# Patient Record
Sex: Female | Born: 1940 | Hispanic: No | Smoking: Never smoker
Health system: Southern US, Community
[De-identification: ages and names within clinical notes are randomized; demographics above are authoritative.]

## PROBLEM LIST (undated history)

## (undated) DIAGNOSIS — K5730 Diverticulosis of large intestine without perforation or abscess without bleeding: Secondary | ICD-10-CM

## (undated) DIAGNOSIS — E039 Hypothyroidism, unspecified: Secondary | ICD-10-CM

## (undated) DIAGNOSIS — F419 Anxiety disorder, unspecified: Secondary | ICD-10-CM

## (undated) DIAGNOSIS — J441 Chronic obstructive pulmonary disease with (acute) exacerbation: Secondary | ICD-10-CM

## (undated) DIAGNOSIS — M1990 Unspecified osteoarthritis, unspecified site: Secondary | ICD-10-CM

## (undated) DIAGNOSIS — I5189 Other ill-defined heart diseases: Secondary | ICD-10-CM

## (undated) DIAGNOSIS — I728 Aneurysm of other specified arteries: Secondary | ICD-10-CM

## (undated) DIAGNOSIS — R0989 Other specified symptoms and signs involving the circulatory and respiratory systems: Secondary | ICD-10-CM

## (undated) DIAGNOSIS — E119 Type 2 diabetes mellitus without complications: Secondary | ICD-10-CM

## (undated) DIAGNOSIS — E871 Hypo-osmolality and hyponatremia: Secondary | ICD-10-CM

## (undated) DIAGNOSIS — I69391 Dysphagia following cerebral infarction: Secondary | ICD-10-CM

## (undated) DIAGNOSIS — J349 Unspecified disorder of nose and nasal sinuses: Secondary | ICD-10-CM

## (undated) DIAGNOSIS — R1312 Dysphagia, oropharyngeal phase: Secondary | ICD-10-CM

## (undated) DIAGNOSIS — E079 Disorder of thyroid, unspecified: Secondary | ICD-10-CM

## (undated) DIAGNOSIS — I639 Cerebral infarction, unspecified: Secondary | ICD-10-CM

## (undated) DIAGNOSIS — I701 Atherosclerosis of renal artery: Secondary | ICD-10-CM

## (undated) DIAGNOSIS — I10 Essential (primary) hypertension: Secondary | ICD-10-CM

## (undated) DIAGNOSIS — E876 Hypokalemia: Secondary | ICD-10-CM

## (undated) DIAGNOSIS — G43909 Migraine, unspecified, not intractable, without status migrainosus: Secondary | ICD-10-CM

## (undated) DIAGNOSIS — E785 Hyperlipidemia, unspecified: Secondary | ICD-10-CM

## (undated) HISTORY — DX: Atherosclerosis of renal artery: I70.1

## (undated) HISTORY — DX: Anxiety disorder, unspecified: F41.9

## (undated) HISTORY — DX: Unspecified osteoarthritis, unspecified site: M19.90

## (undated) HISTORY — DX: Aneurysm of other specified arteries: I72.8

## (undated) HISTORY — DX: Cerebral infarction, unspecified: I63.9

## (undated) HISTORY — DX: Migraine, unspecified, not intractable, without status migrainosus: G43.909

## (undated) HISTORY — PX: CHOLECYSTECTOMY: SHX55

## (undated) HISTORY — DX: Other specified symptoms and signs involving the circulatory and respiratory systems: R09.89

## (undated) HISTORY — DX: Hypokalemia: E87.6

## (undated) HISTORY — DX: Hypothyroidism, unspecified: E03.9

## (undated) HISTORY — DX: Type 2 diabetes mellitus without complications: E11.9

## (undated) HISTORY — DX: Hypo-osmolality and hyponatremia: E87.1

## (undated) HISTORY — DX: Chronic obstructive pulmonary disease with (acute) exacerbation: J44.1

## (undated) HISTORY — PX: TONSILLECTOMY AND ADENOIDECTOMY: SHX28

## (undated) HISTORY — PX: COLONOSCOPY: SHX174

## (undated) HISTORY — DX: Dysphagia, oropharyngeal phase: R13.12

## (undated) HISTORY — DX: Essential (primary) hypertension: I10

## (undated) HISTORY — DX: Unspecified disorder of nose and nasal sinuses: J34.9

## (undated) HISTORY — DX: Dysphagia following cerebral infarction: I69.391

## (undated) HISTORY — DX: Diverticulosis of large intestine without perforation or abscess without bleeding: K57.30

## (undated) HISTORY — DX: Disorder of thyroid, unspecified: E07.9

## (undated) HISTORY — DX: Hyperlipidemia, unspecified: E78.5

## (undated) HISTORY — DX: Other ill-defined heart diseases: I51.89

## (undated) HISTORY — PX: CATARACT EXTRACTION: SUR2

## (undated) HISTORY — PX: EYE SURGERY: SHX253

## (undated) HISTORY — PX: BREAST SURGERY: SHX581

## (undated) HISTORY — PX: WISDOM TOOTH EXTRACTION: SHX21

---

## 2005-02-15 HISTORY — PX: BREAST REDUCTION SURGERY: SHX8

## 2013-01-25 ENCOUNTER — Ambulatory Visit: Payer: Self-pay

## 2013-03-01 ENCOUNTER — Ambulatory Visit (INDEPENDENT_AMBULATORY_CARE_PROVIDER_SITE_OTHER): Payer: Medicare Other

## 2013-03-01 VITALS — BP 161/79 | HR 65 | Resp 16 | Ht 62.0 in | Wt 138.0 lb

## 2013-03-01 DIAGNOSIS — E1149 Type 2 diabetes mellitus with other diabetic neurological complication: Secondary | ICD-10-CM

## 2013-03-01 DIAGNOSIS — E1142 Type 2 diabetes mellitus with diabetic polyneuropathy: Secondary | ICD-10-CM

## 2013-03-01 DIAGNOSIS — E1140 Type 2 diabetes mellitus with diabetic neuropathy, unspecified: Secondary | ICD-10-CM

## 2013-03-01 DIAGNOSIS — L608 Other nail disorders: Secondary | ICD-10-CM

## 2013-03-01 NOTE — Progress Notes (Signed)
   Subjective:    Patient ID: Brandy Flowers, female    DOB: 04/06/1940, 73 y.o.   MRN: [id number] Pt present for debridement of B/L 1-5 toenails. HPI    Review of Systems no other new problems. Brandy Flowers difficulties patient is having some difficulty with her blood pressure did changes in medications to     Objective:   Physical Exam Neurovascular status is intact and stable as follows pedal pulses DP +2/4 bilateral PT thready or absent bilateral absent hair growth and the skin texture and turgor noted neurologically epicritic and proprioceptive sensations are intact although diminished on Semmes Weinstein testing to the plantar forefoot and distal digits. There is normal plantar response DTRs not listed. Nails criptotic incurvated and somewhat friable there is no secondary infection no open wounds or ulcerations are noted digits are rectus no significant bunion deformity noted no open wounds or ulcers       Assessment & Plan:  Assessment diabetes diet-controlled with mild neuropathy. Multiple dystrophic criptotic nails debridement presence of diabetes and complications return for future palliative care is needed maintain accommodative shoes as instructed next  Alvan Dameichard Vail Vuncannon DPM

## 2013-03-01 NOTE — Patient Instructions (Signed)
Diabetes and Foot Care Diabetes may cause you to have problems because of poor blood supply (circulation) to your feet and legs. This may cause the skin on your feet to become thinner, break easier, and heal more slowly. Your skin may become dry, and the skin may peel and crack. You may also have nerve damage in your legs and feet causing decreased feeling in them. You may not notice minor injuries to your feet that could lead to infections or more serious problems. Taking care of your feet is one of the most important things you can do for yourself.  HOME CARE INSTRUCTIONS  Wear shoes at all times, even in the house. Do not go barefoot. Bare feet are easily injured.  Check your feet daily for blisters, cuts, and redness. If you cannot see the bottom of your feet, use a mirror or ask someone for help.  Wash your feet with warm water (do not use hot water) and mild soap. Then pat your feet and the areas between your toes until they are completely dry. Do not soak your feet as this can dry your skin.  Apply a moisturizing lotion or petroleum jelly (that does not contain alcohol and is unscented) to the skin on your feet and to dry, brittle toenails. Do not apply lotion between your toes.  Trim your toenails straight across. Do not dig under them or around the cuticle. File the edges of your nails with an emery board or nail file.  Do not cut corns or calluses or try to remove them with medicine.  Wear clean socks or stockings every day. Make sure they are not too tight. Do not wear knee-high stockings since they may decrease blood flow to your legs.  Wear shoes that fit properly and have enough cushioning. To break in new shoes, wear them for just a few hours a day. This prevents you from injuring your feet. Always look in your shoes before you put them on to be sure there are no objects inside.  Do not cross your legs. This may decrease the blood flow to your feet.  If you find a minor scrape,  cut, or break in the skin on your feet, keep it and the skin around it clean and dry. These areas may be cleansed with mild soap and water. Do not cleanse the area with peroxide, alcohol, or iodine.  When you remove an adhesive bandage, be sure not to damage the skin around it.  If you have a wound, look at it several times a day to make sure it is healing.  Do not use heating pads or hot water bottles. They may burn your skin. If you have lost feeling in your feet or legs, you may not know it is happening until it is too late.  Make sure your health care provider performs a complete foot exam at least annually or more often if you have foot problems. Report any cuts, sores, or bruises to your health care provider immediately. SEEK MEDICAL CARE IF:   You have an injury that is not healing.  You have cuts or breaks in the skin.  You have an ingrown nail.  You notice redness on your legs or feet.  You feel burning or tingling in your legs or feet.  You have pain or cramps in your legs and feet.  Your legs or feet are numb.  Your feet always feel cold. SEEK IMMEDIATE MEDICAL CARE IF:   There is increasing redness,   swelling, or pain in or around a wound.  There is a red line that goes up your leg.  Pus is coming from a wound.  You develop a fever or as directed by your health care provider.  You notice a bad smell coming from an ulcer or wound. Document Released: 01/30/2000 Document Revised: 10/04/2012 Document Reviewed: 07/11/2012 Exit[employer] Patient Information 2014 [employer].  

## 2013-05-31 ENCOUNTER — Ambulatory Visit (INDEPENDENT_AMBULATORY_CARE_PROVIDER_SITE_OTHER): Payer: Medicare Other

## 2013-05-31 VITALS — BP 149/71 | HR 60 | Resp 18

## 2013-05-31 DIAGNOSIS — L608 Other nail disorders: Secondary | ICD-10-CM

## 2013-05-31 DIAGNOSIS — E1140 Type 2 diabetes mellitus with diabetic neuropathy, unspecified: Secondary | ICD-10-CM

## 2013-05-31 DIAGNOSIS — E1149 Type 2 diabetes mellitus with other diabetic neurological complication: Secondary | ICD-10-CM

## 2013-05-31 NOTE — Patient Instructions (Signed)
Diabetes and Foot Care Diabetes may cause you to have problems because of poor blood supply (circulation) to your feet and legs. This may cause the skin on your feet to become thinner, break easier, and heal more slowly. Your skin may become dry, and the skin may peel and crack. You may also have nerve damage in your legs and feet causing decreased feeling in them. You may not notice minor injuries to your feet that could lead to infections or more serious problems. Taking care of your feet is one of the most important things you can do for yourself.  HOME CARE INSTRUCTIONS  Wear shoes at all times, even in the house. Do not go barefoot. Bare feet are easily injured.  Check your feet daily for blisters, cuts, and redness. If you cannot see the bottom of your feet, use a mirror or ask someone for help.  Wash your feet with warm water (do not use hot water) and mild soap. Then pat your feet and the areas between your toes until they are completely dry. Do not soak your feet as this can dry your skin.  Apply a moisturizing lotion or petroleum jelly (that does not contain alcohol and is unscented) to the skin on your feet and to dry, brittle toenails. Do not apply lotion between your toes.  Trim your toenails straight across. Do not dig under them or around the cuticle. File the edges of your nails with an emery board or nail file.  Do not cut corns or calluses or try to remove them with medicine.  Wear clean socks or stockings every day. Make sure they are not too tight. Do not wear knee-high stockings since they may decrease blood flow to your legs.  Wear shoes that fit properly and have enough cushioning. To break in new shoes, wear them for just a few hours a day. This prevents you from injuring your feet. Always look in your shoes before you put them on to be sure there are no objects inside.  Do not cross your legs. This may decrease the blood flow to your feet.  If you find a minor scrape,  cut, or break in the skin on your feet, keep it and the skin around it clean and dry. These areas may be cleansed with mild soap and water. Do not cleanse the area with peroxide, alcohol, or iodine.  When you remove an adhesive bandage, be sure not to damage the skin around it.  If you have a wound, look at it several times a day to make sure it is healing.  Do not use heating pads or hot water bottles. They may burn your skin. If you have lost feeling in your feet or legs, you may not know it is happening until it is too late.  Make sure your health care provider performs a complete foot exam at least annually or more often if you have foot problems. Report any cuts, sores, or bruises to your health care provider immediately. SEEK MEDICAL CARE IF:   You have an injury that is not healing.  You have cuts or breaks in the skin.  You have an ingrown nail.  You notice redness on your legs or feet.  You feel burning or tingling in your legs or feet.  You have pain or cramps in your legs and feet.  Your legs or feet are numb.  Your feet always feel cold. SEEK IMMEDIATE MEDICAL CARE IF:   There is increasing redness,   swelling, or pain in or around a wound.  There is a red line that goes up your leg.  Pus is coming from a wound.  You develop a fever or as directed by your health care provider.  You notice a bad smell coming from an ulcer or wound. Document Released: 01/30/2000 Document Revised: 10/04/2012 Document Reviewed: 07/11/2012 Exit[employer] Patient Information 2014 [employer].  

## 2013-05-31 NOTE — Progress Notes (Signed)
   Subjective:    Patient ID: Brandy Flowers, female    DOB: 10/14/1940, 73 y.o.   MRN: [id number]  HPI I am here to get my toenails trimmed up and to have my feet checked and I did have gall bladder surgery last wednesday    Review of Systems no systemic changes or findings noted    Objective:   Physical Exam Function objective findings as follows vascular status reveals pedal pulses DP +2 PT plus one over 4 bilateral refill time 3 seconds epicritic and proprioceptive sensations diminished on Semmes Weinstein testing. Nails criptotic incurvated and brittle tender no open wounds ulcerations noted no secondary infection is noted rectus foot type mild digital contractures noted       Assessment & Plan:  Assessment diabetes with history peripheral neuropathy multiple dystrophic nails debrided x10 the presence of diabetes and complications Neosporin and Band-Aid applied to the first right following debridement treated with lumicain Neosporin maintain accommodative shoes reappointed 3 months for palliative care  Alvan Dameichard Abella Shugart DPM

## 2013-08-30 ENCOUNTER — Ambulatory Visit: Payer: Medicare Other

## 2013-09-20 ENCOUNTER — Ambulatory Visit (INDEPENDENT_AMBULATORY_CARE_PROVIDER_SITE_OTHER): Payer: Medicare Other

## 2013-09-20 VITALS — BP 121/58 | HR 77 | Resp 18

## 2013-09-20 DIAGNOSIS — L608 Other nail disorders: Secondary | ICD-10-CM

## 2013-09-20 DIAGNOSIS — E1140 Type 2 diabetes mellitus with diabetic neuropathy, unspecified: Secondary | ICD-10-CM

## 2013-09-20 DIAGNOSIS — I10 Essential (primary) hypertension: Secondary | ICD-10-CM | POA: Insufficient documentation

## 2013-09-20 DIAGNOSIS — E039 Hypothyroidism, unspecified: Secondary | ICD-10-CM

## 2013-09-20 DIAGNOSIS — E1149 Type 2 diabetes mellitus with other diabetic neurological complication: Secondary | ICD-10-CM

## 2013-09-20 HISTORY — DX: Type 2 diabetes mellitus with diabetic neuropathy, unspecified: E11.40

## 2013-09-20 HISTORY — DX: Hypothyroidism, unspecified: E03.9

## 2013-09-20 NOTE — Progress Notes (Signed)
   Subjective:    Patient ID: Brandy GarnetBarbara Flowers, female    DOB: 10/06/1940, 73 y.o.   MRN: [id number]  HPI I AM HERE TO GET MY TOENAILS TRIMMED UP    Review of Systems new findings or systemic changes noted     Objective:   Physical Exam Lower extremity objective findings as follows vascular status is intact pedal pulses palpable DP +2/4 bilateral PT one over 4 bilateral Refill time 3 seconds. Epicritic sensations diminished on Semmes Weinstein testing to forefoot digits and arch. Nails criptotic incurvated and friable tender no open wounds ulcerations no secondary infections mild digital contractures noted bilateral.       Assessment & Plan:  Assessment diabetes with mild peripheral neuropathy nails thick criptotic incurvated friable with dystrophy. Nails debridement presence of diabetes and cocking factors return for future palliative care and as-needed basis suggest 3 month followup  Alvan Dameichard Wiley Magan DPM

## 2013-09-20 NOTE — Patient Instructions (Signed)
Diabetes and Foot Care Diabetes may cause you to have problems because of poor blood supply (circulation) to your feet and legs. This may cause the skin on your feet to become thinner, break easier, and heal more slowly. Your skin may become dry, and the skin may peel and crack. You may also have nerve damage in your legs and feet causing decreased feeling in them. You may not notice minor injuries to your feet that could lead to infections or more serious problems. Taking care of your feet is one of the most important things you can do for yourself.  HOME CARE INSTRUCTIONS  Wear shoes at all times, even in the house. Do not go barefoot. Bare feet are easily injured.  Check your feet daily for blisters, cuts, and redness. If you cannot see the bottom of your feet, use a mirror or ask someone for help.  Wash your feet with warm water (do not use hot water) and mild soap. Then pat your feet and the areas between your toes until they are completely dry. Do not soak your feet as this can dry your skin.  Apply a moisturizing lotion or petroleum jelly (that does not contain alcohol and is unscented) to the skin on your feet and to dry, brittle toenails. Do not apply lotion between your toes.  Trim your toenails straight across. Do not dig under them or around the cuticle. File the edges of your nails with an emery board or nail file.  Do not cut corns or calluses or try to remove them with medicine.  Wear clean socks or stockings every day. Make sure they are not too tight. Do not wear knee-high stockings since they may decrease blood flow to your legs.  Wear shoes that fit properly and have enough cushioning. To break in new shoes, wear them for just a few hours a day. This prevents you from injuring your feet. Always look in your shoes before you put them on to be sure there are no objects inside.  Do not cross your legs. This may decrease the blood flow to your feet.  If you find a minor scrape,  cut, or break in the skin on your feet, keep it and the skin around it clean and dry. These areas may be cleansed with mild soap and water. Do not cleanse the area with peroxide, alcohol, or iodine.  When you remove an adhesive bandage, be sure not to damage the skin around it.  If you have a wound, look at it several times a day to make sure it is healing.  Do not use heating pads or hot water bottles. They may burn your skin. If you have lost feeling in your feet or legs, you may not know it is happening until it is too late.  Make sure your health care provider performs a complete foot exam at least annually or more often if you have foot problems. Report any cuts, sores, or bruises to your health care provider immediately. SEEK MEDICAL CARE IF:   You have an injury that is not healing.  You have cuts or breaks in the skin.  You have an ingrown nail.  You notice redness on your legs or feet.  You feel burning or tingling in your legs or feet.  You have pain or cramps in your legs and feet.  Your legs or feet are numb.  Your feet always feel cold. SEEK IMMEDIATE MEDICAL CARE IF:   There is increasing redness,   swelling, or pain in or around a wound.  There is a red line that goes up your leg.  Pus is coming from a wound.  You develop a fever or as directed by your health care provider.  You notice a bad smell coming from an ulcer or wound. Document Released: 01/30/2000 Document Revised: 10/04/2012 Document Reviewed: 07/11/2012 Exit[employer] Patient Information 2015 [employer]. This information is not intended to replace advice given to you by your health care provider. Make sure you discuss any questions you have with your health care provider.  

## 2013-12-21 ENCOUNTER — Ambulatory Visit (INDEPENDENT_AMBULATORY_CARE_PROVIDER_SITE_OTHER): Payer: Medicare Other

## 2013-12-21 VITALS — BP 142/86 | HR 78 | Resp 12

## 2013-12-21 DIAGNOSIS — L603 Nail dystrophy: Secondary | ICD-10-CM

## 2013-12-21 DIAGNOSIS — E1140 Type 2 diabetes mellitus with diabetic neuropathy, unspecified: Secondary | ICD-10-CM

## 2013-12-21 NOTE — Patient Instructions (Signed)
Diabetes and Foot Care Diabetes may cause you to have problems because of poor blood supply (circulation) to your feet and legs. This may cause the skin on your feet to become thinner, break easier, and heal more slowly. Your skin may become dry, and the skin may peel and crack. You may also have nerve damage in your legs and feet causing decreased feeling in them. You may not notice minor injuries to your feet that could lead to infections or more serious problems. Taking care of your feet is one of the most important things you can do for yourself.  HOME CARE INSTRUCTIONS  Wear shoes at all times, even in the house. Do not go barefoot. Bare feet are easily injured.  Check your feet daily for blisters, cuts, and redness. If you cannot see the bottom of your feet, use a mirror or ask someone for help.  Wash your feet with warm water (do not use hot water) and mild soap. Then pat your feet and the areas between your toes until they are completely dry. Do not soak your feet as this can dry your skin.  Apply a moisturizing lotion or petroleum jelly (that does not contain alcohol and is unscented) to the skin on your feet and to dry, brittle toenails. Do not apply lotion between your toes.  Trim your toenails straight across. Do not dig under them or around the cuticle. File the edges of your nails with an emery board or nail file.  Do not cut corns or calluses or try to remove them with medicine.  Wear clean socks or stockings every day. Make sure they are not too tight. Do not wear knee-high stockings since they may decrease blood flow to your legs.  Wear shoes that fit properly and have enough cushioning. To break in new shoes, wear them for just a few hours a day. This prevents you from injuring your feet. Always look in your shoes before you put them on to be sure there are no objects inside.  Do not cross your legs. This may decrease the blood flow to your feet.  If you find a minor scrape,  cut, or break in the skin on your feet, keep it and the skin around it clean and dry. These areas may be cleansed with mild soap and water. Do not cleanse the area with peroxide, alcohol, or iodine.  When you remove an adhesive bandage, be sure not to damage the skin around it.  If you have a wound, look at it several times a day to make sure it is healing.  Do not use heating pads or hot water bottles. They may burn your skin. If you have lost feeling in your feet or legs, you may not know it is happening until it is too late.  Make sure your health care provider performs a complete foot exam at least annually or more often if you have foot problems. Report any cuts, sores, or bruises to your health care provider immediately. SEEK MEDICAL CARE IF:   You have an injury that is not healing.  You have cuts or breaks in the skin.  You have an ingrown nail.  You notice redness on your legs or feet.  You feel burning or tingling in your legs or feet.  You have pain or cramps in your legs and feet.  Your legs or feet are numb.  Your feet always feel cold. SEEK IMMEDIATE MEDICAL CARE IF:   There is increasing redness,   swelling, or pain in or around a wound.  There is a red line that goes up your leg.  Pus is coming from a wound.  You develop a fever or as directed by your health care provider.  You notice a bad smell coming from an ulcer or wound. Document Released: 01/30/2000 Document Revised: 10/04/2012 Document Reviewed: 07/11/2012 Exit[employer] Patient Information 2015 [employer]. This information is not intended to replace advice given to you by your health care provider. Make sure you discuss any questions you have with your health care provider.  

## 2013-12-21 NOTE — Progress Notes (Signed)
   Subjective:    Patient ID: Brandy GarnetBarbara Flowers, female    DOB: 01/05/1941, 73 y.o.   MRN: [id number]  HPI  Just trim my toenails.  Review of Systemsno new findings or systemic changes noted     Objective:   Physical Exam Lower extremity objective findings unchanged vascular status is intact pedal pulses DP +2 PT 1 over 4 bilateral capillary refill timed 3-4 seconds. Decreased sensation to the forefoot digits and arch nails criptotic incurvated some friability although greatly improved no open wounds or ulcers no secondary infections digital contractures are noted otherwise unremarkable biomechanical changes       Assessment & Plan:  Assessment diabetes with history peripheral neuropathy thick criptotic nails debrided 1 through 5 bilateral return for future palliative care every 3 months as recommended next  Alvan Dameichard Adden Strout DPM

## 2014-03-22 ENCOUNTER — Ambulatory Visit (INDEPENDENT_AMBULATORY_CARE_PROVIDER_SITE_OTHER): Payer: Medicare Other

## 2014-03-22 VITALS — BP 157/75 | HR 67 | Resp 18

## 2014-03-22 DIAGNOSIS — E1140 Type 2 diabetes mellitus with diabetic neuropathy, unspecified: Secondary | ICD-10-CM

## 2014-03-22 DIAGNOSIS — L603 Nail dystrophy: Secondary | ICD-10-CM

## 2014-03-22 NOTE — Progress Notes (Signed)
   Subjective:    Patient ID: Brandy Flowers, female    DOB: 07/26/1940, 74 y.o.   MRN: [id number]  HPI I NEED MY TOENAILS TRIMMED UP    Review of Systems no new findings or systemic changes     Objective:   Physical Exam Neurovascular status is intact and unchanged DP +2 PT plus one over 4 bilateral Refill timed 3-4 seconds all digits her A1c slightly 1 up from 5.9 6.1 however otherwise diabetes well-managed well-controlled no other complications noted decreased sensation noted nails thick criptotic incurvated no secondary infections no open wounds or ulcers are noted mild digital contractures 2 through 5 slight pinch callus the hallux lateral although no severe keratoses noted       Assessment & Plan:  Assessment diabetes with history peripheral neuropathy thick criptotic dystrophic nails debrided 1 through 5 bilateral the presence of diabetes and complications neuropathy will continue monitor on a regular basis recommended palliative care every 3 months next  Alvan Dameichard Orah Sonnen DPM

## 2014-03-22 NOTE — Patient Instructions (Signed)
Diabetes and Foot Care Diabetes may cause you to have problems because of poor blood supply (circulation) to your feet and legs. This may cause the skin on your feet to become thinner, break easier, and heal more slowly. Your skin may become dry, and the skin may peel and crack. You may also have nerve damage in your legs and feet causing decreased feeling in them. You may not notice minor injuries to your feet that could lead to infections or more serious problems. Taking care of your feet is one of the most important things you can do for yourself.  HOME CARE INSTRUCTIONS  Wear shoes at all times, even in the house. Do not go barefoot. Bare feet are easily injured.  Check your feet daily for blisters, cuts, and redness. If you cannot see the bottom of your feet, use a mirror or ask someone for help.  Wash your feet with warm water (do not use hot water) and mild soap. Then pat your feet and the areas between your toes until they are completely dry. Do not soak your feet as this can dry your skin.  Apply a moisturizing lotion or petroleum jelly (that does not contain alcohol and is unscented) to the skin on your feet and to dry, brittle toenails. Do not apply lotion between your toes.  Trim your toenails straight across. Do not dig under them or around the cuticle. File the edges of your nails with an emery board or nail file.  Do not cut corns or calluses or try to remove them with medicine.  Wear clean socks or stockings every day. Make sure they are not too tight. Do not wear knee-high stockings since they may decrease blood flow to your legs.  Wear shoes that fit properly and have enough cushioning. To break in new shoes, wear them for just a few hours a day. This prevents you from injuring your feet. Always look in your shoes before you put them on to be sure there are no objects inside.  Do not cross your legs. This may decrease the blood flow to your feet.  If you find a minor scrape,  cut, or break in the skin on your feet, keep it and the skin around it clean and dry. These areas may be cleansed with mild soap and water. Do not cleanse the area with peroxide, alcohol, or iodine.  When you remove an adhesive bandage, be sure not to damage the skin around it.  If you have a wound, look at it several times a day to make sure it is healing.  Do not use heating pads or hot water bottles. They may burn your skin. If you have lost feeling in your feet or legs, you may not know it is happening until it is too late.  Make sure your health care provider performs a complete foot exam at least annually or more often if you have foot problems. Report any cuts, sores, or bruises to your health care provider immediately. SEEK MEDICAL CARE IF:   You have an injury that is not healing.  You have cuts or breaks in the skin.  You have an ingrown nail.  You notice redness on your legs or feet.  You feel burning or tingling in your legs or feet.  You have pain or cramps in your legs and feet.  Your legs or feet are numb.  Your feet always feel cold. SEEK IMMEDIATE MEDICAL CARE IF:   There is increasing redness,   swelling, or pain in or around a wound.  There is a red line that goes up your leg.  Pus is coming from a wound.  You develop a fever or as directed by your health care provider.  You notice a bad smell coming from an ulcer or wound. Document Released: 01/30/2000 Document Revised: 10/04/2012 Document Reviewed: 07/11/2012 Exit[employer] Patient Information 2015 [employer]. This information is not intended to replace advice given to you by your health care provider. Make sure you discuss any questions you have with your health care provider.  

## 2014-06-21 ENCOUNTER — Ambulatory Visit (INDEPENDENT_AMBULATORY_CARE_PROVIDER_SITE_OTHER): Payer: Medicare Other | Admitting: Podiatrist

## 2014-06-21 ENCOUNTER — Encounter: Payer: Self-pay | Admitting: Podiatrist

## 2014-06-21 ENCOUNTER — Ambulatory Visit: Payer: Medicare Other | Admitting: Podiatrist

## 2014-06-21 VITALS — BP 149/83 | HR 64 | Resp 18

## 2014-06-21 DIAGNOSIS — M79676 Pain in unspecified toe(s): Secondary | ICD-10-CM | POA: Diagnosis not present

## 2014-06-21 DIAGNOSIS — E1140 Type 2 diabetes mellitus with diabetic neuropathy, unspecified: Secondary | ICD-10-CM

## 2014-06-21 DIAGNOSIS — B351 Tinea unguium: Secondary | ICD-10-CM | POA: Diagnosis not present

## 2014-06-21 DIAGNOSIS — L603 Nail dystrophy: Secondary | ICD-10-CM

## 2014-06-21 NOTE — Progress Notes (Signed)
HPI: Patient presents today for follow up of diabetic foot and nail care. Past medical history, meds, and allergies reviewed. Patient states blood sugar is under good  control with diet.   Objective:   Objective:  Patients chart is reviewed.  Vascular status reveals pedal pulses noted at  2 out of 4 dp and pt bilateral .  Neurological sensation is slightly decreased at the distal digits to [facility] monofilament bilateral at 5/5 sites bilateral.  Dermatological exam reveals  absence of pre ulcerative/ hyperkeratotic lesions.   Toenails are elongated, incurvated, discolored, dystrophic with ingrown deformity present.    Assessment: Diabetes , symptomatic mycotic nails  Plan: Discussed treatment options and alternatives. Debrided nails without complication. She uses a goats milk sugar scrub on her feet that keep them soft and moisturized-- [id number].  We discussed the scrub at today's visit.

## 2014-09-30 ENCOUNTER — Ambulatory Visit: Payer: Medicare Other | Admitting: Podiatry

## 2014-10-14 ENCOUNTER — Encounter: Payer: Self-pay | Admitting: Podiatry

## 2014-10-14 ENCOUNTER — Ambulatory Visit (INDEPENDENT_AMBULATORY_CARE_PROVIDER_SITE_OTHER): Payer: Medicare Other | Admitting: Podiatry

## 2014-10-14 DIAGNOSIS — M79676 Pain in unspecified toe(s): Secondary | ICD-10-CM | POA: Diagnosis not present

## 2014-10-14 DIAGNOSIS — B351 Tinea unguium: Secondary | ICD-10-CM | POA: Diagnosis not present

## 2014-10-14 DIAGNOSIS — E1140 Type 2 diabetes mellitus with diabetic neuropathy, unspecified: Secondary | ICD-10-CM | POA: Diagnosis not present

## 2014-10-14 DIAGNOSIS — L603 Nail dystrophy: Secondary | ICD-10-CM | POA: Diagnosis not present

## 2014-10-14 NOTE — Progress Notes (Signed)
Patient ID: Brandy Flowers, female   DOB: Mar 15, 1940, 74 y.o.   MRN: [id number] Complaint:  Visit Type: Patient returns to my office for continued preventative foot care services. Complaint: Patient states" my nails have grown long and thick and become painful to walk and wear shoes" Patient has been diagnosed with DM with no foot complications. The patient presents for preventative foot care services. No changes to ROS  Podiatric Exam: Vascular: dorsalis pedis and posterior tibial pulses are palpable bilateral. Capillary return is immediate. Temperature gradient is WNL. Skin turgor WNL  Sensorium: Normal Semmes Weinstein monofilament test. Normal tactile sensation bilaterally. Nail Exam: Pt has thick disfigured discolored nails with subungual debris noted bilateral entire nail hallux through fifth toenails Ulcer Exam: There is no evidence of ulcer or pre-ulcerative changes or infection. Orthopedic Exam: Muscle tone and strength are WNL. No limitations in general ROM. No crepitus or effusions noted. Foot type and digits show no abnormalities. Bony prominences are unremarkable. Skin: No Porokeratosis. No infection or ulcers  Diagnosis:  Onychomycosis, , Pain in right toe, pain in left toes  Treatment & Plan Procedures and Treatment: Consent by patient was obtained for treatment procedures. The patient understood the discussion of treatment and procedures well. All questions were answered thoroughly reviewed. Debridement of mycotic and hypertrophic toenails, 1 through 5 bilateral and clearing of subungual debris. No ulceration, no infection noted.  Return Visit-Office Procedure: Patient instructed to return to the office for a follow up visit 3 months for continued evaluation and treatment.

## 2014-12-03 ENCOUNTER — Encounter: Payer: Self-pay | Admitting: Vascular Surgery

## 2014-12-05 ENCOUNTER — Encounter: Payer: Self-pay | Admitting: Vascular Surgery

## 2014-12-05 ENCOUNTER — Ambulatory Visit (INDEPENDENT_AMBULATORY_CARE_PROVIDER_SITE_OTHER): Payer: Medicare Other | Admitting: Vascular Surgery

## 2014-12-05 VITALS — BP 140/49 | HR 60 | Temp 97.9°F | Resp 16 | Ht 61.0 in | Wt 138.0 lb

## 2014-12-05 DIAGNOSIS — I728 Aneurysm of other specified arteries: Secondary | ICD-10-CM

## 2014-12-05 DIAGNOSIS — I701 Atherosclerosis of renal artery: Secondary | ICD-10-CM

## 2014-12-05 HISTORY — DX: Atherosclerosis of renal artery: I70.1

## 2014-12-05 NOTE — Progress Notes (Signed)
[facility] HISTORY AND PHYSICAL   History of Present Illness:  Patient is a 74 y.o. year old female who presents for evaluation of renal artery stenosis. The patient has a long history of hypertension. She states that her hypertension is poorly controlled when she becomes anxious. She states that routinely she will have blood pressures of greater than 200 systolic. She states this is usually improves after about an hour after taking Ativan. She is on Tenormin 100 mg once daily. She has never more than one blood pressure medication. She has no history of renal dysfunction. 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 She denies any abdominal pain..  Other medical problems include anxiety, diabetes, arthritis all of which are currently controlled.  Past Medical History  Diagnosis Date  . Sinus problem   . Arthritis   . Thyroid disease   . Diabetes mellitus without complication (HCC)   . Hypertension   . Anxiety     Past Surgical History  Procedure Laterality Date  . Eye surgery    . Breast surgery      Social History Social History  Substance Use Topics  . Smoking status: Never Smoker   . Smokeless tobacco: Never Used  . Alcohol Use: No    Family History Family History  Problem Relation Age of Onset  . Stroke Mother   . Diabetes Mother   . Heart disease Mother     Before age 66  . Hypertension Mother   . Hyperlipidemia Mother   . Heart disease Father   . Heart attack Father   . Heart disease Brother     After age 81- CABG  . Hypertension Brother     Allergies  Allergies  Allergen Reactions  . Ace Inhibitors Nausea And Vomiting  . Norvasc [Amlodipine] Nausea And Vomiting  . Blue Dyes (Parenteral)   . Sulfa Antibiotics      Current Outpatient Prescriptions  Medication Sig Dispense Refill  . acetaminophen-codeine (TYLENOL #3) 300-30 MG per tablet Take by mouth as needed for  moderate pain.     Marland Kitchen atenolol (TENORMIN) 100 MG tablet Take 100 mg by mouth daily.    Marland Kitchen levothyroxine (SYNTHROID, LEVOTHROID) 75 MCG tablet Take 100 mcg by mouth daily before breakfast.     . lidocaine (LIDODERM) 5 %     . LORazepam (ATIVAN) 1 MG tablet Take 1 mg by mouth 3 (three) times daily as needed.  5  . meloxicam (MOBIC) 15 MG tablet Take 15 mg by mouth daily.    Marland Kitchen aMILoride (MIDAMOR) 5 MG tablet   3  . amoxicillin (AMOXIL) 500 MG capsule     . amoxicillin-clavulanate (AUGMENTIN) 875-125 MG per tablet     . atenolol (TENORMIN) 50 MG tablet Take 50 mg by mouth 2 (two) times daily.  4  . azithromycin (ZITHROMAX) 250 MG tablet     . escitalopram (LEXAPRO) 5 MG tablet     . ketoconazole (NIZORAL) 2 % cream     . LORazepam (ATIVAN) 0.5 MG tablet Take 0.5 mg by mouth every 8 (eight) hours.    Marland Kitchen oxyCODONE (OXY IR/ROXICODONE) 5 MG immediate release tablet     . predniSONE (STERAPRED UNI-PAK) 5 MG TABS tablet     . spironolactone (ALDACTONE) 25 MG tablet     . tiZANidine (ZANAFLEX) 2 MG tablet     . triamcinolone cream (KENALOG) 0.1 %  No current facility-administered medications for this visit.    ROS:   General:  No weight loss, Fever, chills  HEENT: No recent headaches, no nasal bleeding, no visual changes, no sore throat  Neurologic: No dizziness, blackouts, seizures. No recent symptoms of stroke or mini- stroke. No recent episodes of slurred speech, or temporary blindness.  Cardiac: No recent episodes of chest pain/pressure, no shortness of breath at rest.  No shortness of breath with exertion.  Denies history of atrial fibrillation or irregular heartbeat  Vascular: No history of rest pain in feet.  No history of claudication.  No history of non-healing ulcer, No history of DVT   Pulmonary: No home oxygen, no productive cough, no hemoptysis,  No asthma or wheezing  Musculoskeletal:  [x ] Arthritis, [ ]  Low back pain,  [ ]  Joint pain  Hematologic:No history of  hypercoagulable state.  No history of easy bleeding.  No history of anemia  Gastrointestinal: No hematochezia or melena,  No gastroesophageal reflux, no trouble swallowing  Urinary: [ ]  chronic Kidney disease, [ ]  on HD - [ ]  MWF or [ ]  TTHS, [ ]  Burning with urination, [ ]  Frequent urination, [ ]  Difficulty urinating;   Skin: No rashes  Psychological: No history of anxiety,  No history of depression   Physical Examination  Filed Vitals:   12/05/14 1014 12/05/14 1026  BP: 148/53 140/49  Pulse: 64 60  Temp: 97.9 F (36.6 C)   TempSrc: Oral   Resp: 16   Height: 5\' 1"  (1.549 m)   Weight: 138 lb (62.596 kg)   SpO2: 99%     Body mass index is 26.09 kg/(m^2).  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 She currently does not have indication for intervention on this. The stenosis overall is fairly mild. She currently is only on one blood pressure medication and her spikes in blood pressure seemed to be related more to anxiety than essential hypertension. I have told her to check her blood pressure only once daily to avoid anxiety associated with checking a 7 or 8 times a day like she is currently doing. She will keep a log of this. I reassured her that a 7 mm splenic artery aneurysm at her age probably will not ever need to be addressed but we could follow this long-term. Indication to consider repair of this would be 2 cm.   PLAN:  Follow-up 1  year with repeat CT scan of the abdomen and pelvis at that point. She'll follow-up sooner she has declined in her renal function or requires additional antihypertensive medications up to the number of 3   Fabienne Brunsharles Nury Nebergall, MD [facility] [place] Office: [phone] Pager: [phone]

## 2014-12-05 NOTE — Progress Notes (Signed)
Filed Vitals:   12/05/14 1014 12/05/14 1026  BP: 148/53 140/49  Pulse: 64 60  Temp: 97.9 F (36.6 C)   TempSrc: Oral   Resp: 16   Height: 5\' 1"  (1.549 m)   Weight: 138 lb (62.596 kg)   SpO2: 99%

## 2014-12-09 ENCOUNTER — Encounter: Payer: Self-pay | Admitting: Internal Medicine

## 2014-12-10 NOTE — Addendum Note (Signed)
Addended by: Adria DillELDRIDGE-LEWIS, Ricardo Schubach L on: 12/10/2014 05:15 PM   Modules accepted: Orders

## 2015-01-13 ENCOUNTER — Ambulatory Visit (INDEPENDENT_AMBULATORY_CARE_PROVIDER_SITE_OTHER): Payer: Medicare Other | Admitting: Podiatry

## 2015-01-13 ENCOUNTER — Encounter: Payer: Self-pay | Admitting: Podiatry

## 2015-01-13 DIAGNOSIS — E1140 Type 2 diabetes mellitus with diabetic neuropathy, unspecified: Secondary | ICD-10-CM

## 2015-01-13 DIAGNOSIS — B351 Tinea unguium: Secondary | ICD-10-CM | POA: Diagnosis not present

## 2015-01-13 DIAGNOSIS — M79676 Pain in unspecified toe(s): Secondary | ICD-10-CM

## 2015-01-13 NOTE — Progress Notes (Signed)
Patient ID: Brandy GarnetBarbara Flowers, female   DOB: 06/21/1940, 74 y.o.   MRN: [id number] Complaint:  Visit Type: Patient returns to my office for continued preventative foot care services. Complaint: Patient states" my nails have grown long and thick and become painful to walk and wear shoes" Patient has been diagnosed with DM with no foot complications. The patient presents for preventative foot care services. No changes to ROS  Podiatric Exam: Vascular: dorsalis pedis and posterior tibial pulses are palpable bilateral. Capillary return is immediate. Temperature gradient is WNL. Skin turgor WNL  Sensorium: Normal Semmes Weinstein monofilament test. Normal tactile sensation bilaterally. Nail Exam: Pt has thick disfigured discolored nails with subungual debris noted bilateral entire nail hallux through fifth toenails Ulcer Exam: There is no evidence of ulcer or pre-ulcerative changes or infection. Orthopedic Exam: Muscle tone and strength are WNL. No limitations in general ROM. No crepitus or effusions noted. Foot type and digits show no abnormalities. Bony prominences are unremarkable. Skin: No Porokeratosis. No infection or ulcers  Diagnosis:  Onychomycosis, , Pain in right toe, pain in left toes  Treatment & Plan Procedures and Treatment: Consent by patient was obtained for treatment procedures. The patient understood the discussion of treatment and procedures well. All questions were answered thoroughly reviewed. Debridement of mycotic and hypertrophic toenails, 1 through 5 bilateral and clearing of subungual debris. No ulceration, no infection noted.  Return Visit-Office Procedure: Patient instructed to return to the office for a follow up visit 3 months for continued evaluation and treatment.  Brandy GuntherGregory Sabastien Flowers DPM

## 2015-04-07 ENCOUNTER — Ambulatory Visit (INDEPENDENT_AMBULATORY_CARE_PROVIDER_SITE_OTHER): Payer: Medicare Other | Admitting: Podiatry

## 2015-04-07 ENCOUNTER — Encounter: Payer: Self-pay | Admitting: Podiatry

## 2015-04-07 DIAGNOSIS — E1140 Type 2 diabetes mellitus with diabetic neuropathy, unspecified: Secondary | ICD-10-CM

## 2015-04-07 DIAGNOSIS — M79676 Pain in unspecified toe(s): Secondary | ICD-10-CM | POA: Diagnosis not present

## 2015-04-07 DIAGNOSIS — B351 Tinea unguium: Secondary | ICD-10-CM

## 2015-04-07 NOTE — Progress Notes (Signed)
Patient ID: Brandy Flowers, female   DOB: 01/26/1941, 74 y.o.   MRN: [id number] Complaint:  Visit Type: Patient returns to my office for continued preventative foot care services. Complaint: Patient states" my nails have grown long and thick and become painful to walk and wear shoes" Patient has been diagnosed with DM with no foot complications. The patient presents for preventative foot care services. No changes to ROS  Podiatric Exam: Vascular: dorsalis pedis and posterior tibial pulses are palpable bilateral. Capillary return is immediate. Temperature gradient is WNL. Skin turgor WNL  Sensorium: Normal Semmes Weinstein monofilament test. Normal tactile sensation bilaterally. Nail Exam: Pt has thick disfigured discolored nails with subungual debris noted bilateral entire nail hallux through fifth toenails Ulcer Exam: There is no evidence of ulcer or pre-ulcerative changes or infection. Orthopedic Exam: Muscle tone and strength are WNL. No limitations in general ROM. No crepitus or effusions noted. Foot type and digits show no abnormalities. Bony prominences are unremarkable. Skin: No Porokeratosis. No infection or ulcers  Diagnosis:  Onychomycosis, , Pain in right toe, pain in left toes  Treatment & Plan Procedures and Treatment: Consent by patient was obtained for treatment procedures. The patient understood the discussion of treatment and procedures well. All questions were answered thoroughly reviewed. Debridement of mycotic and hypertrophic toenails, 1 through 5 bilateral and clearing of subungual debris. No ulceration, no infection noted.  Return Visit-Office Procedure: Patient instructed to return to the office for a follow up visit 3 months for continued evaluation and treatment.  Syre Knerr DPM 

## 2015-04-21 ENCOUNTER — Ambulatory Visit: Payer: Medicare Other | Admitting: Podiatry

## 2015-04-28 ENCOUNTER — Ambulatory Visit: Payer: Medicare Other | Admitting: Podiatry

## 2015-06-09 ENCOUNTER — Ambulatory Visit: Payer: Medicare Other | Admitting: Podiatry

## 2015-06-23 ENCOUNTER — Encounter: Payer: Self-pay | Admitting: Podiatry

## 2015-06-23 ENCOUNTER — Ambulatory Visit (INDEPENDENT_AMBULATORY_CARE_PROVIDER_SITE_OTHER): Payer: Medicare Other | Admitting: Podiatry

## 2015-06-23 DIAGNOSIS — B351 Tinea unguium: Secondary | ICD-10-CM

## 2015-06-23 DIAGNOSIS — E1140 Type 2 diabetes mellitus with diabetic neuropathy, unspecified: Secondary | ICD-10-CM | POA: Diagnosis not present

## 2015-06-23 DIAGNOSIS — M79676 Pain in unspecified toe(s): Secondary | ICD-10-CM | POA: Diagnosis not present

## 2015-06-23 NOTE — Progress Notes (Signed)
Patient ID: Brandy Flowers, female   DOB: 05/29/1940, 74 y.o.   MRN: [id number] Complaint:  Visit Type: Patient returns to my office for continued preventative foot care services. Complaint: Patient states" my nails have grown long and thick and become painful to walk and wear shoes" Patient has been diagnosed with DM with no foot complications. The patient presents for preventative foot care services. No changes to ROS  Podiatric Exam: Vascular: dorsalis pedis and posterior tibial pulses are palpable bilateral. Capillary return is immediate. Temperature gradient is WNL. Skin turgor WNL  Sensorium: Normal Semmes Weinstein monofilament test. Normal tactile sensation bilaterally. Nail Exam: Pt has thick disfigured discolored nails with subungual debris noted bilateral entire nail hallux through fifth toenails Ulcer Exam: There is no evidence of ulcer or pre-ulcerative changes or infection. Orthopedic Exam: Muscle tone and strength are WNL. No limitations in general ROM. No crepitus or effusions noted. Foot type and digits show no abnormalities. Bony prominences are unremarkable. Skin: No Porokeratosis. No infection or ulcers  Diagnosis:  Onychomycosis, , Pain in right toe, pain in left toes  Treatment & Plan Procedures and Treatment: Consent by patient was obtained for treatment procedures. The patient understood the discussion of treatment and procedures well. All questions were answered thoroughly reviewed. Debridement of mycotic and hypertrophic toenails, 1 through 5 bilateral and clearing of subungual debris. No ulceration, no infection noted.  Return Visit-Office Procedure: Patient instructed to return to the office for a follow up visit 3 months for continued evaluation and treatment.  Jazleen Robeck DPM 

## 2015-09-22 ENCOUNTER — Encounter: Payer: Self-pay | Admitting: Podiatry

## 2015-09-22 ENCOUNTER — Ambulatory Visit (INDEPENDENT_AMBULATORY_CARE_PROVIDER_SITE_OTHER): Payer: Medicare Other | Admitting: Podiatry

## 2015-09-22 DIAGNOSIS — M79676 Pain in unspecified toe(s): Secondary | ICD-10-CM | POA: Diagnosis not present

## 2015-09-22 DIAGNOSIS — E1140 Type 2 diabetes mellitus with diabetic neuropathy, unspecified: Secondary | ICD-10-CM | POA: Diagnosis not present

## 2015-09-22 DIAGNOSIS — B351 Tinea unguium: Secondary | ICD-10-CM | POA: Diagnosis not present

## 2015-09-22 NOTE — Progress Notes (Signed)
Patient ID: Brandy GarnetBarbara Flowers, female   DOB: 04/16/1940, 75 y.o.   MRN: [id number] Complaint:  Visit Type: Patient returns to my office for continued preventative foot care services. Complaint: Patient states" my nails have grown long and thick and become painful to walk and wear shoes" Patient has been diagnosed with DM with no foot complications. The patient presents for preventative foot care services. No changes to ROS  Podiatric Exam: Vascular: dorsalis pedis and posterior tibial pulses are palpable bilateral. Capillary return is immediate. Temperature gradient is WNL. Hair on digits Sensorium: Normal Semmes Weinstein monofilament test. Normal tactile sensation bilaterally. Nail Exam: Pt has thick disfigured discolored nails with subungual debris noted bilateral entire nail hallux through fifth toenails Ulcer Exam: There is no evidence of ulcer or pre-ulcerative changes or infection. Orthopedic Exam: Muscle tone and strength are WNL. No limitations in general ROM. No crepitus or effusions noted. Foot type and digits show no abnormalities.  HAV  B/L Skin: No Porokeratosis. No infection or ulcers  Diagnosis:  Onychomycosis, , Pain in right toe, pain in left toes  Treatment & Plan Procedures and Treatment: Consent by patient was obtained for treatment procedures. The patient understood the discussion of treatment and procedures well. All questions were answered thoroughly reviewed. Debridement of mycotic and hypertrophic toenails, 1 through 5 bilateral and clearing of subungual debris. No ulceration, no infection noted.  Return Visit-Office Procedure: Patient instructed to return to the office for a follow up visit 3 months for continued evaluation and treatment.  Helane GuntherGregory Shavell Nored DPM

## 2015-09-29 ENCOUNTER — Ambulatory Visit: Payer: Medicare Other | Admitting: Podiatry

## 2015-10-31 ENCOUNTER — Encounter: Payer: Self-pay | Admitting: Vascular Surgery

## 2015-11-06 ENCOUNTER — Ambulatory Visit (INDEPENDENT_AMBULATORY_CARE_PROVIDER_SITE_OTHER): Payer: Medicare Other | Admitting: Vascular Surgery

## 2015-11-06 ENCOUNTER — Encounter: Payer: Self-pay | Admitting: Vascular Surgery

## 2015-11-06 VITALS — BP 180/88 | HR 79 | Temp 97.8°F | Resp 16 | Ht 62.0 in | Wt 141.0 lb

## 2015-11-06 DIAGNOSIS — I701 Atherosclerosis of renal artery: Secondary | ICD-10-CM

## 2015-11-06 NOTE — Addendum Note (Signed)
Addended by: Yolonda KidaEVANS, Ellianna Ruest N on: 11/06/2015 03:42 PM   Modules accepted: Orders

## 2015-11-06 NOTE — Progress Notes (Signed)
Vitals:   11/06/15 1301  BP: (!) 178/86  Pulse: 82  Resp: 16  Temp: 97.8 F (36.6 C)  SpO2: 96%  Weight: 141 lb (64 kg)  Height: 5\' 2"  (1.575 m)

## 2015-11-06 NOTE — Progress Notes (Signed)
[facility] [place] HISTORY AND PHYSICAL    History of Present Illness:  Patient is a 75 y.o. year old female who presents for evaluation of renal artery stenosis. He was seen for the same problem one year ago. The patient has a long history of hypertension. She states that her hypertension is poorly controlled when she becomes anxious. She states that routinely she will have blood pressures of greater than 200 systolic. She states this is usually improves after about an hour after taking Ativan. She is on Tenormin 100 mg once daily. She has never more than one blood pressure medication. She has no history of renal dysfunction. 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 [facility] on 10/10/2015. This was essentially unchanged from one year ago. She denies any abdominal pain..  Other medical problems include anxiety, diabetes, arthritis all of which are currently controlled.       Past Medical History  Diagnosis Date  . Sinus problem    . Arthritis    . Thyroid disease    . Diabetes mellitus without complication (HCC)    . Hypertension    . Anxiety             Past Surgical History  Procedure Laterality Date  . Eye surgery      . Breast surgery          Social History     Social History  Substance Use Topics  . Smoking status: Never Smoker   . Smokeless tobacco: Never Used  . Alcohol Use: No      Family History       Family History  Problem Relation Age of Onset  . Stroke Mother    . Diabetes Mother    . Heart disease Mother        Before age 74  . Hypertension Mother    . Hyperlipidemia Mother    . Heart disease Father    . Heart attack Father    . Heart disease Brother        After age 660- CABG  . Hypertension Brother        Allergies       Allergies  Allergen Reactions  . Ace Inhibitors Nausea And Vomiting  . Norvasc [Amlodipine] Nausea  And Vomiting  . Blue Dyes (Parenteral)    . Sulfa Antibiotics      Current Outpatient Prescriptions on File Prior to Visit  Medication Sig Dispense Refill  . acetaminophen-codeine (TYLENOL #3) 300-30 MG per tablet Take by mouth as needed for moderate pain.     Marland Kitchen. atenolol (TENORMIN) 100 MG tablet Take 100 mg by mouth daily.    Marland Kitchen. levothyroxine (SYNTHROID, LEVOTHROID) 75 MCG tablet Take 100 mcg by mouth daily before breakfast.     . lidocaine (LIDODERM) 5 %     . LORazepam (ATIVAN) 1 MG tablet Take 1 mg by mouth 3 (three) times daily as needed.  5  . meloxicam (MOBIC) 15 MG tablet Take 15 mg by mouth daily.    Marland Kitchen. aMILoride (MIDAMOR) 5 MG tablet   3  . amoxicillin (AMOXIL) 500 MG capsule     . amoxicillin-clavulanate (AUGMENTIN) 875-125 MG per tablet     . azithromycin (ZITHROMAX) 250 MG tablet     . escitalopram (LEXAPRO) 5 MG tablet     . ketoconazole (NIZORAL) 2 % cream     .  oxyCODONE (OXY IR/ROXICODONE) 5 MG immediate release tablet     . predniSONE (STERAPRED UNI-PAK) 5 MG TABS tablet     . spironolactone (ALDACTONE) 25 MG tablet     . tiZANidine (ZANAFLEX) 2 MG tablet     . triamcinolone cream (KENALOG) 0.1 %      No current facility-administered medications on file prior to visit.      ROS:     Musculoskeletal:  [x ] Arthritis, [ ]  Low back pain,  [ ]  Joint pain    Urinary: [ ]  chronic Kidney disease, [ ]  on HD - [ ]  MWF or [ ]  TTHS, [ ]  Burning with urination, [ ]  Frequent urination, [ ]  Difficulty urinating;    Psychological: + history of anxiety,  No history of depression     Physical Examination   Vitals:   11/06/15 1301 11/06/15 1305  BP: (!) 178/86 (!) 180/88  Pulse: 82 79  Resp: 16   Temp: 97.8 F (36.6 C)   SpO2: 96%   Weight: 141 lb (64 kg)   Height: 5\' 2"  (1.575 m)      General:  Alert and oriented, no acute distress HEENT: Normal Neck: No bruit or JVD Pulmonary: Clear to auscultation bilaterally Cardiac: Regular Rate and Rhythm without  murmur Abdomen: Soft, non-tender, non-distended, no mass, no bruit Skin: No rash Extremity Pulses:  2+ radial, brachial, femoral, dorsalis pedis, posterior tibial pulses bilaterally Musculoskeletal: No deformity or edema      Neurologic: Upper and lower extremity motor 5/5 and symmetric   DATA:  CT scan reviewed 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 I do not really see any stenosis in the right renal artery.     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 She currently does not have indication for intervention on this. The stenosis overall is fairly mild. She currently is only on one blood pressure medication and her spikes in blood pressure seemed to be related more to anxiety than essential hypertension. She will keep a log of her blood pressure. She states that currently her blood pressure usually runs in the 140s. She was in the 170s today in our office.. I reassured her that a 7 mm splenic artery aneurysm at her age probably will not ever need to be addressed but we could follow this long-term. Indication to consider repair of this would be 2 cm.     PLAN:  Follow-up 1 year with renal duplex ultrasound in our office. She'll follow-up sooner she has declined in her renal function or requires additional antihypertensive medications up to the number of 3    Fabienne Bruns, MD [facility] [place] Office: [phone] Pager: [phone]

## 2015-11-11 ENCOUNTER — Encounter: Payer: Self-pay | Admitting: Internal Medicine

## 2015-12-01 ENCOUNTER — Encounter: Payer: Self-pay | Admitting: Podiatry

## 2015-12-01 ENCOUNTER — Ambulatory Visit (INDEPENDENT_AMBULATORY_CARE_PROVIDER_SITE_OTHER): Payer: Medicare Other | Admitting: Podiatry

## 2015-12-01 VITALS — Ht 62.0 in | Wt 141.0 lb

## 2015-12-01 DIAGNOSIS — B351 Tinea unguium: Secondary | ICD-10-CM | POA: Diagnosis not present

## 2015-12-01 DIAGNOSIS — E1140 Type 2 diabetes mellitus with diabetic neuropathy, unspecified: Secondary | ICD-10-CM

## 2015-12-01 DIAGNOSIS — M79676 Pain in unspecified toe(s): Secondary | ICD-10-CM

## 2015-12-01 NOTE — Progress Notes (Signed)
Patient ID: Brandy GarnetBarbara Flowers, female   DOB: 03/05/1940, 75 y.o.   MRN: [id number] Complaint:  Visit Type: Patient returns to my office for continued preventative foot care services. Complaint: Patient states" my nails have grown long and thick and become painful to walk and wear shoes" Patient has been diagnosed with DM with no foot complications. The patient presents for preventative foot care services. No changes to ROS  Podiatric Exam: Vascular: dorsalis pedis and posterior tibial pulses are palpable bilateral. Capillary return is immediate. Temperature gradient is WNL. Hair on digits Sensorium: Normal Semmes Weinstein monofilament test. Normal tactile sensation bilaterally. Nail Exam: Pt has thick disfigured discolored nails with subungual debris noted bilateral entire nail hallux through fifth toenails Ulcer Exam: There is no evidence of ulcer or pre-ulcerative changes or infection. Orthopedic Exam: Muscle tone and strength are WNL. No limitations in general ROM. No crepitus or effusions noted. Foot type and digits show no abnormalities.  HAV  B/L Skin: No Porokeratosis. No infection or ulcers  Diagnosis:  Onychomycosis, , Pain in right toe, pain in left toes  Treatment & Plan Procedures and Treatment: Consent by patient was obtained for treatment procedures. The patient understood the discussion of treatment and procedures well. All questions were answered thoroughly reviewed. Debridement of mycotic and hypertrophic toenails, 1 through 5 bilateral and clearing of subungual debris. No ulceration, no infection noted.  Return Visit-Office Procedure: Patient instructed to return to the office for a follow up visit 3 months for continued evaluation and treatment.  Helane GuntherGregory Ceylon Arenson DPM

## 2015-12-11 ENCOUNTER — Ambulatory Visit: Payer: Medicare Other | Admitting: Vascular Surgery

## 2016-02-02 ENCOUNTER — Ambulatory Visit: Payer: Medicare Other | Admitting: Podiatry

## 2016-03-01 ENCOUNTER — Encounter: Payer: Self-pay | Admitting: Podiatry

## 2016-03-01 ENCOUNTER — Ambulatory Visit (INDEPENDENT_AMBULATORY_CARE_PROVIDER_SITE_OTHER): Payer: Medicare Other | Admitting: Podiatry

## 2016-03-01 VITALS — Ht 62.0 in | Wt 142.0 lb

## 2016-03-01 DIAGNOSIS — M79676 Pain in unspecified toe(s): Secondary | ICD-10-CM

## 2016-03-01 DIAGNOSIS — B351 Tinea unguium: Secondary | ICD-10-CM

## 2016-03-01 DIAGNOSIS — E1140 Type 2 diabetes mellitus with diabetic neuropathy, unspecified: Secondary | ICD-10-CM

## 2016-03-01 NOTE — Progress Notes (Signed)
Patient ID: Eugene GarnetBarbara Bernard, female   DOB: 11/08/1940, 76 y.o.   MRN: [id number] Complaint:  Visit Type: Patient returns to my office for continued preventative foot care services. Complaint: Patient states" my nails have grown long and thick and become painful to walk and wear shoes" Patient has been diagnosed with DM with no foot complications. The patient presents for preventative foot care services. No changes to ROS  Podiatric Exam: Vascular: dorsalis pedis and posterior tibial pulses are palpable bilateral. Capillary return is immediate. Temperature gradient is WNL. Hair on digits Sensorium: Normal Semmes Weinstein monofilament test. Normal tactile sensation bilaterally. Nail Exam: Pt has thick disfigured discolored nails with subungual debris noted bilateral entire nail hallux through fifth toenails Ulcer Exam: There is no evidence of ulcer or pre-ulcerative changes or infection. Orthopedic Exam: Muscle tone and strength are WNL. No limitations in general ROM. No crepitus or effusions noted. Foot type and digits show no abnormalities.  HAV  B/L Skin: No Porokeratosis. No infection or ulcers  Diagnosis:  Onychomycosis, , Pain in right toe, pain in left toes  Treatment & Plan Procedures and Treatment: Consent by patient was obtained for treatment procedures. The patient understood the discussion of treatment and procedures well. All questions were answered thoroughly reviewed. Debridement of mycotic and hypertrophic toenails, 1 through 5 bilateral and clearing of subungual debris. No ulceration, no infection noted.  Return Visit-Office Procedure: Patient instructed to return to the office for a follow up visit 3 months for continued evaluation and treatment.  Helane GuntherGregory Hayward Rylander DPM

## 2016-05-24 ENCOUNTER — Ambulatory Visit: Payer: Medicare Other | Admitting: Podiatry

## 2016-06-04 ENCOUNTER — Ambulatory Visit (INDEPENDENT_AMBULATORY_CARE_PROVIDER_SITE_OTHER): Payer: Medicare Other | Admitting: Sports Medicine

## 2016-06-04 ENCOUNTER — Encounter: Payer: Self-pay | Admitting: Sports Medicine

## 2016-06-04 DIAGNOSIS — M79676 Pain in unspecified toe(s): Secondary | ICD-10-CM | POA: Diagnosis not present

## 2016-06-04 DIAGNOSIS — E1140 Type 2 diabetes mellitus with diabetic neuropathy, unspecified: Secondary | ICD-10-CM

## 2016-06-04 DIAGNOSIS — B351 Tinea unguium: Secondary | ICD-10-CM

## 2016-06-04 NOTE — Progress Notes (Signed)
Subjective: Brandy Flowers is a 76 y.o. female patient with history of diabetes, not on any medications, who presents to office today complaining of long, painful nails  while ambulating in shoes; unable to trim. Patient states that the glucose reading this morning was not recorded; last A1c 6.9. Patient denies any new changes in medication or new problems. Patient denies any new cramping, numbness, burning or tingling in the legs.  Patient Active Problem List   Diagnosis Date Noted  . 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 Left renal artery stenosis (HCC) 12/05/2014  . 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 Hypertension 09/20/2013  . Hypothyroid 09/20/2013   Current Outpatient Prescriptions on File Prior to Visit  Medication Sig Dispense Refill  . acetaminophen-codeine (TYLENOL #3) 300-30 MG per tablet Take by mouth as needed for moderate pain.     Marland Kitchen aMILoride (MIDAMOR) 5 MG tablet   3  . amoxicillin (AMOXIL) 500 MG capsule     . amoxicillin-clavulanate (AUGMENTIN) 875-125 MG per tablet     . atenolol (TENORMIN) 100 MG tablet Take 100 mg by mouth daily.    Marland Kitchen azithromycin (ZITHROMAX) 250 MG tablet     . escitalopram (LEXAPRO) 5 MG tablet     . ketoconazole (NIZORAL) 2 % cream     . levothyroxine (SYNTHROID, LEVOTHROID) 75 MCG tablet Take 100 mcg by mouth daily before breakfast.     . lidocaine (LIDODERM) 5 %     . LORazepam (ATIVAN) 1 MG tablet Take 1 mg by mouth 3 (three) times daily as needed.  5  . meloxicam (MOBIC) 15 MG tablet Take 15 mg by mouth daily.    Marland Kitchen oxyCODONE (OXY IR/ROXICODONE) 5 MG immediate release tablet     . predniSONE (STERAPRED UNI-PAK) 5 MG TABS tablet     . spironolactone (ALDACTONE) 25 MG tablet     . tiZANidine (ZANAFLEX) 2 MG tablet     . triamcinolone cream (KENALOG) 0.1 %      No current facility-administered medications on file prior to visit.    Allergies  Allergen Reactions  . Ace Inhibitors Nausea And Vomiting  .  Norvasc [Amlodipine] Nausea And Vomiting  . Blue Dyes (Parenteral)   . Sulfa Antibiotics     No results found for this or any previous visit (from the past 2160 hour(s)).  Objective: General: Patient is awake, alert, and oriented x 3 and in no acute distress.  Integument: Skin is warm, dry and supple bilateral. Nails are tender, long, thickened and dystrophic with subungual debris, consistent with onychomycosis, 1-5 bilateral. No signs of infection. No open lesions or preulcerative lesions present bilateral. Remaining integument unremarkable.  Vasculature:  Dorsalis Pedis pulse 2/4 bilateral. Posterior Tibial pulse  1/4 bilateral. Capillary fill time <3 sec 1-5 bilateral. Positive hair growth to the level of the digits.Temperature gradient within normal limits. No varicosities present bilateral. No edema present bilateral.   Neurology: The patient has intact sensation measured with a 5.07/10g Semmes Weinstein Monofilament at all pedal sites bilateral . Vibratory sensation diminished bilateral with tuning fork. No Babinski sign present bilateral.   Musculoskeletal:  Asymptomatic HAV pedal deformities noted bilateral. Muscular strength 5/5 in all lower extremity muscular groups bilateral without pain on range of motion . No tenderness with calf compression bilateral.  Assessment and Plan: Problem List Items Addressed This Visit      Endocrine   Type 2 diabetes, controlled, with neuropathy (HCC)    Other Visit Diagnoses  Pain due to onychomycosis of toenail    -  Primary     -Examined patient. -Discussed and educated patient on diabetic foot care, especially with  regards to the vascular, neurological and musculoskeletal systems.  -Stressed the importance of good glycemic control and the detriment of not  controlling glucose levels in relation to the foot. -Mechanically debrided all nails 1-5 bilateral using sterile nail nipper and filed with dremel without incident  -Answered all  patient questions -Patient to return  in 3 months for at risk foot care -Patient advised to call the office if any problems or questions arise in the meantime.  Asencion Islam, DPM

## 2016-09-03 ENCOUNTER — Ambulatory Visit (INDEPENDENT_AMBULATORY_CARE_PROVIDER_SITE_OTHER): Payer: Medicare Other | Admitting: Sports Medicine

## 2016-09-03 DIAGNOSIS — B351 Tinea unguium: Secondary | ICD-10-CM

## 2016-09-03 DIAGNOSIS — M79676 Pain in unspecified toe(s): Secondary | ICD-10-CM

## 2016-09-03 DIAGNOSIS — E1140 Type 2 diabetes mellitus with diabetic neuropathy, unspecified: Secondary | ICD-10-CM

## 2016-09-03 NOTE — Progress Notes (Signed)
Subjective: Brandy Flowers is a 76 y.o. female patient with history of diabetes, not on any medications, who presents to office today complaining of long, painful nails  while ambulating in shoes; unable to trim. Patient states that the glucose reading this morning was not recorded; last A1c 6.1. Patient denies any new changes in medication or new problems.   Patient Active Problem List   Diagnosis Date Noted  . 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 Left renal artery stenosis (HCC) 12/05/2014  . 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 Hypertension 09/20/2013  . Hypothyroid 09/20/2013   Current Outpatient Prescriptions on File Prior to Visit  Medication Sig Dispense Refill  . acetaminophen-codeine (TYLENOL #3) 300-30 MG per tablet Take by mouth as needed for moderate pain.     Marland Kitchen aMILoride (MIDAMOR) 5 MG tablet   3  . amoxicillin (AMOXIL) 500 MG capsule     . amoxicillin-clavulanate (AUGMENTIN) 875-125 MG per tablet     . atenolol (TENORMIN) 100 MG tablet Take 100 mg by mouth daily.    Marland Kitchen azithromycin (ZITHROMAX) 250 MG tablet     . escitalopram (LEXAPRO) 5 MG tablet     . ketoconazole (NIZORAL) 2 % cream     . levothyroxine (SYNTHROID, LEVOTHROID) 75 MCG tablet Take 100 mcg by mouth daily before breakfast.     . lidocaine (LIDODERM) 5 %     . LORazepam (ATIVAN) 1 MG tablet Take 1 mg by mouth 3 (three) times daily as needed.  5  . meloxicam (MOBIC) 15 MG tablet Take 15 mg by mouth daily.    Marland Kitchen oxyCODONE (OXY IR/ROXICODONE) 5 MG immediate release tablet     . predniSONE (STERAPRED UNI-PAK) 5 MG TABS tablet     . spironolactone (ALDACTONE) 25 MG tablet     . tiZANidine (ZANAFLEX) 2 MG tablet     . triamcinolone cream (KENALOG) 0.1 %      No current facility-administered medications on file prior to visit.    Allergies  Allergen Reactions  . Ace Inhibitors Nausea And Vomiting  . Norvasc [Amlodipine] Nausea And Vomiting  . Blue Dyes (Parenteral)   . Sulfa  Antibiotics     No results found for this or any previous visit (from the past 2160 hour(s)).  Objective: General: Patient is awake, alert, and oriented x 3 and in no acute distress.  Integument: Skin is warm, dry and supple bilateral. Nails are tender, long, thickened and dystrophic with subungual debris, consistent with onychomycosis, 1-5 bilateral. No signs of infection. No open lesions or preulcerative lesions present bilateral. Remaining integument unremarkable.  Vasculature:  Dorsalis Pedis pulse 2/4 bilateral. Posterior Tibial pulse  1/4 bilateral. Capillary fill time <3 sec 1-5 bilateral. Positive hair growth to the level of the digits.Temperature gradient within normal limits. No varicosities present bilateral. No edema present bilateral.   Neurology: The patient has intact sensation measured with a 5.07/10g Semmes Weinstein Monofilament at all pedal sites bilateral . Vibratory sensation diminished bilateral with tuning fork. No Babinski sign present bilateral.   Musculoskeletal:  Asymptomatic HAV pedal deformities noted bilateral. Muscular strength 5/5 in all lower extremity muscular groups bilateral without pain on range of motion . No tenderness with calf compression bilateral.  Assessment and Plan: Problem List Items Addressed This Visit      Endocrine   Type 2 diabetes, controlled, with neuropathy (HCC)    Other Visit Diagnoses    Pain due to onychomycosis of toenail    -  Primary     -Examined patient. -Discussed and educated patient on diabetic foot care, especially with  regards to the vascular, neurological and musculoskeletal systems.  -Stressed the importance of good glycemic control and the detriment of not  controlling glucose levels in relation to the foot. -Mechanically debrided all nails 1-5 bilateral using sterile nail nipper and filed with dremel without incident  -Answered all patient questions -Patient to return  in 3 months for at risk foot  care -Patient advised to call the office if any problems or questions arise in the meantime.  Asencion Islamitorya Isaia Hassell, DPM

## 2016-11-10 ENCOUNTER — Encounter (INDEPENDENT_AMBULATORY_CARE_PROVIDER_SITE_OTHER): Payer: Self-pay

## 2016-11-10 ENCOUNTER — Ambulatory Visit (INDEPENDENT_AMBULATORY_CARE_PROVIDER_SITE_OTHER): Payer: Medicare Other | Admitting: Sports Medicine

## 2016-11-10 DIAGNOSIS — E1140 Type 2 diabetes mellitus with diabetic neuropathy, unspecified: Secondary | ICD-10-CM

## 2016-11-10 DIAGNOSIS — B351 Tinea unguium: Secondary | ICD-10-CM | POA: Diagnosis not present

## 2016-11-10 DIAGNOSIS — M79676 Pain in unspecified toe(s): Secondary | ICD-10-CM | POA: Diagnosis not present

## 2016-11-10 NOTE — Progress Notes (Signed)
Subjective: Brandy Flowers is a 76 y.o. female patient with history of diabetes, not on any medications, who presents to office today complaining of long, painful nails while ambulating in shoes; unable to trim. Patient states that the glucose reading this morning was not recorded; last A1c 6.1, as previously noted. Patient denies any new changes in medication or new problems.   Patient Active Problem List   Diagnosis Date Noted  . 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 Left renal artery stenosis (HCC) 12/05/2014  . 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 Hypertension 09/20/2013  . Hypothyroid 09/20/2013   Current Outpatient Prescriptions on File Prior to Visit  Medication Sig Dispense Refill  . acetaminophen-codeine (TYLENOL #3) 300-30 MG per tablet Take by mouth as needed for moderate pain.     Marland Kitchen aMILoride (MIDAMOR) 5 MG tablet   3  . amoxicillin (AMOXIL) 500 MG capsule     . amoxicillin-clavulanate (AUGMENTIN) 875-125 MG per tablet     . atenolol (TENORMIN) 100 MG tablet Take 100 mg by mouth daily.    Marland Kitchen azithromycin (ZITHROMAX) 250 MG tablet     . escitalopram (LEXAPRO) 5 MG tablet     . ketoconazole (NIZORAL) 2 % cream     . levothyroxine (SYNTHROID, LEVOTHROID) 75 MCG tablet Take 100 mcg by mouth daily before breakfast.     . lidocaine (LIDODERM) 5 %     . LORazepam (ATIVAN) 1 MG tablet Take 1 mg by mouth 3 (three) times daily as needed.  5  . meloxicam (MOBIC) 15 MG tablet Take 15 mg by mouth daily.    Marland Kitchen oxyCODONE (OXY IR/ROXICODONE) 5 MG immediate release tablet     . predniSONE (STERAPRED UNI-PAK) 5 MG TABS tablet     . spironolactone (ALDACTONE) 25 MG tablet     . tiZANidine (ZANAFLEX) 2 MG tablet     . triamcinolone cream (KENALOG) 0.1 %      No current facility-administered medications on file prior to visit.    Allergies  Allergen Reactions  . Ace Inhibitors Nausea And Vomiting  . Norvasc [Amlodipine] Nausea And Vomiting  . Blue Dyes  (Parenteral)   . Sulfa Antibiotics     No results found for this or any previous visit (from the past 2160 hour(s)).  Objective: General: Patient is awake, alert, and oriented x 3 and in no acute distress.  Integument: Skin is warm, dry and supple bilateral. Nails are tender, long, thickened and dystrophic with subungual debris, consistent with onychomycosis, 1-5 bilateral. No signs of infection. No open lesions or preulcerative lesions present bilateral. Small pinch callus at distal tuft of left 4th toe with no signs of infection. Remaining integument unremarkable.  Vasculature:  Dorsalis Pedis pulse 2/4 bilateral. Posterior Tibial pulse  1/4 bilateral. Capillary fill time <3 sec 1-5 bilateral. Positive hair growth to the level of the digits.Temperature gradient within normal limits. No varicosities present bilateral. No edema present bilateral.   Neurology: The patient has intact sensation measured with a 5.07/10g Semmes Weinstein Monofilament at all pedal sites bilateral . Vibratory sensation diminished bilateral with tuning fork. No Babinski sign present bilateral.   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 Muscular strength 5/5 in all lower extremity muscular groups bilateral without pain on range of motion . No tenderness with calf compression bilateral.  Assessment and Plan: Problem List Items Addressed This Visit      Endocrine   Type 2 diabetes, controlled, with neuropathy (HCC)  Other Visit Diagnoses    Pain due to onychomycosis of toenail    -  Primary     -Examined patient. -Discussed and educated patient on diabetic foot care, especially with  regards to the vascular, neurological and musculoskeletal systems.  -Stressed the importance of good glycemic control and the detriment of not  controlling glucose levels in relation to the foot. -Mechanically debrided all nails 1-5 bilateral using sterile nail nipper and filed with  dremel without incident  -Smoothed callus using bur at tip of left 4th toe without incident  -Answered all patient questions -Patient to return  in 62 days for at risk foot care -Patient advised to call the office if any problems or questions arise in the meantime.  Asencion Islam, DPM

## 2016-11-11 ENCOUNTER — Encounter: Payer: Self-pay | Admitting: Vascular Surgery

## 2016-11-11 ENCOUNTER — Ambulatory Visit: Payer: Medicare Other | Admitting: Sports Medicine

## 2016-11-11 ENCOUNTER — Ambulatory Visit (INDEPENDENT_AMBULATORY_CARE_PROVIDER_SITE_OTHER): Payer: Medicare Other | Admitting: Vascular Surgery

## 2016-11-11 ENCOUNTER — Ambulatory Visit (HOSPITAL_COMMUNITY)
Admission: RE | Admit: 2016-11-11 | Discharge: 2016-11-11 | Disposition: A | Discharge status: Home / Self Care | Payer: Medicare Other | Source: Ambulatory Visit | Attending: Vascular Surgery | Admitting: Vascular Surgery

## 2016-11-11 VITALS — BP 160/86 | HR 64 | Temp 97.7°F | Resp 18 | Ht 62.0 in | Wt 140.1 lb

## 2016-11-11 DIAGNOSIS — I728 Aneurysm of other specified arteries: Secondary | ICD-10-CM | POA: Insufficient documentation

## 2016-11-11 DIAGNOSIS — I701 Atherosclerosis of renal artery: Secondary | ICD-10-CM | POA: Insufficient documentation

## 2016-11-11 NOTE — Progress Notes (Signed)
Referring Physician: Dr [name]  Patient name: Brandy Flowers MRN: [id number] DOB: 11-10-1940 Sex: female   HPI: Brandy Flowers is a 76 y.o. female  who returns for follow-up today regarding a left renal artery stenosis with poorly controlled hypertension and a splenic artery aneurysm.  She had a renal artery duplex in 2015 which showed slightly increased velocity at the proximal left renal artery but not hemodynamically significant. On a CT scan in 2016 she was also have a 50% left renal artery stenosis and a small splenic artery aneurysm. Splenic artery aneurysm was 7 mm in diameter in 2016 and unchanged in 2017 by CT scan. She currently is on atenolol, Aldactone, and amiloride for blood pressure control. She denies any change in renal function. She states her blood pressure will be in the 120s to 140s some days but in the 190s other days. She states that the best blood pressure control she gets is with Ativan. She does see a psychologist for anxiety issues. Other medical problems include diabetes and arthritis both of which have been stable.  Past Medical History:  Diagnosis Date  . Anxiety   . Arthritis   . Diabetes mellitus without complication (HCC)   . Hypertension   . Sinus problem   . Thyroid disease    Past Surgical History:  Procedure Laterality Date  . BREAST SURGERY    . EYE SURGERY      Family History  Problem Relation Age of Onset  . Stroke Mother   . Diabetes Mother   . Heart disease Mother        Before age 40  . Hypertension Mother   . Hyperlipidemia Mother   . Heart disease Father   . Heart attack Father   . Heart disease Brother        After age 64- CABG  . Hypertension Brother     SOCIAL HISTORY: Social History   Social History  . Marital status: Widowed    Spouse name: N/A  . Number of children: N/A  . Years of education: N/A   Occupational History  . Not on file.   Social History Main Topics  . Smoking status: Never Smoker  . Smokeless  tobacco: Never Used  . Alcohol use No  . Drug use: No  . Sexual activity: Not on file   Other Topics Concern  . Not on file   Social History Narrative  . No narrative on file    Allergies  Allergen Reactions  . Ace Inhibitors Nausea And Vomiting  . Norvasc [Amlodipine] Nausea And Vomiting  . Blue Dyes (Parenteral)   . Sulfa Antibiotics     Current Outpatient Prescriptions  Medication Sig Dispense Refill  . acetaminophen-codeine (TYLENOL #3) 300-30 MG per tablet Take by mouth as needed for moderate pain.     Marland Kitchen atenolol (TENORMIN) 100 MG tablet Take 100 mg by mouth daily.    Marland Kitchen ketoconazole (NIZORAL) 2 % cream     . lidocaine (LIDODERM) 5 %     . LORazepam (ATIVAN) 1 MG tablet Take 1 mg by mouth 3 (three) times daily as needed.  5  . meloxicam (MOBIC) 15 MG tablet Take 15 mg by mouth daily.    Marland Kitchen aMILoride (MIDAMOR) 5 MG tablet   3  . amoxicillin (AMOXIL) 500 MG capsule     . amoxicillin-clavulanate (AUGMENTIN) 875-125 MG per tablet     . azithromycin (ZITHROMAX) 250 MG tablet     . escitalopram (LEXAPRO) 5 MG  tablet     . levothyroxine (SYNTHROID, LEVOTHROID) 75 MCG tablet Take 100 mcg by mouth daily before breakfast.     . oxyCODONE (OXY IR/ROXICODONE) 5 MG immediate release tablet     . predniSONE (STERAPRED UNI-PAK) 5 MG TABS tablet     . spironolactone (ALDACTONE) 25 MG tablet     . tiZANidine (ZANAFLEX) 2 MG tablet     . triamcinolone cream (KENALOG) 0.1 %      No current facility-administered medications for this visit.     ROS:   General:  No weight loss, Fever, chills  Psychological: + history of anxiety,  No history of depression   Physical Examination  Vitals:   11/11/16 0855 11/11/16 0856  BP: (!) 196/91 (!) 160/86  Pulse: 64   Resp: 18   Temp: 97.7 F (36.5 C)   TempSrc: Oral   SpO2: 96%   Weight: 140 lb 1.6 oz (63.5 kg)   Height:  (1.575 m)     Body mass index is 25.62 kg/m.  General:  Alert and oriented, no acute distress HEENT:  Normal Neck: No bruit or JVD Pulmonary: Clear to auscultation bilaterally Cardiac: Regular Rate and Rhythm without murmur Abdomen: Soft, non-tender, non-distended, no mass, no bruit Skin: No rash Extremity Pulses:  2+ radial, brachial, femoral pulses bilaterally Musculoskeletal: No deformity or edema  Neurologic: Upper and lower extremity motor 5/5 and symmetric  DATA:  patient had a renal artery duplex scan in 11/11/2016. This showed no right renal artery stenosis. Left renal artery again had mild stenosis but velocities were only 230 cm/s at the renal artery origin.  ASSESSMENT:  Overall she has now had multiple scans of her left renal artery dating back to 2015 which has not really shown any flow limiting stenosis of the left renal artery with scans ranging anywhere from less than 50% to maybe as high as 60%.   No decline in renal function. Splenic artery aneurysm was anastomosed evaluated on a CT scan 2017 and was only 7 mm in diameter. She is currently 36 years in age and it has been stable for several years of do not necessarily believe this needs any further evaluation.    PLAN:  The patient will return for follow-up in one year with a repeat renal artery duplex scan. If the stenosis progresses to more than 70 or 80% or she has decline in her renal function we would consider an arteriogram to further evaluate this. Otherwise continue medical management.   Fabienne Bruns, MD [facility] Office: [phone] Pager: [phone]

## 2016-11-17 NOTE — Addendum Note (Signed)
Addended by: Burton Apley A on: 11/17/2016 10:30 AM   Modules accepted: Orders

## 2017-01-14 ENCOUNTER — Encounter: Payer: Self-pay | Admitting: Sports Medicine

## 2017-01-14 ENCOUNTER — Ambulatory Visit (INDEPENDENT_AMBULATORY_CARE_PROVIDER_SITE_OTHER): Payer: Medicare Other | Admitting: Sports Medicine

## 2017-01-14 DIAGNOSIS — B351 Tinea unguium: Secondary | ICD-10-CM | POA: Diagnosis not present

## 2017-01-14 DIAGNOSIS — E1140 Type 2 diabetes mellitus with diabetic neuropathy, unspecified: Secondary | ICD-10-CM | POA: Diagnosis not present

## 2017-01-14 DIAGNOSIS — M79676 Pain in unspecified toe(s): Secondary | ICD-10-CM | POA: Diagnosis not present

## 2017-01-14 NOTE — Progress Notes (Signed)
Subjective: Brandy Flowers is a 76 y.o. female patient with history of diabetes, not on any medications, who presents to office today complaining of long, painful nails while ambulating in shoes; unable to trim. Patient states that the glucose reading this morning was 120, saw PCP 2-3 weeks ago. Patient denies any new changes in medication or new problems.   Patient Active Problem List   Diagnosis Date Noted  . 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 Left renal artery stenosis (HCC) 12/05/2014  . 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 Hypertension 09/20/2013  . Hypothyroid 09/20/2013   Current Outpatient Medications on File Prior to Visit  Medication Sig Dispense Refill  . acetaminophen-codeine (TYLENOL #3) 300-30 MG per tablet Take by mouth as needed for moderate pain.     Marland Kitchen aMILoride (MIDAMOR) 5 MG tablet   3  . amoxicillin (AMOXIL) 500 MG capsule     . amoxicillin-clavulanate (AUGMENTIN) 875-125 MG per tablet     . atenolol (TENORMIN) 100 MG tablet Take 100 mg by mouth daily.    Marland Kitchen azithromycin (ZITHROMAX) 250 MG tablet     . escitalopram (LEXAPRO) 5 MG tablet     . ketoconazole (NIZORAL) 2 % cream     . levothyroxine (SYNTHROID, LEVOTHROID) 75 MCG tablet Take 100 mcg by mouth daily before breakfast.     . lidocaine (LIDODERM) 5 %     . LORazepam (ATIVAN) 1 MG tablet Take 1 mg by mouth 3 (three) times daily as needed.  5  . meloxicam (MOBIC) 15 MG tablet Take 15 mg by mouth daily.    Marland Kitchen oxyCODONE (OXY IR/ROXICODONE) 5 MG immediate release tablet     . predniSONE (STERAPRED UNI-PAK) 5 MG TABS tablet     . spironolactone (ALDACTONE) 25 MG tablet     . tiZANidine (ZANAFLEX) 2 MG tablet     . triamcinolone cream (KENALOG) 0.1 %      No current facility-administered medications on file prior to visit.    Allergies  Allergen Reactions  . Ace Inhibitors Nausea And Vomiting  . Norvasc [Amlodipine] Nausea And Vomiting  . Blue Dyes (Parenteral)   . Sulfa  Antibiotics     No results found for this or any previous visit (from the past 2160 hour(s)).  Objective: General: Patient is awake, alert, and oriented x 3 and in no acute distress.  Integument: Skin is warm, dry and supple bilateral. Nails are tender, long, thickened and dystrophic with subungual debris, consistent with onychomycosis, 1-5 bilateral. No signs of infection. No open lesions or preulcerative lesions present bilateral. Small pinch callus at distal tuft of left 4th toe with no signs of infection. Remaining integument unremarkable.  Vasculature:  Dorsalis Pedis pulse 2/4 bilateral. Posterior Tibial pulse  1/4 bilateral. Capillary fill time <3 sec 1-5 bilateral. Positive hair growth to the level of the digits.Temperature gradient within normal limits. No varicosities present bilateral. No edema present bilateral.   Neurology: The patient has intact sensation measured with a 5.07/10g Semmes Weinstein Monofilament at all pedal sites bilateral . Vibratory sensation diminished bilateral with tuning fork. No Babinski sign present bilateral.   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 Muscular strength 5/5 in all lower extremity muscular groups bilateral without pain on range of motion . No tenderness with calf compression bilateral.  Assessment and Plan: Problem List Items Addressed This Visit      Endocrine   Type 2 diabetes, controlled, with neuropathy (HCC)  Other Visit Diagnoses    Pain due to onychomycosis of toenail    -  Primary     -Examined patient. -Discussed and educated patient on diabetic foot care, especially with  regards to the vascular, neurological and musculoskeletal systems.  -Stressed the importance of good glycemic control and the detriment of not  controlling glucose levels in relation to the foot. -Mechanically debrided all nails 1-5 bilateral using sterile nail nipper and filed with dremel without incident   -Smoothed callus using bur at tip of left 4th toe without incident  -Answered all patient questions -Patient to return  in 62 days for at risk foot care -Patient advised to call the office if any problems or questions arise in the meantime.  Asencion Islamitorya Kierstynn Babich, DPM

## 2017-02-15 HISTORY — PX: TOOTH EXTRACTION: SUR596

## 2017-03-25 ENCOUNTER — Encounter: Payer: Self-pay | Admitting: Sports Medicine

## 2017-03-25 ENCOUNTER — Ambulatory Visit (INDEPENDENT_AMBULATORY_CARE_PROVIDER_SITE_OTHER): Payer: Medicare Other | Admitting: Sports Medicine

## 2017-03-25 DIAGNOSIS — E1140 Type 2 diabetes mellitus with diabetic neuropathy, unspecified: Secondary | ICD-10-CM

## 2017-03-25 DIAGNOSIS — M79676 Pain in unspecified toe(s): Secondary | ICD-10-CM | POA: Diagnosis not present

## 2017-03-25 DIAGNOSIS — B351 Tinea unguium: Secondary | ICD-10-CM | POA: Diagnosis not present

## 2017-03-25 NOTE — Progress Notes (Signed)
Subjective: Brandy GarnetBarbara Erber is a 77 y.o. female patient with history of diabetes, not on any medications, who presents to office today complaining of long, painful nails while ambulating in shoes; unable to trim. Patient states that the glucose reading this morning was not recorded saw PCP 4 weeks ago. Patient denies any new changes in medication or new problems.   Patient Active Problem List   Diagnosis Date Noted  . 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 Left renal artery stenosis (HCC) 12/05/2014  . 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 Hypertension 09/20/2013  . Hypothyroid 09/20/2013   Current Outpatient Medications on File Prior to Visit  Medication Sig Dispense Refill  . acetaminophen-codeine (TYLENOL #3) 300-30 MG per tablet Take by mouth as needed for moderate pain.     Marland Kitchen. atenolol (TENORMIN) 100 MG tablet Take 100 mg by mouth daily.    Marland Kitchen. ketoconazole (NIZORAL) 2 % cream     . lidocaine (LIDODERM) 5 %     . LORazepam (ATIVAN) 1 MG tablet Take 1 mg by mouth 3 (three) times daily as needed.  5  . SYNTHROID 50 MCG tablet     . aMILoride (MIDAMOR) 5 MG tablet   3  . amoxicillin (AMOXIL) 500 MG capsule     . amoxicillin-clavulanate (AUGMENTIN) 875-125 MG per tablet     . azithromycin (ZITHROMAX) 250 MG tablet     . escitalopram (LEXAPRO) 5 MG tablet     . levothyroxine (SYNTHROID, LEVOTHROID) 75 MCG tablet Take 100 mcg by mouth daily before breakfast.     . meloxicam (MOBIC) 15 MG tablet Take 15 mg by mouth daily.    Marland Kitchen. oxyCODONE (OXY IR/ROXICODONE) 5 MG immediate release tablet     . predniSONE (STERAPRED UNI-PAK) 5 MG TABS tablet     . spironolactone (ALDACTONE) 25 MG tablet     . tiZANidine (ZANAFLEX) 2 MG tablet     . triamcinolone cream (KENALOG) 0.1 %      No current facility-administered medications on file prior to visit.    Allergies  Allergen Reactions  . Ace Inhibitors Nausea And Vomiting  . Norvasc [Amlodipine] Nausea And Vomiting  .  Blue Dyes (Parenteral)   . Sulfa Antibiotics     No results found for this or any previous visit (from the past 2160 hour(s)).  Objective: General: Patient is awake, alert, and oriented x 3 and in no acute distress.  Integument: Skin is warm, dry and supple bilateral. Nails are tender, long, thickened and dystrophic with subungual debris, consistent with onychomycosis, 1-5 bilateral. No signs of infection. No open lesions or preulcerative lesions present bilateral. Small pinch callus at distal tuft of left 4th toe with no signs of infection. Remaining integument unremarkable.  Vasculature:  Dorsalis Pedis pulse 2/4 bilateral. Posterior Tibial pulse  1/4 bilateral. Capillary fill time <3 sec 1-5 bilateral. Positive hair growth to the level of the digits.Temperature gradient within normal limits. No varicosities present bilateral. No edema present bilateral.   Neurology: The patient has intact sensation measured with a 5.07/10g Semmes Weinstein Monofilament at all pedal sites bilateral . Vibratory sensation diminished bilateral with tuning fork. No Babinski sign present bilateral.   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 Muscular strength 5/5 in all lower extremity muscular groups bilateral without pain on range of motion . No tenderness with calf compression bilateral.  Assessment and Plan: Problem List Items Addressed This Visit      Endocrine  Type 2 diabetes, controlled, with neuropathy (HCC)    Other Visit Diagnoses    Pain due to onychomycosis of toenail    -  Primary     -Examined patient. -Discussed and educated patient on diabetic foot care, especially with  regards to the vascular, neurological and musculoskeletal systems.  -Stressed the importance of good glycemic control and the detriment of not  controlling glucose levels in relation to the foot. -Mechanically debrided all nails 1-5 bilateral using sterile nail nipper and filed  with dremel without incident  -Smoothed callus using bur at tip of left 4th toe without incident  -Answered all patient questions -Patient to return  in 62 days for at risk foot care -Patient advised to call the office if any problems or questions arise in the meantime.  Asencion Islam, DPM

## 2017-05-27 ENCOUNTER — Ambulatory Visit (INDEPENDENT_AMBULATORY_CARE_PROVIDER_SITE_OTHER): Payer: Medicare Other | Admitting: Sports Medicine

## 2017-05-27 ENCOUNTER — Encounter: Payer: Self-pay | Admitting: Sports Medicine

## 2017-05-27 DIAGNOSIS — E1140 Type 2 diabetes mellitus with diabetic neuropathy, unspecified: Secondary | ICD-10-CM

## 2017-05-27 DIAGNOSIS — M79676 Pain in unspecified toe(s): Secondary | ICD-10-CM

## 2017-05-27 DIAGNOSIS — B351 Tinea unguium: Secondary | ICD-10-CM | POA: Diagnosis not present

## 2017-05-27 NOTE — Progress Notes (Signed)
Subjective: Brandy Flowers is a 77 y.o. female patient with history of diabetes, not on any medications, who presents to office today complaining of long, painful nails while ambulating in shoes; unable to trim. Patient states that the glucose reading this morning was 128 and saw PCP 12 weeks ago. Patient denies any new changes in medication or new problems.   Patient Active Problem List   Diagnosis Date Noted  . 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 Left renal artery stenosis (HCC) 12/05/2014  . 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 Hypertension 09/20/2013  . Hypothyroid 09/20/2013   Current Outpatient Medications on File Prior to Visit  Medication Sig Dispense Refill  . acetaminophen-codeine (TYLENOL #3) 300-30 MG per tablet Take by mouth as needed for moderate pain.     Marland Kitchen aMILoride (MIDAMOR) 5 MG tablet   3  . amoxicillin (AMOXIL) 500 MG capsule     . amoxicillin-clavulanate (AUGMENTIN) 875-125 MG per tablet     . atenolol (TENORMIN) 100 MG tablet Take 100 mg by mouth daily.    Marland Kitchen azithromycin (ZITHROMAX) 250 MG tablet     . escitalopram (LEXAPRO) 5 MG tablet     . ketoconazole (NIZORAL) 2 % cream     . levothyroxine (SYNTHROID, LEVOTHROID) 75 MCG tablet Take 100 mcg by mouth daily before breakfast.     . lidocaine (LIDODERM) 5 %     . LORazepam (ATIVAN) 1 MG tablet Take 1 mg by mouth 3 (three) times daily as needed.  5  . meloxicam (MOBIC) 15 MG tablet Take 15 mg by mouth daily.    Marland Kitchen oxyCODONE (OXY IR/ROXICODONE) 5 MG immediate release tablet     . predniSONE (STERAPRED UNI-PAK) 5 MG TABS tablet     . spironolactone (ALDACTONE) 25 MG tablet     . SYNTHROID 50 MCG tablet     . tiZANidine (ZANAFLEX) 2 MG tablet     . triamcinolone cream (KENALOG) 0.1 %      No current facility-administered medications on file prior to visit.    Allergies  Allergen Reactions  . Ace Inhibitors Nausea And Vomiting  . Norvasc [Amlodipine] Nausea And Vomiting  . Blue  Dyes (Parenteral)   . Sulfa Antibiotics     No results found for this or any previous visit (from the past 2160 hour(s)).  Objective: General: Patient is awake, alert, and oriented x 3 and in no acute distress.  Integument: Skin is warm, dry and supple bilateral. Nails are tender, long, thickened and dystrophic with subungual debris, consistent with onychomycosis, 1-5 bilateral. No signs of infection. No open lesions or preulcerative lesions present bilateral. Small pinch callus at distal tuft of left 4th toe with no signs of infection. Remaining integument unremarkable.  Vasculature:  Dorsalis Pedis pulse 2/4 bilateral. Posterior Tibial pulse  1/4 bilateral. Capillary fill time <3 sec 1-5 bilateral. Positive hair growth to the level of the digits.Temperature gradient within normal limits. No varicosities present bilateral. No edema present bilateral.   Neurology: The patient has intact sensation measured with a 5.07/10g Semmes Weinstein Monofilament at all pedal sites bilateral . Vibratory sensation diminished bilateral with tuning fork. No Babinski sign present bilateral.   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 Muscular strength 5/5 in all lower extremity muscular groups bilateral without pain on range of motion . No tenderness with calf compression bilateral.  Assessment and Plan: Problem List Items Addressed This Visit      Endocrine  Type 2 diabetes, controlled, with neuropathy (HCC)    Other Visit Diagnoses    Pain due to onychomycosis of toenail    -  Primary     -Examined patient. -Discussed and educated patient on diabetic foot care, especially with  regards to the vascular, neurological and musculoskeletal systems.  -Stressed the importance of good glycemic control and the detriment of not  controlling glucose levels in relation to the foot. -Mechanically debrided all nails 1-5 bilateral using sterile nail nipper and filed with  dremel without incident  -Smoothed callus using bur at tip of left 4th toe without incident  -Answered all patient questions -Patient to return  in 62 days for at risk foot care -Patient advised to call the office if any problems or questions arise in the meantime.  Asencion Islamitorya Oluwatimileyin Vivier, DPM

## 2017-07-29 ENCOUNTER — Ambulatory Visit: Payer: Medicare Other | Admitting: Sports Medicine

## 2017-08-05 ENCOUNTER — Ambulatory Visit (INDEPENDENT_AMBULATORY_CARE_PROVIDER_SITE_OTHER): Payer: Medicare Other | Admitting: Sports Medicine

## 2017-08-05 ENCOUNTER — Encounter: Payer: Self-pay | Admitting: Sports Medicine

## 2017-08-05 VITALS — BP 172/82 | HR 65 | Temp 97.8°F | Resp 14

## 2017-08-05 DIAGNOSIS — B351 Tinea unguium: Secondary | ICD-10-CM | POA: Diagnosis not present

## 2017-08-05 DIAGNOSIS — E1140 Type 2 diabetes mellitus with diabetic neuropathy, unspecified: Secondary | ICD-10-CM

## 2017-08-05 DIAGNOSIS — M79676 Pain in unspecified toe(s): Secondary | ICD-10-CM | POA: Diagnosis not present

## 2017-08-05 NOTE — Progress Notes (Signed)
Subjective: Brandy Flowers is a 77 y.o. female patient with history of diabetes, not on any medications, who presents to office today complaining of long, painful nails while ambulating in shoes; unable to trim. Patient states that the glucose reading this morning was 107 and saw PCP in May with last A1c recorded at 6.2. Patient denies any new changes in medication or new problems.   Patient Active Problem List   Diagnosis Date Noted  . 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 Left renal artery stenosis (HCC) 12/05/2014  . 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 Hypertension 09/20/2013  . Hypothyroid 09/20/2013   Current Outpatient Medications on File Prior to Visit  Medication Sig Dispense Refill  . acetaminophen-codeine (TYLENOL #3) 300-30 MG per tablet Take by mouth as needed for moderate pain.     Marland Kitchen aMILoride (MIDAMOR) 5 MG tablet   3  . amoxicillin (AMOXIL) 500 MG capsule     . amoxicillin-clavulanate (AUGMENTIN) 875-125 MG per tablet     . atenolol (TENORMIN) 100 MG tablet Take 100 mg by mouth daily.    Marland Kitchen azithromycin (ZITHROMAX) 250 MG tablet     . escitalopram (LEXAPRO) 5 MG tablet     . ketoconazole (NIZORAL) 2 % cream     . levothyroxine (SYNTHROID, LEVOTHROID) 75 MCG tablet Take 100 mcg by mouth daily before breakfast.     . lidocaine (LIDODERM) 5 %     . LORazepam (ATIVAN) 1 MG tablet Take 1 mg by mouth 3 (three) times daily as needed.  5  . meloxicam (MOBIC) 15 MG tablet Take 15 mg by mouth daily.    Marland Kitchen oxyCODONE (OXY IR/ROXICODONE) 5 MG immediate release tablet     . predniSONE (STERAPRED UNI-PAK) 5 MG TABS tablet     . spironolactone (ALDACTONE) 25 MG tablet     . SYNTHROID 50 MCG tablet     . tiZANidine (ZANAFLEX) 2 MG tablet     . triamcinolone cream (KENALOG) 0.1 %      No current facility-administered medications on file prior to visit.    Allergies  Allergen Reactions  . Ace Inhibitors Nausea And Vomiting  . Norvasc [Amlodipine]  Nausea And Vomiting  . Blue Dyes (Parenteral)   . Sulfa Antibiotics     No results found for this or any previous visit (from the past 2160 hour(s)).  Objective: General: Patient is awake, alert, and oriented x 3 and in no acute distress.  Integument: Skin is warm, dry and supple bilateral. Nails are tender, long, thickened and dystrophic with subungual debris, consistent with onychomycosis, 1-5 bilateral. No signs of infection. No open lesions or preulcerative lesions present bilateral. Small pinch callus at distal tuft of left 4th toe with no signs of infection. Remaining integument unremarkable.  Vasculature:  Dorsalis Pedis pulse 2/4 bilateral. Posterior Tibial pulse  1/4 bilateral. Capillary fill time <3 sec 1-5 bilateral. Positive hair growth to the level of the digits.Temperature gradient within normal limits. No varicosities present bilateral. No edema present bilateral.   Neurology: The patient has intact sensation measured with a 5.07/10g Semmes Weinstein Monofilament at all pedal sites bilateral . Vibratory sensation diminished bilateral with tuning fork. No Babinski sign present bilateral.   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 Muscular strength 5/5 in all lower extremity muscular groups bilateral without pain on range of motion . No tenderness with calf compression bilateral.  Assessment and Plan: Problem List Items Addressed This Visit  Endocrine   Type 2 diabetes, controlled, with neuropathy (HCC)    Other Visit Diagnoses    Pain due to onychomycosis of toenail    -  Primary     -Examined patient. -Discussed and educated patient on diabetic foot care, especially with  regards to the vascular, neurological and musculoskeletal systems.  -Stressed the importance of good glycemic control and the detriment of not  controlling glucose levels in relation to the foot. -Mechanically debrided all nails 1-5 bilateral using  sterile nail nipper and filed with dremel without incident  -Smoothed callus using bur at tip of left 4th toe without incident  -Patient to return  in 62 days for at risk foot care -Patient advised to call the office if any problems or questions arise in the meantime.  Asencion Islamitorya Delorice Bannister, DPM

## 2017-10-06 ENCOUNTER — Ambulatory Visit: Payer: Medicare Other | Admitting: Sports Medicine

## 2017-10-13 ENCOUNTER — Ambulatory Visit (INDEPENDENT_AMBULATORY_CARE_PROVIDER_SITE_OTHER): Payer: Medicare Other | Admitting: Sports Medicine

## 2017-10-13 ENCOUNTER — Encounter: Payer: Self-pay | Admitting: Sports Medicine

## 2017-10-13 VITALS — BP 158/78 | HR 64 | Resp 15

## 2017-10-13 DIAGNOSIS — B351 Tinea unguium: Secondary | ICD-10-CM | POA: Diagnosis not present

## 2017-10-13 DIAGNOSIS — E1140 Type 2 diabetes mellitus with diabetic neuropathy, unspecified: Secondary | ICD-10-CM

## 2017-10-13 DIAGNOSIS — M79676 Pain in unspecified toe(s): Secondary | ICD-10-CM | POA: Diagnosis not present

## 2017-10-13 NOTE — Progress Notes (Signed)
Subjective: Eugene GarnetBarbara Vandenberg is a 77 y.o. female patient with history of diabetes, not on any medications, who presents to office today complaining of long, painful nails while ambulating in shoes; unable to trim. Patient states that the glucose reading this morning was 112 and saw PCP in May with last A1c recorded at 6.2. A little tingling. Patient denies any new changes in medication or new problems.   Patient Active Problem List   Diagnosis Date Noted  . 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 Left renal artery stenosis (HCC) 12/05/2014  . 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 Hypertension 09/20/2013  . Hypothyroid 09/20/2013   Current Outpatient Medications on File Prior to Visit  Medication Sig Dispense Refill  . acetaminophen-codeine (TYLENOL #3) 300-30 MG per tablet Take by mouth as needed for moderate pain.     Marland Kitchen. aMILoride (MIDAMOR) 5 MG tablet   3  . amoxicillin (AMOXIL) 500 MG capsule     . amoxicillin-clavulanate (AUGMENTIN) 875-125 MG per tablet     . atenolol (TENORMIN) 100 MG tablet Take 100 mg by mouth daily.    Marland Kitchen. azithromycin (ZITHROMAX) 250 MG tablet     . escitalopram (LEXAPRO) 5 MG tablet     . ketoconazole (NIZORAL) 2 % cream     . levothyroxine (SYNTHROID, LEVOTHROID) 75 MCG tablet Take 100 mcg by mouth daily before breakfast.     . lidocaine (LIDODERM) 5 %     . LORazepam (ATIVAN) 1 MG tablet Take 1 mg by mouth 3 (three) times daily as needed.  5  . meloxicam (MOBIC) 15 MG tablet Take 15 mg by mouth daily.    Marland Kitchen. oxyCODONE (OXY IR/ROXICODONE) 5 MG immediate release tablet     . predniSONE (STERAPRED UNI-PAK) 5 MG TABS tablet     . spironolactone (ALDACTONE) 25 MG tablet     . SYNTHROID 50 MCG tablet     . tiZANidine (ZANAFLEX) 2 MG tablet     . triamcinolone cream (KENALOG) 0.1 %      No current facility-administered medications on file prior to visit.    Allergies  Allergen Reactions  . Ace Inhibitors Nausea And Vomiting  .  Norvasc [Amlodipine] Nausea And Vomiting  . Blue Dyes (Parenteral)   . Sulfa Antibiotics     No results found for this or any previous visit (from the past 2160 hour(s)).  Objective: General: Patient is awake, alert, and oriented x 3 and in no acute distress.  Integument: Skin is warm, dry and supple bilateral. Nails are tender, long, thickened and dystrophic with subungual debris, consistent with onychomycosis, 1-5 bilateral. No signs of infection. No open lesions or preulcerative lesions present bilateral. Small pinch callus at distal tuft of left 4th toe with no signs of infection. Remaining integument unremarkable.  Vasculature:  Dorsalis Pedis pulse 2/4 bilateral. Posterior Tibial pulse  1/4 bilateral. Capillary fill time <3 sec 1-5 bilateral. Positive hair growth to the level of the digits.Temperature gradient within normal limits. No varicosities present bilateral. No edema present bilateral.   Neurology: The patient has intact sensation measured with a 5.07/10g Semmes Weinstein Monofilament at all pedal sites bilateral . Vibratory sensation diminished bilateral with tuning fork. No Babinski sign present bilateral.   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 Muscular strength 5/5 in all lower extremity muscular groups bilateral without pain on range of motion . No tenderness with calf compression bilateral.  Assessment and Plan: Problem List Items Addressed This  Visit      Endocrine   Type 2 diabetes, controlled, with neuropathy (HCC)    Other Visit Diagnoses    Pain due to onychomycosis of toenail    -  Primary     -Examined patient. -Discussed and educated patient on diabetic foot care, especially with  regards to the vascular, neurological and musculoskeletal systems.  Advised patient to further discuss tingling with PCP likely related to her diabetes. -Mechanically debrided all nails 1-5 bilateral using sterile nail nipper and filed  with dremel without incident  -Smoothed callus using bur at tip of left 4th toe without incident  -Patient to return  in 62 days for at risk foot care -Patient advised to call the office if any problems or questions arise in the meantime.  Asencion Islam, DPM

## 2017-12-15 ENCOUNTER — Ambulatory Visit: Payer: Medicare Other | Admitting: Sports Medicine

## 2017-12-16 DIAGNOSIS — I639 Cerebral infarction, unspecified: Secondary | ICD-10-CM

## 2017-12-16 HISTORY — DX: Cerebral infarction, unspecified: I63.9

## 2017-12-17 ENCOUNTER — Inpatient Hospital Stay (HOSPITAL_COMMUNITY): Payer: Medicare Other

## 2017-12-17 ENCOUNTER — Other Ambulatory Visit: Payer: Self-pay

## 2017-12-17 ENCOUNTER — Emergency Department (HOSPITAL_COMMUNITY): Payer: Medicare Other

## 2017-12-17 ENCOUNTER — Inpatient Hospital Stay (HOSPITAL_COMMUNITY)
Admission: EM | Admit: 2017-12-17 | Discharge: 2017-12-20 | DRG: 066 | Disposition: A | Discharge status: Rehabilitation Facility | Payer: Medicare Other | Attending: Internal Medicine | Admitting: Internal Medicine

## 2017-12-17 DIAGNOSIS — F411 Generalized anxiety disorder: Secondary | ICD-10-CM | POA: Diagnosis present

## 2017-12-17 DIAGNOSIS — I69393 Ataxia following cerebral infarction: Secondary | ICD-10-CM | POA: Diagnosis not present

## 2017-12-17 DIAGNOSIS — I639 Cerebral infarction, unspecified: Secondary | ICD-10-CM | POA: Diagnosis not present

## 2017-12-17 DIAGNOSIS — Z8349 Family history of other endocrine, nutritional and metabolic diseases: Secondary | ICD-10-CM | POA: Diagnosis not present

## 2017-12-17 DIAGNOSIS — Z882 Allergy status to sulfonamides status: Secondary | ICD-10-CM | POA: Diagnosis not present

## 2017-12-17 DIAGNOSIS — E876 Hypokalemia: Secondary | ICD-10-CM

## 2017-12-17 DIAGNOSIS — Z79890 Hormone replacement therapy (postmenopausal): Secondary | ICD-10-CM | POA: Diagnosis not present

## 2017-12-17 DIAGNOSIS — Z91048 Other nonmedicinal substance allergy status: Secondary | ICD-10-CM | POA: Diagnosis not present

## 2017-12-17 DIAGNOSIS — I5030 Unspecified diastolic (congestive) heart failure: Secondary | ICD-10-CM | POA: Diagnosis not present

## 2017-12-17 DIAGNOSIS — R1312 Dysphagia, oropharyngeal phase: Secondary | ICD-10-CM | POA: Diagnosis present

## 2017-12-17 DIAGNOSIS — I5189 Other ill-defined heart diseases: Secondary | ICD-10-CM

## 2017-12-17 DIAGNOSIS — R4701 Aphasia: Secondary | ICD-10-CM | POA: Diagnosis present

## 2017-12-17 DIAGNOSIS — I6322 Cerebral infarction due to unspecified occlusion or stenosis of basilar arteries: Secondary | ICD-10-CM | POA: Diagnosis not present

## 2017-12-17 DIAGNOSIS — E119 Type 2 diabetes mellitus without complications: Secondary | ICD-10-CM | POA: Diagnosis present

## 2017-12-17 DIAGNOSIS — Z888 Allergy status to other drugs, medicaments and biological substances status: Secondary | ICD-10-CM | POA: Diagnosis not present

## 2017-12-17 DIAGNOSIS — E1140 Type 2 diabetes mellitus with diabetic neuropathy, unspecified: Secondary | ICD-10-CM | POA: Diagnosis present

## 2017-12-17 DIAGNOSIS — I69322 Dysarthria following cerebral infarction: Secondary | ICD-10-CM | POA: Diagnosis not present

## 2017-12-17 DIAGNOSIS — M1990 Unspecified osteoarthritis, unspecified site: Secondary | ICD-10-CM | POA: Diagnosis present

## 2017-12-17 DIAGNOSIS — R471 Dysarthria and anarthria: Secondary | ICD-10-CM | POA: Diagnosis present

## 2017-12-17 DIAGNOSIS — Z833 Family history of diabetes mellitus: Secondary | ICD-10-CM | POA: Diagnosis not present

## 2017-12-17 DIAGNOSIS — E785 Hyperlipidemia, unspecified: Secondary | ICD-10-CM | POA: Diagnosis present

## 2017-12-17 DIAGNOSIS — I6350 Cerebral infarction due to unspecified occlusion or stenosis of unspecified cerebral artery: Secondary | ICD-10-CM | POA: Diagnosis not present

## 2017-12-17 DIAGNOSIS — Z791 Long term (current) use of non-steroidal anti-inflammatories (NSAID): Secondary | ICD-10-CM

## 2017-12-17 DIAGNOSIS — I160 Hypertensive urgency: Secondary | ICD-10-CM | POA: Diagnosis not present

## 2017-12-17 DIAGNOSIS — Z79899 Other long term (current) drug therapy: Secondary | ICD-10-CM

## 2017-12-17 DIAGNOSIS — I701 Atherosclerosis of renal artery: Secondary | ICD-10-CM | POA: Diagnosis present

## 2017-12-17 DIAGNOSIS — R29702 NIHSS score 2: Secondary | ICD-10-CM | POA: Diagnosis present

## 2017-12-17 DIAGNOSIS — I69321 Dysphasia following cerebral infarction: Secondary | ICD-10-CM | POA: Diagnosis not present

## 2017-12-17 DIAGNOSIS — I69320 Aphasia following cerebral infarction: Secondary | ICD-10-CM | POA: Diagnosis present

## 2017-12-17 DIAGNOSIS — E871 Hypo-osmolality and hyponatremia: Secondary | ICD-10-CM | POA: Diagnosis not present

## 2017-12-17 DIAGNOSIS — R0989 Other specified symptoms and signs involving the circulatory and respiratory systems: Secondary | ICD-10-CM | POA: Diagnosis not present

## 2017-12-17 DIAGNOSIS — I69391 Dysphagia following cerebral infarction: Secondary | ICD-10-CM | POA: Diagnosis not present

## 2017-12-17 DIAGNOSIS — Z823 Family history of stroke: Secondary | ICD-10-CM | POA: Diagnosis not present

## 2017-12-17 DIAGNOSIS — I10 Essential (primary) hypertension: Secondary | ICD-10-CM | POA: Diagnosis not present

## 2017-12-17 DIAGNOSIS — F419 Anxiety disorder, unspecified: Secondary | ICD-10-CM | POA: Diagnosis present

## 2017-12-17 DIAGNOSIS — I651 Occlusion and stenosis of basilar artery: Secondary | ICD-10-CM | POA: Diagnosis not present

## 2017-12-17 DIAGNOSIS — R4781 Slurred speech: Secondary | ICD-10-CM | POA: Diagnosis not present

## 2017-12-17 DIAGNOSIS — E039 Hypothyroidism, unspecified: Secondary | ICD-10-CM | POA: Diagnosis present

## 2017-12-17 DIAGNOSIS — Z8249 Family history of ischemic heart disease and other diseases of the circulatory system: Secondary | ICD-10-CM | POA: Diagnosis not present

## 2017-12-17 DIAGNOSIS — Z66 Do not resuscitate: Secondary | ICD-10-CM | POA: Diagnosis present

## 2017-12-17 DIAGNOSIS — Z7982 Long term (current) use of aspirin: Secondary | ICD-10-CM | POA: Diagnosis not present

## 2017-12-17 DIAGNOSIS — R42 Dizziness and giddiness: Secondary | ICD-10-CM | POA: Diagnosis not present

## 2017-12-17 DIAGNOSIS — R339 Retention of urine, unspecified: Secondary | ICD-10-CM | POA: Diagnosis present

## 2017-12-17 DIAGNOSIS — M1711 Unilateral primary osteoarthritis, right knee: Secondary | ICD-10-CM | POA: Diagnosis present

## 2017-12-17 HISTORY — DX: Generalized anxiety disorder: F41.1

## 2017-12-17 HISTORY — DX: Cerebral infarction, unspecified: I63.9

## 2017-12-17 LAB — COMPREHENSIVE METABOLIC PANEL
ALK PHOS: 51 U/L (ref 38–126)
ALT: 14 U/L (ref 0–44)
AST: 25 U/L (ref 15–41)
Albumin: 4.3 g/dL (ref 3.5–5.0)
Anion gap: 13 (ref 5–15)
BUN: 11 mg/dL (ref 8–23)
CO2: 24 mmol/L (ref 22–32)
CREATININE: 0.79 mg/dL (ref 0.44–1.00)
Calcium: 9.7 mg/dL (ref 8.9–10.3)
Chloride: 101 mmol/L (ref 98–111)
GFR calc Af Amer: >60 mL/min (ref >60)
GLUCOSE: 150 mg/dL — AB (ref 70–99)
POTASSIUM: 3.4 mmol/L — AB (ref 3.5–5.1)
Sodium: 138 mmol/L (ref 135–145)
TOTAL PROTEIN: 7.6 g/dL (ref 6.5–8.1)
Total Bilirubin: 1 mg/dL (ref 0.3–1.2)

## 2017-12-17 LAB — URINALYSIS, ROUTINE W REFLEX MICROSCOPIC
BACTERIA UA: NONE SEEN
BILIRUBIN URINE: NEGATIVE
Glucose, UA: NEGATIVE mg/dL
Hgb urine dipstick: NEGATIVE
Ketones, ur: 80 mg/dL — AB
Leukocytes, UA: NEGATIVE
Nitrite: NEGATIVE
PH: 5 (ref 5.0–8.0)
Protein, ur: 30 mg/dL — AB
SPECIFIC GRAVITY, URINE: 1.021 (ref 1.005–1.030)

## 2017-12-17 LAB — DIFFERENTIAL
ABS IMMATURE GRANULOCYTES: 0.04 10*3/uL (ref 0.00–0.07)
BASOS PCT: 1 %
Basophils Absolute: 0.1 10*3/uL (ref 0.0–0.1)
Eosinophils Absolute: 0 10*3/uL (ref 0.0–0.5)
Eosinophils Relative: 1 %
Immature Granulocytes: 1 %
LYMPHS PCT: 20 %
Lymphs Abs: 1.6 10*3/uL (ref 0.7–4.0)
MONO ABS: 0.7 10*3/uL (ref 0.1–1.0)
MONOS PCT: 8 %
NEUTROS ABS: 5.5 10*3/uL (ref 1.7–7.7)
NEUTROS PCT: 69 %

## 2017-12-17 LAB — PROTIME-INR
INR: 1.09
Prothrombin Time: 14 seconds (ref 11.4–15.2)

## 2017-12-17 LAB — CBC
HEMATOCRIT: 43 % (ref 36.0–46.0)
HEMOGLOBIN: 14.3 g/dL (ref 12.0–15.0)
MCH: 27.9 pg (ref 26.0–34.0)
MCHC: 33.3 g/dL (ref 30.0–36.0)
MCV: 84 fL (ref 80.0–100.0)
Platelets: 323 10*3/uL (ref 150–400)
RBC: 5.12 MIL/uL — AB (ref 3.87–5.11)
RDW: 12.8 % (ref 11.5–15.5)
WBC: 7.9 10*3/uL (ref 4.0–10.5)
nRBC: 0 % (ref 0.0–0.2)

## 2017-12-17 LAB — I-STAT TROPONIN, ED: TROPONIN I, POC: 0 ng/mL (ref 0.00–0.08)

## 2017-12-17 LAB — RAPID URINE DRUG SCREEN, HOSP PERFORMED
Amphetamines: NOT DETECTED
BARBITURATES: NOT DETECTED
Benzodiazepines: POSITIVE — AB
COCAINE: NOT DETECTED
Opiates: NOT DETECTED
TETRAHYDROCANNABINOL: NOT DETECTED

## 2017-12-17 LAB — CBG MONITORING, ED: Glucose-Capillary: 135 mg/dL — ABNORMAL HIGH (ref 70–99)

## 2017-12-17 LAB — ETHANOL: Alcohol, Ethyl (B): <10 mg/dL (ref <10)

## 2017-12-17 LAB — APTT: APTT: 31 s (ref 24–36)

## 2017-12-17 MED ORDER — IOPAMIDOL (ISOVUE-370) INJECTION 76%
50.0000 mL | Freq: Once | INTRAVENOUS | Status: AC | PRN
  Administered 2017-12-17: 50 mL via INTRAVENOUS

## 2017-12-17 MED ORDER — ACETAMINOPHEN 325 MG PO TABS
650.0000 mg | ORAL_TABLET | ORAL | Status: DC | PRN
  Administered 2017-12-18 – 2017-12-20 (×2): 650 mg via ORAL
  Filled 2017-12-17 (×2): qty 2

## 2017-12-17 MED ORDER — ACETAMINOPHEN 160 MG/5ML PO SOLN: 650.0000 mg | ORAL | Status: DC | PRN

## 2017-12-17 MED ORDER — ATENOLOL 25 MG PO TABS: 25.0000 mg | ORAL_TABLET | Freq: Every day | ORAL | Status: DC

## 2017-12-17 MED ORDER — LORAZEPAM 1 MG PO TABS
1.0000 mg | ORAL_TABLET | Freq: Three times a day (TID) | ORAL | Status: DC | PRN
  Administered 2017-12-18 – 2017-12-20 (×4): 1 mg via ORAL
  Filled 2017-12-17 (×4): qty 1

## 2017-12-17 MED ORDER — ORAL CARE MOUTH RINSE
15.0000 mL | Freq: Two times a day (BID) | OROMUCOSAL | Status: DC
  Administered 2017-12-19 (×2): 15 mL via OROMUCOSAL

## 2017-12-17 MED ORDER — NON FORMULARY: 25.0000 mg | Freq: Every day | Status: DC

## 2017-12-17 MED ORDER — LORAZEPAM 2 MG/ML IJ SOLN
1.0000 mg | Freq: Once | INTRAMUSCULAR | Status: AC | PRN
  Administered 2017-12-18: 1 mg via INTRAVENOUS
  Filled 2017-12-17: qty 1

## 2017-12-17 MED ORDER — SODIUM CHLORIDE 0.9 % IV SOLN
INTRAVENOUS | Status: DC
  Administered 2017-12-17: 23:00:00 via INTRAVENOUS

## 2017-12-17 MED ORDER — ASPIRIN 325 MG PO TABS
325.0000 mg | ORAL_TABLET | Freq: Every day | ORAL | Status: DC
  Administered 2017-12-18 – 2017-12-20 (×3): 325 mg via ORAL
  Filled 2017-12-17 (×3): qty 1

## 2017-12-17 MED ORDER — ENOXAPARIN SODIUM 40 MG/0.4ML ~~LOC~~ SOLN
40.0000 mg | SUBCUTANEOUS | Status: DC
  Administered 2017-12-17 – 2017-12-19 (×3): 40 mg via SUBCUTANEOUS
  Filled 2017-12-17 (×3): qty 0.4

## 2017-12-17 MED ORDER — NONFORMULARY OR COMPOUNDED ITEM
25.0000 mg | Freq: Every day | Status: DC
  Filled 2017-12-17: qty 1

## 2017-12-17 MED ORDER — LEVOTHYROXINE SODIUM 100 MCG PO TABS
100.0000 ug | ORAL_TABLET | Freq: Every day | ORAL | Status: DC
  Filled 2017-12-17: qty 1

## 2017-12-17 MED ORDER — SENNOSIDES-DOCUSATE SODIUM 8.6-50 MG PO TABS: 1.0000 | ORAL_TABLET | Freq: Every evening | ORAL | Status: DC | PRN

## 2017-12-17 MED ORDER — STROKE: EARLY STAGES OF RECOVERY BOOK
Freq: Once | Status: DC
  Filled 2017-12-17: qty 1

## 2017-12-17 MED ORDER — NONFORMULARY OR COMPOUNDED ITEM
100.0000 ug | Status: DC
  Administered 2017-12-19 – 2017-12-20 (×2): 100 ug via ORAL
  Filled 2017-12-17 (×3): qty 1

## 2017-12-17 MED ORDER — ACETAMINOPHEN 650 MG RE SUPP
650.0000 mg | RECTAL | Status: DC | PRN
  Filled 2017-12-17: qty 1

## 2017-12-17 MED ORDER — ASPIRIN 300 MG RE SUPP
300.0000 mg | Freq: Every day | RECTAL | Status: DC
  Filled 2017-12-17 (×3): qty 1

## 2017-12-17 MED ORDER — STROKE: EARLY STAGES OF RECOVERY BOOK
Freq: Once | Status: AC
  Administered 2017-12-17: 23:00:00
  Filled 2017-12-17: qty 1

## 2017-12-17 MED ORDER — LORAZEPAM 2 MG/ML IJ SOLN
1.0000 mg | Freq: Once | INTRAMUSCULAR | Status: AC | PRN
  Administered 2017-12-17: 1 mg via INTRAVENOUS
  Filled 2017-12-17: qty 1

## 2017-12-17 MED ORDER — IOPAMIDOL (ISOVUE-370) INJECTION 76%
INTRAVENOUS | Status: AC
  Filled 2017-12-17: qty 50

## 2017-12-17 NOTE — ED Notes (Signed)
Casimiro Needle PA to bedside to update patient and family

## 2017-12-17 NOTE — ED Triage Notes (Signed)
Pt arrives for eval of slurred speech since yesterday. Pt has slow delayed speech but is alert and oriented. Per son, he noticed it yesterday on the phone and thought she had "taken too much ativan" the pt states she took 1 ativan yesterday and has not had any today. Pt has no drift and weakness to extremities.

## 2017-12-17 NOTE — ED Notes (Signed)
hospitalist at bedside

## 2017-12-17 NOTE — H&P (Signed)
History and Physical    Brandy Flowers EXB:[id number] DOB: [birth year] DOA: 12/17/2017  Referring MD/NP/PA: EDP Patient coming from: Home  Chief Complaint: slurred speech  HPI: Brandy Flowers is a 77 y.o. female with medical history significant of HTN, Anxiety, DM, Hypothyroidism, Renal artery stenosis, was brought to the ED by her brother due to worsening speech difficulty since yesterday afternoon. -Most of the history is provided by patient's brother who reports that a friend had called patient yesterday afternoon and reported to him noticing some change in her speech, he went home later yesterday to check on her and thought it was perhaps related to her Ativan use, and later in the day he felt his speech was a little better.  This morning her speech was worse and hence he brought her to the emergency room to be evaluated.  Patient denies any weakness in her arms or legs, no numbness, no headache fevers or chills. -No changes in her chronic medications  ED Course: CT head negative for acute findings, MRI was positive for acute bilateral pontine ischemic strokes  Review of Systems: As per HPI otherwise 14 point review of systems negative.   Past Medical History:  Diagnosis Date  . Anxiety   . Arthritis   . Diabetes mellitus without complication (HCC)   . Hypertension   . Sinus problem   . Thyroid disease     Past Surgical History:  Procedure Laterality Date  . BREAST SURGERY    . EYE SURGERY       reports that she has never smoked. She has never used smokeless tobacco. She reports that she does not drink alcohol or use drugs.  Allergies  Allergen Reactions  . Ace Inhibitors Nausea And Vomiting  . Norvasc [Amlodipine] Nausea And Vomiting  . Blue Dyes (Parenteral)   . Sulfa Antibiotics     Family History  Problem Relation Age of Onset  . Stroke Mother   . Diabetes Mother   . Heart disease Mother        Before age 67  . Hypertension Mother   . Hyperlipidemia Mother     . Heart disease Father   . Heart attack Father   . Heart disease Brother        After age 9- CABG  . Hypertension Brother      Prior to Admission medications   Medication Sig Start Date End Date Taking? Authorizing Provider  acetaminophen-codeine (TYLENOL #3) 300-30 MG per tablet Take by mouth as needed for moderate pain.     [provider]  aMILoride (MIDAMOR) 5 MG tablet  12/14/13   [provider]  amoxicillin (AMOXIL) 500 MG capsule  03/20/13   [provider]  amoxicillin-clavulanate (AUGMENTIN) 875-125 MG per tablet  03/23/13   [provider]  atenolol (TENORMIN) 100 MG tablet Take 100 mg by mouth daily.    [provider]  azithromycin (ZITHROMAX) 250 MG tablet  03/21/13   [provider]  escitalopram (LEXAPRO) 5 MG tablet  03/02/13   [provider]  ketoconazole (NIZORAL) 2 % cream  07/21/13   [provider]  levothyroxine (SYNTHROID, LEVOTHROID) 75 MCG tablet Take 100 mcg by mouth daily before breakfast.     [provider]  lidocaine (LIDODERM) 5 %  02/14/13   [provider]  LORazepam (ATIVAN) 1 MG tablet Take 1 mg by mouth 3 (three) times daily as needed. 02/23/14   [provider]  meloxicam (MOBIC) 15 MG tablet Take 15  mg by mouth daily.    [provider]  oxyCODONE (OXY IR/ROXICODONE) 5 MG immediate release tablet  05/23/13   [provider]  predniSONE (STERAPRED UNI-PAK) 5 MG TABS tablet  09/06/13   [provider]  spironolactone (ALDACTONE) 25 MG tablet  02/08/13   [provider]  SYNTHROID 50 MCG tablet  01/26/17   [provider]  tiZANidine (ZANAFLEX) 2 MG tablet  08/07/13   [provider]  triamcinolone cream (KENALOG) 0.1 %  07/21/13   [provider]    Physical Exam: Vitals:   12/17/17 1500 12/17/17 1515 12/17/17 1615 12/17/17 1630  BP: (!) 161/64 (!) 153/58 (!) 161/53 (!) 174/54  Pulse: 62 (!) 58  66   Resp: 14 13  15   Temp:      SpO2: 97% 97%  98%      Constitutional: NAD, calm, comfortable, anxious , laying in bed, no distress Vitals:   12/17/17 1500 12/17/17 1515 12/17/17 1615 12/17/17 1630  BP: (!) 161/64 (!) 153/58 (!) 161/53 (!) 174/54  Pulse: 62 (!) 58  66  Resp: 14 13  15   Temp:      SpO2: 97% 97%  98%   Eyes: PERRL, lids and conjunctivae normal ENMT: Mucous membranes are moist.  Neck: normal, supple Respiratory: clear to auscultation bilaterally Cardiovascular: Regular rate and rhythm, no murmurs / rubs / gallops Abdomen: soft, non tender, Bowel sounds positive.  Musculoskeletal: No joint deformity upper and lower extremities. Ext: No edema Skin: no rashes, lesions, ulcers.  Neurologic: Higher functions intact, some aphasia noted, motor 4+/5 in both upper except remedies and lower extremities, sensations, light touch intact, cranial nerves II 12 are grossly intact, DTR 1+, plantars are withdrawal Psychiatric: Anxious  Labs on Admission: I have personally reviewed following labs and imaging studies  CBC: Recent Labs  Lab 12/17/17 0924  WBC 7.9  NEUTROABS 5.5  HGB 14.3  HCT 43.0  MCV 84.0  PLT 323   Basic Metabolic Panel: Recent Labs  Lab 12/17/17 0924  NA 138  K 3.4*  CL 101  CO2 24  GLUCOSE 150*  BUN 11  CREATININE 0.79  CALCIUM 9.7   GFR: CrCl cannot be calculated (Unknown ideal weight.). Liver Function Tests: Recent Labs  Lab 12/17/17 0924  AST 25  ALT 14  ALKPHOS 51  BILITOT 1.0  PROT 7.6  ALBUMIN 4.3   No results for input(s): LIPASE, AMYLASE in the last 168 hours. No results for input(s): AMMONIA in the last 168 hours. Coagulation Profile: Recent Labs  Lab 12/17/17 0924  INR 1.09   Cardiac Enzymes: No results for input(s): CKTOTAL, CKMB, CKMBINDEX, TROPONINI in the last 168 hours. BNP (last 3 results) No results for input(s): PROBNP in the last 8760 hours. HbA1C: No results for input(s): HGBA1C in the last 72  hours. CBG: Recent Labs  Lab 12/17/17 0941  GLUCAP 135*   Lipid Profile: No results for input(s): CHOL, HDL, LDLCALC, TRIG, CHOLHDL, LDLDIRECT in the last 72 hours. Thyroid Function Tests: No results for input(s): TSH, T4TOTAL, FREET4, T3FREE, THYROIDAB in the last 72 hours. Anemia Panel: No results for input(s): VITAMINB12, FOLATE, FERRITIN, TIBC, IRON, RETICCTPCT in the last 72 hours. Urine analysis:    Component Value Date/Time   COLORURINE YELLOW 12/17/2017 1440   APPEARANCEUR CLEAR 12/17/2017 1440   LABSPEC 1.021 12/17/2017 1440   PHURINE 5.0 12/17/2017 1440   GLUCOSEU NEGATIVE 12/17/2017 1440   HGBUR NEGATIVE 12/17/2017 1440   BILIRUBINUR NEGATIVE 12/17/2017 1440  KETONESUR 80 (A) 12/17/2017 1440   PROTEINUR 30 (A) 12/17/2017 1440   NITRITE NEGATIVE 12/17/2017 1440   LEUKOCYTESUR NEGATIVE 12/17/2017 1440   Sepsis Labs: @LABRCNTIP (procalcitonin:4,lacticidven:4) )No results found for this or any previous visit (from the past 240 hour(s)).   Radiological Exams on Admission: Ct Head Wo Contrast  Result Date: 12/17/2017 CLINICAL DATA:  Slurred speech since yesterday. EXAM: CT HEAD WITHOUT CONTRAST TECHNIQUE: Contiguous axial images were obtained from the base of the skull through the vertex without intravenous contrast. COMPARISON:  None. FINDINGS: Brain: No evidence of acute infarction, hemorrhage, hydrocephalus, extra-axial collection or mass lesion/mass effect. Moderate brain parenchymal volume loss. Minimal deep white matter microangiopathy. Vascular: Calcific atherosclerotic disease at the skull base. Skull: Normal. Negative for fracture or focal lesion. Sinuses/Orbits: Polypoid mucosal thickening with chronic periosteal reaction of the left maxillary sinus, suggestive of chronic sinusitis. Milder polypoid mucosal thickening of the ethmoid sinuses. Other: None. IMPRESSION: No acute intracranial abnormality. Chronic left maxillary sinusitis.  Mild bilateral ethmoid sinusitis.  Electronically Signed   By: Ted Mcalpine M.D.   On: 12/17/2017 10:47   Mr Brain Wo Contrast (neuro Protocol)  Result Date: 12/17/2017 CLINICAL DATA:  Focal neuro deficit, greater than 6 hours stroke suspected. Slurred speech beginning yesterday. EXAM: MRI HEAD WITHOUT CONTRAST TECHNIQUE: Multiplanar, multiecho pulse sequences of the brain and surrounding structures were obtained without intravenous contrast. COMPARISON:  CT head without contrast 12/17/2017 FINDINGS: Brain: Bilateral acute nonhemorrhagic infarcts are noted in the anterior pons. Right paramedian infarct slightly larger than the left. Associated T2 signal changes are evident. Mild white matter changes are otherwise within normal limits for age. Ventricles are of normal size. No significant extra-axial fluid collection is present. Vascular: Flow is present in the major intracranial arteries. Skull and upper cervical spine: The skull base is within normal limits. Craniocervical junction is normal. The upper cervical spine is within limits. Sinuses/Orbits: Mild mucosal thickening is present posteriorly in the left maxillary sinus. The remaining paranasal sinuses and the mastoid air cells are clear. Bilateral lens replacements are present. Globes and orbits are within normal limits. IMPRESSION: 1. 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 2. No evidence for other acute or remote infarcts. 3. Mild left maxillary sinus disease. Electronically Signed   By: Marin Roberts M.D.   On: 12/17/2017 16:25    EKG: Independently reviewed EKG, sinus rhythm, no acute ST-T wave changes  Assessment/Plan  Acute bilateral pontine strokes -?  Embolic etiology -Admit to telemetry -Aspirin 325 mg daily -Check MRA brain, 2D echocardiogram and carotid duplex -Check LDL and hemoglobin A1c -Neurology consulted -PT/OT/SLP evaluations    Type 2 diabetes, controlled, with neuropathy (HCC) -On medications, likely diet  controlled, follow-up hemoglobin A1c    Hypertension -Hold spironolactone and amiloride -Resume atenolol at a lower dose to prevent withdrawal tachycardia -Permissive hypertension for next 2 to 3 days, the hydralazine PRN for BP greater than 220/120    Left renal artery stenosis (HCC) -H/o  50 to 60% left renal artery stenosis followed by Dr. Darrick Penna -On medical therapy with antihypertensives    Anxiety -Severe baseline anxiety on 1 mg Ativan 4 times daily as needed -Resumed   DVT prophylaxis: Lovenox Code Status: DNR Family Communication: Brother at bedside Disposition Plan: Home pending above work-up Consults called: Neurology Admission status: Inpatient given bilateral pontine ischemic strokes  Zannie Cove MD [facility]  If 7PM-7AM, please contact night-coverage [email or web address] Password [email or web address]  12/17/2017, 5:10 PM

## 2017-12-17 NOTE — ED Notes (Signed)
Patient transported to MRI 

## 2017-12-17 NOTE — Consult Note (Addendum)
NEURO HOSPITALIST  CONSULT    Requesting Physician: Dr. Anitra Lauth    Chief Complaint: slurred speech  History obtained from:  Patient   HPI:                                                                                                                                         Brandy Flowers is an 77 y.o. female  With PMH significant for HTN, DM, anxiety, and renal artery stenosis who presented to [facility] ED with slurred speech since yesterday 12/16/17.  Per patient and brother yesterday about maybe 0900 patient states she had a sudden onset of dizziness, trouble speaking and trouble walking. She spoke on the phone with her brother about noon and he noticed that she had some slurred speech. At this time he was not concerned because he thought she had just taken too much ativan, like she has done in the past. When he called her to check after work, he says that her speech was better, but this morning her slurred speech was worse. He also had to help assist her into the car. She is normally independent and lives alone with her yorkie. Denies any CP, SOB, diplopia, blurry vision, any focal weakness. Endorses bilateral leg weakness and a HA that was relieved with ASA last night. Patient is prescribed ativan for her anxiety. No prior stroke history noted.   ED course:  MRI: acute/subacute pontine nonhemorrhagic infarcts. CT head: no hemorrhage UDS: + benzodiazepines ETOH: WNL BG: 135 BP: 174/54   Date last known well: Date: 12/16/2017 Time last known well: Time: 09:00 tPA Given: No: outside of window  Modified Rankin: Rankin Score=0  NIHSS:2 1dysarthria 1 left arm drift  Past Medical History:  Diagnosis Date  . Anxiety   . Arthritis   . Diabetes mellitus without complication (HCC)   . Hypertension   . Sinus problem   . Thyroid disease     Past Surgical History:  Procedure Laterality Date  . BREAST SURGERY    . EYE SURGERY      Family  History  Problem Relation Age of Onset  . Stroke Mother   . Diabetes Mother   . Heart disease Mother        Before age 61  . Hypertension Mother   . Hyperlipidemia Mother   . Heart disease Father   . Heart attack Father   . Heart disease Brother        After age 44- CABG  . Hypertension Brother         Social History:  reports that she has never smoked. She has never used smokeless tobacco.  She reports that she does not drink alcohol or use drugs.  Allergies:  Allergies  Allergen Reactions  . Ace Inhibitors Nausea And Vomiting  . Norvasc [Amlodipine] Nausea And Vomiting  . Blue Dyes (Parenteral)   . Sulfa Antibiotics     Medications:                                                                                                                          Current Facility-Administered Medications  Medication Dose Route Frequency Provider Last Rate Last Dose  . LORazepam (ATIVAN) injection 1 mg  1 mg Intravenous Once PRN Jacinto Halim, PA-C       Current Outpatient Medications  Medication Sig Dispense Refill  . acetaminophen-codeine (TYLENOL #3) 300-30 MG per tablet Take by mouth as needed for moderate pain.     Marland Kitchen aMILoride (MIDAMOR) 5 MG tablet   3  . amoxicillin (AMOXIL) 500 MG capsule     . amoxicillin-clavulanate (AUGMENTIN) 875-125 MG per tablet     . atenolol (TENORMIN) 100 MG tablet Take 100 mg by mouth daily.    Marland Kitchen azithromycin (ZITHROMAX) 250 MG tablet     . escitalopram (LEXAPRO) 5 MG tablet     . ketoconazole (NIZORAL) 2 % cream     . levothyroxine (SYNTHROID, LEVOTHROID) 75 MCG tablet Take 100 mcg by mouth daily before breakfast.     . lidocaine (LIDODERM) 5 %     . LORazepam (ATIVAN) 1 MG tablet Take 1 mg by mouth 3 (three) times daily as needed.  5  . meloxicam (MOBIC) 15 MG tablet Take 15 mg by mouth daily.    Marland Kitchen oxyCODONE (OXY IR/ROXICODONE) 5 MG immediate release tablet     . predniSONE (STERAPRED UNI-PAK) 5 MG TABS tablet     . spironolactone (ALDACTONE)  25 MG tablet     . SYNTHROID 50 MCG tablet     . tiZANidine (ZANAFLEX) 2 MG tablet     . triamcinolone cream (KENALOG) 0.1 %        ROS:                                                                                                                                       ROS was performed and is negative except as noted in HPI   General Examination:  Blood pressure (!) 153/58, pulse (!) 58, temperature 98.5 F (36.9 C), resp. rate 13, SpO2 97 %.  HEENT-  Normocephalic, no lesions, without obvious abnormality.  Normal external eye and conjunctiva.  Cardiovascular- S1-S2 audible, pulses palpable throughout  Lungs-no rhonchi or wheezing noted, no excessive working breathing.  Saturations within normal limits on RA Abdomen- All 4 quadrants palpated and nontender Extremities- Warm, dry and intact Musculoskeletal-no joint tenderness, deformity or swelling Skin-warm and dry, no hyperpigmentation, vitiligo, or suspicious lesions  Neurological Examination Mental Status: Alert, oriented to person/place/age/ month/year, thought content appropriate.  Speech fluent without evidence of aphasia.  Dysarthria noted. Able to follow commands without difficulty. Cranial Nerves: II:  Visual fields grossly normal,  III,IV, VI: ptosis not present, extra-ocular motions intact bilaterally, pupils equal, round, reactive to light and accommodation V,VII: smile with mild right facial weakness, facial light touch sensation normal bilaterally VIII: hearing normal bilaterally IX,X: uvula rises symmetrically XI: bilateral shoulder shrug XII: midline tongue extension Motor: Right : Upper extremity   4+/5 Left:     Upper extremity   5/5  Lower extremity   5/5  Lower extremity   5/5 Tone and bulk:normal tone throughout; no atrophy noted Sensory:  light touch intact throughout, bilaterally Deep Tendon Reflexes: 2+  and symmetric biceps and patella Plantars: Right: upgoing   Left: upgoing Cerebellar: She has mild ataxia on finger-nose-finger on the right  Gait: deferred   Lab Results: Basic Metabolic Panel: Recent Labs  Lab 12/17/17 0924  NA 138  K 3.4*  CL 101  CO2 24  GLUCOSE 150*  BUN 11  CREATININE 0.79  CALCIUM 9.7    CBC: Recent Labs  Lab 12/17/17 0924  WBC 7.9  NEUTROABS 5.5  HGB 14.3  HCT 43.0  MCV 84.0  PLT 323    CBG: Recent Labs  Lab 12/17/17 0941  GLUCAP 135*    Imaging: Ct Head Wo Contrast  Result Date: 12/17/2017 CLINICAL DATA:  Slurred speech since yesterday. EXAM: CT HEAD WITHOUT CONTRAST TECHNIQUE: Contiguous axial images were obtained from the base of the skull through the vertex without intravenous contrast. COMPARISON:  None. FINDINGS: Brain: No evidence of acute infarction, hemorrhage, hydrocephalus, extra-axial collection or mass lesion/mass effect. Moderate brain parenchymal volume loss. Minimal deep white matter microangiopathy. Vascular: Calcific atherosclerotic disease at the skull base. Skull: Normal. Negative for fracture or focal lesion. Sinuses/Orbits: Polypoid mucosal thickening with chronic periosteal reaction of the left maxillary sinus, suggestive of chronic sinusitis. Milder polypoid mucosal thickening of the ethmoid sinuses. Other: None. IMPRESSION: No acute intracranial abnormality. Chronic left maxillary sinusitis.  Mild bilateral ethmoid sinusitis. Electronically Signed   By: Ted Mcalpine M.D.   On: 12/17/2017 10:47   Mr Brain Wo Contrast (neuro Protocol)  Result Date: 12/17/2017 CLINICAL DATA:  Focal neuro deficit, greater than 6 hours stroke suspected. Slurred speech beginning yesterday. EXAM: MRI HEAD WITHOUT CONTRAST TECHNIQUE: Multiplanar, multiecho pulse sequences of the brain and surrounding structures were obtained without intravenous contrast. COMPARISON:  CT head without contrast 12/17/2017 FINDINGS: Brain: Bilateral acute  nonhemorrhagic infarcts are noted in the anterior pons. Right paramedian infarct slightly larger than the left. Associated T2 signal changes are evident. Mild white matter changes are otherwise within normal limits for age. Ventricles are of normal size. No significant extra-axial fluid collection is present. Vascular: Flow is present in the major intracranial arteries. Skull and upper cervical spine: The skull base is within normal limits. Craniocervical junction is normal. The upper cervical spine is within limits.  Sinuses/Orbits: Mild mucosal thickening is present posteriorly in the left maxillary sinus. The remaining paranasal sinuses and the mastoid air cells are clear. Bilateral lens replacements are present. Globes and orbits are within normal limits. IMPRESSION: 1. 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 2. No evidence for other acute or remote infarcts. 3. Mild left maxillary sinus disease. Electronically Signed   By: Marin Roberts M.D.   On: 12/17/2017 16:25       Valentina Lucks, MSN, NP-C [facility] [phone]  12/17/2017, 4:30 PM   Attending physician note to follow with Assessment and plan . I have seen the patient and reviewed the above note.  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  She is being admitted for secondary risk factor modification and therapy.   Impression: Stroke Risk Factors - diabetes mellitus and hypertension Etiology : unknown, further workup needed.    Recommendations: -- BP goal : Permissive HTN upto 220/110 mmHg  --CTA head and neck --Echocardiogram -- ASA suppository for now, consider dual and platelet therapy if she passes swallow screen -- High intensity Statin if LDL > 70 -- HgbA1c, fasting lipid panel -- PT consult, OT consult, Speech consult --Telemetry monitoring --Frequent neuro checks --Stroke swallow  screen --please page stroke NP  Or  PA  Or MD from 8am -4 pm  as this patient from this time will be  followed by the stroke.   You can look them up on [email or web address]  Password [email or web address]  Ritta Slot, MD [facility] [phone]  If 7pm- 7am, please page neurology on call as listed in AMION.

## 2017-12-17 NOTE — ED Notes (Signed)
Pt put on bedpan to obtain urine sample. Unable to provide at this time.

## 2017-12-17 NOTE — ED Notes (Signed)
Kirkpatrick MD at bedside 

## 2017-12-17 NOTE — ED Provider Notes (Signed)
[facility] EMERGENCY DEPARTMENT Provider Note   CSN: [id number] Arrival date & time: 12/17/17  7846     History   Chief Complaint Chief Complaint  Patient presents with  . Aphasia    HPI Cyana Shook is a 77 y.o. female with a history of hypertension and diabetes who presents emergency department today for slurred speech.  Patients brother is at bedside as well as the patient help provide history.  Last seen normal was at 8 AM yesterday by brother.  Patient's brother noticed yesterday when calling around lunchtime that the patient had slurred speech and was complaining of dizziness.  He called her again at 5:30 PM and thought this had improved but was not sure.  He thought that the slurred speech was because "she had taken too much Ativan, like she has in the past".  The patient is prescribed PRN Ativan up to 3 mg/day.  The brother saw her today and noticed that she had right facial droop and had slow, delayed speech as well as a slow shuffling gait that is new for her.  The patient is unsure when her symptoms began.  She reports she only took 1 mg of Ativan yesterday night and none today.  She reports she feels weak.  Cannot specify where this is.  She denies focal weakness to her knowledge.  No numbness/tingling of the face or extremities.  She denies any visual field cuts or changes.  She does report dizziness with a mild to specify what this feels like.  Patient is not on any blood thinners.  No recent falls or head trauma.  No history of strokes.  No new medications. She denies cp, sob, cough, abdominal pain, N/V/D, or urinary symptoms.   HPI  Past Medical History:  Diagnosis Date  . Anxiety   . Arthritis   . Diabetes mellitus without complication (HCC)   . Hypertension   . Sinus problem   . Thyroid disease     Patient Active Problem List   Diagnosis Date Noted  . 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 Left renal artery stenosis (HCC) 12/05/2014  .  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 Hypertension 09/20/2013  . Hypothyroid 09/20/2013    Past Surgical History:  Procedure Laterality Date  . BREAST SURGERY    . EYE SURGERY       OB History   None      Home Medications    Prior to Admission medications   Medication Sig Start Date End Date Taking? Authorizing Provider  acetaminophen-codeine (TYLENOL #3) 300-30 MG per tablet Take by mouth as needed for moderate pain.     [provider]  aMILoride (MIDAMOR) 5 MG tablet  12/14/13   [provider]  amoxicillin (AMOXIL) 500 MG capsule  03/20/13   [provider]  amoxicillin-clavulanate (AUGMENTIN) 875-125 MG per tablet  03/23/13   [provider]  atenolol (TENORMIN) 100 MG tablet Take 100 mg by mouth daily.    [provider]  azithromycin (ZITHROMAX) 250 MG tablet  03/21/13   [provider]  escitalopram (LEXAPRO) 5 MG tablet  03/02/13   [provider]  ketoconazole (NIZORAL) 2 % cream  07/21/13   [provider]  levothyroxine (SYNTHROID, LEVOTHROID) 75 MCG tablet Take 100 mcg by mouth daily before breakfast.     [provider]  lidocaine (LIDODERM) 5 %  02/14/13   [provider]  LORazepam (ATIVAN) 1 MG tablet  Take 1 mg by mouth 3 (three) times daily as needed. 02/23/14   [provider]  meloxicam (MOBIC) 15 MG tablet Take 15 mg by mouth daily.    [provider]  oxyCODONE (OXY IR/ROXICODONE) 5 MG immediate release tablet  05/23/13   [provider]  predniSONE (STERAPRED UNI-PAK) 5 MG TABS tablet  09/06/13   [provider]  spironolactone (ALDACTONE) 25 MG tablet  02/08/13   [provider]  SYNTHROID 50 MCG tablet  01/26/17   [provider]  tiZANidine (ZANAFLEX) 2 MG tablet  08/07/13   [provider]  triamcinolone cream (KENALOG) 0.1 %  07/21/13   [provider]    Family History Family  History  Problem Relation Age of Onset  . Stroke Mother   . Diabetes Mother   . Heart disease Mother        Before age 17  . Hypertension Mother   . Hyperlipidemia Mother   . Heart disease Father   . Heart attack Father   . Heart disease Brother        After age 59- CABG  . Hypertension Brother     Social History Social History   Tobacco Use  . Smoking status: Never Smoker  . Smokeless tobacco: Never Used  Substance Use Topics  . Alcohol use: No  . Drug use: No     Allergies   Ace inhibitors; Norvasc [amlodipine]; Blue dyes (parenteral); and Sulfa antibiotics   Review of Systems Review of Systems  All other systems reviewed and are negative.    Physical Exam Updated Vital Signs BP (!) 178/64   Pulse 66   Temp 98.5 F (36.9 C)   Resp 18   SpO2 100%   Physical Exam  Constitutional: She appears well-developed and well-nourished.  HENT:  Head: Normocephalic and atraumatic.  Right Ear: External ear normal.  Left Ear: External ear normal.  Nose: Nose normal.  Mouth/Throat: Uvula is midline, oropharynx is clear and moist and mucous membranes are normal. No tonsillar exudate.  Eyes: Pupils are equal, round, and reactive to light. Right eye exhibits no discharge. Left eye exhibits no discharge. No scleral icterus.  Normal EOM. 2-3 beat horizontal nystagmus.   Neck: Trachea normal. Neck supple. No spinous process tenderness present. No neck rigidity. Normal range of motion present.  No c-spine ttp  Cardiovascular: Normal rate, regular rhythm and intact distal pulses.  No murmur heard. Pulses:      Radial pulses are 2+ on the right side, and 2+ on the left side.       Dorsalis pedis pulses are 2+ on the right side, and 2+ on the left side.       Posterior tibial pulses are 2+ on the right side, and 2+ on the left side.  No lower extremity swelling or edema. Calves symmetric in size bilaterally.  Pulmonary/Chest: Effort normal and breath sounds normal. She  exhibits no tenderness.  Abdominal: Soft. Bowel sounds are normal. There is no tenderness. There is no rigidity, no rebound and no guarding.  Musculoskeletal: She exhibits no edema.  Lymphadenopathy:    She has no cervical adenopathy.  Neurological: She is alert.  Mental Status:  Alert, oriented x 3. Slow, slurred speech. Able to follow 2 step commands  Cranial Nerves:  II:  Peripheral visual fields grossly normal, pupils equal, round, reactive to light III,IV, VI: ptosis not present, extra-ocular motions intact bilaterally. Noted horizontal nystagmus.  No rotary or vertical nystagmus V,VII:  Possible right facial droop. Eyebrows raise symmetric, facial light touch sensation equal VIII: hearing grossly normal to voice  X: uvula elevates symmetrically  XI: bilateral shoulder shrug symmetric and strong XII: midline tongue extension without fassiculations Motor:  Normal tone. Strong and equal grip strength and dorsiflexion/plantar flexion Sensory: Sensation intact to light touch in all extremities. Deep Tendon Reflexes: 2+ and symmetric in the biceps and patella Cerebellar: normal finger-to-nose with bilateral upper extremities. Normal heel-to -shin balance bilaterally of the lower extremity. Right upper extremity pronator drift. No lower extremity drift.  CV: distal pulses palpable throughout   Skin: Skin is warm and dry. No rash noted. She is not diaphoretic.  Psychiatric: She has a normal mood and affect.  Nursing note and vitals reviewed.    ED Treatments / Results  Labs (all labs ordered are listed, but only abnormal results are displayed) Labs Reviewed  CBC - Abnormal; Notable for the following components:      Result Value   RBC 5.12 (*)    All other components within normal limits  COMPREHENSIVE METABOLIC PANEL - Abnormal; Notable for the following components:   Potassium 3.4 (*)    Glucose, Bld 150 (*)    All other components within normal limits  CBG MONITORING, ED -  Abnormal; Notable for the following components:   Glucose-Capillary 135 (*)    All other components within normal limits  URINE CULTURE  PROTIME-INR  APTT  DIFFERENTIAL  ETHANOL  URINALYSIS, ROUTINE W REFLEX MICROSCOPIC  RAPID URINE DRUG SCREEN, HOSP PERFORMED  I-STAT TROPONIN, ED    EKG EKG Interpretation  Date/Time:  Saturday December 17 2017 09:15:15 EDT Ventricular Rate:  63 PR Interval:  168 QRS Duration: 72 QT Interval:  430 QTC Calculation: 440 R Axis:   57 Text Interpretation:  Normal sinus rhythm Septal infarct , age undetermined No previous tracing Confirmed by Gwyneth Sprout ([id number]) on 12/17/2017 9:40:46 AM   Radiology Ct Head Wo Contrast  Result Date: 12/17/2017 CLINICAL DATA:  Slurred speech since yesterday. EXAM: CT HEAD WITHOUT CONTRAST TECHNIQUE: Contiguous axial images were obtained from the base of the skull through the vertex without intravenous contrast. COMPARISON:  None. FINDINGS: Brain: No evidence of acute infarction, hemorrhage, hydrocephalus, extra-axial collection or mass lesion/mass effect. Moderate brain parenchymal volume loss. Minimal deep white matter microangiopathy. Vascular: Calcific atherosclerotic disease at the skull base. Skull: Normal. Negative for fracture or focal lesion. Sinuses/Orbits: Polypoid mucosal thickening with chronic periosteal reaction of the left maxillary sinus, suggestive of chronic sinusitis. Milder polypoid mucosal thickening of the ethmoid sinuses. Other: None. IMPRESSION: No acute intracranial abnormality. Chronic left maxillary sinusitis.  Mild bilateral ethmoid sinusitis. Electronically Signed   By: Ted Mcalpine M.D.   On: 12/17/2017 10:47    Procedures Procedures (including critical care time)  Medications Ordered in ED Medications  LORazepam (ATIVAN) injection 1 mg (has no administration in time range)  LORazepam (ATIVAN) injection 1 mg (has no administration in time range)     Initial Impression /  Assessment and Plan / ED Course  I have reviewed the triage vital signs and the nursing notes.  Pertinent labs & imaging results that were available during my care of the patient were reviewed by me and considered in my medical decision making (see chart for details).     77 y.o. female with right facial droop, slurred speech, right pronator drift and reported dizziness.  Patient last seen normal was greater than 24 hours ago.  She is outside of the  stroke window.  She denies any pain or infectious symptoms.  Will obtain labs and imaging and consult neurology.  Case discussed with my attending, Dr. Anitra Lauth.  11:32 AM Labs and imaging reviewed. This is reassuring. Patient failed her swallow study. Made NPO. Neurology consulted.   12:09 PM  Dr. Amada Jupiter recommends MRI. Asked to be called after the MRI results.   3:07 PM UA resulted. No signs of UTI. Marland Kitchen  4:10 PM With MRI pending, case signed out to Dr. Siri Cole. Anticipate admission for stroke vs observation.   Patient case seen and discussed with Dr. Anitra Lauth who is in agreement with plan.   Final Clinical Impressions(s) / ED Diagnoses   Final diagnoses:  Slurred speech    ED Discharge Orders    None       Princella Pellegrini 12/17/17 1611    Gwyneth Sprout, MD 12/17/17 2015

## 2017-12-17 NOTE — ED Notes (Signed)
Per her son, a friend spoke with her yesterday at 0930, she was NOT slurring her words at that time, but "she felt odd".

## 2017-12-17 NOTE — ED Notes (Signed)
Patient transported to CT 

## 2017-12-17 NOTE — ED Notes (Signed)
Pt returned from MRI, will continue to monitor

## 2017-12-17 NOTE — ED Notes (Signed)
Attempted report x 1; name and call back number provided 

## 2017-12-17 NOTE — ED Notes (Signed)
Chip, Brother, [phone]

## 2017-12-18 ENCOUNTER — Encounter (HOSPITAL_COMMUNITY): Payer: Self-pay

## 2017-12-18 ENCOUNTER — Inpatient Hospital Stay (HOSPITAL_COMMUNITY): Payer: Medicare Other

## 2017-12-18 DIAGNOSIS — I5030 Unspecified diastolic (congestive) heart failure: Secondary | ICD-10-CM

## 2017-12-18 LAB — URINE CULTURE: CULTURE: NO GROWTH

## 2017-12-18 LAB — LIPID PANEL
CHOL/HDL RATIO: 5 ratio
Cholesterol: 205 mg/dL — ABNORMAL HIGH (ref 0–200)
HDL: 41 mg/dL (ref >40)
LDL CALC: 139 mg/dL — AB (ref 0–99)
Triglycerides: 124 mg/dL (ref <150)
VLDL: 25 mg/dL (ref 0–40)

## 2017-12-18 LAB — HEMOGLOBIN A1C
Hgb A1c MFr Bld: 6.5 % — ABNORMAL HIGH (ref 4.8–5.6)
Mean Plasma Glucose: 139.85 mg/dL

## 2017-12-18 LAB — ECHOCARDIOGRAM COMPLETE

## 2017-12-18 MED ORDER — ATENOLOL 25 MG PO TABS
25.0000 mg | ORAL_TABLET | Freq: Every day | ORAL | Status: DC
  Administered 2017-12-18 – 2017-12-20 (×3): 25 mg via ORAL
  Filled 2017-12-18 (×3): qty 1

## 2017-12-18 MED ORDER — EZETIMIBE 10 MG PO TABS
10.0000 mg | ORAL_TABLET | Freq: Every day | ORAL | Status: DC
  Administered 2017-12-18 – 2017-12-20 (×3): 10 mg via ORAL
  Filled 2017-12-18 (×3): qty 1

## 2017-12-18 MED ORDER — RESOURCE THICKENUP CLEAR PO POWD
ORAL | Status: DC | PRN
  Filled 2017-12-18: qty 1500

## 2017-12-18 MED ORDER — CLOPIDOGREL BISULFATE 75 MG PO TABS
75.0000 mg | ORAL_TABLET | Freq: Every day | ORAL | Status: DC
  Administered 2017-12-18 – 2017-12-19 (×2): 75 mg via ORAL
  Filled 2017-12-18 (×3): qty 1

## 2017-12-18 NOTE — Progress Notes (Signed)
PROGRESS NOTE    Brandy Flowers  GNF:[id number] DOB: 11/03/1940 DOA: 12/17/2017 PCP: Philemon Kingdom, MD  Brief Narrative: Brandy Flowers is a 77 y.o. female with medical history significant of HTN, Anxiety, DM, Hypothyroidism, Renal artery stenosis, was brought to the ED by her brother due to worsening speech difficulty. -Work-up noted acute bilateral pontine ischemic strokes  Assessment & Plan:   Acute bilateral pontine strokes -?  Embolic etiology -Aspirin 325 mg daily -MRA brain-limited exam, 2D echocardiogram and carotid duplex pending -LDL is 139 and hemoglobin Q4O is 6.5 -Neurology consulted -PT/OT/SLP evaluations -may need TEE/Loop    Type 2 diabetes, controlled, with neuropathy (HCC) -not on medications -hemoglobin A1c is 6.5    Hypertension -Held spironolactone and amiloride -Resumed atenolol at a lower dose to prevent withdrawal tachycardia -Permissive hypertension for next 2 to 3 days, hydralazine PRN for BP greater than 220/120    Left renal artery stenosis (HCC) -H/o  50 to 60% left renal artery stenosis followed by Dr. Darrick Penna -On medical therapy with antihypertensives    Anxiety -Severe baseline anxiety on 1 mg Ativan 4 times daily as needed -Resumed   DVT prophylaxis: Lovenox Code Status: DNR Family Communication: Brother at bedside Disposition Plan: Home pending above work-up  Consultants:  -Neurology   Procedures:   Antimicrobials:    Subjective: -Having some difficulty urinating this morning   Objective: Vitals:   12/17/17 1800 12/18/17 0330 12/18/17 0620 12/18/17 0927  BP: (!) 165/58 (!) 161/100 (!) 143/54 (!) 173/62  Pulse: 62 69 60 67  Resp: 14  20 20   Temp:  98.2 F (36.8 C) 98.2 F (36.8 C)   TempSrc:  Oral Oral   SpO2: 97% 99% 98% 100%    Intake/Output Summary (Last 24 hours) at 12/18/2017 1113 Last data filed at 12/18/2017 1100 Gross per 24 hour  Intake 108.7 ml  Output -  Net 108.7 ml   There were no vitals  filed for this visit.  Examination:  General exam: Anxious appearing female, laying in bed, no distress Respiratory system: Decreased breath sounds at both bases, rest clear Cardiovascular system: S1 & S2 heard, RRR. Gastrointestinal system: Abdomen is nondistended, soft and nontender.Normal bowel sounds heard. Central nervous system: Awake and alert, dysarthric, motor 4+ by 5 in upper and lower extremities, light touch is intact, plantars are withdrawal Extremities: No edema  skin: No rashes, lesions or ulcers Psychiatry: Judgement and insight appear normal. Mood & affect appropriate.     Data Reviewed:   CBC: Recent Labs  Lab 12/17/17 0924  WBC 7.9  NEUTROABS 5.5  HGB 14.3  HCT 43.0  MCV 84.0  PLT 323   Basic Metabolic Panel: Recent Labs  Lab 12/17/17 0924  NA 138  K 3.4*  CL 101  CO2 24  GLUCOSE 150*  BUN 11  CREATININE 0.79  CALCIUM 9.7   GFR: CrCl cannot be calculated (Unknown ideal weight.). Liver Function Tests: Recent Labs  Lab 12/17/17 0924  AST 25  ALT 14  ALKPHOS 51  BILITOT 1.0  PROT 7.6  ALBUMIN 4.3   No results for input(s): LIPASE, AMYLASE in the last 168 hours. No results for input(s): AMMONIA in the last 168 hours. Coagulation Profile: Recent Labs  Lab 12/17/17 0924  INR 1.09   Cardiac Enzymes: No results for input(s): CKTOTAL, CKMB, CKMBINDEX, TROPONINI in the last 168 hours. BNP (last 3 results) No results for input(s): PROBNP in the last 8760 hours. HbA1C: Recent Labs    12/18/17 0353  HGBA1C  6.5*   CBG: Recent Labs  Lab 12/17/17 0941  GLUCAP 135*   Lipid Profile: Recent Labs    12/18/17 0353  CHOL 205*  HDL 41  LDLCALC 139*  TRIG 124  CHOLHDL 5.0   Thyroid Function Tests: No results for input(s): TSH, T4TOTAL, FREET4, T3FREE, THYROIDAB in the last 72 hours. Anemia Panel: No results for input(s): VITAMINB12, FOLATE, FERRITIN, TIBC, IRON, RETICCTPCT in the last 72 hours. Urine analysis:    Component Value  Date/Time   COLORURINE YELLOW 12/17/2017 1440   APPEARANCEUR CLEAR 12/17/2017 1440   LABSPEC 1.021 12/17/2017 1440   PHURINE 5.0 12/17/2017 1440   GLUCOSEU NEGATIVE 12/17/2017 1440   HGBUR NEGATIVE 12/17/2017 1440   BILIRUBINUR NEGATIVE 12/17/2017 1440   KETONESUR 80 (A) 12/17/2017 1440   PROTEINUR 30 (A) 12/17/2017 1440   NITRITE NEGATIVE 12/17/2017 1440   LEUKOCYTESUR NEGATIVE 12/17/2017 1440   Sepsis Labs: @LABRCNTIP (procalcitonin:4,lacticidven:4)  )No results found for this or any previous visit (from the past 240 hour(s)).       Radiology Studies: Ct Angio Head W Or Wo Contrast  Result Date: 12/17/2017 CLINICAL DATA:  Acute infarct pons EXAM: CT ANGIOGRAPHY HEAD AND NECK TECHNIQUE: Multidetector CT imaging of the head and neck was performed using the standard protocol during bolus administration of intravenous contrast. Multiplanar CT image reconstructions and MIPs were obtained to evaluate the vascular anatomy. Carotid stenosis measurements (when applicable) are obtained utilizing NASCET criteria, using the distal internal carotid diameter as the denominator. CONTRAST:  50mL ISOVUE-370 IOPAMIDOL (ISOVUE-370) INJECTION 76% COMPARISON:  MRI head 12/17/2017 FINDINGS: CTA NECK FINDINGS Aortic arch: Mild atherosclerotic calcification aortic arch. Proximal great vessels are tortuous with mild atherosclerotic disease but widely patent. Right carotid system: Right common carotid artery widely patent and tortuous. Mild atherosclerotic calcification right carotid bifurcation without stenosis Left carotid system: Tortuosity left common carotid artery which is widely patent. Left carotid bifurcation widely patent with minimal calcification. Vertebral arteries: Both vertebral arteries are patent to the basilar. Stenosis distal left vertebral artery. Skeleton: No acute skeletal abnormality. Other neck: Negative for mass or adenopathy Upper chest: Negative Review of the MIP images confirms the above  findings CTA HEAD FINDINGS Anterior circulation: Circumferential calcification right supraclinoid internal carotid artery with moderate stenosis. Mild stenosis left supraclinoid internal carotid artery. Both anterior middle cerebral arteries are patent without occlusion. Mild irregularity in the middle cerebral artery branches and anterior cerebral artery branches bilaterally due to atherosclerotic disease. Posterior circulation: Moderate stenosis distal left vertebral artery due to calcified plaque. Mild stenosis mid basilar which may account for the pontine infarct. Superior cerebellar and posterior cerebral arteries patent bilaterally. Mild atherosclerotic stenosis throughout the posterior cerebral artery bilaterally. Venous sinuses: Patent Anatomic variants: None Delayed phase: Normal enhancement on delayed imaging Review of the MIP images confirms the above findings IMPRESSION: 1. No significant carotid or vertebral artery stenosis in the neck 2. 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 Moderate stenosis distal right internal carotid artery and mild stenosis distal left internal carotid artery. Diffuse intracranial atherosclerotic disease. 4. Negative for emergent large vessel occlusion. 5. Mild stenosis mid basilar may account for the acute pontine infarct. Electronically Signed   By: Marlan Palau M.D.   On: 12/17/2017 19:15   Dg Chest 2 View  Result Date: 12/17/2017 CLINICAL DATA:  Slurred speech. EXAM: CHEST - 2 VIEW COMPARISON:  None. FINDINGS: Mild cardiomegaly. Normal pulmonary vascularity. Atherosclerotic calcification of the aortic arch. No focal consolidation, pleural effusion, or pneumothorax. No acute  osseous abnormality. IMPRESSION: 1.  No active cardiopulmonary disease.  Mild cardiomegaly. 2.  Aortic atherosclerosis (ICD10-I70.0). Electronically Signed   By: Obie Dredge M.D.   On: 12/17/2017 19:30   Ct Head Wo Contrast  Result Date: 12/17/2017 CLINICAL DATA:   Slurred speech since yesterday. EXAM: CT HEAD WITHOUT CONTRAST TECHNIQUE: Contiguous axial images were obtained from the base of the skull through the vertex without intravenous contrast. COMPARISON:  None. FINDINGS: Brain: No evidence of acute infarction, hemorrhage, hydrocephalus, extra-axial collection or mass lesion/mass effect. Moderate brain parenchymal volume loss. Minimal deep white matter microangiopathy. Vascular: Calcific atherosclerotic disease at the skull base. Skull: Normal. Negative for fracture or focal lesion. Sinuses/Orbits: Polypoid mucosal thickening with chronic periosteal reaction of the left maxillary sinus, suggestive of chronic sinusitis. Milder polypoid mucosal thickening of the ethmoid sinuses. Other: None. IMPRESSION: No acute intracranial abnormality. Chronic left maxillary sinusitis.  Mild bilateral ethmoid sinusitis. Electronically Signed   By: Ted Mcalpine M.D.   On: 12/17/2017 10:47   Ct Angio Neck W Or Wo Contrast  Result Date: 12/17/2017 CLINICAL DATA:  Acute infarct pons EXAM: CT ANGIOGRAPHY HEAD AND NECK TECHNIQUE: Multidetector CT imaging of the head and neck was performed using the standard protocol during bolus administration of intravenous contrast. Multiplanar CT image reconstructions and MIPs were obtained to evaluate the vascular anatomy. Carotid stenosis measurements (when applicable) are obtained utilizing NASCET criteria, using the distal internal carotid diameter as the denominator. CONTRAST:  50mL ISOVUE-370 IOPAMIDOL (ISOVUE-370) INJECTION 76% COMPARISON:  MRI head 12/17/2017 FINDINGS: CTA NECK FINDINGS Aortic arch: Mild atherosclerotic calcification aortic arch. Proximal great vessels are tortuous with mild atherosclerotic disease but widely patent. Right carotid system: Right common carotid artery widely patent and tortuous. Mild atherosclerotic calcification right carotid bifurcation without stenosis Left carotid system: Tortuosity left common carotid  artery which is widely patent. Left carotid bifurcation widely patent with minimal calcification. Vertebral arteries: Both vertebral arteries are patent to the basilar. Stenosis distal left vertebral artery. Skeleton: No acute skeletal abnormality. Other neck: Negative for mass or adenopathy Upper chest: Negative Review of the MIP images confirms the above findings CTA HEAD FINDINGS Anterior circulation: Circumferential calcification right supraclinoid internal carotid artery with moderate stenosis. Mild stenosis left supraclinoid internal carotid artery. Both anterior middle cerebral arteries are patent without occlusion. Mild irregularity in the middle cerebral artery branches and anterior cerebral artery branches bilaterally due to atherosclerotic disease. Posterior circulation: Moderate stenosis distal left vertebral artery due to calcified plaque. Mild stenosis mid basilar which may account for the pontine infarct. Superior cerebellar and posterior cerebral arteries patent bilaterally. Mild atherosclerotic stenosis throughout the posterior cerebral artery bilaterally. Venous sinuses: Patent Anatomic variants: None Delayed phase: Normal enhancement on delayed imaging Review of the MIP images confirms the above findings IMPRESSION: 1. No significant carotid or vertebral artery stenosis in the neck 2. 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 Moderate stenosis distal right internal carotid artery and mild stenosis distal left internal carotid artery. Diffuse intracranial atherosclerotic disease. 4. Negative for emergent large vessel occlusion. 5. Mild stenosis mid basilar may account for the acute pontine infarct. Electronically Signed   By: Marlan Palau M.D.   On: 12/17/2017 19:15   Mr Brain Wo Contrast (neuro Protocol)  Result Date: 12/17/2017 CLINICAL DATA:  Focal neuro deficit, greater than 6 hours stroke suspected. Slurred speech beginning yesterday. EXAM: MRI HEAD WITHOUT CONTRAST  TECHNIQUE: Multiplanar, multiecho pulse sequences of the brain and surrounding structures were obtained without intravenous contrast. COMPARISON:  CT head without contrast 12/17/2017 FINDINGS: Brain: Bilateral acute nonhemorrhagic infarcts are noted in the anterior pons. Right paramedian infarct slightly larger than the left. Associated T2 signal changes are evident. Mild white matter changes are otherwise within normal limits for age. Ventricles are of normal size. No significant extra-axial fluid collection is present. Vascular: Flow is present in the major intracranial arteries. Skull and upper cervical spine: The skull base is within normal limits. Craniocervical junction is normal. The upper cervical spine is within limits. Sinuses/Orbits: Mild mucosal thickening is present posteriorly in the left maxillary sinus. The remaining paranasal sinuses and the mastoid air cells are clear. Bilateral lens replacements are present. Globes and orbits are within normal limits. IMPRESSION: 1. 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 2. No evidence for other acute or remote infarcts. 3. Mild left maxillary sinus disease. Electronically Signed   By: Marin Roberts M.D.   On: 12/17/2017 16:25   Mr Maxine Glenn Head Wo Contrast  Result Date: 12/17/2017 CLINICAL DATA:  Acute pontine stroke EXAM: MRA HEAD WITHOUT CONTRAST TECHNIQUE: Angiographic images of the Circle of Willis were obtained using MRA technique without intravenous contrast. COMPARISON:  CT a head 12/17/2017, MRI head 12/17/2017 FINDINGS: Image quality degraded by significant motion. CTA head is of better quality. See those results. Negative for emergent large vessel occlusion. Mild stenosis mid pons. Atherosclerotic irregularity throughout the anterior cerebral arteries, posterior cerebral arteries, and middle cerebral arteries bilaterally. IMPRESSION: Study limited by motion.  See prior CTA results Electronically Signed   By: Marlan Palau M.D.   On: 12/17/2017 19:47        Scheduled Meds: . aspirin  300 mg Rectal Daily   Or  . aspirin  325 mg Oral Daily  . atenolol  25 mg Oral Daily  . enoxaparin (LOVENOX) injection  40 mg Subcutaneous Q24H  . mouth rinse  15 mL Mouth Rinse q12n4p  . Synthroid tablet (no blue dye)  100 mcg Oral Q24H   Continuous Infusions: . sodium chloride 10 mL/hr at 12/18/17 0815     LOS: 1 day    Time spent:    Zannie Cove, MD [facility] Page via [email or web address], password [email or web address] After 7PM please contact night-coverage  12/18/2017, 11:13 AM

## 2017-12-18 NOTE — Plan of Care (Signed)

## 2017-12-18 NOTE — Evaluation (Signed)
Physical Therapy Evaluation Patient Details Name: Brandy Flowers MRN: [id number] DOB: [birth year] Today's Date: 12/18/2017   History of Present Illness  This 77 y.o.  female admitted after sudden onset of dizziness, trouble speaking, and trouble walking.   MRI showed acute/subacute pontine nonhemorrhagic infarcts.  PMH:  HTNk DM     Clinical Impression  Pt admitted with above diagnosis. Pt currently with functional limitations due to the deficits listed below (see PT Problem List). PTA, pt independent with all moblity driving, living alone with stair to enter. Upon eval pt presents with strength, balance,  and cogntitive deficits. Appears to be in denial of new deficits and distressed at prospects of deficits. Discussed benefits of therapy. Pt standing and taking steps alone side of bed with premature sits. Mod A at this time. Feel strongly she would greatly benefit from intense CIR level therapies.  Pt will benefit from skilled PT to increase their independence and safety with mobility to allow discharge to the venue listed below.       Follow Up Recommendations CIR    Equipment Recommendations  (TBD)    Recommendations for Other Services Rehab consult     Precautions / Restrictions Precautions Precautions: Fall Restrictions Weight Bearing Restrictions: No      Mobility  Bed Mobility Overal bed mobility: Needs Assistance Bed Mobility: Sidelying to Sit;Sit to Supine   Sidelying to sit: Min assist   Sit to supine: Min assist   General bed mobility comments: Assist to lift trunk from bed and assist to lift Rt LE onto bed   Transfers Overall transfer level: Needs assistance Equipment used: 1 person hand held assist;Rolling walker (2 wheeled) Transfers: Sit to/from Stand Sit to Stand: Mod assist         General transfer comment: Mod A to power up cues for hand placement on RW. unable to rise without assistance and stabilization. less lean than noted in OT session    Ambulation/Gait             General Gait Details: defered due to dizziness and premature sitting.  Stairs            Wheelchair Mobility    Modified Rankin (Stroke Patients Only) Modified Rankin (Stroke Patients Only) Pre-Morbid Rankin Score: No symptoms Modified Rankin: Moderately severe disability     Balance Overall balance assessment: Needs assistance   Sitting balance-Leahy Scale: Poor       Standing balance-Leahy Scale: Poor                               Pertinent Vitals/Pain Pain Assessment: No/denies pain Pain Score: 0-No pain    Home Living Family/patient expects to be discharged to:: Private residence Living Arrangements: Alone Available Help at Discharge: Family;Personal care attendant;Available 24 hours/day;Friend(s)   Home Access: Stairs to enter Entrance Stairs-Rails: Right Entrance Stairs-Number of Steps: 2 Home Layout: One level Home Equipment: None Additional Comments: lives with her dog.  She is unable to tell me who will stay with her, but insists that she will have 24 hour physical assist, and that someone is taking care of that     Prior Function Level of Independence: Independent         Comments: Pt was fully independent PTA.       Hand Dominance   Dominant Hand: Right    Extremity/Trunk Assessment   Upper Extremity Assessment Upper Extremity Assessment: Defer to OT evaluation  Lower Extremity Assessment Lower Extremity Assessment: Generalized weakness(BLE motor 3-/5)       Communication   Communication: Expressive difficulties(dysarthria)  Cognition Arousal/Alertness: Awake/alert Behavior During Therapy: Anxious;Flat affect Overall Cognitive Status: No family/caregiver present to determine baseline cognitive functioning Area of Impairment: Attention;Awareness;Problem solving                   Current Attention Level: Sustained       Awareness: Intellectual Problem Solving:  Difficulty sequencing;Requires verbal cues;Requires tactile cues General Comments: pt begining to recongize more awarness of deficits and seems distraught durnig visit. wants to recover at home but may be poorly accepting she will need rehab first.       General Comments      Exercises     Assessment/Plan    PT Assessment Patient needs continued PT services  PT Problem List Decreased strength;Decreased range of motion;Decreased activity tolerance;Decreased balance;Decreased mobility;Decreased coordination;Decreased cognition;Decreased knowledge of use of DME;Decreased safety awareness;Decreased knowledge of precautions       PT Treatment Interventions      PT Goals (Current goals can be found in the Care Plan section)  Acute Rehab PT Goals Patient Stated Goal: to go home PT Goal Formulation: With patient Time For Goal Achievement: 01/01/18 Potential to Achieve Goals: Good    Frequency Min 4X/week   Barriers to discharge Decreased caregiver support lives alone with family nearby    Co-evaluation               AM-PAC PT "6 Clicks" Daily Activity  Outcome Measure Difficulty turning over in bed (including adjusting bedclothes, sheets and blankets)?: Unable Difficulty moving from lying on back to sitting on the side of the bed? : Unable Difficulty sitting down on and standing up from a chair with arms (e.g., wheelchair, bedside commode, etc,.)?: Unable Help needed moving to and from a bed to chair (including a wheelchair)?: A Lot Help needed walking in hospital room?: A Lot Help needed climbing 3-5 steps with a railing? : Total 6 Click Score: 8    End of Session   Activity Tolerance: Patient limited by fatigue Patient left: in bed;with call bell/phone within reach;with chair alarm set Nurse Communication: Mobility status PT Visit Diagnosis: Unsteadiness on feet (R26.81);Difficulty in walking, not elsewhere classified (R26.2);Other symptoms and signs involving the  nervous system (R29.898)    Time: 1610-9604 PT Time Calculation (min) (ACUTE ONLY): 25 min   Charges:   PT Evaluation $PT Eval Moderate Complexity: 1 Mod PT Treatments $Therapeutic Activity: 8-22 mins        Etta Grandchild, PT, DPT Acute Rehabilitation Services Pager: [phone] Office: [phone]   Etta Grandchild 12/18/2017, 6:46 PM

## 2017-12-18 NOTE — Evaluation (Signed)
Occupational Therapy Evaluation Patient Details Name: Brandy Flowers MRN: [id number] DOB: May 25, 1940 Today's Date: 12/18/2017    History of Present Illness This 77 y.o.  female admitted after sudden onset of dizziness, trouble speaking, and trouble walking.   MRI showed acute/subacute pontine nonhemorrhagic infarcts.  PMH:  HTNk DM    Clinical Impression   Pt admitted with above. She demonstrates the below listed deficits and will benefit from continued OT to maximize safety and independence with BADLs.  Pt presents to OT with impaired balance, generalized weakness, bil. UE weakness, Rt > Lt, impaired balance, mild visual deficits, impaired cognition including very poor awareness of her deficits and impact on function.  She is unable to generalize how current deficits will impact her, and insists she return home tonight.   After long discussion with her, she was agreeable to post acute rehab.  She does live alone, and reports she will have plenty of help at home upon discharge, but is unable to state who that would be.   Do feel she would benefit from CIR to allow her maximize safety and independence and reduce risk of falls.  Will follow acutely.       Follow Up Recommendations  CIR;Supervision/Assistance - 24 hour    Equipment Recommendations  (defer to next venue of care )    Recommendations for Other Services Rehab consult     Precautions / Restrictions Precautions Precautions: Fall      Mobility Bed Mobility Overal bed mobility: Needs Assistance Bed Mobility: Sidelying to Sit;Sit to Supine   Sidelying to sit: Min assist   Sit to supine: Min assist   General bed mobility comments: Assist to lift trunk from bed and assist to lift Rt LE onto bed   Transfers Overall transfer level: Needs assistance Equipment used: 1 person hand held assist Transfers: Sit to/from Stand Sit to Stand: Mod assist         General transfer comment: Pt required mod A to move into standing.   She demonstrates difficulty fully extending hips.  She sidestepped up EOB with mod, progressing to max A - heavy posterior lean.      Balance                                           ADL either performed or assessed with clinical judgement   ADL Overall ADL's : Needs assistance/impaired Eating/Feeding: NPO;Sitting   Grooming: Wash/dry hands;Wash/dry face;Oral care;Brushing hair;Minimal assistance;Sitting   Upper Body Bathing: Minimal assistance;Sitting   Lower Body Bathing: Maximal assistance;Sit to/from stand   Upper Body Dressing : Moderate assistance;Sitting   Lower Body Dressing: Total assistance;Sit to/from stand Lower Body Dressing Details (indicate cue type and reason): Pt states she can doff her socks, but when asked to do so, she was unable as she became dizzy when she attempted to lean forward  Toilet Transfer: Maximal assistance;+2 for safety/equipment;Squat-pivot;BSC   Toileting- Clothing Manipulation and Hygiene: Total assistance;Sit to/from stand       Functional mobility during ADLs: Maximal assistance       Vision Baseline Vision/History: No visual deficits Patient Visual Report: No change from baseline Vision Assessment?: Yes Eye Alignment: Within Functional Limits Ocular Range of Motion: Within Functional Limits Alignment/Gaze Preference: Within Defined Limits Tracking/Visual Pursuits: Other (comment) Convergence: Within functional limits Visual Fields: No apparent deficits Additional Comments: Pt frequently looses fixation during pursuits  Perception Perception Perception Tested?: Yes   Praxis Praxis Praxis tested?: Within functional limits    Pertinent Vitals/Pain Pain Assessment: No/denies pain     Hand Dominance Right   Extremity/Trunk Assessment Upper Extremity Assessment Upper Extremity Assessment: RUE deficits/detail;LUE deficits/detail 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 RUE Coordination: decreased  gross motor LUE Deficits / Details: grossly 4/5    Lower Extremity Assessment Lower Extremity Assessment: Defer to PT evaluation       Communication Communication Communication: Expressive difficulties(dysarthria )   Cognition Arousal/Alertness: Awake/alert Behavior During Therapy: Anxious;Flat affect Overall Cognitive Status: No family/caregiver present to determine baseline cognitive functioning Area of Impairment: Attention;Awareness;Problem solving                   Current Attention Level: Sustained       Awareness: Intellectual Problem Solving: Difficulty sequencing;Requires verbal cues;Requires tactile cues General Comments: Pt with very poor awareness of deficits.  Initially asks why she is so weak and when is it going to get better.  Even after explanation of stroke recovery, pt continues to ask these same questions.  Pt is inistent that she return home tonight or tomorrow, with poor ability to generalize her deficits and how they impact her functionally, and what would be required for her to return home.  Pt did eventually state she "just doesn't want to live like this.   General Comments  Long discussion with pt re: current deficits and functional levels, what would be required at this time for her to return home, and need for CIR - by end of session, pt somewhat receptive to [facility]     Exercises     Shoulder Instructions      Home Living Family/patient expects to be discharged to:: Private residence Living Arrangements: Alone Available Help at Discharge: Family;Personal care attendant;Available 24 hours/day;Friend(s)(per pt report `)   Home Access: Stairs to enter Entrance Stairs-Number of Steps: 2 Entrance Stairs-Rails: Right Home Layout: One level     Bathroom Shower/Tub: Chief Strategy Officer: Standard     Home Equipment: None   Additional Comments: lives with her dog.  She is unable to tell me who will stay with her, but insists that  she will have 24 hour physical assist, and that someone is taking care of that       Prior Functioning/Environment Level of Independence: Independent        Comments: Pt was fully independent PTA.          OT Problem List: Decreased strength;Decreased range of motion;Decreased activity tolerance;Impaired balance (sitting and/or standing);Impaired vision/perception;Decreased coordination;Decreased cognition;Decreased safety awareness;Decreased knowledge of use of DME or AE;Impaired UE functional use      OT Treatment/Interventions: Self-care/ADL training;Neuromuscular education;DME and/or AE instruction;Therapeutic activities;Cognitive remediation/compensation;Patient/family education;Balance training;Visual/perceptual remediation/compensation    OT Goals(Current goals can be found in the care plan section) Acute Rehab OT Goals Patient Stated Goal: to go home tomorrow  OT Goal Formulation: With patient Time For Goal Achievement: 01/01/18 Potential to Achieve Goals: Good ADL Goals Pt Will Perform Grooming: with min assist;standing Pt Will Perform Upper Body Bathing: with supervision;with set-up;sitting Pt Will Perform Lower Body Bathing: with min assist;sit to/from stand Pt Will Perform Upper Body Dressing: with supervision;with set-up;sitting Pt Will Perform Lower Body Dressing: with min assist;sit to/from stand Pt Will Transfer to Toilet: with min assist;stand pivot transfer;bedside commode Pt Will Perform Toileting - Clothing Manipulation and hygiene: with min assist;sit to/from stand  OT Frequency: Min 2X/week  Barriers to D/C: Decreased caregiver support  unsure of supports and unsure if she will have necessary level of assist at discharge        Co-evaluation              AM-PAC PT "6 Clicks" Daily Activity     Outcome Measure Help from another person eating meals?: Total Help from another person taking care of personal grooming?: A Little Help from another  person toileting, which includes using toliet, bedpan, or urinal?: A Lot Help from another person bathing (including washing, rinsing, drying)?: A Lot Help from another person to put on and taking off regular upper body clothing?: A Lot Help from another person to put on and taking off regular lower body clothing?: Total 6 Click Score: 11   End of Session Nurse Communication: Mobility status  Activity Tolerance: Patient limited by fatigue;Other (comment)(dizziness ) Patient left: in bed;with call bell/phone within reach;with bed alarm set  OT Visit Diagnosis: Unsteadiness on feet (R26.81);Dizziness and giddiness (R42)                Time: 4098-1191 OT Time Calculation (min): 29 min Charges:  OT General Charges $OT Visit: 1 Visit OT Evaluation $OT Eval Moderate Complexity: 1 Mod OT Treatments $Neuromuscular Re-education: 8-22 mins  Jeani Hawking, OTR/L Acute Rehabilitation Services Pager [phone] Office [phone]   Jeani Hawking M 12/18/2017, 1:57 PM

## 2017-12-18 NOTE — CV Procedure (Signed)
Echocardiogram not completed. Patient is off the floor with other testing.  Leta Jungling RDCS

## 2017-12-18 NOTE — Progress Notes (Signed)
STROKE TEAM PROGRESS NOTE   SUBJECTIVE (INTERVAL HISTORY) Her RN and brother are at the bedside.  Pt lying in bed, but awake alert and answer questions appropriately and following commands. Just passed MBS and on dysphagia 1 diet with pudding thick liquid.    OBJECTIVE Vitals:   12/17/17 1800 12/18/17 0330 12/18/17 0620 12/18/17 0927  BP: (!) 165/58 (!) 161/100 (!) 143/54 (!) 173/62  Pulse: 62 69 60 67  Resp: 14  20 20   Temp:  98.2 F (36.8 C) 98.2 F (36.8 C)   TempSrc:  Oral Oral   SpO2: 97% 99% 98% 100%    CBC:  Recent Labs  Lab 12/17/17 0924  WBC 7.9  NEUTROABS 5.5  HGB 14.3  HCT 43.0  MCV 84.0  PLT 323    Basic Metabolic Panel:  Recent Labs  Lab 12/17/17 0924  NA 138  K 3.4*  CL 101  CO2 24  GLUCOSE 150*  BUN 11  CREATININE 0.79  CALCIUM 9.7    Lipid Panel:     Component Value Date/Time   CHOL 205 (H) 12/18/2017 0353   TRIG 124 12/18/2017 0353   HDL 41 12/18/2017 0353   CHOLHDL 5.0 12/18/2017 0353   VLDL 25 12/18/2017 0353   LDLCALC 139 (H) 12/18/2017 0353   HgbA1c:  Lab Results  Component Value Date   HGBA1C 6.5 (H) 12/18/2017   Urine Drug Screen:     Component Value Date/Time   LABOPIA NONE DETECTED 12/17/2017 1440   COCAINSCRNUR NONE DETECTED 12/17/2017 1440   LABBENZ POSITIVE (A) 12/17/2017 1440   AMPHETMU NONE DETECTED 12/17/2017 1440   THCU NONE DETECTED 12/17/2017 1440   LABBARB NONE DETECTED 12/17/2017 1440    Alcohol Level     Component Value Date/Time   ETH <10 12/17/2017 1000    IMAGING  Ct Angio Head W Or Wo Contrast Ct Angio Neck W Or Wo Contrast 12/17/2017 IMPRESSION:  1. No significant carotid or vertebral artery stenosis in the neck  2. Moderate stenosis distal left vertebral artery V4 segment  3. Moderate stenosis distal right internal carotid artery and mild stenosis distal left internal carotid artery. Diffuse intracranial atherosclerotic disease.  4. Negative for emergent large vessel occlusion.  5. Mild  stenosis mid basilar may account for the acute pontine infarct.   Ct Head Wo Contrast 12/17/2017 IMPRESSION:  No acute intracranial abnormality. Chronic left maxillary sinusitis.  Mild bilateral ethmoid sinusitis.   Mr Brain Wo Contrast (neuro Protocol)  12/17/2017 IMPRESSION:  1. 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  2. No evidence for other acute or remote infarcts.  3. Mild left maxillary sinus disease.   Mr Maxine Glenn Head Wo Contrast 12/17/2017 IMPRESSION:  Study limited by motion.  See prior CTA results    Transthoracic Echocardiogram - pending 00/00/00   PHYSICAL EXAM  Temp:  [98.2 F (36.8 C)] 98.2 F (36.8 C) (11/03 0620) Pulse Rate:  [60-69] 67 (11/03 0927) Resp:  [14-20] 20 (11/03 0927) BP: (143-184)/(53-100) 173/62 (11/03 0927) SpO2:  [97 %-100 %] 100 % (11/03 0927)  General - Well nourished, well developed, in no apparent distress.  Ophthalmologic - fundi not visualized due to noncooperation.  Cardiovascular - Regular rate and rhythm.  Mental Status -  Level of arousal and orientation to time, place, and person were intact. Language including expression, naming, repetition, comprehension was assessed and found intact. Mild dysarthria Fund of Knowledge was assessed and was intact.  Cranial Nerves II - XII - II - Visual field  intact OU. III, IV, VI - Extraocular movements intact. V - Facial sensation intact bilaterally. VII - right facial droop. VIII - Hearing & vestibular intact bilaterally. X - Palate elevates symmetrically, mild dysarthria XI - Chin turning & shoulder shrug intact bilaterally. XII - Tongue protrusion intact.  Motor Strength - The patient's strength was symemtrical in all extremities and pronator drift was absent.  Bulk was normal and fasciculations were absent.   Motor Tone - Muscle tone was assessed at the neck and appendages and was normal.  Reflexes - The patient's reflexes were symmetrical in all  extremities and she had no pathological reflexes.  Sensory - Light touch, temperature/pinprick were assessed and were symmetrical.    Coordination - The patient had normal movements in the hands with no ataxia or dysmetria.  Tremor was absent.  Gait and Station - deferred.   ASSESSMENT/PLAN [name] is a 77 y.o. female with history of HTN, DM, anxiety, hypothyroidism and renal artery stenosis presenting with slurred speech. She did not receive IV t-PA due to late presentation.  Strokes: bilateral anterior pontine nonhemorrhagic infarcts - likely due to Brandy Flowers stenosis  Resultant  Right facial droop  CT head - No acute intracranial abnormality.  MRI head - Acute/subacute bilateral anterior pontine infarcts.   MRA head - Study limited by motion.   CTA H&N - Left V4 moderate stenosis. Right VA origin stenosis. Mid BA stenosis. Moderate stenosis right ICA siphon. Diffuse intracranial atherosclerosis.  2D Echo - pending  UDS - positive for benzodiazapines (on ativan at home)  LDL - 139  HgbA1c - 6.5  VTE prophylaxis - Lovenox  Diet - dys 1 diet with pudding liquid  aspirin 325 mg daily prior to admission, now on aspirin 325 mg daily and plavix 75mg . Recommend DAPT with ASA 325 and plavix 75 for 3 months and then plavix alone given intracranial stenosis.  Patient counseled to be compliant with her antithrombotic medications  Ongoing aggressive stroke risk factor management  Therapy recommendations:  pending  Disposition:  Pending  Hypertension  Stable . Permissive hypertension (OK if < 220/120) but gradually normalize in 5-7 days . Long-term BP goal normotensive  Hyperlipidemia  Lipid lowering medication PTA:  none  LDL 139, goal < 70  Pt stated that she has tried 4-5 statins in the past and not able to tolerate  Will start zetia 10mg  daily  Continue zetia at discharge  May consider to refer to lipid clinic or close PCP follow up on lipid  control  Diabetes  HgbA1c 6.5, goal < 7.0  Controlled  SSI  CBG monitoring  Other Stroke Risk Factors  Advanced age  Family hx stroke (mother)  Other Active Problems  Mild hypokalemia - 3.4  Hx of RAS  Anxiety - on ativan at home  Hospital day # 1  Neurology will sign off. Please call with questions. Pt will follow up with stroke clinic NP at [facility] in about 4 weeks. Thanks for the consult.  Marvel Plan, MD PhD Stroke Neurology 12/18/2017 4:03 PM    To contact Stroke Continuity provider, please refer to [email or web address]. After hours, contact General Neurology

## 2017-12-18 NOTE — Progress Notes (Signed)
Modified Barium Swallow Progress Note  Patient Details  Name: Brandy Flowers MRN: [id number] Date of Birth: 1940-05-02  Today's Date: 12/18/2017  Modified Barium Swallow completed.  Full report located under Chart Review in the Imaging Section.  Brief recommendations include the following:  Clinical Impression  Patient presents with a moderate oropharyngeal dysphagia. Orally, patient with mildly delayed A-P transit of bolus, intermttently decreased bolus cohesion, and mild oral residuals with premature spillage over the base of the tongue. Following, delayed swallow initiation combined with decreased laryngeal closure results in deep penetration with eventual aspiration of viscosities thinner than pudding thick. Attempted use of chin tuck which was inconsistently effective. Should be noted however that patient with some difficulty with carryout of strategy. SLP wonders if strategy would have been effective with liquid consistencies if patient had been able to consistently utilize. Mild vallecular residuals remain post swallow with solids due to base of tongue weakness increasing risk of post swallow aspiration,  although airway generally better protected. Recommend initiation of a conservative diet, puree (dys 1) with pudding thick liquids with close SLP f/u.    Swallow Evaluation Recommendations       SLP Diet Recommendations: Dysphagia 1 (Puree) solids;Pudding thick liquid   Liquid Administration via: Spoon   Medication Administration: Crushed with puree   Supervision: Patient able to self feed;Full supervision/cueing for compensatory strategies   Compensations: Small sips/bites;Slow rate   Postural Changes: Seated upright at 90 degrees   Oral Care Recommendations: Oral care BID   Other Recommendations: Order thickener from pharmacy;Prohibited food (jello, ice cream, thin soups);Remove water pitcher   Anthem Frazer MA, CCC-SLP   Brandy Flowers 12/18/2017,1:13 PM

## 2017-12-18 NOTE — Evaluation (Signed)
Clinical/Bedside Swallow Evaluation Patient Details  Name: Brandy Flowers MRN: [id number] Date of Birth: 1940/12/08  Today's Date: 12/18/2017 Time: SLP Start Time (ACUTE ONLY): 4098 SLP Stop Time (ACUTE ONLY): 0936 SLP Time Calculation (min) (ACUTE ONLY): 18 min  Past Medical History:  Past Medical History:  Diagnosis Date  . Anxiety   . Arthritis   . Diabetes mellitus without complication (HCC)   . Hypertension   . Sinus problem   . Thyroid disease    Past Surgical History:  Past Surgical History:  Procedure Laterality Date  . BREAST SURGERY    . EYE SURGERY     HPI:  77 y.o. female with medical history significant of HTN, Anxiety, DM, Hypothyroidism, Renal artery stenosis, was brought to the ED by her brother due to worsening speech difficulty. MRI was positive for acute bilateral pontine ischemic strokes.    Assessment / Plan / Recommendation Clinical Impression  Patient presents with evidence of an oropharyngeal dysphagia with decreased airway protection noted post intake of thin liquids. Given location of CVAs (bilateral pontine) with high risk of dysphagia and aspiration, instrumental evaluation highly recommended before initiating pos. Plan for MBS at 1400 today.  SLP Visit Diagnosis: Dysphagia, oropharyngeal phase (R13.12)       Diet Recommendation NPO   Medication Administration: Via alternative means    Other  Recommendations Oral Care Recommendations: Oral care QID   Follow up Recommendations Inpatient Rehab             Prognosis Prognosis for Safe Diet Advancement: Good      Swallow Study   General HPI: 77 y.o. female with medical history significant of HTN, Anxiety, DM, Hypothyroidism, Renal artery stenosis, was brought to the ED by her brother due to worsening speech difficulty. MRI was positive for acute bilateral pontine ischemic strokes.  Type of Study: Bedside Swallow Evaluation Previous Swallow Assessment: none Diet Prior to this Study:  NPO Temperature Spikes Noted: No Respiratory Status: Room air History of Recent Intubation: No Behavior/Cognition: Alert;Cooperative;Pleasant mood Oral Cavity Assessment: Within Functional Limits Oral Care Completed by SLP: No Oral Cavity - Dentition: Adequate natural dentition Vision: Functional for self-feeding Self-Feeding Abilities: Able to feed self Patient Positioning: Upright in bed Baseline Vocal Quality: Low vocal intensity Volitional Cough: Weak Volitional Swallow: Able to elicit    Oral/Motor/Sensory Function Overall Oral Motor/Sensory Function: Moderate impairment Facial ROM: Within Functional Limits Facial Symmetry: Within Functional Limits Facial Strength: Reduced right;Reduced left Facial Sensation: Within Functional Limits Lingual ROM: Within Functional Limits Lingual Symmetry: Within Functional Limits Lingual Strength: Reduced Lingual Sensation: Within Functional Limits Velum: Impaired right;Impaired left Mandible: Within Functional Limits   Ice Chips Ice chips: Impaired Presentation: Spoon Oral Phase Impairments: Impaired mastication;Reduced lingual movement/coordination Oral Phase Functional Implications: Prolonged oral transit   Thin Liquid Thin Liquid: Impaired Presentation: Cup;Self Fed Pharyngeal  Phase Impairments: Suspected delayed Swallow;Cough - Immediate    Nectar Thick Nectar Thick Liquid: Not tested   Honey Thick Honey Thick Liquid: Not tested   Puree Puree: Impaired Presentation: Spoon Pharyngeal Phase Impairments: Multiple swallows   Solid     Solid: Not tested     Brandy Siglin MA, CCC-SLP   Brandy Flowers Brandy Flowers 12/18/2017,9:41 AM

## 2017-12-18 NOTE — Progress Notes (Addendum)
Chaplain responded to patient request for prayer.  Patient appears exhausted, breathing is labored during speech.  Pt states she wants a DNR, but brother, who is a Media planner and a Designer, multimedia in the [employer] Brandy Flowers), is discouraging her from this, suggests that anything other than fighting in all ways for life is equivalent to suicide. Patient feels conflicted by this pressure, but understands that it may be due to brother's fear and concern for her.    Patient also states that her doctor said the DNR must be signed by her (the MD), but ER doctor said this is not true.  Chaplain recommends DNR education by doctor.  Patient is Catholic, but does not attend a particular parish.  She mentioned a very positive connection to the minister of [employer] in [place].  Chaplain offered to contact either local priest or that minister; patient requested latter.  Chaplain will make referral to minister for patient who is "a friend of Brandy Flowers"; the patient serves as Ad Litem for Brandy Flowers's grandchild.  Chaplain engaged in supportive conversation with patient, offered ongoing services as needed.   Please contact for further support as requested.  Brandy Flowers, [place] [phone]  Made contact with pastor who requested patient call him (cell:  [phone], Brandy Flowers). Patient became teary when I passed the information to her.  Please let Spiritual Care know of ongoing needs as this patient processes her medical reality while struggling with family pressures that are adding to her stress.        12/18/17 1100  Clinical Encounter Type  Visited With Patient  Visit Type Initial  Referral From Patient  Consult/Referral To Chaplain  Recommendations More conversation with MD re: DNR  Spiritual Encounters  Spiritual Needs Emotional  Stress Factors  Patient Stress Factors Lack of knowledge;Family relationships;Exhausted  Family Stress Factors Family relationships

## 2017-12-18 NOTE — Progress Notes (Signed)
  Echocardiogram 2D Echocardiogram has been performed.  Belva Chimes 12/18/2017, 3:20 PM

## 2017-12-19 DIAGNOSIS — I10 Essential (primary) hypertension: Secondary | ICD-10-CM

## 2017-12-19 DIAGNOSIS — E876 Hypokalemia: Secondary | ICD-10-CM

## 2017-12-19 DIAGNOSIS — R4781 Slurred speech: Secondary | ICD-10-CM

## 2017-12-19 DIAGNOSIS — E1140 Type 2 diabetes mellitus with diabetic neuropathy, unspecified: Secondary | ICD-10-CM

## 2017-12-19 DIAGNOSIS — I69391 Dysphagia following cerebral infarction: Secondary | ICD-10-CM

## 2017-12-19 DIAGNOSIS — I701 Atherosclerosis of renal artery: Secondary | ICD-10-CM

## 2017-12-19 DIAGNOSIS — I5189 Other ill-defined heart diseases: Secondary | ICD-10-CM

## 2017-12-19 DIAGNOSIS — E785 Hyperlipidemia, unspecified: Secondary | ICD-10-CM

## 2017-12-19 DIAGNOSIS — I6322 Cerebral infarction due to unspecified occlusion or stenosis of basilar arteries: Secondary | ICD-10-CM

## 2017-12-19 DIAGNOSIS — F419 Anxiety disorder, unspecified: Secondary | ICD-10-CM

## 2017-12-19 MED FILL — Levothyroxine Sodium Tab 100 MCG: ORAL | Qty: 1 | Status: AC

## 2017-12-19 NOTE — Progress Notes (Signed)
CSW received consult regarding PT recommendation of SNF at discharge since patient refused CIR.  Patient is refusing SNF. She stated that she prefers to return home at discharge. CSW inquired about home supports. She reported that her brother is an Charity fundraiser and will stay with her sometimes as well as a friend who can help out. She requests home health and a walker. She also inquired as to if she could sign herself out AMA. CSW stated that she could, but it is not recommended and can be costly. She responded, "I can lay at home on my couch just as well as here." CSW explained that the hospital is still giving her ivs and that the doctor will check back in tomorrow to see if she can discharge. RNCM aware.   CSW signing off for now. Please re-consult as needed.    Osborne Casco Mirely Pangle LCSW [phone]

## 2017-12-19 NOTE — Progress Notes (Signed)
Physical Therapy Treatment Patient Details Name: Brandy Flowers MRN: [id number] DOB: Jun 20, 1940 Today's Date: 12/19/2017    History of Present Illness This 77 y.o.  female admitted after sudden onset of dizziness, trouble speaking, and trouble walking.   MRI showed acute/subacute pontine nonhemorrhagic infarcts.  PMH:  HTNk DM     PT Comments    Brandy Flowers tearful throughout session today due to current functional status. PT educating patient on importance of OOB mobility as patient states many time "I can't" in regards to mobility. Patient requiring physical assist for transfers and gait with noted poor upright posturing and reduced use of UE at [facility] for added support/stability. Will continue to recommend CIR at discharge. PT to continue to follow.     Follow Up Recommendations  CIR     Equipment Recommendations  Other (comment)(defer)    Recommendations for Other Services Rehab consult     Precautions / Restrictions Precautions Precautions: Fall Restrictions Weight Bearing Restrictions: No    Mobility  Bed Mobility Overal bed mobility: Needs Assistance Bed Mobility: Supine to Sit     Supine to sit: Min assist     General bed mobility comments: Min A for LE and trunk management; use of bed pad to right hips  Transfers Overall transfer level: Needs assistance Equipment used: Rolling walker (2 wheeled) Transfers: Sit to/from Stand Sit to Stand: Mod assist;Min assist;+2 physical assistance         General transfer comment: Min-Mod A +2 to pwoer up at bedside; poor hip/knee extension upon standing; requires cueing for hand placement  Ambulation/Gait Ambulation/Gait assistance: Min assist;+2 physical assistance Gait Distance (Feet): 6 Feet Assistive device: Rolling walker (2 wheeled) Gait Pattern/deviations: Step-to pattern;Step-through pattern;Decreased stride length;Shuffle;Trunk flexed Gait velocity: decreased   General Gait Details: poor hip/knee extension;  requires cueing throughout for safety; quickly sitting as pateitn becomed tearful and frustrated   Stairs             Wheelchair Mobility    Modified Rankin (Stroke Patients Only) Modified Rankin (Stroke Patients Only) Pre-Morbid Rankin Score: No symptoms Modified Rankin: Moderately severe disability     Balance Overall balance assessment: Needs assistance Sitting-balance support: No upper extremity supported;Feet supported Sitting balance-Leahy Scale: Poor     Standing balance support: Bilateral upper extremity supported;During functional activity Standing balance-Leahy Scale: Poor                              Cognition Arousal/Alertness: Awake/alert Behavior During Therapy: Anxious;Flat affect Overall Cognitive Status: No family/caregiver present to determine baseline cognitive functioning                                 General Comments: brother present upon PT arrival, however quickly excusing himself      Exercises      General Comments General comments (skin integrity, edema, etc.): tearful throughout regarding current status; promotion of OOB mobility       Pertinent Vitals/Pain Pain Assessment: Faces Pain Score: 2  Faces Pain Scale: Hurts a little bit Pain Location: generalized Pain Descriptors / Indicators: Headache Pain Intervention(s): Limited activity within patient's tolerance;Monitored during session    Home Living     Available Help at Discharge: Family;Friend(s);Available 24 hours/day                Prior Function  PT Goals (current goals can now be found in the care plan section) Acute Rehab PT Goals Patient Stated Goal: to go home PT Goal Formulation: With patient Time For Goal Achievement: 01/01/18 Potential to Achieve Goals: Good Progress towards PT goals: Progressing toward goals    Frequency    Min 4X/week      PT Plan Current plan remains appropriate    Co-evaluation               AM-PAC PT "6 Clicks" Daily Activity  Outcome Measure  Difficulty turning over in bed (including adjusting bedclothes, sheets and blankets)?: A Little Difficulty moving from lying on back to sitting on the side of the bed? : Unable Difficulty sitting down on and standing up from a chair with arms (e.g., wheelchair, bedside commode, etc,.)?: Unable Help needed moving to and from a bed to chair (including a wheelchair)?: A Little Help needed walking in hospital room?: A Lot Help needed climbing 3-5 steps with a railing? : Total 6 Click Score: 11    End of Session Equipment Utilized During Treatment: Gait belt Activity Tolerance: Patient limited by fatigue Patient left: in chair;with call bell/phone within reach Nurse Communication: Mobility status PT Visit Diagnosis: Unsteadiness on feet (R26.81);Difficulty in walking, not elsewhere classified (R26.2);Other symptoms and signs involving the nervous system (R29.898)     Time: 1610-9604 PT Time Calculation (min) (ACUTE ONLY): 16 min  Charges:  $Therapeutic Activity: 8-22 mins                     Kipp Laurence, PT, DPT Supplemental Physical Therapist 12/19/17 2:31 PM Pager: [phone] Office: [phone]

## 2017-12-19 NOTE — Consult Note (Signed)
Physical Medicine and Rehabilitation Consult Reason for Consult: Gait disorder/dysarthria with decreased functional mobility Referring Physician: [facility]   HPI: Brandy Flowers is a 77 y.o. right-handed female history of hypertension, renal artery stenosis, diabetes mellitus.  Per chart review and patient, patient lives alone.  Independent prior to admission.  One level home 2 steps to entry.  She has a brother who is a Engineer, civil (consulting) and states she will be living with him post discharge.  Presented 12/17/2017 with slurred speech and altered mental status.  Cranial CT scan reviewed, unremarkable for acute cranial process.  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  Patient did not receive TPA.  CT angiogram of head and neck with no significant carotid or vertebral artery stenosis in the neck.  Negative for emergent large vessel occlusion.  Echocardiogram with ejection fraction of 70% grade 1 diastolic dysfunction.  No wall motion abnormalities.  Currently on aspirin and Plavix x3 months then Plavix alone.  Dysphagia #1 pudding thick liquid diet.  Therapy evaluations completed with recommendations of physical medicine rehab consult.   Review of Systems  Constitutional: Negative for chills and fever.  HENT: Negative for hearing loss.   Eyes: Negative for blurred vision and double vision.  Respiratory: Negative for cough and shortness of breath.   Cardiovascular: Negative for chest pain and palpitations.  Gastrointestinal: Positive for constipation. Negative for nausea and vomiting.  Genitourinary: Negative for dysuria, hematuria and urgency.  Musculoskeletal: Positive for joint pain and myalgias.  Skin: Negative for rash.  Neurological: Positive for dizziness, speech change and focal weakness. Negative for sensory change.  Psychiatric/Behavioral:       Anxiety  All other systems reviewed and are negative.  Past Medical History:  Diagnosis Date  . Anxiety   .  Arthritis   . Diabetes mellitus without complication (HCC)   . Hypertension   . Sinus problem   . Thyroid disease    Past Surgical History:  Procedure Laterality Date  . BREAST SURGERY    . EYE SURGERY     Family History  Problem Relation Age of Onset  . Stroke Mother   . Diabetes Mother   . Heart disease Mother        Before age 39  . Hypertension Mother   . Hyperlipidemia Mother   . Heart disease Father   . Heart attack Father   . Heart disease Brother        After age 95- CABG  . Hypertension Brother    Social History:  reports that she has never smoked. She has never used smokeless tobacco. She reports that she does not drink alcohol or use drugs. Allergies:  Allergies  Allergen Reactions  . Blue Dyes (Parenteral) Shortness Of Breath and Rash  . Sulfa Antibiotics Anaphylaxis  . Ace Inhibitors Nausea And Vomiting  . Norvasc [Amlodipine] Nausea And Vomiting  . Calcium Channel Blockers Diarrhea   Medications Prior to Admission  Medication Sig Dispense Refill  . acetaminophen-codeine (TYLENOL #3) 300-30 MG per tablet Take 0.5 tablets by mouth daily as needed (severe headache).     Marland Kitchen aspirin EC 325 MG tablet Take 325-650 mg by mouth 3 (three) times daily as needed (headache).    Marland Kitchen atenolol (TENORMIN) 50 MG tablet Take 100 mg by mouth daily.     Marland Kitchen levothyroxine (SYNTHROID, LEVOTHROID) 50 MCG tablet Take 100 mcg by mouth daily before breakfast.     . lidocaine (LIDODERM) 5 % Place 1 patch onto the  skin daily as needed (pain).     . LORazepam (ATIVAN) 1 MG tablet Take 1 mg by mouth See admin instructions. Take 1 tablet (1 mg) by mouth twice daily, may take a 3rd tablet (1 mg) during the day as needed for SBP >170, may also take a 4th tablet during the day as needed for anxiety.  5  . meloxicam (MOBIC) 15 MG tablet Take 15 mg by mouth daily as needed (leg pain).     . nitroGLYCERIN (NITROSTAT) 0.4 MG SL tablet Place 0.4 mg under the tongue daily as needed (SBP >180).   99     Home: Home Living Family/patient expects to be discharged to:: Private residence Living Arrangements: Alone Available Help at Discharge: Family, Personal care attendant, Available 24 hours/day, Friend(s) Home Access: Stairs to enter Secretary/administrator of Steps: 2 Entrance Stairs-Rails: Right Home Layout: One level Bathroom Shower/Tub: Engineer, manufacturing systems: Standard Home Equipment: None Additional Comments: lives with her dog.  She is unable to tell me who will stay with her, but insists that she will have 24 hour physical assist, and that someone is taking care of that   Functional History: Prior Function Level of Independence: Independent Comments: Pt was fully independent PTA.   Functional Status:  Mobility: Bed Mobility Overal bed mobility: Needs Assistance Bed Mobility: Sidelying to Sit, Sit to Supine Sidelying to sit: Min assist Sit to supine: Min assist General bed mobility comments: Assist to lift trunk from bed and assist to lift Rt LE onto bed  Transfers Overall transfer level: Needs assistance Equipment used: 1 person hand held assist, Rolling walker (2 wheeled) Transfers: Sit to/from Stand Sit to Stand: Mod assist General transfer comment: Mod A to power up cues for hand placement on RW. unable to rise without assistance and stabilization. less lean than noted in OT session  Ambulation/Gait General Gait Details: defered due to dizziness and premature sitting.    ADL: ADL Overall ADL's : Needs assistance/impaired Eating/Feeding: NPO, Sitting Grooming: Wash/dry hands, Wash/dry face, Oral care, Brushing hair, Minimal assistance, Sitting Upper Body Bathing: Minimal assistance, Sitting Lower Body Bathing: Maximal assistance, Sit to/from stand Upper Body Dressing : Moderate assistance, Sitting Lower Body Dressing: Total assistance, Sit to/from stand Lower Body Dressing Details (indicate cue type and reason): Pt states she can doff her socks, but  when asked to do so, she was unable as she became dizzy when she attempted to lean forward  Toilet Transfer: Maximal assistance, +2 for safety/equipment, Squat-pivot, BSC Toileting- Clothing Manipulation and Hygiene: Total assistance, Sit to/from stand Functional mobility during ADLs: Maximal assistance  Cognition: Cognition Overall Cognitive Status: No family/caregiver present to determine baseline cognitive functioning Orientation Level: Oriented X4 Cognition Arousal/Alertness: Awake/alert Behavior During Therapy: Anxious, Flat affect Overall Cognitive Status: No family/caregiver present to determine baseline cognitive functioning Area of Impairment: Attention, Awareness, Problem solving Current Attention Level: Sustained Awareness: Intellectual Problem Solving: Difficulty sequencing, Requires verbal cues, Requires tactile cues General Comments: pt begining to recongize more awarness of deficits and seems distraught durnig visit. wants to recover at home but may be poorly accepting she will need rehab first.   Blood pressure (!) 184/42, pulse 61, temperature (!) 97.5 F (36.4 C), temperature source Oral, resp. rate 20, SpO2 96 %. Physical Exam  Vitals reviewed. Constitutional: She is oriented to person, place, and time. She appears well-developed and well-nourished.  HENT:  Head: Normocephalic and atraumatic.  Eyes: EOM are normal. Right eye exhibits no discharge. Left eye exhibits no discharge.  Neck: Normal range of motion. Neck supple. No thyromegaly present.  Cardiovascular: Normal rate and regular rhythm.  Respiratory: Effort normal and breath sounds normal. No respiratory distress.  GI: Soft. Bowel sounds are normal. She exhibits no distension.  Musculoskeletal:  No edema or tenderness in extremities  Neurological: She is alert and oriented to person, place, and time.  Dysarthric speech but intelligible.   Follows simple commands.   Dysarthria Motor: Left upper extremity:  4/5 proximal distal Right upper extremity: 4--4/5 proximal distal Left lower extremity: Hip flexion, knee extension: 3+-4-/5, ankle dorsiflexion 4 medicine/5 Right lower extremity: Hip flexion, knee extension 3/5, ankle dorsiflexion 3+/5  Skin: Skin is warm and dry.  Psychiatric: Her affect is blunt. Her speech is slurred.    No results found for this or any previous visit (from the past 24 hour(s)). Ct Angio Head W Or Wo Contrast  Result Date: 12/17/2017 CLINICAL DATA:  Acute infarct pons EXAM: CT ANGIOGRAPHY HEAD AND NECK TECHNIQUE: Multidetector CT imaging of the head and neck was performed using the standard protocol during bolus administration of intravenous contrast. Multiplanar CT image reconstructions and MIPs were obtained to evaluate the vascular anatomy. Carotid stenosis measurements (when applicable) are obtained utilizing NASCET criteria, using the distal internal carotid diameter as the denominator. CONTRAST:  50mL ISOVUE-370 IOPAMIDOL (ISOVUE-370) INJECTION 76% COMPARISON:  MRI head 12/17/2017 FINDINGS: CTA NECK FINDINGS Aortic arch: Mild atherosclerotic calcification aortic arch. Proximal great vessels are tortuous with mild atherosclerotic disease but widely patent. Right carotid system: Right common carotid artery widely patent and tortuous. Mild atherosclerotic calcification right carotid bifurcation without stenosis Left carotid system: Tortuosity left common carotid artery which is widely patent. Left carotid bifurcation widely patent with minimal calcification. Vertebral arteries: Both vertebral arteries are patent to the basilar. Stenosis distal left vertebral artery. Skeleton: No acute skeletal abnormality. Other neck: Negative for mass or adenopathy Upper chest: Negative Review of the MIP images confirms the above findings CTA HEAD FINDINGS Anterior circulation: Circumferential calcification right supraclinoid internal carotid artery with moderate stenosis. Mild stenosis left  supraclinoid internal carotid artery. Both anterior middle cerebral arteries are patent without occlusion. Mild irregularity in the middle cerebral artery branches and anterior cerebral artery branches bilaterally due to atherosclerotic disease. Posterior circulation: Moderate stenosis distal left vertebral artery due to calcified plaque. Mild stenosis mid basilar which may account for the pontine infarct. Superior cerebellar and posterior cerebral arteries patent bilaterally. Mild atherosclerotic stenosis throughout the posterior cerebral artery bilaterally. Venous sinuses: Patent Anatomic variants: None Delayed phase: Normal enhancement on delayed imaging Review of the MIP images confirms the above findings IMPRESSION: 1. No significant carotid or vertebral artery stenosis in the neck 2. 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 Moderate stenosis distal right internal carotid artery and mild stenosis distal left internal carotid artery. Diffuse intracranial atherosclerotic disease. 4. Negative for emergent large vessel occlusion. 5. Mild stenosis mid basilar may account for the acute pontine infarct. Electronically Signed   By: Marlan Palau M.D.   On: 12/17/2017 19:15   Dg Chest 2 View  Result Date: 12/17/2017 CLINICAL DATA:  Slurred speech. EXAM: CHEST - 2 VIEW COMPARISON:  None. FINDINGS: Mild cardiomegaly. Normal pulmonary vascularity. Atherosclerotic calcification of the aortic arch. No focal consolidation, pleural effusion, or pneumothorax. No acute osseous abnormality. IMPRESSION: 1.  No active cardiopulmonary disease.  Mild cardiomegaly. 2.  Aortic atherosclerosis (ICD10-I70.0). Electronically Signed   By: Obie Dredge M.D.   On: 12/17/2017 19:30   Ct Head Wo  Contrast  Result Date: 12/17/2017 CLINICAL DATA:  Slurred speech since yesterday. EXAM: CT HEAD WITHOUT CONTRAST TECHNIQUE: Contiguous axial images were obtained from the base of the skull through the vertex without  intravenous contrast. COMPARISON:  None. FINDINGS: Brain: No evidence of acute infarction, hemorrhage, hydrocephalus, extra-axial collection or mass lesion/mass effect. Moderate brain parenchymal volume loss. Minimal deep white matter microangiopathy. Vascular: Calcific atherosclerotic disease at the skull base. Skull: Normal. Negative for fracture or focal lesion. Sinuses/Orbits: Polypoid mucosal thickening with chronic periosteal reaction of the left maxillary sinus, suggestive of chronic sinusitis. Milder polypoid mucosal thickening of the ethmoid sinuses. Other: None. IMPRESSION: No acute intracranial abnormality. Chronic left maxillary sinusitis.  Mild bilateral ethmoid sinusitis. Electronically Signed   By: Ted Mcalpine M.D.   On: 12/17/2017 10:47   Ct Angio Neck W Or Wo Contrast  Result Date: 12/17/2017 CLINICAL DATA:  Acute infarct pons EXAM: CT ANGIOGRAPHY HEAD AND NECK TECHNIQUE: Multidetector CT imaging of the head and neck was performed using the standard protocol during bolus administration of intravenous contrast. Multiplanar CT image reconstructions and MIPs were obtained to evaluate the vascular anatomy. Carotid stenosis measurements (when applicable) are obtained utilizing NASCET criteria, using the distal internal carotid diameter as the denominator. CONTRAST:  50mL ISOVUE-370 IOPAMIDOL (ISOVUE-370) INJECTION 76% COMPARISON:  MRI head 12/17/2017 FINDINGS: CTA NECK FINDINGS Aortic arch: Mild atherosclerotic calcification aortic arch. Proximal great vessels are tortuous with mild atherosclerotic disease but widely patent. Right carotid system: Right common carotid artery widely patent and tortuous. Mild atherosclerotic calcification right carotid bifurcation without stenosis Left carotid system: Tortuosity left common carotid artery which is widely patent. Left carotid bifurcation widely patent with minimal calcification. Vertebral arteries: Both vertebral arteries are patent to the  basilar. Stenosis distal left vertebral artery. Skeleton: No acute skeletal abnormality. Other neck: Negative for mass or adenopathy Upper chest: Negative Review of the MIP images confirms the above findings CTA HEAD FINDINGS Anterior circulation: Circumferential calcification right supraclinoid internal carotid artery with moderate stenosis. Mild stenosis left supraclinoid internal carotid artery. Both anterior middle cerebral arteries are patent without occlusion. Mild irregularity in the middle cerebral artery branches and anterior cerebral artery branches bilaterally due to atherosclerotic disease. Posterior circulation: Moderate stenosis distal left vertebral artery due to calcified plaque. Mild stenosis mid basilar which may account for the pontine infarct. Superior cerebellar and posterior cerebral arteries patent bilaterally. Mild atherosclerotic stenosis throughout the posterior cerebral artery bilaterally. Venous sinuses: Patent Anatomic variants: None Delayed phase: Normal enhancement on delayed imaging Review of the MIP images confirms the above findings IMPRESSION: 1. No significant carotid or vertebral artery stenosis in the neck 2. 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 Moderate stenosis distal right internal carotid artery and mild stenosis distal left internal carotid artery. Diffuse intracranial atherosclerotic disease. 4. Negative for emergent large vessel occlusion. 5. Mild stenosis mid basilar may account for the acute pontine infarct. Electronically Signed   By: Marlan Palau M.D.   On: 12/17/2017 19:15   Mr Brain Wo Contrast (neuro Protocol)  Result Date: 12/17/2017 CLINICAL DATA:  Focal neuro deficit, greater than 6 hours stroke suspected. Slurred speech beginning yesterday. EXAM: MRI HEAD WITHOUT CONTRAST TECHNIQUE: Multiplanar, multiecho pulse sequences of the brain and surrounding structures were obtained without intravenous contrast. COMPARISON:  CT head without  contrast 12/17/2017 FINDINGS: Brain: Bilateral acute nonhemorrhagic infarcts are noted in the anterior pons. Right paramedian infarct slightly larger than the left. Associated T2 signal changes are evident. Mild white matter changes  are otherwise within normal limits for age. Ventricles are of normal size. No significant extra-axial fluid collection is present. Vascular: Flow is present in the major intracranial arteries. Skull and upper cervical spine: The skull base is within normal limits. Craniocervical junction is normal. The upper cervical spine is within limits. Sinuses/Orbits: Mild mucosal thickening is present posteriorly in the left maxillary sinus. The remaining paranasal sinuses and the mastoid air cells are clear. Bilateral lens replacements are present. Globes and orbits are within normal limits. IMPRESSION: 1. 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 2. No evidence for other acute or remote infarcts. 3. Mild left maxillary sinus disease. Electronically Signed   By: Marin Roberts M.D.   On: 12/17/2017 16:25   Dg Swallowing Func-speech Pathology  Result Date: 12/18/2017 Objective Swallowing Evaluation: Type of Study: MBS-Modified Barium Swallow Study  Patient Details Name: Suri Tafolla MRN: [id number] Date of Birth: January 04, 1941 Today's Date: 12/18/2017 Time: SLP Start Time (ACUTE ONLY): 1230 -SLP Stop Time (ACUTE ONLY): 1252 SLP Time Calculation (min) (ACUTE ONLY): 22 min Past Medical History: Past Medical History: Diagnosis Date . Anxiety  . Arthritis  . Diabetes mellitus without complication (HCC)  . Hypertension  . Sinus problem  . Thyroid disease  Past Surgical History: Past Surgical History: Procedure Laterality Date . BREAST SURGERY   . EYE SURGERY   HPI: 77 y.o. female with medical history significant of HTN, Anxiety, DM, Hypothyroidism, Renal artery stenosis, was brought to the ED by her brother due to worsening speech difficulty. MRI was positive for acute  bilateral pontine ischemic strokes.  No data recorded Assessment / Plan / Recommendation CH[facility] IP CLINICAL IMPRESSIONS 12/18/2017 Clinical Impression Patient presents with a moderate oropharyngeal dysphagia. Orally, patient with mildly delayed A-P transit of bolus, intermttently decreased bolus cohesion, and mild oral residuals with premature spillage over the base of the tongue. Following, delayed swallow initiation combined with decreased laryngeal closure results in deep penetration with eventual aspiration of viscosities thinner than pudding thick. Attempted use of chin tuck which was inconsistently effective. Should be noted however that patient with some difficulty with carryout of strategy. SLP wonders if strategy would have been effective with liquid consistencies if patient had been able to consistently utilize. Mild vallecular residuals remain post swallow with solids due to base of tongue weakness increasing risk of post swallow aspiration,  although airway generally better protected. Recommend initiation of a conservative diet, puree (dys 1) with pudding thick liquids with close SLP f/u.  SLP Visit Diagnosis Dysphagia, oropharyngeal phase (R13.12) Attention and concentration deficit following -- Frontal lobe and executive function deficit following -- Impact on safety and function Severe aspiration risk   [facility] IP TREATMENT RECOMMENDATION 12/18/2017 Treatment Recommendations Therapy as outlined in treatment plan below   Prognosis 12/18/2017 Prognosis for Safe Diet Advancement Good Barriers to Reach Goals -- Barriers/Prognosis Comment -- [facility] IP DIET RECOMMENDATION 12/18/2017 SLP Diet Recommendations Dysphagia 1 (Puree) solids;Pudding thick liquid Liquid Administration via Spoon Medication Administration Crushed with puree Compensations Small sips/bites;Slow rate Postural Changes Seated upright at 90 degrees   CHL IP OTHER RECOMMENDATIONS 12/18/2017 Recommended Consults -- Oral Care Recommendations Oral care BID  Other Recommendations Order thickener from pharmacy;Prohibited food (jello, ice cream, thin soups);Remove water pitcher   [facility] IP FOLLOW UP RECOMMENDATIONS 12/18/2017 Follow up Recommendations Inpatient Rehab   [facility] IP FREQUENCY AND DURATION 12/18/2017 Speech Therapy Frequency (ACUTE ONLY) min 3x week Treatment Duration 2 weeks      CHL IP ORAL PHASE 12/18/2017 Oral Phase Impaired Oral -  Pudding Teaspoon -- Oral - Pudding Cup -- Oral - Honey Teaspoon Lingual/palatal residue;Delayed oral transit Oral - Honey Cup Lingual/palatal residue;Delayed oral transit Oral - Nectar Teaspoon Lingual/palatal residue;Delayed oral transit Oral - Nectar Cup Lingual/palatal residue;Delayed oral transit Oral - Nectar Straw -- Oral - Thin Teaspoon Lingual/palatal residue;Delayed oral transit Oral - Thin Cup Lingual/palatal residue;Delayed oral transit Oral - Thin Straw -- Oral - Puree Lingual/palatal residue;Delayed oral transit Oral - Mech Soft Lingual/palatal residue;Delayed oral transit Oral - Regular -- Oral - Multi-Consistency -- Oral - Pill -- Oral Phase - Comment --  CH[facility] IP PHARYNGEAL PHASE 12/18/2017 Pharyngeal Phase Impaired Pharyngeal- Pudding Teaspoon -- Pharyngeal -- Pharyngeal- Pudding Cup -- Pharyngeal -- Pharyngeal- Honey Teaspoon Delayed swallow initiation-vallecula;Penetration/Aspiration during swallow;Reduced airway/laryngeal closure Pharyngeal Material enters airway, CONTACTS cords and not ejected out;Material enters airway, passes BELOW cords without attempt by patient to eject out (silent aspiration) Pharyngeal- Honey Cup Delayed swallow initiation-pyriform sinuses;Reduced airway/laryngeal closure;Penetration/Aspiration during swallow Pharyngeal Material enters airway, passes BELOW cords without attempt by patient to eject out (silent aspiration);Material enters airway, CONTACTS cords and not ejected out Pharyngeal- Nectar Teaspoon Delayed swallow initiation-vallecula;Reduced airway/laryngeal  closure;Penetration/Aspiration during swallow Pharyngeal Material enters airway, CONTACTS cords and then ejected out;Material enters airway, passes BELOW cords without attempt by patient to eject out (silent aspiration) Pharyngeal- Nectar Cup Delayed swallow initiation-pyriform sinuses;Reduced airway/laryngeal closure;Penetration/Aspiration during swallow Pharyngeal Material enters airway, CONTACTS cords and not ejected out;Material enters airway, passes BELOW cords without attempt by patient to eject out (silent aspiration) Pharyngeal- Nectar Straw -- Pharyngeal -- Pharyngeal- Thin Teaspoon Delayed swallow initiation-pyriform sinuses;Reduced airway/laryngeal closure;Penetration/Aspiration during swallow Pharyngeal Material enters airway, CONTACTS cords and not ejected out Pharyngeal- Thin Cup Delayed swallow initiation-pyriform sinuses;Reduced airway/laryngeal closure;Penetration/Aspiration during swallow Pharyngeal Material enters airway, passes BELOW cords and not ejected out despite cough attempt by patient Pharyngeal- Thin Straw -- Pharyngeal -- Pharyngeal- Puree Delayed swallow initiation-vallecula;Reduced tongue base retraction;Pharyngeal residue - valleculae Pharyngeal -- Pharyngeal- Mechanical Soft Delayed swallow initiation-vallecula;Reduced tongue base retraction;Pharyngeal residue - valleculae Pharyngeal -- Pharyngeal- Regular -- Pharyngeal -- Pharyngeal- Multi-consistency -- Pharyngeal -- Pharyngeal- Pill -- Pharyngeal -- Pharyngeal Comment --  CHL IP CERVICAL ESOPHAGEAL PHASE 12/18/2017 Cervical Esophageal Phase WFL Pudding Teaspoon -- Pudding Cup -- Honey Teaspoon -- Honey Cup -- Nectar Teaspoon -- Nectar Cup -- Nectar Straw -- Thin Teaspoon -- Thin Cup -- Thin Straw -- Puree -- Mechanical Soft -- Regular -- Multi-consistency -- Pill -- Cervical Esophageal Comment -- Ferdinand Lango MA, CCC-SLP McCoy Leah Meryl 12/18/2017, 1:13 PM              Mr Maxine Glenn Head Wo Contrast  Result Date: 12/17/2017 CLINICAL  DATA:  Acute pontine stroke EXAM: MRA HEAD WITHOUT CONTRAST TECHNIQUE: Angiographic images of the Circle of Willis were obtained using MRA technique without intravenous contrast. COMPARISON:  CT a head 12/17/2017, MRI head 12/17/2017 FINDINGS: Image quality degraded by significant motion. CTA head is of better quality. See those results. Negative for emergent large vessel occlusion. Mild stenosis mid pons. Atherosclerotic irregularity throughout the anterior cerebral arteries, posterior cerebral arteries, and middle cerebral arteries bilaterally. IMPRESSION: Study limited by motion.  See prior CTA results Electronically Signed   By: Marlan Palau M.D.   On: 12/17/2017 19:47    Assessment/Plan: Diagnosis: Bilateral anterior pontine nonhemorrhagic infarction Labs and images (see above) independently reviewed.  Records reviewed and summated above. Stroke: Continue secondary stroke prophylaxis and Risk Factor Modification listed below:   Antiplatelet therapy:   Blood Pressure Management:  Continue current medication  with prn's with permisive HTN per primary team Statin Agent:   Diabetes management:   Bilateral hemiparesis Motor recovery: Fluoxetine  1. Does the need for close, 24 hr/day medical supervision in concert with the patient's rehab needs make it unreasonable for this patient to be served in a less intensive setting? Yes  2. Co-Morbidities requiring supervision/potential complications: HTN (monitor and provide prns in accordance with increased physical exertion and pain), renal artery stenosis, DM (Monitor in accordance with exercise and adjust meds as necessary), diastolic dysfunction (monitor for signs and symptoms of fluid overload), post-stroke dysphagia (advance diet as tolerated), hypokalemia (continue to monitor and replete as necessary) 3. Due to safety, disease management and patient education, does the patient require 24 hr/day rehab nursing? Yes 4. Does the patient require  coordinated care of a physician, rehab nurse, PT (1-2 hrs/day, 5 days/week) and OT (1-2 hrs/day, 5 days/week) to address physical and functional deficits in the context of the above medical diagnosis(es)? Yes Addressing deficits in the following areas: balance, endurance, locomotion, strength, transferring, bathing, dressing, toileting and psychosocial support 5. Can the patient actively participate in an intensive therapy program of at least 3 hrs of therapy per day at least 5 days per week? Yes 6. The potential for patient to make measurable gains while on inpatient rehab is excellent 7. Anticipated functional outcomes upon discharge from inpatient rehab are supervision and min assist  with PT, supervision and min assist with OT, n/a with SLP. 8. Estimated rehab length of stay to reach the above functional goals is: 15-19 days. 9. Anticipated D/C setting: Other 10. Anticipated post D/C treatments: HH therapy and Home excercise program 11. Overall Rehab/Functional Prognosis: good  RECOMMENDATIONS: This patient's condition is appropriate for continued rehabilitative care in the following setting: Patient refusing CIR at this time requesting to go home with home health.  Given current deficits and lack of support at discharge do not believe this is a safe disposition.  Recommend SNF at present. Patient has agreed to participate in recommended program. Potentially Note that insurance prior authorization may be required for reimbursement for recommended care.  Comment: Rehab Admissions Coordinator to follow up.   I have personally performed a face to face diagnostic evaluation, including, but not limited to relevant history and physical exam findings, of this patient and developed relevant assessment and plan.  Additionally, I have reviewed and concur with the physician assistant's documentation above.   Maryla Morrow, MD, ABPMR [facility] Rossetti Angiulli, PA-C 12/19/2017

## 2017-12-19 NOTE — Evaluation (Signed)
Clinical/Bedside Swallow Evaluation Patient Details  Name: Florrie Ramires MRN: [id number] Date of Birth: Oct 16, 1940  Today's Date: 12/19/2017 Time: SLP Start Time (ACUTE ONLY): 1010 SLP Stop Time (ACUTE ONLY): 1019 SLP Time Calculation (min) (ACUTE ONLY): 9 min  Past Medical History:  Past Medical History:  Diagnosis Date  . Anxiety   . Arthritis   . Diabetes mellitus without complication (HCC)   . Hypertension   . Sinus problem   . Thyroid disease    Past Surgical History:  Past Surgical History:  Procedure Laterality Date  . BREAST SURGERY    . EYE SURGERY     HPI:  77 y.o. female with medical history significant of HTN, Anxiety, DM, Hypothyroidism, Renal artery stenosis, was brought to the ED by her brother due to worsening speech difficulty. MRI was positive for acute bilateral pontine ischemic strokes.    Assessment / Plan / Recommendation Clinical Impression  Pt has a mild dysarthria, characterized by imprecise articulation and slowed rate of speech. Intelligibility is mildly reduced at the conversational level. Cognitively she has reduced intellectual and safety awareness, although this appears to be improving per chart review. Although with mild cueing she can discuss her acute impairments, she still does not exhibit anticipatory awareness as it relates to her needs post-acute hospital stay. Of note, pt also reported that she tries to maintain a vegan diet at home, but was observed to be trying only the animal-based products on her tray (pureed sausage, magic cup). SLP redirected to some more plant-based options (although unsure if truly vegan). Mod cues were needed for simple, functional problem solving. Delayed recall task was completed with good accuracy and she remembered general information from [facility] on previous date. Pt would benefit from inpatient rehab stay to maximize functional independence and increase intelligibility of speech. SLP Visit Diagnosis: Cognitive  communication deficit (R41.841);Dysarthria and anarthria (R47.1)    Aspiration Risk       Diet Recommendation     Medication Administration: Crushed with puree Compensations: Slow rate;Small sips/bites    Other  Recommendations Oral Care Recommendations: Oral care BID   Follow up Recommendations Inpatient Rehab      Frequency and Duration min 2x/week          Prognosis        Swallow Study   General HPI: 77 y.o. female with medical history significant of HTN, Anxiety, DM, Hypothyroidism, Renal artery stenosis, was brought to the ED by her brother due to worsening speech difficulty. MRI was positive for acute bilateral pontine ischemic strokes.  Temperature Spikes Noted: No Oral Cavity - Dentition: Adequate natural dentition    Oral/Motor/Sensory Function     Ice Chips     Thin Liquid      Nectar Thick     Honey Thick     Puree     Solid            Maxcine Ham 12/19/2017,10:46 AM   Maxcine Ham, M.A. CCC-SLP Acute Herbalist [phone] Office [phone]

## 2017-12-19 NOTE — Progress Notes (Signed)
  Speech Language Pathology Treatment: Dysphagia  Patient Details Name: Brandy Flowers MRN: [id number] DOB: Jul 08, 1940 Today's Date: 12/19/2017 Time: 1001-1010 SLP Time Calculation (min) (ACUTE ONLY): 9 min  Assessment / Plan / Recommendation Clinical Impression  Pt consumed pureed solids and pudding thick liquids without overt signs of aspiration. She uses a slow rate and takes small bites at a time with Mod I. SLP provided Mod-Max cues to attempt use of chin tuck per recommendation from MBS to facilitate diet advancement; however, pt was not able to coordinate timing for appropriate use. She would tuck her chin on command, but would consistently bring her head back to a neutral position prior to swallowing. Will continue to follow for readiness to advance POs, but would continue current diet and precautions for now. Pt would benefit from CIR given severity of dysphagia to maximize safety with intake.   HPI HPI: 77 y.o. female with medical history significant of HTN, Anxiety, DM, Hypothyroidism, Renal artery stenosis, was brought to the ED by her brother due to worsening speech difficulty. MRI was positive for acute bilateral pontine ischemic strokes.       SLP Plan  Continue with current plan of care       Recommendations  Diet recommendations: Dysphagia 1 (puree);Pudding-thick liquid Liquids provided via: Teaspoon Medication Administration: Crushed with puree Supervision: Patient able to self feed;Full supervision/cueing for compensatory strategies Compensations: Slow rate;Small sips/bites Postural Changes and/or Swallow Maneuvers: Seated upright 90 degrees                Oral Care Recommendations: Oral care BID Follow up Recommendations: Inpatient Rehab SLP Visit Diagnosis: Dysphagia, oropharyngeal phase (R13.12) Plan: Continue with current plan of care       GO                Maxcine Ham 12/19/2017, 10:35 AM  Maxcine Ham, M.A. CCC-SLP Acute  Herbalist [phone] Office [phone]

## 2017-12-19 NOTE — Plan of Care (Signed)
Pt resting in bed comfortably in no apparent signs of distress. Pt eager to go to inpatient rehab to regain strength

## 2017-12-19 NOTE — Progress Notes (Signed)
PROGRESS NOTE    Shyane Fossum  ZOX:[id number] DOB: 01-09-1941 DOA: 12/17/2017 PCP: Philemon Kingdom, MD  Brief Narrative: Mayo Owczarzak is a 77 y.o. female with medical history significant of HTN, Anxiety, DM, Hypothyroidism, Renal artery stenosis, was brought to the ED by her brother due to worsening speech difficulty. -Work-up noted acute bilateral pontine ischemic strokes  Assessment & Plan:   Acute bilateral pontine strokes -?  Embolic etiology -MRA -limited by motion - 2D echocardiogram showed preserved EF, no thrombus -CTA neck- Left V4 moderate stenosis. Right VA origin stenosis. Mid BA stenosis. Moderate stenosis right ICA siphon. Diffuse intracranial atherosclerosis.  -LDL is 139 and hemoglobin W1X is 6.5 -Neurology consulted recommends DAPT with ASA 325 and plavix 75 for 3 months and then plavix alone -PT/OT/SLP evaluations completed, CIR recommended, pt apparently refused this    Type 2 diabetes, controlled, with neuropathy (HCC) -not on medications -hemoglobin A1c is 6.5    Hypertension -Held spironolactone and amiloride -Resumed atenolol at a lower dose to prevent withdrawal tachycardia -Permissive hypertension for next 2 to 3 days, hydralazine PRN for BP greater than 220/120    Left renal artery stenosis (HCC) -H/o  50 to 60% left renal artery stenosis followed by Dr. Darrick Penna -On medical therapy with antihypertensives    Anxiety -Severe baseline anxiety on 1 mg Ativan 4 times daily as needed -Resumed   DVT prophylaxis: Lovenox Code Status: DNR Family Communication: none at bedside Disposition Plan: SNF tomorrow if stable  Consultants:  -Neurology   Procedures:   Antimicrobials:    Subjective: -no events, c/o weakness, speech improving   Objective: Vitals:   12/18/17 0927 12/18/17 1627 12/18/17 2142 12/19/17 1255  BP: (!) 173/62 (!) 178/117 (!) 184/42 132/71  Pulse: 67 63 61 68  Resp: 20 20 20 18   Temp:  98.2 F (36.8 C) (!) 97.5 F  (36.4 C) 97.9 F (36.6 C)  TempSrc:   Oral   SpO2: 100% 95% 96% 98%    Intake/Output Summary (Last 24 hours) at 12/19/2017 1401 Last data filed at 12/19/2017 0900 Gross per 24 hour  Intake 650.19 ml  Output 300 ml  Net 350.19 ml   There were no vitals filed for this visit.  Examination:  Gen: Awake, Alert, Oriented X 3, anxious, speech improving HEENT: PERRLA, Neck supple, no JVD Lungs: decreased BS at bases CVS: RRR,No Gallops,Rubs or new Murmurs Abd: soft, Non tender, non distended, BS present Extremities: No Cyanosis, Clubbing or edema Skin: no new rashes CNS: Awake and alert, dysarthric, motor 4+ by 5 in upper and lower extremities, light touch is intact, plantars are withdrawal, speech much better today Psychiatry: Judgement and insight appear normal. Mood & affect appropriate.     Data Reviewed:   CBC: Recent Labs  Lab 12/17/17 0924  WBC 7.9  NEUTROABS 5.5  HGB 14.3  HCT 43.0  MCV 84.0  PLT 323   Basic Metabolic Panel: Recent Labs  Lab 12/17/17 0924  NA 138  K 3.4*  CL 101  CO2 24  GLUCOSE 150*  BUN 11  CREATININE 0.79  CALCIUM 9.7   GFR: CrCl cannot be calculated (Unknown ideal weight.). Liver Function Tests: Recent Labs  Lab 12/17/17 0924  AST 25  ALT 14  ALKPHOS 51  BILITOT 1.0  PROT 7.6  ALBUMIN 4.3   No results for input(s): LIPASE, AMYLASE in the last 168 hours. No results for input(s): AMMONIA in the last 168 hours. Coagulation Profile: Recent Labs  Lab 12/17/17 0924  INR  1.09   Cardiac Enzymes: No results for input(s): CKTOTAL, CKMB, CKMBINDEX, TROPONINI in the last 168 hours. BNP (last 3 results) No results for input(s): PROBNP in the last 8760 hours. HbA1C: Recent Labs    12/18/17 0353  HGBA1C 6.5*   CBG: Recent Labs  Lab 12/17/17 0941  GLUCAP 135*   Lipid Profile: Recent Labs    12/18/17 0353  CHOL 205*  HDL 41  LDLCALC 139*  TRIG 124  CHOLHDL 5.0   Thyroid Function Tests: No results for input(s):  TSH, T4TOTAL, FREET4, T3FREE, THYROIDAB in the last 72 hours. Anemia Panel: No results for input(s): VITAMINB12, FOLATE, FERRITIN, TIBC, IRON, RETICCTPCT in the last 72 hours. Urine analysis:    Component Value Date/Time   COLORURINE YELLOW 12/17/2017 1440   APPEARANCEUR CLEAR 12/17/2017 1440   LABSPEC 1.021 12/17/2017 1440   PHURINE 5.0 12/17/2017 1440   GLUCOSEU NEGATIVE 12/17/2017 1440   HGBUR NEGATIVE 12/17/2017 1440   BILIRUBINUR NEGATIVE 12/17/2017 1440   KETONESUR 80 (A) 12/17/2017 1440   PROTEINUR 30 (A) 12/17/2017 1440   NITRITE NEGATIVE 12/17/2017 1440   LEUKOCYTESUR NEGATIVE 12/17/2017 1440   Sepsis Labs: @LABRCNTIP (procalcitonin:4,lacticidven:4)  ) Recent Results (from the past 240 hour(s))  Urine culture     Status: None   Collection Time: 12/17/17  2:43 PM  Result Value Ref Range Status   Specimen Description URINE, CATHETERIZED  Final   Special Requests NONE  Final   Culture   Final    NO GROWTH Performed at [facility], [address], [place], [place] [address]    Report Status 12/18/2017 FINAL  Final         Radiology Studies: Ct Angio Head W Or Wo Contrast  Result Date: 12/17/2017 CLINICAL DATA:  Acute infarct pons EXAM: CT ANGIOGRAPHY HEAD AND NECK TECHNIQUE: Multidetector CT imaging of the head and neck was performed using the standard protocol during bolus administration of intravenous contrast. Multiplanar CT image reconstructions and MIPs were obtained to evaluate the vascular anatomy. Carotid stenosis measurements (when applicable) are obtained utilizing NASCET criteria, using the distal internal carotid diameter as the denominator. CONTRAST:  50mL ISOVUE-370 IOPAMIDOL (ISOVUE-370) INJECTION 76% COMPARISON:  MRI head 12/17/2017 FINDINGS: CTA NECK FINDINGS Aortic arch: Mild atherosclerotic calcification aortic arch. Proximal great vessels are tortuous with mild atherosclerotic disease but widely patent. Right carotid system: Right common  carotid artery widely patent and tortuous. Mild atherosclerotic calcification right carotid bifurcation without stenosis Left carotid system: Tortuosity left common carotid artery which is widely patent. Left carotid bifurcation widely patent with minimal calcification. Vertebral arteries: Both vertebral arteries are patent to the basilar. Stenosis distal left vertebral artery. Skeleton: No acute skeletal abnormality. Other neck: Negative for mass or adenopathy Upper chest: Negative Review of the MIP images confirms the above findings CTA HEAD FINDINGS Anterior circulation: Circumferential calcification right supraclinoid internal carotid artery with moderate stenosis. Mild stenosis left supraclinoid internal carotid artery. Both anterior middle cerebral arteries are patent without occlusion. Mild irregularity in the middle cerebral artery branches and anterior cerebral artery branches bilaterally due to atherosclerotic disease. Posterior circulation: Moderate stenosis distal left vertebral artery due to calcified plaque. Mild stenosis mid basilar which may account for the pontine infarct. Superior cerebellar and posterior cerebral arteries patent bilaterally. Mild atherosclerotic stenosis throughout the posterior cerebral artery bilaterally. Venous sinuses: Patent Anatomic variants: None Delayed phase: Normal enhancement on delayed imaging Review of the MIP images confirms the above findings IMPRESSION: 1. No significant carotid or vertebral artery stenosis  in the neck 2. 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 Moderate stenosis distal right internal carotid artery and mild stenosis distal left internal carotid artery. Diffuse intracranial atherosclerotic disease. 4. Negative for emergent large vessel occlusion. 5. Mild stenosis mid basilar may account for the acute pontine infarct. Electronically Signed   By: Marlan Palau M.D.   On: 12/17/2017 19:15   Dg Chest 2 View  Result Date:  12/17/2017 CLINICAL DATA:  Slurred speech. EXAM: CHEST - 2 VIEW COMPARISON:  None. FINDINGS: Mild cardiomegaly. Normal pulmonary vascularity. Atherosclerotic calcification of the aortic arch. No focal consolidation, pleural effusion, or pneumothorax. No acute osseous abnormality. IMPRESSION: 1.  No active cardiopulmonary disease.  Mild cardiomegaly. 2.  Aortic atherosclerosis (ICD10-I70.0). Electronically Signed   By: Obie Dredge M.D.   On: 12/17/2017 19:30   Ct Angio Neck W Or Wo Contrast  Result Date: 12/17/2017 CLINICAL DATA:  Acute infarct pons EXAM: CT ANGIOGRAPHY HEAD AND NECK TECHNIQUE: Multidetector CT imaging of the head and neck was performed using the standard protocol during bolus administration of intravenous contrast. Multiplanar CT image reconstructions and MIPs were obtained to evaluate the vascular anatomy. Carotid stenosis measurements (when applicable) are obtained utilizing NASCET criteria, using the distal internal carotid diameter as the denominator. CONTRAST:  50mL ISOVUE-370 IOPAMIDOL (ISOVUE-370) INJECTION 76% COMPARISON:  MRI head 12/17/2017 FINDINGS: CTA NECK FINDINGS Aortic arch: Mild atherosclerotic calcification aortic arch. Proximal great vessels are tortuous with mild atherosclerotic disease but widely patent. Right carotid system: Right common carotid artery widely patent and tortuous. Mild atherosclerotic calcification right carotid bifurcation without stenosis Left carotid system: Tortuosity left common carotid artery which is widely patent. Left carotid bifurcation widely patent with minimal calcification. Vertebral arteries: Both vertebral arteries are patent to the basilar. Stenosis distal left vertebral artery. Skeleton: No acute skeletal abnormality. Other neck: Negative for mass or adenopathy Upper chest: Negative Review of the MIP images confirms the above findings CTA HEAD FINDINGS Anterior circulation: Circumferential calcification right supraclinoid internal  carotid artery with moderate stenosis. Mild stenosis left supraclinoid internal carotid artery. Both anterior middle cerebral arteries are patent without occlusion. Mild irregularity in the middle cerebral artery branches and anterior cerebral artery branches bilaterally due to atherosclerotic disease. Posterior circulation: Moderate stenosis distal left vertebral artery due to calcified plaque. Mild stenosis mid basilar which may account for the pontine infarct. Superior cerebellar and posterior cerebral arteries patent bilaterally. Mild atherosclerotic stenosis throughout the posterior cerebral artery bilaterally. Venous sinuses: Patent Anatomic variants: None Delayed phase: Normal enhancement on delayed imaging Review of the MIP images confirms the above findings IMPRESSION: 1. No significant carotid or vertebral artery stenosis in the neck 2. 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 Moderate stenosis distal right internal carotid artery and mild stenosis distal left internal carotid artery. Diffuse intracranial atherosclerotic disease. 4. Negative for emergent large vessel occlusion. 5. Mild stenosis mid basilar may account for the acute pontine infarct. Electronically Signed   By: Marlan Palau M.D.   On: 12/17/2017 19:15   Mr Brain Wo Contrast (neuro Protocol)  Result Date: 12/17/2017 CLINICAL DATA:  Focal neuro deficit, greater than 6 hours stroke suspected. Slurred speech beginning yesterday. EXAM: MRI HEAD WITHOUT CONTRAST TECHNIQUE: Multiplanar, multiecho pulse sequences of the brain and surrounding structures were obtained without intravenous contrast. COMPARISON:  CT head without contrast 12/17/2017 FINDINGS: Brain: Bilateral acute nonhemorrhagic infarcts are noted in the anterior pons. Right paramedian infarct slightly larger than the left. Associated T2 signal changes are  evident. Mild white matter changes are otherwise within normal limits for age. Ventricles are of normal  size. No significant extra-axial fluid collection is present. Vascular: Flow is present in the major intracranial arteries. Skull and upper cervical spine: The skull base is within normal limits. Craniocervical junction is normal. The upper cervical spine is within limits. Sinuses/Orbits: Mild mucosal thickening is present posteriorly in the left maxillary sinus. The remaining paranasal sinuses and the mastoid air cells are clear. Bilateral lens replacements are present. Globes and orbits are within normal limits. IMPRESSION: 1. 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 2. No evidence for other acute or remote infarcts. 3. Mild left maxillary sinus disease. Electronically Signed   By: Marin Roberts M.D.   On: 12/17/2017 16:25   Dg Swallowing Func-speech Pathology  Result Date: 12/18/2017 Objective Swallowing Evaluation: Type of Study: MBS-Modified Barium Swallow Study  Patient Details Name: Yakelin Grenier MRN: [id number] Date of Birth: [birth year] Today's Date: 12/18/2017 Time: SLP Start Time (ACUTE ONLY): 1230 -SLP Stop Time (ACUTE ONLY): 1252 SLP Time Calculation (min) (ACUTE ONLY): 22 min Past Medical History: Past Medical History: Diagnosis Date . Anxiety  . Arthritis  . Diabetes mellitus without complication (HCC)  . Hypertension  . Sinus problem  . Thyroid disease  Past Surgical History: Past Surgical History: Procedure Laterality Date . BREAST SURGERY   . EYE SURGERY   HPI: 77 y.o. female with medical history significant of HTN, Anxiety, DM, Hypothyroidism, Renal artery stenosis, was brought to the ED by her brother due to worsening speech difficulty. MRI was positive for acute bilateral pontine ischemic strokes.  No data recorded Assessment / Plan / Recommendation CHL IP CLINICAL IMPRESSIONS 12/18/2017 Clinical Impression Patient presents with a moderate oropharyngeal dysphagia. Orally, patient with mildly delayed A-P transit of bolus, intermttently decreased bolus cohesion,  and mild oral residuals with premature spillage over the base of the tongue. Following, delayed swallow initiation combined with decreased laryngeal closure results in deep penetration with eventual aspiration of viscosities thinner than pudding thick. Attempted use of chin tuck which was inconsistently effective. Should be noted however that patient with some difficulty with carryout of strategy. SLP wonders if strategy would have been effective with liquid consistencies if patient had been able to consistently utilize. Mild vallecular residuals remain post swallow with solids due to base of tongue weakness increasing risk of post swallow aspiration,  although airway generally better protected. Recommend initiation of a conservative diet, puree (dys 1) with pudding thick liquids with close SLP f/u.  SLP Visit Diagnosis Dysphagia, oropharyngeal phase (R13.12) Attention and concentration deficit following -- Frontal lobe and executive function deficit following -- Impact on safety and function Severe aspiration risk   CHL IP TREATMENT RECOMMENDATION 12/18/2017 Treatment Recommendations Therapy as outlined in treatment plan below   Prognosis 12/18/2017 Prognosis for Safe Diet Advancement Good Barriers to Reach Goals -- Barriers/Prognosis Comment -- [facility]L IP DIET RECOMMENDATION 12/18/2017 SLP Diet Recommendations Dysphagia 1 (Puree) solids;Pudding thick liquid Liquid Administration via Spoon Medication Administration Crushed with puree Compensations Small sips/bites;Slow rate Postural Changes Seated upright at 90 degrees   CHL IP OTHER RECOMMENDATIONS 12/18/2017 Recommended Consults -- Oral Care Recommendations Oral care BID Other Recommendations Order thickener from pharmacy;Prohibited food (jello, ice cream, thin soups);Remove water pitcher   CHL IP FOLLOW UP RECOMMENDATIONS 12/18/2017 Follow up Recommendations Inpatient Rehab   CHL IP FREQUENCY AND DURATION 12/18/2017 Speech Therapy Frequency (ACUTE ONLY) min 3x week  Treatment Duration 2 weeks      CHL IP ORAL  PHASE 12/18/2017 Oral Phase Impaired Oral - Pudding Teaspoon -- Oral - Pudding Cup -- Oral - Honey Teaspoon Lingual/palatal residue;Delayed oral transit Oral - Honey Cup Lingual/palatal residue;Delayed oral transit Oral - Nectar Teaspoon Lingual/palatal residue;Delayed oral transit Oral - Nectar Cup Lingual/palatal residue;Delayed oral transit Oral - Nectar Straw -- Oral - Thin Teaspoon Lingual/palatal residue;Delayed oral transit Oral - Thin Cup Lingual/palatal residue;Delayed oral transit Oral - Thin Straw -- Oral - Puree Lingual/palatal residue;Delayed oral transit Oral - Mech Soft Lingual/palatal residue;Delayed oral transit Oral - Regular -- Oral - Multi-Consistency -- Oral - Pill -- Oral Phase - Comment --  CHL IP PHARYNGEAL PHASE 12/18/2017 Pharyngeal Phase Impaired Pharyngeal- Pudding Teaspoon -- Pharyngeal -- Pharyngeal- Pudding Cup -- Pharyngeal -- Pharyngeal- Honey Teaspoon Delayed swallow initiation-vallecula;Penetration/Aspiration during swallow;Reduced airway/laryngeal closure Pharyngeal Material enters airway, CONTACTS cords and not ejected out;Material enters airway, passes BELOW cords without attempt by patient to eject out (silent aspiration) Pharyngeal- Honey Cup Delayed swallow initiation-pyriform sinuses;Reduced airway/laryngeal closure;Penetration/Aspiration during swallow Pharyngeal Material enters airway, passes BELOW cords without attempt by patient to eject out (silent aspiration);Material enters airway, CONTACTS cords and not ejected out Pharyngeal- Nectar Teaspoon Delayed swallow initiation-vallecula;Reduced airway/laryngeal closure;Penetration/Aspiration during swallow Pharyngeal Material enters airway, CONTACTS cords and then ejected out;Material enters airway, passes BELOW cords without attempt by patient to eject out (silent aspiration) Pharyngeal- Nectar Cup Delayed swallow initiation-pyriform sinuses;Reduced airway/laryngeal  closure;Penetration/Aspiration during swallow Pharyngeal Material enters airway, CONTACTS cords and not ejected out;Material enters airway, passes BELOW cords without attempt by patient to eject out (silent aspiration) Pharyngeal- Nectar Straw -- Pharyngeal -- Pharyngeal- Thin Teaspoon Delayed swallow initiation-pyriform sinuses;Reduced airway/laryngeal closure;Penetration/Aspiration during swallow Pharyngeal Material enters airway, CONTACTS cords and not ejected out Pharyngeal- Thin Cup Delayed swallow initiation-pyriform sinuses;Reduced airway/laryngeal closure;Penetration/Aspiration during swallow Pharyngeal Material enters airway, passes BELOW cords and not ejected out despite cough attempt by patient Pharyngeal- Thin Straw -- Pharyngeal -- Pharyngeal- Puree Delayed swallow initiation-vallecula;Reduced tongue base retraction;Pharyngeal residue - valleculae Pharyngeal -- Pharyngeal- Mechanical Soft Delayed swallow initiation-vallecula;Reduced tongue base retraction;Pharyngeal residue - valleculae Pharyngeal -- Pharyngeal- Regular -- Pharyngeal -- Pharyngeal- Multi-consistency -- Pharyngeal -- Pharyngeal- Pill -- Pharyngeal -- Pharyngeal Comment --  CHL IP CERVICAL ESOPHAGEAL PHASE 12/18/2017 Cervical Esophageal Phase WFL Pudding Teaspoon -- Pudding Cup -- Honey Teaspoon -- Honey Cup -- Nectar Teaspoon -- Nectar Cup -- Nectar Straw -- Thin Teaspoon -- Thin Cup -- Thin Straw -- Puree -- Mechanical Soft -- Regular -- Multi-consistency -- Pill -- Cervical Esophageal Comment -- Ferdinand Lango MA, CCC-SLP McCoy Leah Meryl 12/18/2017, 1:13 PM              Mr Maxine Glenn Head Wo Contrast  Result Date: 12/17/2017 CLINICAL DATA:  Acute pontine stroke EXAM: MRA HEAD WITHOUT CONTRAST TECHNIQUE: Angiographic images of the Circle of Willis were obtained using MRA technique without intravenous contrast. COMPARISON:  CT a head 12/17/2017, MRI head 12/17/2017 FINDINGS: Image quality degraded by significant motion. CTA head is of better  quality. See those results. Negative for emergent large vessel occlusion. Mild stenosis mid pons. Atherosclerotic irregularity throughout the anterior cerebral arteries, posterior cerebral arteries, and middle cerebral arteries bilaterally. IMPRESSION: Study limited by motion.  See prior CTA results Electronically Signed   By: Marlan Palau M.D.   On: 12/17/2017 19:47        Scheduled Meds: . aspirin  300 mg Rectal Daily   Or  . aspirin  325 mg Oral Daily  . atenolol  25 mg Oral Daily  . clopidogrel  75  mg Oral Daily  . enoxaparin (LOVENOX) injection  40 mg Subcutaneous Q24H  . ezetimibe  10 mg Oral Daily  . mouth rinse  15 mL Mouth Rinse q12n4p  . Synthroid tablet (no blue dye)  100 mcg Oral Q24H   Continuous Infusions: . sodium chloride 10 mL/hr at 12/18/17 0815     LOS: 2 days    Time spent:    Zannie Cove, MD [facility] Page via [email or web address], password [email or web address] After 7PM please contact night-coverage  12/19/2017, 2:01 PM

## 2017-12-20 ENCOUNTER — Other Ambulatory Visit: Payer: Self-pay

## 2017-12-20 ENCOUNTER — Inpatient Hospital Stay (HOSPITAL_COMMUNITY)
Admission: RE | Admit: 2017-12-20 | Discharge: 2018-01-10 | DRG: 057 | Disposition: A | Discharge status: Skilled Nursing Facility | Payer: Medicare Other | Source: Intra-hospital | Attending: Physical Medicine & Rehabilitation | Admitting: Physical Medicine & Rehabilitation

## 2017-12-20 ENCOUNTER — Encounter (HOSPITAL_COMMUNITY): Payer: Self-pay | Admitting: *Deleted

## 2017-12-20 ENCOUNTER — Encounter (HOSPITAL_COMMUNITY): Payer: Self-pay

## 2017-12-20 DIAGNOSIS — I160 Hypertensive urgency: Secondary | ICD-10-CM | POA: Diagnosis not present

## 2017-12-20 DIAGNOSIS — Z8349 Family history of other endocrine, nutritional and metabolic diseases: Secondary | ICD-10-CM

## 2017-12-20 DIAGNOSIS — Z823 Family history of stroke: Secondary | ICD-10-CM

## 2017-12-20 DIAGNOSIS — Z833 Family history of diabetes mellitus: Secondary | ICD-10-CM | POA: Diagnosis not present

## 2017-12-20 DIAGNOSIS — I69391 Dysphagia following cerebral infarction: Secondary | ICD-10-CM | POA: Diagnosis not present

## 2017-12-20 DIAGNOSIS — Z882 Allergy status to sulfonamides status: Secondary | ICD-10-CM | POA: Diagnosis not present

## 2017-12-20 DIAGNOSIS — E119 Type 2 diabetes mellitus without complications: Secondary | ICD-10-CM | POA: Diagnosis present

## 2017-12-20 DIAGNOSIS — R1312 Dysphagia, oropharyngeal phase: Secondary | ICD-10-CM | POA: Diagnosis present

## 2017-12-20 DIAGNOSIS — F411 Generalized anxiety disorder: Secondary | ICD-10-CM | POA: Diagnosis not present

## 2017-12-20 DIAGNOSIS — I10 Essential (primary) hypertension: Secondary | ICD-10-CM | POA: Diagnosis present

## 2017-12-20 DIAGNOSIS — I69321 Dysphasia following cerebral infarction: Secondary | ICD-10-CM

## 2017-12-20 DIAGNOSIS — Z7982 Long term (current) use of aspirin: Secondary | ICD-10-CM

## 2017-12-20 DIAGNOSIS — E785 Hyperlipidemia, unspecified: Secondary | ICD-10-CM | POA: Diagnosis present

## 2017-12-20 DIAGNOSIS — Z79890 Hormone replacement therapy (postmenopausal): Secondary | ICD-10-CM | POA: Diagnosis not present

## 2017-12-20 DIAGNOSIS — E039 Hypothyroidism, unspecified: Secondary | ICD-10-CM | POA: Diagnosis present

## 2017-12-20 DIAGNOSIS — I701 Atherosclerosis of renal artery: Secondary | ICD-10-CM | POA: Diagnosis present

## 2017-12-20 DIAGNOSIS — E871 Hypo-osmolality and hyponatremia: Secondary | ICD-10-CM | POA: Diagnosis not present

## 2017-12-20 DIAGNOSIS — R0989 Other specified symptoms and signs involving the circulatory and respiratory systems: Secondary | ICD-10-CM

## 2017-12-20 DIAGNOSIS — Z91048 Other nonmedicinal substance allergy status: Secondary | ICD-10-CM | POA: Diagnosis not present

## 2017-12-20 DIAGNOSIS — R339 Retention of urine, unspecified: Secondary | ICD-10-CM | POA: Diagnosis present

## 2017-12-20 DIAGNOSIS — M1990 Unspecified osteoarthritis, unspecified site: Secondary | ICD-10-CM | POA: Diagnosis present

## 2017-12-20 DIAGNOSIS — I6350 Cerebral infarction due to unspecified occlusion or stenosis of unspecified cerebral artery: Secondary | ICD-10-CM | POA: Diagnosis not present

## 2017-12-20 DIAGNOSIS — I69322 Dysarthria following cerebral infarction: Secondary | ICD-10-CM | POA: Diagnosis not present

## 2017-12-20 DIAGNOSIS — I69393 Ataxia following cerebral infarction: Secondary | ICD-10-CM | POA: Diagnosis not present

## 2017-12-20 DIAGNOSIS — Z791 Long term (current) use of non-steroidal anti-inflammatories (NSAID): Secondary | ICD-10-CM | POA: Diagnosis not present

## 2017-12-20 DIAGNOSIS — E876 Hypokalemia: Secondary | ICD-10-CM | POA: Diagnosis not present

## 2017-12-20 DIAGNOSIS — Z888 Allergy status to other drugs, medicaments and biological substances status: Secondary | ICD-10-CM

## 2017-12-20 DIAGNOSIS — M1711 Unilateral primary osteoarthritis, right knee: Secondary | ICD-10-CM | POA: Diagnosis present

## 2017-12-20 DIAGNOSIS — Z8249 Family history of ischemic heart disease and other diseases of the circulatory system: Secondary | ICD-10-CM | POA: Diagnosis not present

## 2017-12-20 DIAGNOSIS — I69320 Aphasia following cerebral infarction: Secondary | ICD-10-CM | POA: Diagnosis present

## 2017-12-20 DIAGNOSIS — R42 Dizziness and giddiness: Secondary | ICD-10-CM | POA: Diagnosis not present

## 2017-12-20 HISTORY — DX: Cerebral infarction due to unspecified occlusion or stenosis of unspecified cerebral artery: I63.50

## 2017-12-20 LAB — CBC
HCT: 40.6 % (ref 36.0–46.0)
Hemoglobin: 13.1 g/dL (ref 12.0–15.0)
MCH: 26.7 pg (ref 26.0–34.0)
MCHC: 32.3 g/dL (ref 30.0–36.0)
MCV: 82.7 fL (ref 80.0–100.0)
PLATELETS: 326 10*3/uL (ref 150–400)
RBC: 4.91 MIL/uL (ref 3.87–5.11)
RDW: 13.2 % (ref 11.5–15.5)
WBC: 9 10*3/uL (ref 4.0–10.5)
nRBC: 0 % (ref 0.0–0.2)

## 2017-12-20 LAB — CREATININE, SERUM
CREATININE: 0.73 mg/dL (ref 0.44–1.00)
GFR calc Af Amer: >60 mL/min (ref >60)
GFR calc non Af Amer: >60 mL/min (ref >60)

## 2017-12-20 MED ORDER — LEVOTHYROXINE SODIUM 100 MCG PO TABS
100.0000 ug | ORAL_TABLET | Freq: Every day | ORAL | Status: DC
  Administered 2017-12-21 – 2018-01-10 (×20): 100 ug via ORAL
  Filled 2017-12-20 (×21): qty 1

## 2017-12-20 MED ORDER — ATENOLOL 25 MG PO TABS
25.0000 mg | ORAL_TABLET | Freq: Every day | ORAL | Status: DC
  Administered 2017-12-21 – 2018-01-10 (×20): 25 mg via ORAL
  Filled 2017-12-20 (×21): qty 1

## 2017-12-20 MED ORDER — CLOPIDOGREL BISULFATE 75 MG PO TABS
75.0000 mg | ORAL_TABLET | Freq: Every day | ORAL | Status: DC
  Administered 2017-12-21 – 2018-01-10 (×21): 75 mg via ORAL
  Filled 2017-12-20 (×21): qty 1

## 2017-12-20 MED ORDER — NONFORMULARY OR COMPOUNDED ITEM: 100.0000 ug | Status: DC

## 2017-12-20 MED ORDER — ACETAMINOPHEN 160 MG/5ML PO SOLN: 650.0000 mg | ORAL | Status: DC | PRN

## 2017-12-20 MED ORDER — SORBITOL 70 % SOLN
30.0000 mL | Freq: Every day | Status: DC | PRN
  Administered 2017-12-21 – 2017-12-26 (×2): 30 mL via ORAL
  Filled 2017-12-20 (×4): qty 30

## 2017-12-20 MED ORDER — RESOURCE THICKENUP CLEAR PO POWD: ORAL | Status: DC | PRN

## 2017-12-20 MED ORDER — ASPIRIN 325 MG PO TABS: 325.0000 mg | ORAL_TABLET | Freq: Every day | ORAL | Status: DC

## 2017-12-20 MED ORDER — ENOXAPARIN SODIUM 40 MG/0.4ML ~~LOC~~ SOLN
40.0000 mg | SUBCUTANEOUS | Status: DC
  Administered 2017-12-20 – 2018-01-09 (×21): 40 mg via SUBCUTANEOUS
  Filled 2017-12-20 (×21): qty 0.4

## 2017-12-20 MED ORDER — ASPIRIN 300 MG RE SUPP: 300.0000 mg | Freq: Every day | RECTAL | Status: DC

## 2017-12-20 MED ORDER — EZETIMIBE 10 MG PO TABS: 10.0000 mg | ORAL_TABLET | Freq: Every day | ORAL | Status: DC

## 2017-12-20 MED ORDER — CLONIDINE HCL 0.1 MG PO TABS
0.1000 mg | ORAL_TABLET | ORAL | Status: DC | PRN
  Administered 2017-12-20 – 2017-12-21 (×2): 0.1 mg via ORAL
  Filled 2017-12-20 (×2): qty 1

## 2017-12-20 MED ORDER — ACETAMINOPHEN 650 MG RE SUPP: 650.0000 mg | RECTAL | Status: DC | PRN

## 2017-12-20 MED ORDER — CLOPIDOGREL BISULFATE 75 MG PO TABS: 75.0000 mg | ORAL_TABLET | Freq: Every day | ORAL | Status: DC

## 2017-12-20 MED ORDER — ENOXAPARIN SODIUM 40 MG/0.4ML ~~LOC~~ SOLN: 40.0000 mg | SUBCUTANEOUS | Status: DC

## 2017-12-20 MED ORDER — SENNOSIDES-DOCUSATE SODIUM 8.6-50 MG PO TABS
1.0000 | ORAL_TABLET | Freq: Every evening | ORAL | Status: DC | PRN
  Administered 2017-12-21 – 2018-01-06 (×4): 1 via ORAL
  Filled 2017-12-20 (×4): qty 1

## 2017-12-20 MED ORDER — EZETIMIBE 10 MG PO TABS
10.0000 mg | ORAL_TABLET | Freq: Every day | ORAL | Status: DC
  Administered 2017-12-21 – 2018-01-10 (×21): 10 mg via ORAL
  Filled 2017-12-20 (×21): qty 1

## 2017-12-20 MED ORDER — LORAZEPAM 1 MG PO TABS
1.0000 mg | ORAL_TABLET | Freq: Three times a day (TID) | ORAL | Status: DC | PRN
  Administered 2017-12-20 – 2017-12-29 (×20): 1 mg via ORAL
  Filled 2017-12-20 (×21): qty 1

## 2017-12-20 MED ORDER — ASPIRIN 325 MG PO TABS
325.0000 mg | ORAL_TABLET | Freq: Every day | ORAL | Status: DC
  Administered 2017-12-21 – 2018-01-10 (×21): 325 mg via ORAL
  Filled 2017-12-20 (×21): qty 1

## 2017-12-20 MED ORDER — ACETAMINOPHEN 325 MG PO TABS
650.0000 mg | ORAL_TABLET | ORAL | Status: DC | PRN
  Administered 2017-12-20 – 2018-01-09 (×21): 650 mg via ORAL
  Filled 2017-12-20 (×24): qty 2

## 2017-12-20 MED FILL — Levothyroxine Sodium Tab 100 MCG: ORAL | Qty: 1 | Status: AC

## 2017-12-20 NOTE — PMR Pre-admission (Signed)
PMR Admission Coordinator Pre-Admission Assessment  Patient: Brandy Flowers is an 77 y.o., female MRN: [id number] DOB: 26-Oct-1940 Height:   Weight:                Insurance Information HMO: No   PPO:       PCP:       IPA:       80/20:       OTHER:   PRIMARY: Medicare A and B      Policy#: [id number]      Subscriber: patient CM Name:        Phone#:       Fax#:   Pre-Cert#:        Employer:   Benefits:  Phone #:       Name: Checked in passport one source Eff. Date: 11/16/06     Deduct:  $1364      Out of Pocket Max: None      Life Max: N/A CIR: 100%      SNF: 100 days Outpatient: 80%     Co-Pay: 20% Home Health: 100%      Co-Pay: none DME: 80%     Co-Pay: 20% Providers: patient's choice  SECONDARY: BCBS federal emp PPO      Policy#: [id number]      Subscriber:  patient CM Name:        Phone#:       Fax#:   Pre-Cert#:        Employer:   Benefits:  Phone #: [phone]     Name:   Eff. Date:       Deduct:        Out of Pocket Max:        Life Max:   CIR:        SNF:   Outpatient:       Co-Pay:   Home Health:        Co-Pay:   DME:       Co-Pay:    Medicaid Application Date:        Case Manager:   Disability Application Date:        Case Worker:    Emergency Contact Information Contact Information    Name Relation Home Work Mobile   [place] Brother [phone]       Current Medical History  Patient Admitting Diagnosis: Bilateral anterior pontine nonhemorrhagic infarction   History of Present Illness: A 77 year old right handed female history of hypertension, renal artery stenosis and diabetes mellitus. Per chart review and patient, patient lives alone. Independent prior to admission. One level home with 2 steps to entry. She has a brother who is a Engineer, civil (consulting) and states she will be living with him post discharge. Patient presented 12/17/2017 with slurred speech and altered mental status. Cranial CT scan reviewed, unremarkable for acute intracranial process. MRI showed  acute/subacute bilateral anterior pontine nonhemorrhagic infarction. Patient did not receive TPA. CT angiogram of head and neck with no significant carotid or vertebral artery stenosis. Negative for emergent large vessel occlusion. Echocardiogram with ejection fraction of 70% grade 1 diastolic dysfunction. No wall motion abnormalities. Maintained on aspirin and Plavix 3 months and Plavix alone. Subcutaneous Lovenox for DVT prophylaxis.Dysphagia #1 pudding thick liquid diet. Therapy evaluations completed with recommendations of physical medicine rehabilitation consult. Patient to be admitted for a comprehensive inpatient rehabilitation program.   Complete NIHSS TOTAL: 3  Past Medical History  Past Medical History:  Diagnosis Date  . Anxiety   . Arthritis   .  Diabetes mellitus without complication (HCC)   . Hypertension   . Sinus problem   . Thyroid disease     Family History  family history includes Diabetes in her mother; Heart attack in her father; Heart disease in her brother, father, and mother; Hyperlipidemia in her mother; Hypertension in her brother and mother; Stroke in her mother.  Prior Rehab/Hospitalizations:  Has the patient had major surgery during 100 days prior to admission? No  Current Medications   Current Facility-Administered Medications:  .  0.9 %  sodium chloride infusion, , Intravenous, Continuous, Zannie Cove, MD, Last Rate: 10 mL/hr at 12/18/17 0815 .  acetaminophen (TYLENOL) tablet 650 mg, 650 mg, Oral, Q4H PRN, 650 mg at 12/18/17 1525 **OR** acetaminophen (TYLENOL) solution 650 mg, 650 mg, Per Tube, Q4H PRN **OR** acetaminophen (TYLENOL) suppository 650 mg, 650 mg, Rectal, Q4H PRN, Zannie Cove, MD .  aspirin suppository 300 mg, 300 mg, Rectal, Daily **OR** aspirin tablet 325 mg, 325 mg, Oral, Daily, Zannie Cove, MD, 325 mg at 12/20/17 1014 .  atenolol (TENORMIN) tablet 25 mg, 25 mg, Oral, Daily, Zannie Cove, MD, 25 mg at 12/20/17 1016 .   clopidogrel (PLAVIX) tablet 75 mg, 75 mg, Oral, Daily, Marvel Plan, MD, 75 mg at 12/19/17 1243 .  enoxaparin (LOVENOX) injection 40 mg, 40 mg, Subcutaneous, Q24H, Zannie Cove, MD, 40 mg at 12/19/17 2328 .  ezetimibe (ZETIA) tablet 10 mg, 10 mg, Oral, Daily, Marvel Plan, MD, 10 mg at 12/20/17 1017 .  LORazepam (ATIVAN) tablet 1 mg, 1 mg, Oral, TID PRN, Zannie Cove, MD, 1 mg at 12/20/17 1015 .  MEDLINE mouth rinse, 15 mL, Mouth Rinse, q12n4p, Zannie Cove, MD, 15 mL at 12/19/17 1754 .  RESOURCE THICKENUP CLEAR, , Oral, PRN, Zannie Cove, MD .  senna-docusate (Senokot-S) tablet 1 tablet, 1 tablet, Oral, QHS PRN, Zannie Cove, MD .  Synthroid tablet (no blue dye), 100 mcg, Oral, Q24H, Zannie Cove, MD, 100 mcg at 12/20/17 1610  Patients Current Diet:  Diet Order            DIET - DYS 1 Room service appropriate? Yes; Fluid consistency: Pudding Thick  Diet effective now              Precautions / Restrictions Precautions Precautions: Fall Restrictions Weight Bearing Restrictions: No   Has the patient had 2 or more falls or a fall with injury in the past year?Yes.  Patient reports one fall this past year in the snow with an ankle injury.  Prior Activity Level Community (5-7x/wk): Went out about 3 X a week, was driving.  Home Assistive Devices / Equipment Home Assistive Devices/Equipment: None Home Equipment: None  Prior Device Use: Indicate devices/aids used by the patient prior to current illness, exacerbation or injury? None  Prior Functional Level Prior Function Level of Independence: Independent Comments: Pt was fully independent PTA.    Self Care: Did the patient need help bathing, dressing, using the toilet or eating?  Independent  Indoor Mobility: Did the patient need assistance with walking from room to room (with or without device)? Independent  Stairs: Did the patient need assistance with internal or external stairs (with or without device)?  Independent  Functional Cognition: Did the patient need help planning regular tasks such as shopping or remembering to take medications? Independent  Current Functional Level Cognition  Arousal/Alertness: Awake/alert Overall Cognitive Status: No family/caregiver present to determine baseline cognitive functioning Current Attention Level: Sustained Orientation Level: Oriented X4 General Comments: brother present upon  PT arrival, however quickly excusing himself Attention: Sustained Sustained Attention: Appears intact(but pt describes onset of headache while trying to focus) Memory: Appears intact Awareness: Impaired Awareness Impairment: Intellectual impairment Problem Solving: Impaired Problem Solving Impairment: Verbal basic, Functional basic Safety/Judgment: Impaired    Extremity Assessment (includes Sensation/Coordination)  Upper Extremity Assessment: Defer to OT evaluation 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 RUE Coordination: decreased gross motor LUE Deficits / Details: grossly 4/5   Lower Extremity Assessment: Generalized weakness(BLE motor 3-/5)    ADLs  Overall ADL's : Needs assistance/impaired Eating/Feeding: NPO, Sitting Grooming: Wash/dry hands, Wash/dry face, Oral care, Brushing hair, Minimal assistance, Sitting Upper Body Bathing: Minimal assistance, Sitting Lower Body Bathing: Maximal assistance, Sit to/from stand Upper Body Dressing : Moderate assistance, Sitting Lower Body Dressing: Total assistance, Sit to/from stand Lower Body Dressing Details (indicate cue type and reason): Pt states she can doff her socks, but when asked to do so, she was unable as she became dizzy when she attempted to lean forward  Toilet Transfer: Maximal assistance, +2 for safety/equipment, Squat-pivot, BSC Toileting- Clothing Manipulation and Hygiene: Total assistance, Sit to/from stand Functional mobility during ADLs: Maximal assistance    Mobility  Overal bed mobility: Needs  Assistance Bed Mobility: Supine to Sit Sidelying to sit: Min assist Supine to sit: Min assist Sit to supine: Min assist General bed mobility comments: Min A for LE and trunk management; use of bed pad to right hips    Transfers  Overall transfer level: Needs assistance Equipment used: Rolling walker (2 wheeled) Transfers: Sit to/from Stand Sit to Stand: Mod assist, Min assist, +2 physical assistance General transfer comment: Min-Mod A +2 to pwoer up at bedside; poor hip/knee extension upon standing; requires cueing for hand placement    Ambulation / Gait / Stairs / Wheelchair Mobility  Ambulation/Gait Ambulation/Gait assistance: Min assist, +2 physical assistance Gait Distance (Feet): 6 Feet Assistive device: Rolling walker (2 wheeled) Gait Pattern/deviations: Step-to pattern, Step-through pattern, Decreased stride length, Shuffle, Trunk flexed General Gait Details: poor hip/knee extension; requires cueing throughout for safety; quickly sitting as pateitn becomed tearful and frustrated Gait velocity: decreased    Posture / Balance Balance Overall balance assessment: Needs assistance Sitting-balance support: No upper extremity supported, Feet supported Sitting balance-Leahy Scale: Poor Standing balance support: Bilateral upper extremity supported, During functional activity Standing balance-Leahy Scale: Poor    Special needs/care consideration BiPAP/CPAP No CPM No Continuous Drip IV No Dialysis No        Life Vest No Oxygen No Special Bed No Trach Size No Wound Vac (area) No       Skin No                             Bowel mgmt: Last BM 12/16/17 Bladder mgmt: Catheterizations due to unable to start voiding Diabetic mgmt Yes, controlled with diet, no medications.    Previous Home Environment Living Arrangements: Alone  Lives With: Alone Available Help at Discharge: Family, Friend(s), Available 24 hours/day Home Layout: One level Home Access: Stairs to enter Entrance  Stairs-Rails: Right Entrance Stairs-Number of Steps: 2 Bathroom Shower/Tub: Engineer, manufacturing systems: Standard Home Care Services: No Additional Comments: lives with her dog.  She is unable to tell me who will stay with her, but insists that she will have 24 hour physical assist, and that someone is taking care of that   Discharge Living Setting Plans for Discharge Living Setting: Alone, House Type of Home  at Discharge: House Discharge Home Layout: One level Discharge Home Access: Stairs to enter Entrance Stairs-Number of Steps: 3 Discharge Bathroom Shower/Tub: Tub/shower unit, Door Discharge Bathroom Toilet: Standard Does the patient have any problems obtaining your medications?: No  Social/Family/Support Systems Patient Roles: Other (Comment)(Has a brother and friends.) Contact Information: Trisha Mangle - brother - "Chip" Anticipated Caregiver: Brother, friends Debroah Loop and Rose Anticipated [employer] Information: Chip - brother - [phone] Ability/Limitations of Caregiver: Brother works as a Engineer, civil (consulting) in the cath lab her at [employer].  Rose a friend lives down the street.  Also has friend Carlus Pavlov who can stay with patient some. Caregiver Availability: Other (Comment)(Patient is aware that she will likely need someone to stay ) Discharge Plan Discussed with Primary Caregiver: Yes Is Caregiver In Agreement with Plan?: Yes Does Caregiver/Family have Issues with Lodging/Transportation while Pt is in Rehab?: No  Goals/Additional Needs Patient/Family Goal for Rehab: PT/OT supervision to min assist goals, SLP mod I goals Expected length of stay: 15-19 days Cultural Considerations: None Dietary Needs: Dys 1, pudding thick liquids Equipment Needs: TBD Pt/Family Agrees to Admission and willing to participate: Yes Program Orientation Provided & Reviewed with Pt/Caregiver Including Roles  & Responsibilities: Yes  Decrease burden of Care through IP rehab  admission: N/A  Possible need for SNF placement upon discharge: Not planned  Patient Condition: This patient's condition remains as documented in the consult dated 12/19/17, in which the Rehabilitation Physician determined and documented that the patient's condition is appropriate for intensive rehabilitative care in an inpatient rehabilitation facility pending patient is now agreeable to inpatient rehab admission. These areas have been addressed. Patient will have assistance from her brother and 2 friends who can share staying with patient at discharge.  Will admit to inpatient rehab today.  Preadmission Screen Completed By:  Trish Mage, 12/20/2017 11:51 AM ______________________________________________________________________   Discussed status with Dr. Riley Kill on 12/20/17 at 1151 and received telephone approval for admission today.  Admission Coordinator:  Trish Mage, time 1151/Date 12/20/17

## 2017-12-20 NOTE — H&P (Signed)
Physical Medicine and Rehabilitation Admission H&P    Chief Complaint  Patient presents with  . Aphasia  : HPI: Brandy Flowers is a 77 year old right handed female history of hypertension, renal artery stenosis and diabetes mellitus. Per chart review and patient, patient lives alone. Independent prior to admission. One level home with 2 steps to entry. She has a brother who is a Engineer, civil (consulting) and states she will be living with him post discharge. Patient presented 12/17/2017 with slurred speech and altered mental status. Cranial CT scan reviewed, unremarkable for acute intracranial process. 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 Patient did not receive TPA. CT angiogram of head and neck with no significant carotid or vertebral artery stenosis. Negative for emergent large vessel occlusion. Echocardiogram with ejection fraction of 70% grade 1 diastolic dysfunction. No wall motion abnormalities. Maintained on aspirin and Plavix 3 months and Plavix alone. Subcutaneous Lovenox for DVT prophylaxis.Dysphagia #1 pudding thick liquid diet. Therapy evaluations completed with recommendations of physical medicine rehabilitation consult. Patient was admitted for a comprehensive rehabilitation program.  Review of Systems  Constitutional: Negative for chills and fever.  HENT: Negative for hearing loss.   Eyes: Negative for blurred vision and double vision.  Respiratory: Negative for cough and shortness of breath.   Cardiovascular: Positive for leg swelling. Negative for chest pain and palpitations.  Gastrointestinal: Positive for constipation.  Musculoskeletal: Positive for joint pain and myalgias.  Skin: Negative for rash.  Neurological: Positive for dizziness, speech change, focal weakness and headaches.  Psychiatric/Behavioral:       Anxiety  All other systems reviewed and are negative.  Past Medical History:  Diagnosis Date  . Anxiety   . Arthritis   . Diabetes  mellitus without complication (HCC)   . Hypertension   . Sinus problem   . Thyroid disease    Past Surgical History:  Procedure Laterality Date  . BREAST SURGERY    . EYE SURGERY     Family History  Problem Relation Age of Onset  . Stroke Mother   . Diabetes Mother   . Heart disease Mother        Before age 81  . Hypertension Mother   . Hyperlipidemia Mother   . Heart disease Father   . Heart attack Father   . Heart disease Brother        After age 51- CABG  . Hypertension Brother    Social History:  reports that she has never smoked. She has never used smokeless tobacco. She reports that she does not drink alcohol or use drugs. Allergies:  Allergies  Allergen Reactions  . Blue Dyes (Parenteral) Shortness Of Breath and Rash  . Sulfa Antibiotics Anaphylaxis  . Ace Inhibitors Nausea And Vomiting  . Norvasc [Amlodipine] Nausea And Vomiting  . Calcium Channel Blockers Diarrhea   Medications Prior to Admission  Medication Sig Dispense Refill  . acetaminophen-codeine (TYLENOL #3) 300-30 MG per tablet Take 0.5 tablets by mouth daily as needed (severe headache).     Marland Kitchen aspirin EC 325 MG tablet Take 325-650 mg by mouth 3 (three) times daily as needed (headache).    Marland Kitchen atenolol (TENORMIN) 50 MG tablet Take 100 mg by mouth daily.     Marland Kitchen levothyroxine (SYNTHROID, LEVOTHROID) 50 MCG tablet Take 100 mcg by mouth daily before breakfast.     . lidocaine (LIDODERM) 5 % Place 1 patch onto the skin daily as needed (pain).     . LORazepam (ATIVAN) 1 MG tablet Take 1  mg by mouth See admin instructions. Take 1 tablet (1 mg) by mouth twice daily, may take a 3rd tablet (1 mg) during the day as needed for SBP >170, may also take a 4th tablet during the day as needed for anxiety.  5  . meloxicam (MOBIC) 15 MG tablet Take 15 mg by mouth daily as needed (leg pain).     . nitroGLYCERIN (NITROSTAT) 0.4 MG SL tablet Place 0.4 mg under the tongue daily as needed (SBP >180).   99    Drug Regimen  Review Drug regimen was reviewed and remains appropriate with no significant issues identified  Home: Home Living Family/patient expects to be discharged to:: Private residence Living Arrangements: Alone Available Help at Discharge: Family, Friend(s), Available 24 hours/day Home Access: Stairs to enter Secretary/administrator of Steps: 2 Entrance Stairs-Rails: Right Home Layout: One level Bathroom Shower/Tub: Engineer, manufacturing systems: Standard Home Equipment: None Additional Comments: lives with her dog.  She is unable to tell me who will stay with her, but insists that she will have 24 hour physical assist, and that someone is taking care of that   Lives With: Alone   Functional History: Prior Function Level of Independence: Independent Comments: Pt was fully independent PTA.    Functional Status:  Mobility: Bed Mobility Overal bed mobility: Needs Assistance Bed Mobility: Supine to Sit Sidelying to sit: Min assist Supine to sit: Min assist Sit to supine: Min assist General bed mobility comments: Min A for LE and trunk management; use of bed pad to right hips Transfers Overall transfer level: Needs assistance Equipment used: Rolling walker (2 wheeled) Transfers: Sit to/from Stand Sit to Stand: Mod assist, Min assist, +2 physical assistance General transfer comment: Min-Mod A +2 to pwoer up at bedside; poor hip/knee extension upon standing; requires cueing for hand placement Ambulation/Gait Ambulation/Gait assistance: Min assist, +2 physical assistance Gait Distance (Feet): 6 Feet Assistive device: Rolling walker (2 wheeled) Gait Pattern/deviations: Step-to pattern, Step-through pattern, Decreased stride length, Shuffle, Trunk flexed General Gait Details: poor hip/knee extension; requires cueing throughout for safety; quickly sitting as pateitn becomed tearful and frustrated Gait velocity: decreased    ADL: ADL Overall ADL's : Needs  assistance/impaired Eating/Feeding: NPO, Sitting Grooming: Wash/dry hands, Wash/dry face, Oral care, Brushing hair, Minimal assistance, Sitting Upper Body Bathing: Minimal assistance, Sitting Lower Body Bathing: Maximal assistance, Sit to/from stand Upper Body Dressing : Moderate assistance, Sitting Lower Body Dressing: Total assistance, Sit to/from stand Lower Body Dressing Details (indicate cue type and reason): Pt states she can doff her socks, but when asked to do so, she was unable as she became dizzy when she attempted to lean forward  Toilet Transfer: Maximal assistance, +2 for safety/equipment, Squat-pivot, BSC Toileting- Clothing Manipulation and Hygiene: Total assistance, Sit to/from stand Functional mobility during ADLs: Maximal assistance  Cognition: Cognition Overall Cognitive Status: No family/caregiver present to determine baseline cognitive functioning Arousal/Alertness: Awake/alert Orientation Level: Oriented X4 Attention: Sustained Sustained Attention: Appears intact(but pt describes onset of headache while trying to focus) Memory: Appears intact Awareness: Impaired Awareness Impairment: Intellectual impairment Problem Solving: Impaired Problem Solving Impairment: Verbal basic, Functional basic Safety/Judgment: Impaired Cognition Arousal/Alertness: Awake/alert Behavior During Therapy: Anxious, Flat affect Overall Cognitive Status: No family/caregiver present to determine baseline cognitive functioning Area of Impairment: Attention, Awareness, Problem solving Current Attention Level: Sustained Awareness: Intellectual Problem Solving: Difficulty sequencing, Requires verbal cues, Requires tactile cues General Comments: brother present upon PT arrival, however quickly excusing himself  Physical Exam: Blood pressure (!) 192/53, pulse Marland Kitchen)  52, temperature 97.8 F (36.6 C), temperature source Oral, resp. rate 18, SpO2 97 %. Physical Exam  Constitutional: She appears  well-developed. No distress.  HENT:  Head: Normocephalic.  Eyes: Pupils are equal, round, and reactive to light. EOM are normal.  Neck: Normal range of motion. No tracheal deviation present. No thyromegaly present.  Cardiovascular: Normal rate and normal heart sounds. Exam reveals no friction rub.  No murmur heard. Respiratory: Effort normal. No respiratory distress. She has no wheezes.  GI: Soft. She exhibits no distension. There is no tenderness.  Musculoskeletal: She exhibits no edema or deformity.  Neurological:  Patient is alert. Speech is a bit dysarthric. Follows commands. Fair awareness of deficits although she continues to request discharge to home if possible. Moves all 4's with fair coordination demonstrates slowed reaction time. Strength 3-4/5. Sensory exam normal.   Skin: She is not diaphoretic.  Psychiatric:  Sl flat, but cooperative    No results found for this or any previous visit (from the past 48 hour(s)). Dg Swallowing Func-speech Pathology  Result Date: 12/18/2017 Objective Swallowing Evaluation: Type of Study: MBS-Modified Barium Swallow Study  Patient Details Name: Cachet Mccutchen MRN: [id number] Date of Birth: 07-12-1940 Today's Date: 12/18/2017 Time: SLP Start Time (ACUTE ONLY): 1230 -SLP Stop Time (ACUTE ONLY): 1252 SLP Time Calculation (min) (ACUTE ONLY): 22 min Past Medical History: Past Medical History: Diagnosis Date . Anxiety  . Arthritis  . Diabetes mellitus without complication (HCC)  . Hypertension  . Sinus problem  . Thyroid disease  Past Surgical History: Past Surgical History: Procedure Laterality Date . BREAST SURGERY   . EYE SURGERY   HPI: 77 y.o. female with medical history significant of HTN, Anxiety, DM, Hypothyroidism, Renal artery stenosis, was brought to the ED by her brother due to worsening speech difficulty. MRI was positive for acute bilateral pontine ischemic strokes.  No data recorded Assessment / Plan / Recommendation CHL IP CLINICAL IMPRESSIONS  12/18/2017 Clinical Impression Patient presents with a moderate oropharyngeal dysphagia. Orally, patient with mildly delayed A-P transit of bolus, intermttently decreased bolus cohesion, and mild oral residuals with premature spillage over the base of the tongue. Following, delayed swallow initiation combined with decreased laryngeal closure results in deep penetration with eventual aspiration of viscosities thinner than pudding thick. Attempted use of chin tuck which was inconsistently effective. Should be noted however that patient with some difficulty with carryout of strategy. SLP wonders if strategy would have been effective with liquid consistencies if patient had been able to consistently utilize. Mild vallecular residuals remain post swallow with solids due to base of tongue weakness increasing risk of post swallow aspiration,  although airway generally better protected. Recommend initiation of a conservative diet, puree (dys 1) with pudding thick liquids with close SLP f/u.  SLP Visit Diagnosis Dysphagia, oropharyngeal phase (R13.12) Attention and concentration deficit following -- Frontal lobe and executive function deficit following -- Impact on safety and function Severe aspiration risk   CHL IP TREATMENT RECOMMENDATION 12/18/2017 Treatment Recommendations Therapy as outlined in treatment plan below   Prognosis 12/18/2017 Prognosis for Safe Diet Advancement Good Barriers to Reach Goals -- Barriers/Prognosis Comment -- CHL IP DIET RECOMMENDATION 12/18/2017 SLP Diet Recommendations Dysphagia 1 (Puree) solids;Pudding thick liquid Liquid Administration via Spoon Medication Administration Crushed with puree Compensations Small sips/bites;Slow rate Postural Changes Seated upright at 90 degrees   CHL IP OTHER RECOMMENDATIONS 12/18/2017 Recommended Consults -- Oral Care Recommendations Oral care BID Other Recommendations Order thickener from pharmacy;Prohibited food (jello, ice cream, thin soups);Remove  water pitcher    CHL IP FOLLOW UP RECOMMENDATIONS 12/18/2017 Follow up Recommendations Inpatient Rehab   CHL IP FREQUENCY AND DURATION 12/18/2017 Speech Therapy Frequency (ACUTE ONLY) min 3x week Treatment Duration 2 weeks      CHL IP ORAL PHASE 12/18/2017 Oral Phase Impaired Oral - Pudding Teaspoon -- Oral - Pudding Cup -- Oral - Honey Teaspoon Lingual/palatal residue;Delayed oral transit Oral - Honey Cup Lingual/palatal residue;Delayed oral transit Oral - Nectar Teaspoon Lingual/palatal residue;Delayed oral transit Oral - Nectar Cup Lingual/palatal residue;Delayed oral transit Oral - Nectar Straw -- Oral - Thin Teaspoon Lingual/palatal residue;Delayed oral transit Oral - Thin Cup Lingual/palatal residue;Delayed oral transit Oral - Thin Straw -- Oral - Puree Lingual/palatal residue;Delayed oral transit Oral - Mech Soft Lingual/palatal residue;Delayed oral transit Oral - Regular -- Oral - Multi-Consistency -- Oral - Pill -- Oral Phase - Comment --  CHL IP PHARYNGEAL PHASE 12/18/2017 Pharyngeal Phase Impaired Pharyngeal- Pudding Teaspoon -- Pharyngeal -- Pharyngeal- Pudding Cup -- Pharyngeal -- Pharyngeal- Honey Teaspoon Delayed swallow initiation-vallecula;Penetration/Aspiration during swallow;Reduced airway/laryngeal closure Pharyngeal Material enters airway, CONTACTS cords and not ejected out;Material enters airway, passes BELOW cords without attempt by patient to eject out (silent aspiration) Pharyngeal- Honey Cup Delayed swallow initiation-pyriform sinuses;Reduced airway/laryngeal closure;Penetration/Aspiration during swallow Pharyngeal Material enters airway, passes BELOW cords without attempt by patient to eject out (silent aspiration);Material enters airway, CONTACTS cords and not ejected out Pharyngeal- Nectar Teaspoon Delayed swallow initiation-vallecula;Reduced airway/laryngeal closure;Penetration/Aspiration during swallow Pharyngeal Material enters airway, CONTACTS cords and then ejected out;Material enters airway, passes  BELOW cords without attempt by patient to eject out (silent aspiration) Pharyngeal- Nectar Cup Delayed swallow initiation-pyriform sinuses;Reduced airway/laryngeal closure;Penetration/Aspiration during swallow Pharyngeal Material enters airway, CONTACTS cords and not ejected out;Material enters airway, passes BELOW cords without attempt by patient to eject out (silent aspiration) Pharyngeal- Nectar Straw -- Pharyngeal -- Pharyngeal- Thin Teaspoon Delayed swallow initiation-pyriform sinuses;Reduced airway/laryngeal closure;Penetration/Aspiration during swallow Pharyngeal Material enters airway, CONTACTS cords and not ejected out Pharyngeal- Thin Cup Delayed swallow initiation-pyriform sinuses;Reduced airway/laryngeal closure;Penetration/Aspiration during swallow Pharyngeal Material enters airway, passes BELOW cords and not ejected out despite cough attempt by patient Pharyngeal- Thin Straw -- Pharyngeal -- Pharyngeal- Puree Delayed swallow initiation-vallecula;Reduced tongue base retraction;Pharyngeal residue - valleculae Pharyngeal -- Pharyngeal- Mechanical Soft Delayed swallow initiation-vallecula;Reduced tongue base retraction;Pharyngeal residue - valleculae Pharyngeal -- Pharyngeal- Regular -- Pharyngeal -- Pharyngeal- Multi-consistency -- Pharyngeal -- Pharyngeal- Pill -- Pharyngeal -- Pharyngeal Comment --  CHL IP CERVICAL ESOPHAGEAL PHASE 12/18/2017 Cervical Esophageal Phase WFL Pudding Teaspoon -- Pudding Cup -- Honey Teaspoon -- Honey Cup -- Nectar Teaspoon -- Nectar Cup -- Nectar Straw -- Thin Teaspoon -- Thin Cup -- Thin Straw -- Puree -- Mechanical Soft -- Regular -- Multi-consistency -- Pill -- Cervical Esophageal Comment -- Ferdinand Lango MA, CCC-SLP McCoy Leah Meryl 12/18/2017, 1:13 PM                  Medical Problem List and Plan: 1.  Decreased functional mobility with dysarthria/dysphagia secondary to bilateral anterior pontine nonhemorrhagic infarction  -admit to inpatient rehab 2.  DVT  Prophylaxis/Anticoagulation: subcutaneous Lovenox. Monitor for any bleeding episodes 3. Pain Management: Tylenol as needed 4. Mood: Ativan 1 mg 3 times a day as needed 5. Neuropsych: This patient is capable of making decisions on her own behalf. 6. Skin/Wound Care: routine skin checks 7. Fluids/Electrolytes/Nutrition: Encourage PO intake.   -Routine in and out's with follow-up chemistries 8.Dysphagia. Dysphagia #1 pudding thick liquids. Monitor hydration. Follow-up speech therapy 9.PermissiveHypertension.Tenormin 25 mg daily. Monitor with  increased mobility 10.Hypothyroidism. Synthroid 11. Hyperlipidemia. Zetia 12.Diet-controlled diabetes mellitus. Hemoglobin A1c 6.5. CBGs discontinued  Post Admission Physician Evaluation: 1. Functional deficits secondary  to bilateral pontine infarcts. 2. Patient is admitted to receive collaborative, interdisciplinary care between the physiatrist, rehab nursing staff, and therapy team. 3. Patient's level of medical complexity and substantial therapy needs in context of that medical necessity cannot be provided at a lesser intensity of care such as a SNF. 4. Patient has experienced substantial functional loss from his/her baseline which was documented above under the "Functional History" and "Functional Status" headings.  Judging by the patient's diagnosis, physical exam, and functional history, the patient has potential for functional progress which will result in measurable gains while on inpatient rehab.  These gains will be of substantial and practical use upon discharge  in facilitating mobility and self-care at the household level. 5. Physiatrist will provide 24 hour management of medical needs as well as oversight of the therapy plan/treatment and provide guidance as appropriate regarding the interaction of the two. 6. The Preadmission Screening has been reviewed and patient status is unchanged unless otherwise stated above. 7. 24 hour rehab nursing will  assist with bladder management, bowel management, safety, skin/wound care, disease management, medication administration, pain management and patient education  and help integrate therapy concepts, techniques,education, etc. 8. PT will assess and treat for/with: Lower extremity strength, range of motion, stamina, balance, functional mobility, safety, adaptive techniques and equipment, NMR, family education .   Goals are: supervision to min assist. 9. OT will assess and treat for/with: ADL's, functional mobility, safety, upper extremity strength, adaptive techniques and equipment, NMR, family education.   Goals are: supervision to min assist. Therapy may proceed with showering this patient. 10. SLP will assess and treat for/with: cognition, communication, swallowing.  Goals are: mod I. 11. Case Management and Social Worker will assess and treat for psychological issues and discharge planning. 12. Team conference will be held weekly to assess progress toward goals and to determine barriers to discharge. 13. Patient will receive at least 3 hours of therapy per day at least 5 days per week. 14. ELOS: 15-19 days       15. Prognosis:  excellent   I have personally performed a face to face diagnostic evaluation of this patient and formulated the key components of the plan.  Additionally, I have personally reviewed laboratory data, imaging studies, as well as relevant notes and concur with the physician assistant's documentation above.  Ranelle Oyster, MD, FAAPMR    Mcarthur Rossetti Angiulli, PA-C 12/20/2017

## 2017-12-20 NOTE — Progress Notes (Signed)
Patient received on unit at 1445 alert and oriented x 3-4. Oriented to room and call bell system. Patient verbalized understanding of admission process. Continue with plan of care.  Brandy Flowers

## 2017-12-20 NOTE — Consult Note (Signed)
[facility] CM Primary Care Navigator  12/20/2017  Tyna Huertas [birth year] [id number]   Met with patient and brother (Chip)atthe bedsideto identify possible discharge needs.  Brother reports that patient complained of dizziness and noted to have slurred speech that had led to this admission. (acute bilateral pontine ischemic strokes, HTN, left renal artery stenosis, anxiety)   Patient endorses Dr. Ernestene Kiel with [facility] asher primary care provider.   [facility] [place] [facility].  Patientverbalized that she has beenmanaging her own medications at home with use of "pill box" system filled once a week.  Patientreports that she has been driving prior to admission but her brother or neighbor/ friend Jenny Reichmann) will provide transportation to her doctors' appointments after discharge.  Brother states that patient lives alone and independently prior to this admission. Her friends/ neighbors Roselie Awkward, John and [place]) as well as her brother will be providing needed assistance at home as stated.  Anticipated plan for discharge [place] per therapy recommendation.  Patientand brother voiced understandingto callprimary careprovider'soffice when she returns backhomefor a post discharge follow-upvisit within1- 2 weeksor sooner if needs arise.Patient letter (with PCP's contact number) was provided as areminder.   Explained topatientand brother about [facility] CM services available for health management andresourcesat homeand she seemed interested with it.  Patient voiced understanding todiscuss with primary care provider onher next visit, aboutfurther needsand assistanceinmanaging herhealth conditions (mainly HTN, DM)once shegetsback home. Patientand brother expressedunderstandingto seekreferral from primary care provider to [facility] care management  asdeemed necessary and appropriatefor anyservicesin the near future- whenshereturns tohome.   [facility] care management information was provided for future needs that she may have.  Primary care provider's office is listed as providing transition of care (TOC) follow-up.  Noted order for EMMI Stroke calls already in place after discharge.   For additional questions please contact:  Edwena Felty A. Aland Chestnutt, BSN, RN-BC [facility] PRIMARY CARE Navigator Cell: [phone]

## 2017-12-20 NOTE — Progress Notes (Signed)
Trish Mage, RN  Rehab Admission Coordinator  Physical Medicine and Rehabilitation  PMR Pre-admission  Signed  Date of Service:  12/20/2017 11:32 AM       Related encounter: ED to Hosp-Admission (Discharged) from 12/17/2017 in [facility]      Signed         Show:Clear all [x] Manual[x] Template[x] Copied  Added by: [x] Trish Mage, RN  [] Hover for details PMR Admission Coordinator Pre-Admission Assessment  Patient: Brandy Flowers is an 77 y.o., female MRN: [id number] DOB: Sep 24, 1940 Height:   Weight:                                                                                                                                                    Insurance Information HMO: No   PPO:       PCP:       IPA:       80/20:       OTHER:   PRIMARY: Medicare A and B      Policy#: [id number]      Subscriber: patient CM Name:        Phone#:       Fax#:   Pre-Cert#:        Employer:   Benefits:  Phone #:       Name: Checked in passport one source Eff. Date: 11/16/06     Deduct:  $1364      Out of Pocket Max: None      Life Max: N/A CIR: 100%      SNF: 100 days Outpatient: 80%     Co-Pay: 20% Home Health: 100%      Co-Pay: none DME: 80%     Co-Pay: 20% Providers: patient's choice  SECONDARY: BCBS federal emp PPO      Policy#: [id number]      Subscriber:  patient CM Name:        Phone#:       Fax#:   Pre-Cert#:        Employer:   Benefits:  Phone #: [phone]     Name:   Eff. Date:       Deduct:        Out of Pocket Max:        Life Max:   CIR:        SNF:   Outpatient:       Co-Pay:   Home Health:        Co-Pay:   DME:       Co-Pay:    Medicaid Application Date:        Case Manager:   Disability Application Date:        Case Worker:    Emergency Chief Operating Officer Information    Name Relation Home Work Mobile  Rochele Raring [phone]       Current Medical History  Patient Admitting Diagnosis: Bilateral  anterior pontine nonhemorrhagic infarction   History of Present Illness: A 77 year old right handed female history of hypertension, renal artery stenosis and diabetes mellitus. Per chart review and patient, patient lives alone. Independent prior to admission. One level home with 2 steps to entry. She has a brother who is a Engineer, civil (consulting) and states she will be living with him post discharge. Patient presented 12/17/2017 with slurred speech and altered mental status. Cranial CT scan reviewed, unremarkable for acute intracranial process. 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 Patient did not receive TPA. CT angiogram of head and neck with no significant carotid or vertebral artery stenosis. Negative for emergent large vessel occlusion. Echocardiogram with ejection fraction of 70% grade 1 diastolic dysfunction. No wall motion abnormalities. Maintained on aspirin and Plavix 3 months and Plavix alone. Subcutaneous Lovenox for DVT prophylaxis.Dysphagia #1 pudding thick liquid diet. Therapy evaluations completed with recommendations of physical medicine rehabilitation consult. Patient to be admitted for a comprehensive inpatient rehabilitation program.   Complete NIHSS TOTAL: 3  Past Medical History      Past Medical History:  Diagnosis Date  . Anxiety   . Arthritis   . Diabetes mellitus without complication (HCC)   . Hypertension   . Sinus problem   . Thyroid disease     Family History  family history includes Diabetes in her mother; Heart attack in her father; Heart disease in her brother, father, and mother; Hyperlipidemia in her mother; Hypertension in her brother and mother; Stroke in her mother.  Prior Rehab/Hospitalizations:  Has the patient had major surgery during 100 days prior to admission? No  Current Medications   Current Facility-Administered Medications:  .  0.9 %  sodium chloride infusion, , Intravenous, Continuous, Zannie Cove, MD,  Last Rate: 10 mL/hr at 12/18/17 0815 .  acetaminophen (TYLENOL) tablet 650 mg, 650 mg, Oral, Q4H PRN, 650 mg at 12/18/17 1525 **OR** acetaminophen (TYLENOL) solution 650 mg, 650 mg, Per Tube, Q4H PRN **OR** acetaminophen (TYLENOL) suppository 650 mg, 650 mg, Rectal, Q4H PRN, Zannie Cove, MD .  aspirin suppository 300 mg, 300 mg, Rectal, Daily **OR** aspirin tablet 325 mg, 325 mg, Oral, Daily, Zannie Cove, MD, 325 mg at 12/20/17 1014 .  atenolol (TENORMIN) tablet 25 mg, 25 mg, Oral, Daily, Zannie Cove, MD, 25 mg at 12/20/17 1016 .  clopidogrel (PLAVIX) tablet 75 mg, 75 mg, Oral, Daily, Marvel Plan, MD, 75 mg at 12/19/17 1243 .  enoxaparin (LOVENOX) injection 40 mg, 40 mg, Subcutaneous, Q24H, Zannie Cove, MD, 40 mg at 12/19/17 2328 .  ezetimibe (ZETIA) tablet 10 mg, 10 mg, Oral, Daily, Marvel Plan, MD, 10 mg at 12/20/17 1017 .  LORazepam (ATIVAN) tablet 1 mg, 1 mg, Oral, TID PRN, Zannie Cove, MD, 1 mg at 12/20/17 1015 .  MEDLINE mouth rinse, 15 mL, Mouth Rinse, q12n4p, Zannie Cove, MD, 15 mL at 12/19/17 1754 .  RESOURCE THICKENUP CLEAR, , Oral, PRN, Zannie Cove, MD .  senna-docusate (Senokot-S) tablet 1 tablet, 1 tablet, Oral, QHS PRN, Zannie Cove, MD .  Synthroid tablet (no blue dye), 100 mcg, Oral, Q24H, Zannie Cove, MD, 100 mcg at 12/20/17 0981  Patients Current Diet:     Diet Order                  DIET - DYS 1 Room service appropriate? Yes; Fluid consistency: Pudding Thick  Diet effective now  Precautions / Restrictions Precautions Precautions: Fall Restrictions Weight Bearing Restrictions: No   Has the patient had 2 or more falls or a fall with injury in the past year?Yes.  Patient reports one fall this past year in the snow with an ankle injury.  Prior Activity Level Community (5-7x/wk): Went out about 3 X a week, was driving.  Home Assistive Devices / Equipment Home Assistive Devices/Equipment: None Home  Equipment: None  Prior Device Use: Indicate devices/aids used by the patient prior to current illness, exacerbation or injury? None  Prior Functional Level Prior Function Level of Independence: Independent Comments: Pt was fully independent PTA.    Self Care: Did the patient need help bathing, dressing, using the toilet or eating?  Independent  Indoor Mobility: Did the patient need assistance with walking from room to room (with or without device)? Independent  Stairs: Did the patient need assistance with internal or external stairs (with or without device)? Independent  Functional Cognition: Did the patient need help planning regular tasks such as shopping or remembering to take medications? Independent  Current Functional Level Cognition  Arousal/Alertness: Awake/alert Overall Cognitive Status: No family/caregiver present to determine baseline cognitive functioning Current Attention Level: Sustained Orientation Level: Oriented X4 General Comments: brother present upon PT arrival, however quickly excusing himself Attention: Sustained Sustained Attention: Appears intact(but pt describes onset of headache while trying to focus) Memory: Appears intact Awareness: Impaired Awareness Impairment: Intellectual impairment Problem Solving: Impaired Problem Solving Impairment: Verbal basic, Functional basic Safety/Judgment: Impaired    Extremity Assessment (includes Sensation/Coordination)  Upper Extremity Assessment: Defer to OT evaluation 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 RUE Coordination: decreased gross motor LUE Deficits / Details: grossly 4/5   Lower Extremity Assessment: Generalized weakness(BLE motor 3-/5)    ADLs  Overall ADL's : Needs assistance/impaired Eating/Feeding: NPO, Sitting Grooming: Wash/dry hands, Wash/dry face, Oral care, Brushing hair, Minimal assistance, Sitting Upper Body Bathing: Minimal assistance, Sitting Lower Body Bathing: Maximal  assistance, Sit to/from stand Upper Body Dressing : Moderate assistance, Sitting Lower Body Dressing: Total assistance, Sit to/from stand Lower Body Dressing Details (indicate cue type and reason): Pt states she can doff her socks, but when asked to do so, she was unable as she became dizzy when she attempted to lean forward  Toilet Transfer: Maximal assistance, +2 for safety/equipment, Squat-pivot, BSC Toileting- Clothing Manipulation and Hygiene: Total assistance, Sit to/from stand Functional mobility during ADLs: Maximal assistance    Mobility  Overal bed mobility: Needs Assistance Bed Mobility: Supine to Sit Sidelying to sit: Min assist Supine to sit: Min assist Sit to supine: Min assist General bed mobility comments: Min A for LE and trunk management; use of bed pad to right hips    Transfers  Overall transfer level: Needs assistance Equipment used: Rolling walker (2 wheeled) Transfers: Sit to/from Stand Sit to Stand: Mod assist, Min assist, +2 physical assistance General transfer comment: Min-Mod A +2 to pwoer up at bedside; poor hip/knee extension upon standing; requires cueing for hand placement    Ambulation / Gait / Stairs / Wheelchair Mobility  Ambulation/Gait Ambulation/Gait assistance: Min assist, +2 physical assistance Gait Distance (Feet): 6 Feet Assistive device: Rolling walker (2 wheeled) Gait Pattern/deviations: Step-to pattern, Step-through pattern, Decreased stride length, Shuffle, Trunk flexed General Gait Details: poor hip/knee extension; requires cueing throughout for safety; quickly sitting as pateitn becomed tearful and frustrated Gait velocity: decreased    Posture / Balance Balance Overall balance assessment: Needs assistance Sitting-balance support: No upper extremity supported, Feet supported Sitting  balance-Leahy Scale: Poor Standing balance support: Bilateral upper extremity supported, During functional activity Standing balance-Leahy Scale:  Poor    Special needs/care consideration BiPAP/CPAP No CPM No Continuous Drip IV No Dialysis No        Life Vest No Oxygen No Special Bed No Trach Size No Wound Vac (area) No       Skin No                             Bowel mgmt: Last BM 12/16/17 Bladder mgmt: Catheterizations due to unable to start voiding Diabetic mgmt Yes, controlled with diet, no medications.    Previous Home Environment Living Arrangements: Alone  Lives With: Alone Available Help at Discharge: Family, Friend(s), Available 24 hours/day Home Layout: One level Home Access: Stairs to enter Entrance Stairs-Rails: Right Entrance Stairs-Number of Steps: 2 Bathroom Shower/Tub: Engineer, manufacturing systems: Standard Home Care Services: No Additional Comments: lives with her dog.  She is unable to tell me who will stay with her, but insists that she will have 24 hour physical assist, and that someone is taking care of that   Discharge Living Setting Plans for Discharge Living Setting: Alone, House Type of Home at Discharge: House Discharge Home Layout: One level Discharge Home Access: Stairs to enter Entrance Stairs-Number of Steps: 3 Discharge Bathroom Shower/Tub: Tub/shower unit, Door Discharge Bathroom Toilet: Standard Does the patient have any problems obtaining your medications?: No  Social/Family/Support Systems Patient Roles: Other (Comment)(Has a brother and friends.) Contact Information: Trisha Mangle - brother - "Chip" Anticipated Caregiver: Brother, friends Debroah Loop and Rose Anticipated [employer] Information: Chip - brother - [phone] Ability/Limitations of Caregiver: Brother works as a Engineer, civil (consulting) in the cath lab her at [employer].  Rose a friend lives down the street.  Also has friend Carlus Pavlov who can stay with patient some. Caregiver Availability: Other (Comment)(Patient is aware that she will likely need someone to stay ) Discharge Plan Discussed with Primary Caregiver:  Yes Is Caregiver In Agreement with Plan?: Yes Does Caregiver/Family have Issues with Lodging/Transportation while Pt is in Rehab?: No  Goals/Additional Needs Patient/Family Goal for Rehab: PT/OT supervision to min assist goals, SLP mod I goals Expected length of stay: 15-19 days Cultural Considerations: None Dietary Needs: Dys 1, pudding thick liquids Equipment Needs: TBD Pt/Family Agrees to Admission and willing to participate: Yes Program Orientation Provided & Reviewed with Pt/Caregiver Including Roles  & Responsibilities: Yes  Decrease burden of Care through IP rehab admission: N/A  Possible need for SNF placement upon discharge: Not planned  Patient Condition: This patient's condition remains as documented in the consult dated 12/19/17, in which the Rehabilitation Physician determined and documented that the patient's condition is appropriate for intensive rehabilitative care in an inpatient rehabilitation facility pending patient is now agreeable to inpatient rehab admission. These areas have been addressed. Patient will have assistance from her brother and 2 friends who can share staying with patient at discharge. Will admit to inpatient rehab today.  Preadmission Screen Completed By:  Trish Mage, 12/20/2017 11:51 AM ______________________________________________________________________   Discussed status with Dr. Riley Kill on 12/20/17 at 1151 and received telephone approval for admission today.  Admission Coordinator:  Trish Mage, time 1151/Date 12/20/17           Cosigned by: Ranelle Oyster, MD at 12/20/2017 12:02 PM  Revision History

## 2017-12-20 NOTE — Care Management Note (Signed)
Case Management Note  Patient Details  Name: Kashira Behunin MRN: [id number] Date of Birth: Dec 25, 1940  Subjective/Objective:       CVA. Hx of HTN,DM. From home alone. States PTA independent with ADL's , no DME usage. States has  supportive family/ friends.           Trisha Mangle (Brother)     [phone]      PCP: Dr. Herma Carson  Action/Plan: Transition to CIR today.  Expected Discharge Date:  12/20/17               Expected Discharge Plan:  IP Rehab Facility  In-House Referral:     Discharge planning Services  CM Consult  Post Acute Care Choice:  NA Choice offered to:  NA  DME Arranged:  N/A DME Agency:  NA  HH Arranged:  NA HH Agency:  NA  Status of Service:  Completed, signed off  If discussed at Long Length of Stay Meetings, dates discussed:    Additional Comments:  Epifanio Lesches, RN 12/20/2017, 11:05 AM

## 2017-12-20 NOTE — Care Management Important Message (Signed)
Important Message  Patient Details  Name: Brandy Flowers MRN: [id number] Date of Birth: [birth year]   Medicare Important Message Given:  Yes    Khristin Keleher Stefan Church 12/20/2017, 3:44 PM

## 2017-12-20 NOTE — H&P (Signed)
Physical Medicine and Rehabilitation Admission H&P        Chief Complaint  Patient presents with  . Aphasia  : HPI: Brandy Flowers is a 77 year old right handed female history of hypertension, renal artery stenosis and diabetes mellitus. Per chart review and patient, patient lives alone. Independent prior to admission. One level home with 2 steps to entry. She has a brother who is a Engineer, civil (consulting) and states she will be living with him post discharge. Patient presented 12/17/2017 with slurred speech and altered mental status. Cranial CT scan reviewed, unremarkable for acute intracranial process. 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 Patient did not receive TPA. CT angiogram of head and neck with no significant carotid or vertebral artery stenosis. Negative for emergent large vessel occlusion. Echocardiogram with ejection fraction of 70% grade 1 diastolic dysfunction. No wall motion abnormalities. Maintained on aspirin and Plavix 3 months and Plavix alone. Subcutaneous Lovenox for DVT prophylaxis.Dysphagia #1 pudding thick liquid diet. Therapy evaluations completed with recommendations of physical medicine rehabilitation consult. Patient was admitted for a comprehensive rehabilitation program.   Review of Systems  Constitutional: Negative for chills and fever.  HENT: Negative for hearing loss.   Eyes: Negative for blurred vision and double vision.  Respiratory: Negative for cough and shortness of breath.   Cardiovascular: Positive for leg swelling. Negative for chest pain and palpitations.  Gastrointestinal: Positive for constipation.  Musculoskeletal: Positive for joint pain and myalgias.  Skin: Negative for rash.  Neurological: Positive for dizziness, speech change, focal weakness and headaches.  Psychiatric/Behavioral:       Anxiety  All other systems reviewed and are negative.       Past Medical History:  Diagnosis Date  . Anxiety    .  Arthritis    . Diabetes mellitus without complication (HCC)    . Hypertension    . Sinus problem    . Thyroid disease           Past Surgical History:  Procedure Laterality Date  . BREAST SURGERY      . EYE SURGERY             Family History  Problem Relation Age of Onset  . Stroke Mother    . Diabetes Mother    . Heart disease Mother          Before age 23  . Hypertension Mother    . Hyperlipidemia Mother    . Heart disease Father    . Heart attack Father    . Heart disease Brother          After age 31- CABG  . Hypertension Brother      Social History:  reports that she has never smoked. She has never used smokeless tobacco. She reports that she does not drink alcohol or use drugs. Allergies:      Allergies  Allergen Reactions  . Blue Dyes (Parenteral) Shortness Of Breath and Rash  . Sulfa Antibiotics Anaphylaxis  . Ace Inhibitors Nausea And Vomiting  . Norvasc [Amlodipine] Nausea And Vomiting  . Calcium Channel Blockers Diarrhea          Medications Prior to Admission  Medication Sig Dispense Refill  . acetaminophen-codeine (TYLENOL #3) 300-30 MG per tablet Take 0.5 tablets by mouth daily as needed (severe headache).       Marland Kitchen aspirin EC 325 MG tablet Take 325-650 mg by mouth 3 (three) times daily as needed (headache).      Marland Kitchen  atenolol (TENORMIN) 50 MG tablet Take 100 mg by mouth daily.       Marland Kitchen levothyroxine (SYNTHROID, LEVOTHROID) 50 MCG tablet Take 100 mcg by mouth daily before breakfast.       . lidocaine (LIDODERM) 5 % Place 1 patch onto the skin daily as needed (pain).       . LORazepam (ATIVAN) 1 MG tablet Take 1 mg by mouth See admin instructions. Take 1 tablet (1 mg) by mouth twice daily, may take a 3rd tablet (1 mg) during the day as needed for SBP >170, may also take a 4th tablet during the day as needed for anxiety.   5  . meloxicam (MOBIC) 15 MG tablet Take 15 mg by mouth daily as needed (leg pain).       . nitroGLYCERIN (NITROSTAT) 0.4 MG SL tablet Place  0.4 mg under the tongue daily as needed (SBP >180).    99      Drug Regimen Review Drug regimen was reviewed and remains appropriate with no significant issues identified   Home: Home Living Family/patient expects to be discharged to:: Private residence Living Arrangements: Alone Available Help at Discharge: Family, Friend(s), Available 24 hours/day Home Access: Stairs to enter Secretary/administrator of Steps: 2 Entrance Stairs-Rails: Right Home Layout: One level Bathroom Shower/Tub: Engineer, manufacturing systems: Standard Home Equipment: None Additional Comments: lives with her dog.  She is unable to tell me who will stay with her, but insists that she will have 24 hour physical assist, and that someone is taking care of that   Lives With: Alone   Functional History: Prior Function Level of Independence: Independent Comments: Pt was fully independent PTA.     Functional Status:  Mobility: Bed Mobility Overal bed mobility: Needs Assistance Bed Mobility: Supine to Sit Sidelying to sit: Min assist Supine to sit: Min assist Sit to supine: Min assist General bed mobility comments: Min A for LE and trunk management; use of bed pad to right hips Transfers Overall transfer level: Needs assistance Equipment used: Rolling walker (2 wheeled) Transfers: Sit to/from Stand Sit to Stand: Mod assist, Min assist, +2 physical assistance General transfer comment: Min-Mod A +2 to pwoer up at bedside; poor hip/knee extension upon standing; requires cueing for hand placement Ambulation/Gait Ambulation/Gait assistance: Min assist, +2 physical assistance Gait Distance (Feet): 6 Feet Assistive device: Rolling walker (2 wheeled) Gait Pattern/deviations: Step-to pattern, Step-through pattern, Decreased stride length, Shuffle, Trunk flexed General Gait Details: poor hip/knee extension; requires cueing throughout for safety; quickly sitting as pateitn becomed tearful and frustrated Gait  velocity: decreased   ADL: ADL Overall ADL's : Needs assistance/impaired Eating/Feeding: NPO, Sitting Grooming: Wash/dry hands, Wash/dry face, Oral care, Brushing hair, Minimal assistance, Sitting Upper Body Bathing: Minimal assistance, Sitting Lower Body Bathing: Maximal assistance, Sit to/from stand Upper Body Dressing : Moderate assistance, Sitting Lower Body Dressing: Total assistance, Sit to/from stand Lower Body Dressing Details (indicate cue type and reason): Pt states she can doff her socks, but when asked to do so, she was unable as she became dizzy when she attempted to lean forward  Toilet Transfer: Maximal assistance, +2 for safety/equipment, Squat-pivot, BSC Toileting- Clothing Manipulation and Hygiene: Total assistance, Sit to/from stand Functional mobility during ADLs: Maximal assistance   Cognition: Cognition Overall Cognitive Status: No family/caregiver present to determine baseline cognitive functioning Arousal/Alertness: Awake/alert Orientation Level: Oriented X4 Attention: Sustained Sustained Attention: Appears intact(but pt describes onset of headache while trying to focus) Memory: Appears intact Awareness: Impaired Awareness Impairment: Intellectual  impairment Problem Solving: Impaired Problem Solving Impairment: Verbal basic, Functional basic Safety/Judgment: Impaired Cognition Arousal/Alertness: Awake/alert Behavior During Therapy: Anxious, Flat affect Overall Cognitive Status: No family/caregiver present to determine baseline cognitive functioning Area of Impairment: Attention, Awareness, Problem solving Current Attention Level: Sustained Awareness: Intellectual Problem Solving: Difficulty sequencing, Requires verbal cues, Requires tactile cues General Comments: brother present upon PT arrival, however quickly excusing himself   Physical Exam: Blood pressure (!) 192/53, pulse (!) 52, temperature 97.8 F (36.6 C), temperature source Oral, resp. rate  18, SpO2 97 %. Physical Exam  Constitutional: She appears well-developed. No distress.  HENT:  Head: Normocephalic.  Eyes: Pupils are equal, round, and reactive to light. EOM are normal.  Neck: Normal range of motion. No tracheal deviation present. No thyromegaly present.  Cardiovascular: Normal rate and normal heart sounds. Exam reveals no friction rub.  No murmur heard. Respiratory: Effort normal. No respiratory distress. She has no wheezes.  GI: Soft. She exhibits no distension. There is no tenderness.  Musculoskeletal: She exhibits no edema or deformity.  Neurological:  Patient is alert. Speech is a bit dysarthric. Follows commands. Fair awareness of deficits although she continues to request discharge to home if possible. Moves all 4's with fair coordination demonstrates slowed reaction time. Strength 3-4/5. Sensory exam normal.   Skin: She is not diaphoretic.  Psychiatric:  Sl flat, but cooperative      Lab Results Last 48 Hours  No results found for this or any previous visit (from the past 48 hour(s)).    Imaging Results (Last 48 hours)  Dg Swallowing Func-speech Pathology   Result Date: 12/18/2017 Objective Swallowing Evaluation: Type of Study: MBS-Modified Barium Swallow Study  Patient Details Name: Yue Flanigan MRN: [id number] Date of Birth: [birth year] Today's Date: 12/18/2017 Time: SLP Start Time (ACUTE ONLY): 1230 -SLP Stop Time (ACUTE ONLY): 1252 SLP Time Calculation (min) (ACUTE ONLY): 22 min Past Medical History: Past Medical History: Diagnosis Date . Anxiety  . Arthritis  . Diabetes mellitus without complication (HCC)  . Hypertension  . Sinus problem  . Thyroid disease  Past Surgical History: Past Surgical History: Procedure Laterality Date . BREAST SURGERY   . EYE SURGERY   HPI: 77 y.o. female with medical history significant of HTN, Anxiety, DM, Hypothyroidism, Renal artery stenosis, was brought to the ED by her brother due to worsening speech difficulty. MRI was positive  for acute bilateral pontine ischemic strokes.  No data recorded Assessment / Plan / Recommendation CH[facility] IP CLINICAL IMPRESSIONS 12/18/2017 Clinical Impression Patient presents with a moderate oropharyngeal dysphagia. Orally, patient with mildly delayed A-P transit of bolus, intermttently decreased bolus cohesion, and mild oral residuals with premature spillage over the base of the tongue. Following, delayed swallow initiation combined with decreased laryngeal closure results in deep penetration with eventual aspiration of viscosities thinner than pudding thick. Attempted use of chin tuck which was inconsistently effective. Should be noted however that patient with some difficulty with carryout of strategy. SLP wonders if strategy would have been effective with liquid consistencies if patient had been able to consistently utilize. Mild vallecular residuals remain post swallow with solids due to base of tongue weakness increasing risk of post swallow aspiration,  although airway generally better protected. Recommend initiation of a conservative diet, puree (dys 1) with pudding thick liquids with close SLP f/u.  SLP Visit Diagnosis Dysphagia, oropharyngeal phase (R13.12) Attention and concentration deficit following -- Frontal lobe and executive function deficit following -- Impact on safety and function Severe aspiration risk  [facility] IP TREATMENT RECOMMENDATION 12/18/2017 Treatment Recommendations Therapy as outlined in treatment plan below   Prognosis 12/18/2017 Prognosis for Safe Diet Advancement Good Barriers to Reach Goals -- Barriers/Prognosis Comment -- [facility] IP DIET RECOMMENDATION 12/18/2017 SLP Diet Recommendations Dysphagia 1 (Puree) solids;Pudding thick liquid Liquid Administration via Spoon Medication Administration Crushed with puree Compensations Small sips/bites;Slow rate Postural Changes Seated upright at 90 degrees   CH[facility] IP OTHER RECOMMENDATIONS 12/18/2017 Recommended Consults -- Oral Care Recommendations Oral  care BID Other Recommendations Order thickener from pharmacy;Prohibited food (jello, ice cream, thin soups);Remove water pitcher   [facility] IP FOLLOW UP RECOMMENDATIONS 12/18/2017 Follow up Recommendations Inpatient Rehab   [facility] IP FREQUENCY AND DURATION 12/18/2017 Speech Therapy Frequency (ACUTE ONLY) min 3x week Treatment Duration 2 weeks      CHL IP ORAL PHASE 12/18/2017 Oral Phase Impaired Oral - Pudding Teaspoon -- Oral - Pudding Cup -- Oral - Honey Teaspoon Lingual/palatal residue;Delayed oral transit Oral - Honey Cup Lingual/palatal residue;Delayed oral transit Oral - Nectar Teaspoon Lingual/palatal residue;Delayed oral transit Oral - Nectar Cup Lingual/palatal residue;Delayed oral transit Oral - Nectar Straw -- Oral - Thin Teaspoon Lingual/palatal residue;Delayed oral transit Oral - Thin Cup Lingual/palatal residue;Delayed oral transit Oral - Thin Straw -- Oral - Puree Lingual/palatal residue;Delayed oral transit Oral - Mech Soft Lingual/palatal residue;Delayed oral transit Oral - Regular -- Oral - Multi-Consistency -- Oral - Pill -- Oral Phase - Comment --  CH[facility] IP PHARYNGEAL PHASE 12/18/2017 Pharyngeal Phase Impaired Pharyngeal- Pudding Teaspoon -- Pharyngeal -- Pharyngeal- Pudding Cup -- Pharyngeal -- Pharyngeal- Honey Teaspoon Delayed swallow initiation-vallecula;Penetration/Aspiration during swallow;Reduced airway/laryngeal closure Pharyngeal Material enters airway, CONTACTS cords and not ejected out;Material enters airway, passes BELOW cords without attempt by patient to eject out (silent aspiration) Pharyngeal- Honey Cup Delayed swallow initiation-pyriform sinuses;Reduced airway/laryngeal closure;Penetration/Aspiration during swallow Pharyngeal Material enters airway, passes BELOW cords without attempt by patient to eject out (silent aspiration);Material enters airway, CONTACTS cords and not ejected out Pharyngeal- Nectar Teaspoon Delayed swallow initiation-vallecula;Reduced airway/laryngeal  closure;Penetration/Aspiration during swallow Pharyngeal Material enters airway, CONTACTS cords and then ejected out;Material enters airway, passes BELOW cords without attempt by patient to eject out (silent aspiration) Pharyngeal- Nectar Cup Delayed swallow initiation-pyriform sinuses;Reduced airway/laryngeal closure;Penetration/Aspiration during swallow Pharyngeal Material enters airway, CONTACTS cords and not ejected out;Material enters airway, passes BELOW cords without attempt by patient to eject out (silent aspiration) Pharyngeal- Nectar Straw -- Pharyngeal -- Pharyngeal- Thin Teaspoon Delayed swallow initiation-pyriform sinuses;Reduced airway/laryngeal closure;Penetration/Aspiration during swallow Pharyngeal Material enters airway, CONTACTS cords and not ejected out Pharyngeal- Thin Cup Delayed swallow initiation-pyriform sinuses;Reduced airway/laryngeal closure;Penetration/Aspiration during swallow Pharyngeal Material enters airway, passes BELOW cords and not ejected out despite cough attempt by patient Pharyngeal- Thin Straw -- Pharyngeal -- Pharyngeal- Puree Delayed swallow initiation-vallecula;Reduced tongue base retraction;Pharyngeal residue - valleculae Pharyngeal -- Pharyngeal- Mechanical Soft Delayed swallow initiation-vallecula;Reduced tongue base retraction;Pharyngeal residue - valleculae Pharyngeal -- Pharyngeal- Regular -- Pharyngeal -- Pharyngeal- Multi-consistency -- Pharyngeal -- Pharyngeal- Pill -- Pharyngeal -- Pharyngeal Comment --  CHL IP CERVICAL ESOPHAGEAL PHASE 12/18/2017 Cervical Esophageal Phase WFL Pudding Teaspoon -- Pudding Cup -- Honey Teaspoon -- Honey Cup -- Nectar Teaspoon -- Nectar Cup -- Nectar Straw -- Thin Teaspoon -- Thin Cup -- Thin Straw -- Puree -- Mechanical Soft -- Regular -- Multi-consistency -- Pill -- Cervical Esophageal Comment -- Ferdinand Lango MA, CCC-SLP McCoy Leah Meryl 12/18/2017, 1:13 PM                        Medical Problem List  and Plan: 1.  Decreased  functional mobility with dysarthria/dysphagia secondary to bilateral anterior pontine nonhemorrhagic infarction             -admit to inpatient rehab 2.  DVT Prophylaxis/Anticoagulation: subcutaneous Lovenox. Monitor for any bleeding episodes 3. Pain Management: Tylenol as needed 4. Mood: Ativan 1 mg 3 times a day as needed 5. Neuropsych: This patient is capable of making decisions on her own behalf. 6. Skin/Wound Care: routine skin checks 7. Fluids/Electrolytes/Nutrition: Encourage PO intake.              -Routine in and out's with follow-up chemistries 8.Dysphagia. Dysphagia #1 pudding thick liquids. Monitor hydration. Follow-up speech therapy 9.PermissiveHypertension.Tenormin 25 mg daily. Monitor with increased mobility 10.Hypothyroidism. Synthroid 11. Hyperlipidemia. Zetia 12.Diet-controlled diabetes mellitus. Hemoglobin A1c 6.5. CBGs discontinued   Post Admission Physician Evaluation: 1. Functional deficits secondary  to bilateral pontine infarcts. 2. Patient is admitted to receive collaborative, interdisciplinary care between the physiatrist, rehab nursing staff, and therapy team. 3. Patient's level of medical complexity and substantial therapy needs in context of that medical necessity cannot be provided at a lesser intensity of care such as a SNF. 4. Patient has experienced substantial functional loss from his/her baseline which was documented above under the "Functional History" and "Functional Status" headings.  Judging by the patient's diagnosis, physical exam, and functional history, the patient has potential for functional progress which will result in measurable gains while on inpatient rehab.  These gains will be of substantial and practical use upon discharge  in facilitating mobility and self-care at the household level. 5. Physiatrist will provide 24 hour management of medical needs as well as oversight of the therapy plan/treatment and provide guidance as appropriate regarding  the interaction of the two. 6. The Preadmission Screening has been reviewed and patient status is unchanged unless otherwise stated above. 7. 24 hour rehab nursing will assist with bladder management, bowel management, safety, skin/wound care, disease management, medication administration, pain management and patient education  and help integrate therapy concepts, techniques,education, etc. 8. PT will assess and treat for/with: Lower extremity strength, range of motion, stamina, balance, functional mobility, safety, adaptive techniques and equipment, NMR, family education .   Goals are: supervision to min assist. 9. OT will assess and treat for/with: ADL's, functional mobility, safety, upper extremity strength, adaptive techniques and equipment, NMR, family education.   Goals are: supervision to min assist. Therapy may proceed with showering this patient. 10. SLP will assess and treat for/with: cognition, communication, swallowing.  Goals are: mod I. 11. Case Management and Social Worker will assess and treat for psychological issues and discharge planning. 12. Team conference will be held weekly to assess progress toward goals and to determine barriers to discharge. 13. Patient will receive at least 3 hours of therapy per day at least 5 days per week. 14. ELOS: 15-19 days       15. Prognosis:  excellent     I have personally performed a face to face diagnostic evaluation of this patient and formulated the key components of the plan.  Additionally, I have personally reviewed laboratory data, imaging studies, as well as relevant notes and concur with the physician assistant's documentation above.   Ranelle Oyster, MD, [place] [facility]     Mcarthur Rossetti Angiulli, PA-C 12/20/2017  The patient's status has not changed. The original post admission physician evaluation remains appropriate, and any changes from the pre-admission screening or documentation from the acute chart are noted above.  Ranelle Oyster,  MD 12/20/2017

## 2017-12-20 NOTE — Progress Notes (Signed)
Brandy Flowers to be D/C'd Rehab per MD order.  Discussed with the patient and all questions fully answered.  VSS, Skin clean, dry and intact without evidence of skin break down, no evidence of skin tears noted. IV catheter discontinued intact. Site without signs and symptoms of complications. Dressing and pressure applied.  An After Visit Summary was printed and given to the patient. Patient received prescription.  D/c education completed with patient/family including follow up instructions, medication list, d/c activities limitations if indicated, with other d/c instructions as indicated by MD - patient able to verbalize understanding, all questions fully answered.   Patient instructed to return to ED, call 911, or call MD for any changes in condition.   Patient escorted via WC, and D/C home via private auto.  Eligah East 12/20/2017 2:55 PM

## 2017-12-20 NOTE — Progress Notes (Signed)
IP rehab admissions - I met with patient and she is very agreeable to inpatient rehab admission.  She says the rehab MD yesterday did not understand her.  I will contact Dr. Broadus John.  Bed available and will plan to admit to CIR today.  Call me for questions.  [phone]

## 2017-12-20 NOTE — Progress Notes (Signed)
Marcello Fennel, MD  Physician  Physical Medicine and Rehabilitation  Consult Note  Signed  Date of Service:  12/19/2017 9:18 AM       Related encounter: ED to Hosp-Admission (Discharged) from 12/17/2017 in [facility]      Signed      Expand All Collapse All    Show:Clear all [x] Manual[x] Template[] Copied  Added by: [x] Angiulli, Mcarthur Rossetti, PA-C[x] Marcello Fennel, MD  [] Hover for details      Physical Medicine and Rehabilitation Consult Reason for Consult: Gait disorder/dysarthria with decreased functional mobility Referring Physician: [facility]   HPI: Brandy Flowers is a 77 y.o. right-handed female history of hypertension, renal artery stenosis, diabetes mellitus.  Per chart review and patient, patient lives alone.  Independent prior to admission.  One level home 2 steps to entry.  She has a brother who is a Engineer, civil (consulting) and states she will be living with him post discharge.  Presented 12/17/2017 with slurred speech and altered mental status.  Cranial CT scan reviewed, unremarkable for acute cranial process.  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  Patient did not receive TPA.  CT angiogram of head and neck with no significant carotid or vertebral artery stenosis in the neck.  Negative for emergent large vessel occlusion.  Echocardiogram with ejection fraction of 70% grade 1 diastolic dysfunction.  No wall motion abnormalities.  Currently on aspirin and Plavix x3 months then Plavix alone.  Dysphagia #1 pudding thick liquid diet.  Therapy evaluations completed with recommendations of physical medicine rehab consult.   Review of Systems  Constitutional: Negative for chills and fever.  HENT: Negative for hearing loss.   Eyes: Negative for blurred vision and double vision.  Respiratory: Negative for cough and shortness of breath.   Cardiovascular: Negative for chest pain and palpitations.  Gastrointestinal: Positive for  constipation. Negative for nausea and vomiting.  Genitourinary: Negative for dysuria, hematuria and urgency.  Musculoskeletal: Positive for joint pain and myalgias.  Skin: Negative for rash.  Neurological: Positive for dizziness, speech change and focal weakness. Negative for sensory change.  Psychiatric/Behavioral:       Anxiety  All other systems reviewed and are negative.      Past Medical History:  Diagnosis Date  . Anxiety   . Arthritis   . Diabetes mellitus without complication (HCC)   . Hypertension   . Sinus problem   . Thyroid disease         Past Surgical History:  Procedure Laterality Date  . BREAST SURGERY    . EYE SURGERY          Family History  Problem Relation Age of Onset  . Stroke Mother   . Diabetes Mother   . Heart disease Mother        Before age 55  . Hypertension Mother   . Hyperlipidemia Mother   . Heart disease Father   . Heart attack Father   . Heart disease Brother        After age 31- CABG  . Hypertension Brother    Social History:  reports that she has never smoked. She has never used smokeless tobacco. She reports that she does not drink alcohol or use drugs. Allergies:      Allergies  Allergen Reactions  . Blue Dyes (Parenteral) Shortness Of Breath and Rash  . Sulfa Antibiotics Anaphylaxis  . Ace Inhibitors Nausea And Vomiting  . Norvasc [Amlodipine] Nausea And Vomiting  . Calcium Channel Blockers Diarrhea  Medications Prior to Admission  Medication Sig Dispense Refill  . acetaminophen-codeine (TYLENOL #3) 300-30 MG per tablet Take 0.5 tablets by mouth daily as needed (severe headache).     Marland Kitchen aspirin EC 325 MG tablet Take 325-650 mg by mouth 3 (three) times daily as needed (headache).    Marland Kitchen atenolol (TENORMIN) 50 MG tablet Take 100 mg by mouth daily.     Marland Kitchen levothyroxine (SYNTHROID, LEVOTHROID) 50 MCG tablet Take 100 mcg by mouth daily before breakfast.     . lidocaine (LIDODERM) 5 % Place  1 patch onto the skin daily as needed (pain).     . LORazepam (ATIVAN) 1 MG tablet Take 1 mg by mouth See admin instructions. Take 1 tablet (1 mg) by mouth twice daily, may take a 3rd tablet (1 mg) during the day as needed for SBP >170, may also take a 4th tablet during the day as needed for anxiety.  5  . meloxicam (MOBIC) 15 MG tablet Take 15 mg by mouth daily as needed (leg pain).     . nitroGLYCERIN (NITROSTAT) 0.4 MG SL tablet Place 0.4 mg under the tongue daily as needed (SBP >180).   99    Home: Home Living Family/patient expects to be discharged to:: Private residence Living Arrangements: Alone Available Help at Discharge: Family, Personal care attendant, Available 24 hours/day, Friend(s) Home Access: Stairs to enter Secretary/administrator of Steps: 2 Entrance Stairs-Rails: Right Home Layout: One level Bathroom Shower/Tub: Engineer, manufacturing systems: Standard Home Equipment: None Additional Comments: lives with her dog.  She is unable to tell me who will stay with her, but insists that she will have 24 hour physical assist, and that someone is taking care of that   Functional History: Prior Function Level of Independence: Independent Comments: Pt was fully independent PTA.   Functional Status:  Mobility: Bed Mobility Overal bed mobility: Needs Assistance Bed Mobility: Sidelying to Sit, Sit to Supine Sidelying to sit: Min assist Sit to supine: Min assist General bed mobility comments: Assist to lift trunk from bed and assist to lift Rt LE onto bed  Transfers Overall transfer level: Needs assistance Equipment used: 1 person hand held assist, Rolling walker (2 wheeled) Transfers: Sit to/from Stand Sit to Stand: Mod assist General transfer comment: Mod A to power up cues for hand placement on RW. unable to rise without assistance and stabilization. less lean than noted in OT session  Ambulation/Gait General Gait Details: defered due to dizziness and premature  sitting.  ADL: ADL Overall ADL's : Needs assistance/impaired Eating/Feeding: NPO, Sitting Grooming: Wash/dry hands, Wash/dry face, Oral care, Brushing hair, Minimal assistance, Sitting Upper Body Bathing: Minimal assistance, Sitting Lower Body Bathing: Maximal assistance, Sit to/from stand Upper Body Dressing : Moderate assistance, Sitting Lower Body Dressing: Total assistance, Sit to/from stand Lower Body Dressing Details (indicate cue type and reason): Pt states she can doff her socks, but when asked to do so, she was unable as she became dizzy when she attempted to lean forward  Toilet Transfer: Maximal assistance, +2 for safety/equipment, Squat-pivot, BSC Toileting- Clothing Manipulation and Hygiene: Total assistance, Sit to/from stand Functional mobility during ADLs: Maximal assistance  Cognition: Cognition Overall Cognitive Status: No family/caregiver present to determine baseline cognitive functioning Orientation Level: Oriented X4 Cognition Arousal/Alertness: Awake/alert Behavior During Therapy: Anxious, Flat affect Overall Cognitive Status: No family/caregiver present to determine baseline cognitive functioning Area of Impairment: Attention, Awareness, Problem solving Current Attention Level: Sustained Awareness: Intellectual Problem Solving: Difficulty sequencing, Requires verbal cues, Requires  tactile cues General Comments: pt begining to recongize more awarness of deficits and seems distraught durnig visit. wants to recover at home but may be poorly accepting she will need rehab first.   Blood pressure (!) 184/42, pulse 61, temperature (!) 97.5 F (36.4 C), temperature source Oral, resp. rate 20, SpO2 96 %. Physical Exam  Vitals reviewed. Constitutional: She is oriented to person, place, and time. She appears well-developed and well-nourished.  HENT:  Head: Normocephalic and atraumatic.  Eyes: EOM are normal. Right eye exhibits no discharge. Left eye exhibits no  discharge.  Neck: Normal range of motion. Neck supple. No thyromegaly present.  Cardiovascular: Normal rate and regular rhythm.  Respiratory: Effort normal and breath sounds normal. No respiratory distress.  GI: Soft. Bowel sounds are normal. She exhibits no distension.  Musculoskeletal:  No edema or tenderness in extremities  Neurological: She is alert and oriented to person, place, and time.  Dysarthric speech but intelligible.   Follows simple commands.   Dysarthria Motor: Left upper extremity: 4/5 proximal distal Right upper extremity: 4--4/5 proximal distal Left lower extremity: Hip flexion, knee extension: 3+-4-/5, ankle dorsiflexion 4 medicine/5 Right lower extremity: Hip flexion, knee extension 3/5, ankle dorsiflexion 3+/5  Skin: Skin is warm and dry.  Psychiatric: Her affect is blunt. Her speech is slurred.    LabResultsLast24Hours  No results found for this or any previous visit (from the past 24 hour(s)).    ImagingResults(Last48hours)  Ct Angio Head W Or Wo Contrast  Result Date: 12/17/2017 CLINICAL DATA:  Acute infarct pons EXAM: CT ANGIOGRAPHY HEAD AND NECK TECHNIQUE: Multidetector CT imaging of the head and neck was performed using the standard protocol during bolus administration of intravenous contrast. Multiplanar CT image reconstructions and MIPs were obtained to evaluate the vascular anatomy. Carotid stenosis measurements (when applicable) are obtained utilizing NASCET criteria, using the distal internal carotid diameter as the denominator. CONTRAST:  50mL ISOVUE-370 IOPAMIDOL (ISOVUE-370) INJECTION 76% COMPARISON:  MRI head 12/17/2017 FINDINGS: CTA NECK FINDINGS Aortic arch: Mild atherosclerotic calcification aortic arch. Proximal great vessels are tortuous with mild atherosclerotic disease but widely patent. Right carotid system: Right common carotid artery widely patent and tortuous. Mild atherosclerotic calcification right carotid bifurcation without  stenosis Left carotid system: Tortuosity left common carotid artery which is widely patent. Left carotid bifurcation widely patent with minimal calcification. Vertebral arteries: Both vertebral arteries are patent to the basilar. Stenosis distal left vertebral artery. Skeleton: No acute skeletal abnormality. Other neck: Negative for mass or adenopathy Upper chest: Negative Review of the MIP images confirms the above findings CTA HEAD FINDINGS Anterior circulation: Circumferential calcification right supraclinoid internal carotid artery with moderate stenosis. Mild stenosis left supraclinoid internal carotid artery. Both anterior middle cerebral arteries are patent without occlusion. Mild irregularity in the middle cerebral artery branches and anterior cerebral artery branches bilaterally due to atherosclerotic disease. Posterior circulation: Moderate stenosis distal left vertebral artery due to calcified plaque. Mild stenosis mid basilar which may account for the pontine infarct. Superior cerebellar and posterior cerebral arteries patent bilaterally. Mild atherosclerotic stenosis throughout the posterior cerebral artery bilaterally. Venous sinuses: Patent Anatomic variants: None Delayed phase: Normal enhancement on delayed imaging Review of the MIP images confirms the above findings IMPRESSION: 1. No significant carotid or vertebral artery stenosis in the neck 2. 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 Moderate stenosis distal right internal carotid artery and mild stenosis distal left internal carotid artery. Diffuse intracranial atherosclerotic disease. 4. Negative for emergent large vessel occlusion. 5. Mild  stenosis mid basilar may account for the acute pontine infarct. Electronically Signed   By: Marlan Palau M.D.   On: 12/17/2017 19:15   Dg Chest 2 View  Result Date: 12/17/2017 CLINICAL DATA:  Slurred speech. EXAM: CHEST - 2 VIEW COMPARISON:  None. FINDINGS: Mild cardiomegaly.  Normal pulmonary vascularity. Atherosclerotic calcification of the aortic arch. No focal consolidation, pleural effusion, or pneumothorax. No acute osseous abnormality. IMPRESSION: 1.  No active cardiopulmonary disease.  Mild cardiomegaly. 2.  Aortic atherosclerosis (ICD10-I70.0). Electronically Signed   By: Obie Dredge M.D.   On: 12/17/2017 19:30   Ct Head Wo Contrast  Result Date: 12/17/2017 CLINICAL DATA:  Slurred speech since yesterday. EXAM: CT HEAD WITHOUT CONTRAST TECHNIQUE: Contiguous axial images were obtained from the base of the skull through the vertex without intravenous contrast. COMPARISON:  None. FINDINGS: Brain: No evidence of acute infarction, hemorrhage, hydrocephalus, extra-axial collection or mass lesion/mass effect. Moderate brain parenchymal volume loss. Minimal deep white matter microangiopathy. Vascular: Calcific atherosclerotic disease at the skull base. Skull: Normal. Negative for fracture or focal lesion. Sinuses/Orbits: Polypoid mucosal thickening with chronic periosteal reaction of the left maxillary sinus, suggestive of chronic sinusitis. Milder polypoid mucosal thickening of the ethmoid sinuses. Other: None. IMPRESSION: No acute intracranial abnormality. Chronic left maxillary sinusitis.  Mild bilateral ethmoid sinusitis. Electronically Signed   By: Ted Mcalpine M.D.   On: 12/17/2017 10:47   Ct Angio Neck W Or Wo Contrast  Result Date: 12/17/2017 CLINICAL DATA:  Acute infarct pons EXAM: CT ANGIOGRAPHY HEAD AND NECK TECHNIQUE: Multidetector CT imaging of the head and neck was performed using the standard protocol during bolus administration of intravenous contrast. Multiplanar CT image reconstructions and MIPs were obtained to evaluate the vascular anatomy. Carotid stenosis measurements (when applicable) are obtained utilizing NASCET criteria, using the distal internal carotid diameter as the denominator. CONTRAST:  50mL ISOVUE-370 IOPAMIDOL (ISOVUE-370)  INJECTION 76% COMPARISON:  MRI head 12/17/2017 FINDINGS: CTA NECK FINDINGS Aortic arch: Mild atherosclerotic calcification aortic arch. Proximal great vessels are tortuous with mild atherosclerotic disease but widely patent. Right carotid system: Right common carotid artery widely patent and tortuous. Mild atherosclerotic calcification right carotid bifurcation without stenosis Left carotid system: Tortuosity left common carotid artery which is widely patent. Left carotid bifurcation widely patent with minimal calcification. Vertebral arteries: Both vertebral arteries are patent to the basilar. Stenosis distal left vertebral artery. Skeleton: No acute skeletal abnormality. Other neck: Negative for mass or adenopathy Upper chest: Negative Review of the MIP images confirms the above findings CTA HEAD FINDINGS Anterior circulation: Circumferential calcification right supraclinoid internal carotid artery with moderate stenosis. Mild stenosis left supraclinoid internal carotid artery. Both anterior middle cerebral arteries are patent without occlusion. Mild irregularity in the middle cerebral artery branches and anterior cerebral artery branches bilaterally due to atherosclerotic disease. Posterior circulation: Moderate stenosis distal left vertebral artery due to calcified plaque. Mild stenosis mid basilar which may account for the pontine infarct. Superior cerebellar and posterior cerebral arteries patent bilaterally. Mild atherosclerotic stenosis throughout the posterior cerebral artery bilaterally. Venous sinuses: Patent Anatomic variants: None Delayed phase: Normal enhancement on delayed imaging Review of the MIP images confirms the above findings IMPRESSION: 1. No significant carotid or vertebral artery stenosis in the neck 2. 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 Moderate stenosis distal right internal carotid artery and mild stenosis distal left internal carotid artery. Diffuse intracranial  atherosclerotic disease. 4. Negative for emergent large vessel occlusion. 5. Mild stenosis mid basilar may account for  the acute pontine infarct. Electronically Signed   By: Marlan Palau M.D.   On: 12/17/2017 19:15   Mr Brain Wo Contrast (neuro Protocol)  Result Date: 12/17/2017 CLINICAL DATA:  Focal neuro deficit, greater than 6 hours stroke suspected. Slurred speech beginning yesterday. EXAM: MRI HEAD WITHOUT CONTRAST TECHNIQUE: Multiplanar, multiecho pulse sequences of the brain and surrounding structures were obtained without intravenous contrast. COMPARISON:  CT head without contrast 12/17/2017 FINDINGS: Brain: Bilateral acute nonhemorrhagic infarcts are noted in the anterior pons. Right paramedian infarct slightly larger than the left. Associated T2 signal changes are evident. Mild white matter changes are otherwise within normal limits for age. Ventricles are of normal size. No significant extra-axial fluid collection is present. Vascular: Flow is present in the major intracranial arteries. Skull and upper cervical spine: The skull base is within normal limits. Craniocervical junction is normal. The upper cervical spine is within limits. Sinuses/Orbits: Mild mucosal thickening is present posteriorly in the left maxillary sinus. The remaining paranasal sinuses and the mastoid air cells are clear. Bilateral lens replacements are present. Globes and orbits are within normal limits. IMPRESSION: 1. 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 2. No evidence for other acute or remote infarcts. 3. Mild left maxillary sinus disease. Electronically Signed   By: Marin Roberts M.D.   On: 12/17/2017 16:25   Dg Swallowing Func-speech Pathology  Result Date: 12/18/2017 Objective Swallowing Evaluation: Type of Study: MBS-Modified Barium Swallow Study  Patient Details Name: Elice Crigger MRN: [id number] Date of Birth: Dec 19, 1940 Today's Date: 12/18/2017 Time: SLP Start Time (ACUTE  ONLY): 1230 -SLP Stop Time (ACUTE ONLY): 1252 SLP Time Calculation (min) (ACUTE ONLY): 22 min Past Medical History: Past Medical History: Diagnosis Date . Anxiety  . Arthritis  . Diabetes mellitus without complication (HCC)  . Hypertension  . Sinus problem  . Thyroid disease  Past Surgical History: Past Surgical History: Procedure Laterality Date . BREAST SURGERY   . EYE SURGERY   HPI: 77 y.o. female with medical history significant of HTN, Anxiety, DM, Hypothyroidism, Renal artery stenosis, was brought to the ED by her brother due to worsening speech difficulty. MRI was positive for acute bilateral pontine ischemic strokes.  No data recorded Assessment / Plan / Recommendation CHL IP CLINICAL IMPRESSIONS 12/18/2017 Clinical Impression Patient presents with a moderate oropharyngeal dysphagia. Orally, patient with mildly delayed A-P transit of bolus, intermttently decreased bolus cohesion, and mild oral residuals with premature spillage over the base of the tongue. Following, delayed swallow initiation combined with decreased laryngeal closure results in deep penetration with eventual aspiration of viscosities thinner than pudding thick. Attempted use of chin tuck which was inconsistently effective. Should be noted however that patient with some difficulty with carryout of strategy. SLP wonders if strategy would have been effective with liquid consistencies if patient had been able to consistently utilize. Mild vallecular residuals remain post swallow with solids due to base of tongue weakness increasing risk of post swallow aspiration,  although airway generally better protected. Recommend initiation of a conservative diet, puree (dys 1) with pudding thick liquids with close SLP f/u.  SLP Visit Diagnosis Dysphagia, oropharyngeal phase (R13.12) Attention and concentration deficit following -- Frontal lobe and executive function deficit following -- Impact on safety and function Severe aspiration risk   [facility] IP TREATMENT  RECOMMENDATION 12/18/2017 Treatment Recommendations Therapy as outlined in treatment plan below   Prognosis 12/18/2017 Prognosis for Safe Diet Advancement Good Barriers to Reach Goals -- Barriers/Prognosis Comment -- [facility] IP DIET RECOMMENDATION 12/18/2017 SLP Diet  Recommendations Dysphagia 1 (Puree) solids;Pudding thick liquid Liquid Administration via Spoon Medication Administration Crushed with puree Compensations Small sips/bites;Slow rate Postural Changes Seated upright at 90 degrees   CH[facility] IP OTHER RECOMMENDATIONS 12/18/2017 Recommended Consults -- Oral Care Recommendations Oral care BID Other Recommendations Order thickener from pharmacy;Prohibited food (jello, ice cream, thin soups);Remove water pitcher   [facility] IP FOLLOW UP RECOMMENDATIONS 12/18/2017 Follow up Recommendations Inpatient Rehab   [facility] IP FREQUENCY AND DURATION 12/18/2017 Speech Therapy Frequency (ACUTE ONLY) min 3x week Treatment Duration 2 weeks      CHL IP ORAL PHASE 12/18/2017 Oral Phase Impaired Oral - Pudding Teaspoon -- Oral - Pudding Cup -- Oral - Honey Teaspoon Lingual/palatal residue;Delayed oral transit Oral - Honey Cup Lingual/palatal residue;Delayed oral transit Oral - Nectar Teaspoon Lingual/palatal residue;Delayed oral transit Oral - Nectar Cup Lingual/palatal residue;Delayed oral transit Oral - Nectar Straw -- Oral - Thin Teaspoon Lingual/palatal residue;Delayed oral transit Oral - Thin Cup Lingual/palatal residue;Delayed oral transit Oral - Thin Straw -- Oral - Puree Lingual/palatal residue;Delayed oral transit Oral - Mech Soft Lingual/palatal residue;Delayed oral transit Oral - Regular -- Oral - Multi-Consistency -- Oral - Pill -- Oral Phase - Comment --  CHL IP PHARYNGEAL PHASE 12/18/2017 Pharyngeal Phase Impaired Pharyngeal- Pudding Teaspoon -- Pharyngeal -- Pharyngeal- Pudding Cup -- Pharyngeal -- Pharyngeal- Honey Teaspoon Delayed swallow initiation-vallecula;Penetration/Aspiration during swallow;Reduced airway/laryngeal closure  Pharyngeal Material enters airway, CONTACTS cords and not ejected out;Material enters airway, passes BELOW cords without attempt by patient to eject out (silent aspiration) Pharyngeal- Honey Cup Delayed swallow initiation-pyriform sinuses;Reduced airway/laryngeal closure;Penetration/Aspiration during swallow Pharyngeal Material enters airway, passes BELOW cords without attempt by patient to eject out (silent aspiration);Material enters airway, CONTACTS cords and not ejected out Pharyngeal- Nectar Teaspoon Delayed swallow initiation-vallecula;Reduced airway/laryngeal closure;Penetration/Aspiration during swallow Pharyngeal Material enters airway, CONTACTS cords and then ejected out;Material enters airway, passes BELOW cords without attempt by patient to eject out (silent aspiration) Pharyngeal- Nectar Cup Delayed swallow initiation-pyriform sinuses;Reduced airway/laryngeal closure;Penetration/Aspiration during swallow Pharyngeal Material enters airway, CONTACTS cords and not ejected out;Material enters airway, passes BELOW cords without attempt by patient to eject out (silent aspiration) Pharyngeal- Nectar Straw -- Pharyngeal -- Pharyngeal- Thin Teaspoon Delayed swallow initiation-pyriform sinuses;Reduced airway/laryngeal closure;Penetration/Aspiration during swallow Pharyngeal Material enters airway, CONTACTS cords and not ejected out Pharyngeal- Thin Cup Delayed swallow initiation-pyriform sinuses;Reduced airway/laryngeal closure;Penetration/Aspiration during swallow Pharyngeal Material enters airway, passes BELOW cords and not ejected out despite cough attempt by patient Pharyngeal- Thin Straw -- Pharyngeal -- Pharyngeal- Puree Delayed swallow initiation-vallecula;Reduced tongue base retraction;Pharyngeal residue - valleculae Pharyngeal -- Pharyngeal- Mechanical Soft Delayed swallow initiation-vallecula;Reduced tongue base retraction;Pharyngeal residue - valleculae Pharyngeal -- Pharyngeal- Regular -- Pharyngeal  -- Pharyngeal- Multi-consistency -- Pharyngeal -- Pharyngeal- Pill -- Pharyngeal -- Pharyngeal Comment --  CH[facility] IP CERVICAL ESOPHAGEAL PHASE 12/18/2017 Cervical Esophageal Phase WFL Pudding Teaspoon -- Pudding Cup -- Honey Teaspoon -- Honey Cup -- Nectar Teaspoon -- Nectar Cup -- Nectar Straw -- Thin Teaspoon -- Thin Cup -- Thin Straw -- Puree -- Mechanical Soft -- Regular -- Multi-consistency -- Pill -- Cervical Esophageal Comment -- Ferdinand Lango MA, CCC-SLP McCoy Leah Meryl 12/18/2017, 1:13 PM              Mr Maxine Glenn Head Wo Contrast  Result Date: 12/17/2017 CLINICAL DATA:  Acute pontine stroke EXAM: MRA HEAD WITHOUT CONTRAST TECHNIQUE: Angiographic images of the Circle of Willis were obtained using MRA technique without intravenous contrast. COMPARISON:  CT a head 12/17/2017, MRI head 12/17/2017 FINDINGS: Image quality degraded by significant motion.  CTA head is of better quality. See those results. Negative for emergent large vessel occlusion. Mild stenosis mid pons. Atherosclerotic irregularity throughout the anterior cerebral arteries, posterior cerebral arteries, and middle cerebral arteries bilaterally. IMPRESSION: Study limited by motion.  See prior CTA results Electronically Signed   By: Marlan Palau M.D.   On: 12/17/2017 19:47     Assessment/Plan: Diagnosis: Bilateral anterior pontine nonhemorrhagic infarction Labs and images (see above) independently reviewed.  Records reviewed and summated above. Stroke: Continue secondary stroke prophylaxis and Risk Factor Modification listed below:   Antiplatelet therapy:   Blood Pressure Management:  Continue current medication with prn's with permisive HTN per primary team Statin Agent:   Diabetes management:   Bilateral hemiparesis Motor recovery: Fluoxetine  1. Does the need for close, 24 hr/day medical supervision in concert with the patient's rehab needs make it unreasonable for this patient to be served in a less intensive setting? Yes   2. Co-Morbidities requiring supervision/potential complications: HTN (monitor and provide prns in accordance with increased physical exertion and pain), renal artery stenosis, DM (Monitor in accordance with exercise and adjust meds as necessary), diastolic dysfunction (monitor for signs and symptoms of fluid overload), post-stroke dysphagia (advance diet as tolerated), hypokalemia (continue to monitor and replete as necessary) 3. Due to safety, disease management and patient education, does the patient require 24 hr/day rehab nursing? Yes 4. Does the patient require coordinated care of a physician, rehab nurse, PT (1-2 hrs/day, 5 days/week) and OT (1-2 hrs/day, 5 days/week) to address physical and functional deficits in the context of the above medical diagnosis(es)? Yes Addressing deficits in the following areas: balance, endurance, locomotion, strength, transferring, bathing, dressing, toileting and psychosocial support 5. Can the patient actively participate in an intensive therapy program of at least 3 hrs of therapy per day at least 5 days per week? Yes 6. The potential for patient to make measurable gains while on inpatient rehab is excellent 7. Anticipated functional outcomes upon discharge from inpatient rehab are supervision and min assist  with PT, supervision and min assist with OT, n/a with SLP. 8. Estimated rehab length of stay to reach the above functional goals is: 15-19 days. 9. Anticipated D/C setting: Other 10. Anticipated post D/C treatments: HH therapy and Home excercise program 11. Overall Rehab/Functional Prognosis: good  RECOMMENDATIONS: This patient's condition is appropriate for continued rehabilitative care in the following setting: Patient refusing CIR at this time requesting to go home with home health.  Given current deficits and lack of support at discharge do not believe this is a safe disposition.  Recommend SNF at present. Patient has agreed to participate in  recommended program. Potentially Note that insurance prior authorization may be required for reimbursement for recommended care.  Comment: Rehab Admissions Coordinator to follow up.   I have personally performed a face to face diagnostic evaluation, including, but not limited to relevant history and physical exam findings, of this patient and developed relevant assessment and plan.  Additionally, I have reviewed and concur with the physician assistant's documentation above.   Maryla Morrow, MD, ABPMR Mcarthur Rossetti Angiulli, PA-C 12/19/2017        Revision History                        Routing History

## 2017-12-21 ENCOUNTER — Inpatient Hospital Stay (HOSPITAL_COMMUNITY): Payer: Medicare Other | Admitting: Physical Therapy

## 2017-12-21 ENCOUNTER — Other Ambulatory Visit: Payer: Self-pay

## 2017-12-21 ENCOUNTER — Inpatient Hospital Stay (HOSPITAL_COMMUNITY): Payer: Medicare Other

## 2017-12-21 ENCOUNTER — Inpatient Hospital Stay (HOSPITAL_COMMUNITY): Payer: Medicare Other | Admitting: Speech Pathology

## 2017-12-21 ENCOUNTER — Inpatient Hospital Stay (HOSPITAL_COMMUNITY): Payer: Medicare Other | Admitting: Occupational Therapy

## 2017-12-21 DIAGNOSIS — I69393 Ataxia following cerebral infarction: Secondary | ICD-10-CM

## 2017-12-21 DIAGNOSIS — R42 Dizziness and giddiness: Secondary | ICD-10-CM

## 2017-12-21 LAB — CBC WITH DIFFERENTIAL/PLATELET
Abs Immature Granulocytes: 0.02 10*3/uL (ref 0.00–0.07)
BASOS ABS: 0.1 10*3/uL (ref 0.0–0.1)
Basophils Relative: 1 %
Eosinophils Absolute: 0.1 10*3/uL (ref 0.0–0.5)
Eosinophils Relative: 2 %
HEMATOCRIT: 40.3 % (ref 36.0–46.0)
HEMOGLOBIN: 13.4 g/dL (ref 12.0–15.0)
Immature Granulocytes: 0 %
LYMPHS ABS: 2.5 10*3/uL (ref 0.7–4.0)
LYMPHS PCT: 34 %
MCH: 27.8 pg (ref 26.0–34.0)
MCHC: 33.3 g/dL (ref 30.0–36.0)
MCV: 83.6 fL (ref 80.0–100.0)
MONOS PCT: 11 %
Monocytes Absolute: 0.8 10*3/uL (ref 0.1–1.0)
Neutro Abs: 3.8 10*3/uL (ref 1.7–7.7)
Neutrophils Relative %: 52 %
Platelets: 293 10*3/uL (ref 150–400)
RBC: 4.82 MIL/uL (ref 3.87–5.11)
RDW: 13 % (ref 11.5–15.5)
WBC: 7.4 10*3/uL (ref 4.0–10.5)
nRBC: 0 % (ref 0.0–0.2)

## 2017-12-21 LAB — COMPREHENSIVE METABOLIC PANEL
ALBUMIN: 3.9 g/dL (ref 3.5–5.0)
ALK PHOS: 45 U/L (ref 38–126)
ALT: 15 U/L (ref 0–44)
AST: 23 U/L (ref 15–41)
Anion gap: 8 (ref 5–15)
BILIRUBIN TOTAL: 0.8 mg/dL (ref 0.3–1.2)
BUN: 19 mg/dL (ref 8–23)
CALCIUM: 9.5 mg/dL (ref 8.9–10.3)
CO2: 27 mmol/L (ref 22–32)
Chloride: 109 mmol/L (ref 98–111)
Creatinine, Ser: 0.66 mg/dL (ref 0.44–1.00)
GFR calc Af Amer: >60 mL/min (ref >60)
Glucose, Bld: 186 mg/dL — ABNORMAL HIGH (ref 70–99)
POTASSIUM: 2.9 mmol/L — AB (ref 3.5–5.1)
Sodium: 144 mmol/L (ref 135–145)
TOTAL PROTEIN: 7 g/dL (ref 6.5–8.1)

## 2017-12-21 MED ORDER — SODIUM CHLORIDE 0.9 % IV SOLN: INTRAVENOUS | Status: DC

## 2017-12-21 MED ORDER — SODIUM CHLORIDE 0.45 % IV SOLN: INTRAVENOUS | Status: DC

## 2017-12-21 MED ORDER — CLONIDINE HCL 0.1 MG PO TABS: 0.1000 mg | ORAL_TABLET | Freq: Every day | ORAL | Status: DC

## 2017-12-21 MED ORDER — POTASSIUM CHLORIDE IN NACL 20-0.45 MEQ/L-% IV SOLN
INTRAVENOUS | Status: DC
  Administered 2017-12-21: 100 mL/h via INTRAVENOUS
  Administered 2017-12-22 – 2017-12-28 (×13): via INTRAVENOUS
  Filled 2017-12-21 (×18): qty 1000

## 2017-12-21 MED ORDER — POTASSIUM CHLORIDE CRYS ER 20 MEQ PO TBCR
40.0000 meq | EXTENDED_RELEASE_TABLET | Freq: Two times a day (BID) | ORAL | Status: DC
  Administered 2017-12-21 – 2018-01-10 (×41): 40 meq via ORAL
  Filled 2017-12-21 (×42): qty 2

## 2017-12-21 MED ORDER — CLONIDINE HCL 0.1 MG PO TABS
0.1000 mg | ORAL_TABLET | ORAL | Status: DC | PRN
  Administered 2017-12-21 – 2017-12-27 (×7): 0.1 mg via ORAL
  Filled 2017-12-21 (×7): qty 1

## 2017-12-21 NOTE — Evaluation (Signed)
Physical Therapy Assessment and Plan  Patient Details  Name: Brandy Flowers MRN: [id number] Date of Birth: 03-01-1940  PT Diagnosis: Abnormal posture, Abnormality of gait, Coordination disorder, Difficulty walking and Muscle weakness Rehab Potential: Good ELOS: 2-3 weeks    Today's Date: 12/21/2017 PT Individual Time: 1105-1200 PT Individual Time Calculation (min): 55 min    Problem List:  Patient Active Problem List   Diagnosis Date Noted  . 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 ([place]) 12/20/2017  . Oropharyngeal dysphagia   . Slurred speech   . Diastolic dysfunction   . Dysphagia, post-stroke   . Hypokalemia   . Stroke (cerebrum) ([place]) 12/17/2017  . Anxiety 12/17/2017  . 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 Left renal artery stenosis ([place]) 12/05/2014  . Type 2 diabetes, controlled, with neuropathy ([place]) 09/20/2013  . Hypertension 09/20/2013  . Hypothyroid 09/20/2013    Past Medical History:  Past Medical History:  Diagnosis Date  . Anxiety   . Arthritis   . Diabetes mellitus without complication ([place])   . Hypertension   . Sinus problem   . Thyroid disease    Past Surgical History:  Past Surgical History:  Procedure Laterality Date  . BREAST SURGERY    . EYE SURGERY      Assessment & Plan Clinical Impression: Brandy Flowers is a 77 year old right handed female history of hypertension, renal artery stenosis and diabetes mellitus. Per chart review and patient, patient lives alone. Independent prior to admission. One level home with 2 steps to entry. She has a brother who is a Marine scientist and states she will be living with him post discharge. Patient presented 12/17/2017 with slurred speech and altered mental status. Cranial CT scan reviewed, unremarkable for acute intracranial process. 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 Patient did not receive TPA. CT angiogram of head and neck with no significant carotid or vertebral artery  stenosis. Negative for emergent large vessel occlusion. Echocardiogram with ejection fraction of 79% grade 1 diastolic dysfunction. No wall motion abnormalities. Maintained on aspirin and Plavix 3 months and Plavix alone. Subcutaneous Lovenox for DVT prophylaxis.Dysphagia #1 pudding thick liquid diet. Therapy evaluations completed with recommendations of physical medicine rehabilitation consult. Patient was admitted for a comprehensive rehabilitation program.  Patient transferred to CIR on 12/20/2017 .   Patient currently requires mod with mobility secondary to muscle weakness, decreased coordination and decreased motor planning, decreased motor planning and decreased sitting balance, decreased standing balance, decreased postural control and decreased balance strategies.  Prior to hospitalization, patient was independent  with mobility and lived with Alone in a   home.  Home access is 3Stairs to enter.  Patient will benefit from skilled PT intervention to maximize safe functional mobility and minimize fall risk for planned discharge home with 24 hour supervision.  Anticipate patient will benefit from follow up [place] at discharge.  PT - End of Session Activity Tolerance: Tolerates 30+ min activity with multiple rests;Decreased this session Endurance Deficit: Yes Endurance Deficit Description: due to dizziness, pt only able to tolerate sitting EOB for 1-2 minutes  PT Assessment Rehab Potential (ACUTE/IP ONLY): Good PT Patient demonstrates impairments in the following area(s): Balance;Endurance;Motor;Perception;Safety PT Transfers Functional Problem(s): Bed Mobility;Bed to Chair;Car;Furniture;Floor PT Locomotion Functional Problem(s): Ambulation;Wheelchair Mobility;Stairs PT Plan PT Intensity: Minimum of 1-2 x/day ,45 to 90 minutes PT Frequency: 5 out of 7 days PT Duration Estimated Length of Stay: 2-3 weeks  PT Treatment/Interventions: Ambulation/gait training;Discharge planning;Functional mobility  training;Psychosocial support;Therapeutic Activities;Visual/perceptual remediation/compensation;Balance/vestibular training;Disease management/prevention;Neuromuscular re-education;Skin care/wound management;Therapeutic Exercise;Wheelchair propulsion/positioning;Cognitive  remediation/compensation;DME/adaptive equipment instruction;Pain management;Splinting/orthotics;UE/LE Strength taining/ROM;Community reintegration;Functional electrical stimulation;Patient/family education;Stair training;UE/LE Coordination activities PT Transfers Anticipated Outcome(s): S with LRAD  PT Locomotion Anticipated Outcome(s): S with LRAD  PT Recommendation Recommendations for Other Services: Neuropsych consult;Therapeutic Recreation consult Therapeutic Recreation Interventions: Pet therapy;Kitchen group;Stress management;Outing/community reintergration Follow Up Recommendations: Home health PT Patient destination: Home Equipment Recommended: 3 in 1 bedside comode;Rolling walker with 5" wheels;Tub/shower seat  Skilled Therapeutic Intervention  PT order received and evaluation/treatment plan initiated. Patient very limited by dizziness this session, which in turn severely limited scope and extent of evaluation. Performed vestibular evaluation- Dix-Hallpike negative bilaterally, no resting nystagmus, able to track through all visual fields without nystagmus; VOR seems for the most part intact but patient does demonstrate occasional slips off of visual target bilaterally although this is inconsistent and the majority of the time she was able to maintain fixation/tracking. No dizziness provoked during any vestibular tests. Able to complete bed mobility with ModA but then only able to maintain upright for 1-2 minutes due to severe dizziness; BP measures can be seen below. Returned patient to bed with MinA due to severe dizziness and elevated BP, focused remained of session on functional exercises for B LEs in supine. She was left  in bed with all needs met, bed alarm activated.   Blood pressures: Supine 176/64 Sitting 224/72 Return to supine 209/68 Following exercises in supine 186/66  PT Evaluation Precautions/Restrictions Precautions Precautions: Fall;Other (comment) Precaution Comments: watch BP, dizziness  Restrictions Weight Bearing Restrictions: No General Chart Reviewed: Yes Response to Previous Treatment: Patient reporting fatigue but able to participate. Family/Caregiver Present: No Vital SignsTherapy Vitals Pulse Rate: 66 BP: (!) 184/69 Pain Pain Assessment Pain Scale: 0-10 Pain Score: 0-No pain Patients Stated Pain Goal: 0 Multiple Pain Sites: No Home Living/Prior Functioning Home Living Available Help at Discharge: Family;Friend(s);Available 24 hours/day Home Access: Stairs to enter Entrance Stairs-Number of Steps: 3 Entrance Stairs-Rails: Right Home Layout: One level Bathroom Shower/Tub: Chiropodist: Standard Additional Comments: lives with her dog.  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: [employer] Leisure: Hobbies-yes (Comment) Comments: gardening Vision/Perception  Vision - Assessment Eye Alignment: Within Functional Limits Ocular Range of Motion: Within Functional Limits Alignment/Gaze Preference: Within Defined Limits Tracking/Visual Pursuits: Able to track stimulus in all quads without difficulty Saccades: Within functional limits Convergence: Within functional limits Perception Perception: Within Functional Limits Praxis Praxis: Intact  Cognition Overall Cognitive Status:  Impaired/Different from baseline Arousal/Alertness: Awake/alert Orientation Level: Oriented X4 Attention: Sustained Sustained Attention: Appears intact Memory: Appears intact Awareness: Impaired Awareness Impairment: Intellectual impairment Problem Solving: Impaired Problem Solving Impairment: Functional complex Safety/Judgment: Impaired Sensation Sensation Light Touch: Appears Intact Hot/Cold: Not tested Proprioception: Not tested Stereognosis: Not tested Coordination Gross Motor Movements are Fluid and Coordinated: No Fine Motor Movements are Fluid and Coordinated: No Coordination and Movement Description: unable to assess fully, limited by weakness and dizziness  Finger Nose Finger Test: DNT at eval, will perform when dizziness has improved  Heel Shin Test: DNT at eval, will perform when dizziness has improved  Motor  Motor Motor: Within Functional Limits Motor - Skilled Clinical Observations: Generalized motor weakness; did not perform MMT due  to inability to tolerate sitting EOB due to dizziness   Mobility Bed Mobility Bed Mobility: Rolling Right;Rolling Left;Supine to Sit;Sit to Supine Rolling Right: Supervision/verbal cueing Rolling Left: Supervision/Verbal cueing Supine to Sit: Moderate Assistance - Patient 50-74% Sit to Supine: Minimal Assistance - Patient > 75% Transfers Transfers: (unable to assess due to dizziness and elevated BP ) Locomotion  Gait Ambulation: No(unable to assess due to dizziness and BP ) Stairs / Additional Locomotion Stairs: No(unable to assess due to dizziness and BP ) Wheelchair Mobility Wheelchair Mobility: No(unable to assess due to dizziness and BP )  Trunk/Postural Assessment  Cervical Assessment Cervical Assessment: Exceptions to WFL(forward head ) Thoracic Assessment Thoracic Assessment: Exceptions to WFL(kyphotic ) Lumbar Assessment Lumbar Assessment: Within Functional Limits Postural Control Postural Control: (unable to  assess, poor sitting tolerance due to dizziness )  Balance Balance Balance Assessed: Yes Static Sitting Balance Static Sitting - Balance Support: Right upper extremity supported;Left upper extremity supported;Feet supported Static Sitting - Level of Assistance: 5: Stand by assistance Static Sitting - Comment/# of Minutes: 1-2 minutes due to dizziness Extremity Assessment  RUE Assessment RUE Assessment: Not tested Passive Range of Motion (PROM) Comments: WFL Active Range of Motion (AROM) Comments: sh flex 110 General Strength Comments: 2-/5 shoulder, 3+/ 5 distally LUE Assessment LUE Assessment: Not tested Passive Range of Motion (PROM) Comments: WFL Active Range of Motion (AROM) Comments: sh flex 150 General Strength Comments: 2-/5 shoulder, 3+/ 5 distally RLE Assessment RLE Assessment: Exceptions to [facility] General Strength Comments: did not perform standard MMT due to inabilty to tolerate sitting at EOB, estimate 3 to 3+/5 based off exercises and mobility  LLE Assessment LLE Assessment: Exceptions to [facility] General Strength Comments: did not perform standard MMT due to inabilty to tolerate sitting at EOB, estimate 3 to 3+/5 based off exercises and mobility     Refer to Care Plan for Long Term Goals  Recommendations for other services: Neuropsych and Therapeutic Recreation  Pet therapy, Kitchen group, Stress management and Outing/community reintegration  Discharge Criteria: Patient will be discharged from PT if patient refuses treatment 3 consecutive times without medical reason, if treatment goals not met, if there is a change in medical status, if patient makes no progress towards goals or if patient is discharged from hospital.  The above assessment, treatment plan, treatment alternatives and goals were discussed and mutually agreed upon: by patient  Deniece Ree PT, DPT, CBIS  Supplemental Physical Therapist [facility]    Pager [phone] Acute Rehab Office  [phone]

## 2017-12-21 NOTE — Evaluation (Signed)
Speech Language Pathology Assessment and Plan  Patient Details  Name: Brandy Flowers MRN: [id number] Date of Birth: [birth year]  SLP Diagnosis: Cognitive Impairments;Dysarthria;Dysphagia  Rehab Potential: Excellent ELOS: 15-19 days    Today's Date: 12/21/2017 SLP Individual Time: 7510-2585 SLP Individual Time Calculation (min): 60 min   Problem List:  Patient Active Problem List   Diagnosis Date Noted  . 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 ([place]) 12/20/2017  . Oropharyngeal dysphagia   . Slurred speech   . Diastolic dysfunction   . Dysphagia, post-stroke   . Hypokalemia   . Stroke (cerebrum) ([place]) 12/17/2017  . Anxiety 12/17/2017  . 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 Left renal artery stenosis ([place]) 12/05/2014  . Type 2 diabetes, controlled, with neuropathy ([place]) 09/20/2013  . Hypertension 09/20/2013  . Hypothyroid 09/20/2013   Past Medical History:  Past Medical History:  Diagnosis Date  . Anxiety   . Arthritis   . Diabetes mellitus without complication ([place])   . Hypertension   . Sinus problem   . Thyroid disease    Past Surgical History:  Past Surgical History:  Procedure Laterality Date  . BREAST SURGERY    . EYE SURGERY      Assessment / Plan / Recommendation Clinical Impression Patient is a 77 year old right handed female history of hypertension, renal artery stenosis and diabetes mellitus. Per chart review and patient, patient lives alone. Independent prior to admission. One level home with 2 steps to entry. She has a brother who is a Marine scientist and states she will be living with him post discharge. Patient presented 12/17/2017 with slurred speech and altered mental status. Cranial CT scan reviewed, unremarkable for acute intracranial process. 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 Patient did not receive TPA. CT angiogram of head and neck with no significant carotid or vertebral artery stenosis. Negative for  emergent large vessel occlusion. Echocardiogram with ejection fraction of 27% grade 1 diastolic dysfunction. No wall motion abnormalities. Maintained on aspirin and Plavix 3 months and Plavix alone. Subcutaneous Lovenox for DVT prophylaxis.Dysphagia #1 pudding thick liquid diet. Therapy evaluations completed with recommendations of physical medicine rehabilitation consult. Patient was admitted for a comprehensive rehabilitation program 12/20/17.    Patient demonstrates a mild-moderate dysarthria due to imprecise consonants and a slow rate of speech which minimizes intelligibility to ~90% at the sentence level. Mild memory impairments were also noted in regards to safety awareness, problem solving and intellectual awareness of deficits, especially in regards to severity of patient's current swallowing impairments. Patient consumed Dys. 1 textures with pudding thick liquids without overt s/s of aspiration. However, patient demonstrated porlogned AP transit, a suspected delay in swallow initiation and use of multiple swallows indicative of pharyngeal residue. Recommend patient continue current diet with full supervision for safety. Patient unable to perform pharyngeal strengthening exercises today due to inability to initiate a swallow without a bolus and use of saliva only. Patient would benefit from skilled SLP intervention to maximize her cognitive and swallowing function and speech intelligibility in order to maximize her functional independencen prior to discharge home.    Skilled Therapeutic Interventions          Administered a cognitive-linguistic evaluation and BSE.  Please see above for details. SLP educated patient in regards to pharyngeal strengthening exercises, however, patient unable to perform at this time despite Max A multimodal cues. However, patient able to utilize an effortful swallow with supervision verbal cues with pudding thick liquids and Dys. 1 textures. Suspect some difficulty with  pharyngeal  exercises is due to patient's inability to initiate a swallow with saliva only. Patient educated on goals of skilled SLP intervention, both verbalized understanding and agreement. Patient left upright in bed with alarm on. Continue with current plan of care.    SLP Assessment  Patient will need skilled Speech Lanaguage Pathology Services during CIR admission    Recommendations  SLP Diet Recommendations: Dysphagia 1 (Puree);Pudding Liquid Administration via: Spoon Medication Administration: Crushed with puree Supervision: Patient able to self feed;Full supervision/cueing for compensatory strategies Compensations: Slow rate;Small sips/bites Oral Care Recommendations: Oral care BID Patient destination: Home Follow up Recommendations: 24 hour supervision/assistance;Outpatient SLP;Home Health SLP Equipment Recommended: To be determined    SLP Frequency 3 to 5 out of 7 days   SLP Duration  SLP Intensity  SLP Treatment/Interventions 15-19 days  Minumum of 1-2 x/day, 30 to 90 minutes  Cognitive remediation/compensation;Dysphagia/aspiration precaution training;Internal/external aids;Speech/Language facilitation;Cueing hierarchy;Environmental controls;Therapeutic Activities;Patient/family education;Functional tasks    Pain No/Denies Pain    Short Term Goals: Week 1: SLP Short Term Goal 1 (Week 1): Patient will utilize speech intelligibility strategies at the conversation level with Min A verbal cues to achieve ~90% intelligibility.  SLP Short Term Goal 2 (Week 1): Patient will consume current diet with minimal overt s/s of aspiration with supervision verbal cues.  SLP Short Term Goal 3 (Week 1): Patient will consume trials of ice chips with minimal overt s/s of aspiration in 50% of trials with Min A verbal cues to assess readiness for repeat MBS.  SLP Short Term Goal 4 (Week 1): Patient will perform pharyngeal strengthening exercises with Mod A verbal cues.  SLP Short Term Goal 5  (Week 1): Patient will demonstrate functional problem solving for mildly complex tasks with supervision verbal cues.  SLP Short Term Goal 6 (Week 1): Patient will identify 2 impairments that put her at a high aspiration risk with Min A verbal cues.   Refer to Care Plan for Long Term Goals  Recommendations for other services: Neuropsych  Discharge Criteria: Patient will be discharged from SLP if patient refuses treatment 3 consecutive times without medical reason, if treatment goals not met, if there is a change in medical status, if patient makes no progress towards goals or if patient is discharged from hospital.  The above assessment, treatment plan, treatment alternatives and goals were discussed and mutually agreed upon: by patient  Adarryl Goldammer 12/21/2017, 9:20 AM

## 2017-12-21 NOTE — Progress Notes (Signed)
[place] PHYSICAL MEDICINE & REHABILITATION PROGRESS NOTE   Subjective/Complaints: Pt c/o increased dizziness today, "didn't have this yeaterday" No nausea or vomiting or hiccups + HA relieved with Tylenol Still requiring urinary cath Had BM this am Labs reviewed ROS- no CP, SOB, N/V/D Objective:   No results found. Recent Labs    12/20/17 1538 12/21/17 0423  WBC 9.0 7.4  HGB 13.1 13.4  HCT 40.6 40.3  PLT 326 293   Recent Labs    12/20/17 1538 12/21/17 0423  NA  --  144  K  --  2.9*  CL  --  109  CO2  --  27  GLUCOSE  --  186*  BUN  --  19  CREATININE 0.73 0.66  CALCIUM  --  9.5    Intake/Output Summary (Last 24 hours) at 12/21/2017 0929 Last data filed at 12/21/2017 0144 Gross per 24 hour  Intake 250 ml  Output 650 ml  Net -400 ml     Physical Exam: Vital Signs Blood pressure (!) 184/69, pulse 66, temperature 98.6 F (37 C), temperature source Oral, resp. rate 18, height 5\' 2"  (1.575 m), weight 58.2 kg, SpO2 97 %.   General: No acute distress Mood and affect are appropriate Heart: Regular rate and rhythm no rubs murmurs or extra sounds Lungs: Clear to auscultation, breathing unlabored, no rales or wheezes Abdomen: Positive bowel sounds, soft nontender to palpation, nondistended Extremities: No clubbing, cyanosis, or edema Skin: No evidence of breakdown, no evidence of rash Neurologic: Cranial nerves II through XII intact, motor strength is 4/5 in bilateral deltoid, bicep, tricep, grip, hip flexor, knee extensors, ankle dorsiflexor and plantar flexor Sensory exam normal sensation to light touch  in bilateral upper and lower extremities Cerebellar exam mild right  and moderate left l finger to nose to finger , min Right and mild left heel to shin dysmetria Musculoskeletal: Full range of motion in all 4 extremities. No joint swelling   Assessment/Plan: 1. 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 which require 3+ hours per day of  interdisciplinary therapy in a comprehensive inpatient rehab setting.  Physiatrist is providing close team supervision and 24 hour management of active medical problems listed below.  Physiatrist and rehab team continue to assess barriers to discharge/monitor patient progress toward functional and medical goals  Care Tool:  Bathing              Bathing assist Assist Level: Maximal Assistance - Patient 24 - 49%     Upper Body Dressing/Undressing Upper body dressing   What is the patient wearing?: Hospital gown only    Upper body assist Assist Level: Moderate Assistance - Patient 50 - 74%    Lower Body Dressing/Undressing Lower body dressing      What is the patient wearing?: Underwear/pull up     Lower body assist Assist for lower body dressing: Moderate Assistance - Patient 50 - 74%     Toileting Toileting    Toileting assist Assist for toileting: Moderate Assistance - Patient 50 - 74%     Transfers Chair/bed transfer  Transfers assist  Chair/bed transfer activity did not occur: Safety/medical concerns        Locomotion Ambulation   Ambulation assist              Walk 10 feet activity   Assist           Walk 50 feet activity   Assist  Walk 150 feet activity   Assist           Walk 10 feet on uneven surface  activity   Assist           Wheelchair     Assist               Wheelchair 50 feet with 2 turns activity    Assist            Wheelchair 150 feet activity     Assist            Medical Problem List and Plan: 1.Decreased functional mobility with dysarthria/dysphagiasecondary to 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 DVT Prophylaxis/Anticoagulation: subcutaneous Lovenox. Monitor for any bleeding episodes 3. Pain Management:Tylenol as needed 4. Mood:Ativan 1 mg 3 times a day as needed 5. Neuropsych: This  patientiscapable of making decisions on herown behalf. 6. Skin/Wound Care:routine skin checks 7. Fluids/Electrolytes/Nutrition:Encourage PO intake.  -Routine in and out's with follow-up chemistries 8.Dysphagia. Dysphagia #1 pudding thick liquids. Monitor hydration. Follow-up speech therapy BUN ok 11/6 9.PermissiveHypertension.Tenormin 25 mg daily. Monitor with increased mobility Vitals:   12/21/17 0436 12/21/17 0919  BP: (!) 165/61 (!) 184/69  Pulse: 68 66  Resp: 18   Temp: 98.6 F (37 C)   SpO2: 97%   BP within desired range 10.Hypothyroidism. Synthroid 11. Hyperlipidemia. Zetia 12.Diet-controlled diabetes mellitus. Hemoglobin A1c 6.5. CBGs discontinued 13.  HypoK- no diuretic, likely nutritional add supplement LOS: 14.  Increased dizziness, do not detect any new deficits, will check limited MRI to r/o pontine CVA  extension 1 days A FACE TO FACE EVALUATION WAS PERFORMED  Erick Colace 12/21/2017, 9:29 AM

## 2017-12-21 NOTE — Patient Care Conference (Signed)
Inpatient RehabilitationTeam Conference and Plan of Care Update Date: 12/21/2017   Time: 11:15 AM    Patient Name: Brandy Flowers      Medical Record Number: [id number]  Date of Birth: [birth year] Sex: Female         Room/Bed: 4W26C/4W26C-01 Payor Info: Payor: MEDICARE / Plan: MEDICARE PART A AND B / Product Type: *No Product type* /    Admitting Diagnosis: B CVA  Admit Date/Time:  12/20/2017  2:48 PM Admission Comments: No comment available   Primary Diagnosis:  <principal problem not specified> Principal Problem: <principal problem not specified>  Patient Active Problem List   Diagnosis Date Noted  . 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 Oropharyngeal dysphagia   . Slurred speech   . Diastolic dysfunction   . Dysphagia, post-stroke   . Hypokalemia   . Stroke (cerebrum) (HCC) 12/17/2017  . Anxiety 12/17/2017  . 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 Left renal artery stenosis (HCC) 12/05/2014  . 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 Hypertension 09/20/2013  . Hypothyroid 09/20/2013    Expected Discharge Date: Expected Discharge Date: (15 to 19 days)  Team Members Present: Physician leading conference: Dr. Claudette Laws Social Worker Present: Dossie Der, LCSW Nurse Present: Chana Bode, RN PT Present: Woodfin Ganja, PT OT Present: Roney Mans, OT SLP Present: Feliberto Gottron, SLP PPS Coordinator present : Tora Duck, RN, CRRN     Current Status/Progress Goal Weekly Team Focus  Medical   Patient complaining of increased dizziness, intermittent headaches relieved with Tylenol, pontine infarcts bilaterally with bilateral ataxia  Maintain medical stability, reduce recurrent stroke risk  MRI to evaluate for stroke extension   Bowel/Bladder   Incontinent of bladder, continent of bowel; LBM 12/21/17  Continent of bowel and bladder with moderate assistance  Assess bowel and bladder needs q shift and PRN and offer  toileting q 2h while awake   Swallow/Nutrition/ Hydration   Dys. 1 textures with pudding thick liquids, Min A  Supervision  trials of upgraded liquids, pharyngeal strengthening exercises    ADL's     eval pending        Mobility   bed mobility ModA, unable to tolerate sitting more than 2 minutes due to dizziness. Unable to progress mobility due to dizziness and elevated BP (224/72 sitting).   S with LRAD   strength, activity tolerance, gait as able, progression of activities as able    Communication   Min A  Mod I  use of speech intelligibility strategies at sentence/conversation level.    Safety/Cognition/ Behavioral Observations  Min A  Mod I  complex problem solving, emergent awarenes, safety awareness    Pain   No complaints of pain  Pain </= 3/10  Assess pain q shift and PRN and medicate when necessary   Skin   No current skin issues  Remain free from skin breakdown  Assess skin q shift and PRN      *See Care Plan and progress notes for long and short-term goals.     Barriers to Discharge  Current Status/Progress Possible Resolutions Date Resolved   Physician    Medical stability     Initiate rehabilitation program  See above      Nursing                  PT                    OT Decreased caregiver support  Pt's brother will live with her, but he works 3 12 hr shifts. Pt states she will have friends assist on those days.             SLP Nutrition means              SW                Discharge Planning/Teaching Needs:    Day one of evaluation brother can assist but does work. He is waiting pt's goals and progress to see if the discharge plan they have in place is realistic.     Team Discussion:  New evaluation-MD ordered another MRI due to pt feels more dizzy today and not moving as well. Pt is very anxious and asking for more xanax to calm her nerves. BP issues-MD assessing if hydrating herself is on pureed diet with pudding thick liquids.   Revisions to  Treatment Plan:  New eval    Continued Need for Acute Rehabilitation Level of Care: The patient requires daily medical management by a physician with specialized training in physical medicine and rehabilitation for the following conditions: Daily direction of a multidisciplinary physical rehabilitation program to ensure safe treatment while eliciting the highest outcome that is of practical value to the patient.: Yes Daily medical management of patient stability for increased activity during participation in an intensive rehabilitation regime.: Yes Daily analysis of laboratory values and/or radiology reports with any subsequent need for medication adjustment of medical intervention for : Neurological problems   I attest that I was present, lead the team conference, and concur with the assessment and plan of the team.   [name] 12/21/2017, 1:28 PM

## 2017-12-21 NOTE — Discharge Summary (Signed)
Physician Discharge Summary  Brandy Flowers ZOX:[id number] DOB: 1940/06/21 DOA: 12/17/2017  PCP: Philemon Kingdom, MD  Admit date: 12/17/2017 Discharge date: 12/20/2017  Time spent: 45 minutes  Recommendations for Outpatient Follow-up:  1. [facility] in 4 weeks 2. CIR for rehabilitation   Discharge Diagnoses:  Principal Problem:   Stroke (cerebrum) (HCC) Active Problems:   Type 2 diabetes, controlled, with neuropathy (HCC)   Hypertension   Left renal artery stenosis (HCC)   Anxiety   Slurred speech   Diastolic dysfunction   Dysphagia, post-stroke   Hypokalemia   Discharge Condition: Stable  Diet recommendation: Heart healthy  There were no vitals filed for this visit.  History of present illness:  Brandy Flowers a 77 y.o.femalewith medical history significant ofHTN, Anxiety, DM, Hypothyroidism, Renal artery stenosis, was brought to the ED by her brother due to worsening speech difficulty.  Hospital Course:   Acute bilateral pontine strokes -Noted on MRI, resultant dysarthria, improving -MRA -limited by motion - 2D echocardiogram showed preserved EF, no thrombus -CTA neck-Left V51moderate stenosis.Right VA origin stenosis. Mid BA stenosis.Moderate stenosisright ICA siphon. Diffuse intracranial atherosclerosis.  -LDL is 139 and hemoglobin W1X is 6.5 -Neurology consulted recommends DAPT with ASA 325 and plavix 75 for 3 months and then plavix alone -PT/OT/SLP evaluations completed, CIR recommended -Discharge to CIR for rehabilitation  Type 2 diabetes, controlled, with neuropathy (HCC) -not on medications -hemoglobin A1c is 6.5  Hypertension -Held spironolactone and amiloride -Resumed atenolol at a lower dose to prevent withdrawal tachycardia -Resume spironolactone, home dose of atenolol in 2 days  Left renal artery stenosis (HCC) -H/o50 to 60% left renal artery stenosis followed by Dr. Darrick Penna -On medical therapy with  antihypertensives  Anxiety -Severe baseline anxiety on 1 mg Ativan 4 times daily as needed -Resumed  Code Status:DNR  Consultations:  Neurology  Discharge Exam: Vitals:   12/20/17 0848 12/20/17 1320  BP: (!) 171/66 (!) 188/60  Pulse: (!) 59 (!) 57  Resp:  20  Temp:  98 F (36.7 C)  SpO2:  96%    General: Alert awake oriented x3 Cardiovascular: S1-S2/regular rate rhythm Respiratory: Clear bilaterally  Discharge Instructions   Discharge Instructions    Ambulatory referral to Neurology   Complete by:  As directed    Follow up with stroke clinic NP (Jessica Vanschaick or Darrol Angel, if both not available, consider Manson Allan, or [facility]) at [facility] in about 4 weeks. Thanks.   Increase activity slowly   Complete by:  As directed      Allergies as of 12/20/2017      Reactions   Blue Dyes (parenteral) Shortness Of Breath, Rash   Sulfa Antibiotics Anaphylaxis   Ace Inhibitors Nausea And Vomiting   Norvasc [amlodipine] Nausea And Vomiting   Calcium Channel Blockers Diarrhea      Medication List    STOP taking these medications   aspirin EC 325 MG tablet Replaced by:  aspirin 325 MG tablet   meloxicam 15 MG tablet Commonly known as:  MOBIC     TAKE these medications   acetaminophen-codeine 300-30 MG tablet Commonly known as:  TYLENOL #3 Take 0.5 tablets by mouth daily as needed (severe headache).   aspirin 325 MG tablet Take 1 tablet (325 mg total) by mouth daily. Replaces:  aspirin EC 325 MG tablet   atenolol 50 MG tablet Commonly known as:  TENORMIN Take 100 mg by mouth daily.   clopidogrel 75 MG tablet Commonly known as:  PLAVIX Take 1 tablet (75 mg total)  by mouth daily.   ezetimibe 10 MG tablet Commonly known as:  ZETIA Take 1 tablet (10 mg total) by mouth daily.   levothyroxine 50 MCG tablet Commonly known as:  SYNTHROID, LEVOTHROID Take 100 mcg by mouth daily before breakfast.   lidocaine 5 % Commonly known as:  LIDODERM Place 1  patch onto the skin daily as needed (pain).   LORazepam 1 MG tablet Commonly known as:  ATIVAN Take 1 mg by mouth See admin instructions. Take 1 tablet (1 mg) by mouth twice daily, may take a 3rd tablet (1 mg) during the day as needed for SBP >170, may also take a 4th tablet during the day as needed for anxiety.   nitroGLYCERIN 0.4 MG SL tablet Commonly known as:  NITROSTAT Place 0.4 mg under the tongue daily as needed (SBP >180).      Allergies  Allergen Reactions  . Blue Dyes (Parenteral) Shortness Of Breath and Rash  . Sulfa Antibiotics Anaphylaxis  . Ace Inhibitors Nausea And Vomiting  . Norvasc [Amlodipine] Nausea And Vomiting  . Calcium Channel Blockers Diarrhea   Follow-up Information    [facility]. Schedule an appointment as soon as possible for a visit in 4 week(s).   Specialty:  Neurology Contact information: [address] [place] [address] [phone]       Philemon Kingdom, MD Follow up in 1 week(s).   Specialty:  Internal Medicine Contact information: [address] [place] [address] [phone]            The results of significant diagnostics from this hospitalization (including imaging, microbiology, ancillary and laboratory) are listed below for reference.    Significant Diagnostic Studies: Ct Angio Head W Or Wo Contrast  Result Date: 12/17/2017 CLINICAL DATA:  Acute infarct pons EXAM: CT ANGIOGRAPHY HEAD AND NECK TECHNIQUE: Multidetector CT imaging of the head and neck was performed using the standard protocol during bolus administration of intravenous contrast. Multiplanar CT image reconstructions and MIPs were obtained to evaluate the vascular anatomy. Carotid stenosis measurements (when applicable) are obtained utilizing NASCET criteria, using the distal internal carotid diameter as the denominator. CONTRAST:  50mL ISOVUE-370 IOPAMIDOL (ISOVUE-370) INJECTION 76% COMPARISON:  MRI head 12/17/2017  FINDINGS: CTA NECK FINDINGS Aortic arch: Mild atherosclerotic calcification aortic arch. Proximal great vessels are tortuous with mild atherosclerotic disease but widely patent. Right carotid system: Right common carotid artery widely patent and tortuous. Mild atherosclerotic calcification right carotid bifurcation without stenosis Left carotid system: Tortuosity left common carotid artery which is widely patent. Left carotid bifurcation widely patent with minimal calcification. Vertebral arteries: Both vertebral arteries are patent to the basilar. Stenosis distal left vertebral artery. Skeleton: No acute skeletal abnormality. Other neck: Negative for mass or adenopathy Upper chest: Negative Review of the MIP images confirms the above findings CTA HEAD FINDINGS Anterior circulation: Circumferential calcification right supraclinoid internal carotid artery with moderate stenosis. Mild stenosis left supraclinoid internal carotid artery. Both anterior middle cerebral arteries are patent without occlusion. Mild irregularity in the middle cerebral artery branches and anterior cerebral artery branches bilaterally due to atherosclerotic disease. Posterior circulation: Moderate stenosis distal left vertebral artery due to calcified plaque. Mild stenosis mid basilar which may account for the pontine infarct. Superior cerebellar and posterior cerebral arteries patent bilaterally. Mild atherosclerotic stenosis throughout the posterior cerebral artery bilaterally. Venous sinuses: Patent Anatomic variants: None Delayed phase: Normal enhancement on delayed imaging Review of the MIP images confirms the above findings IMPRESSION: 1. No significant carotid  or vertebral artery stenosis in the neck 2. 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 Moderate stenosis distal right internal carotid artery and mild stenosis distal left internal carotid artery. Diffuse intracranial atherosclerotic disease. 4. Negative for  emergent large vessel occlusion. 5. Mild stenosis mid basilar may account for the acute pontine infarct. Electronically Signed   By: Marlan Palau M.D.   On: 12/17/2017 19:15   Dg Chest 2 View  Result Date: 12/17/2017 CLINICAL DATA:  Slurred speech. EXAM: CHEST - 2 VIEW COMPARISON:  None. FINDINGS: Mild cardiomegaly. Normal pulmonary vascularity. Atherosclerotic calcification of the aortic arch. No focal consolidation, pleural effusion, or pneumothorax. No acute osseous abnormality. IMPRESSION: 1.  No active cardiopulmonary disease.  Mild cardiomegaly. 2.  Aortic atherosclerosis (ICD10-I70.0). Electronically Signed   By: Obie Dredge M.D.   On: 12/17/2017 19:30   Ct Head Wo Contrast  Result Date: 12/17/2017 CLINICAL DATA:  Slurred speech since yesterday. EXAM: CT HEAD WITHOUT CONTRAST TECHNIQUE: Contiguous axial images were obtained from the base of the skull through the vertex without intravenous contrast. COMPARISON:  None. FINDINGS: Brain: No evidence of acute infarction, hemorrhage, hydrocephalus, extra-axial collection or mass lesion/mass effect. Moderate brain parenchymal volume loss. Minimal deep white matter microangiopathy. Vascular: Calcific atherosclerotic disease at the skull base. Skull: Normal. Negative for fracture or focal lesion. Sinuses/Orbits: Polypoid mucosal thickening with chronic periosteal reaction of the left maxillary sinus, suggestive of chronic sinusitis. Milder polypoid mucosal thickening of the ethmoid sinuses. Other: None. IMPRESSION: No acute intracranial abnormality. Chronic left maxillary sinusitis.  Mild bilateral ethmoid sinusitis. Electronically Signed   By: Ted Mcalpine M.D.   On: 12/17/2017 10:47   Ct Angio Neck W Or Wo Contrast  Result Date: 12/17/2017 CLINICAL DATA:  Acute infarct pons EXAM: CT ANGIOGRAPHY HEAD AND NECK TECHNIQUE: Multidetector CT imaging of the head and neck was performed using the standard protocol during bolus administration of  intravenous contrast. Multiplanar CT image reconstructions and MIPs were obtained to evaluate the vascular anatomy. Carotid stenosis measurements (when applicable) are obtained utilizing NASCET criteria, using the distal internal carotid diameter as the denominator. CONTRAST:  50mL ISOVUE-370 IOPAMIDOL (ISOVUE-370) INJECTION 76% COMPARISON:  MRI head 12/17/2017 FINDINGS: CTA NECK FINDINGS Aortic arch: Mild atherosclerotic calcification aortic arch. Proximal great vessels are tortuous with mild atherosclerotic disease but widely patent. Right carotid system: Right common carotid artery widely patent and tortuous. Mild atherosclerotic calcification right carotid bifurcation without stenosis Left carotid system: Tortuosity left common carotid artery which is widely patent. Left carotid bifurcation widely patent with minimal calcification. Vertebral arteries: Both vertebral arteries are patent to the basilar. Stenosis distal left vertebral artery. Skeleton: No acute skeletal abnormality. Other neck: Negative for mass or adenopathy Upper chest: Negative Review of the MIP images confirms the above findings CTA HEAD FINDINGS Anterior circulation: Circumferential calcification right supraclinoid internal carotid artery with moderate stenosis. Mild stenosis left supraclinoid internal carotid artery. Both anterior middle cerebral arteries are patent without occlusion. Mild irregularity in the middle cerebral artery branches and anterior cerebral artery branches bilaterally due to atherosclerotic disease. Posterior circulation: Moderate stenosis distal left vertebral artery due to calcified plaque. Mild stenosis mid basilar which may account for the pontine infarct. Superior cerebellar and posterior cerebral arteries patent bilaterally. Mild atherosclerotic stenosis throughout the posterior cerebral artery bilaterally. Venous sinuses: Patent Anatomic variants: None Delayed phase: Normal enhancement on delayed imaging Review of  the MIP images confirms the above findings IMPRESSION: 1. No significant carotid or vertebral artery stenosis in the  neck 2. 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 Moderate stenosis distal right internal carotid artery and mild stenosis distal left internal carotid artery. Diffuse intracranial atherosclerotic disease. 4. Negative for emergent large vessel occlusion. 5. Mild stenosis mid basilar may account for the acute pontine infarct. Electronically Signed   By: Marlan Palau M.D.   On: 12/17/2017 19:15   Mr Brain Wo Contrast (neuro Protocol)  Result Date: 12/17/2017 CLINICAL DATA:  Focal neuro deficit, greater than 6 hours stroke suspected. Slurred speech beginning yesterday. EXAM: MRI HEAD WITHOUT CONTRAST TECHNIQUE: Multiplanar, multiecho pulse sequences of the brain and surrounding structures were obtained without intravenous contrast. COMPARISON:  CT head without contrast 12/17/2017 FINDINGS: Brain: Bilateral acute nonhemorrhagic infarcts are noted in the anterior pons. Right paramedian infarct slightly larger than the left. Associated T2 signal changes are evident. Mild white matter changes are otherwise within normal limits for age. Ventricles are of normal size. No significant extra-axial fluid collection is present. Vascular: Flow is present in the major intracranial arteries. Skull and upper cervical spine: The skull base is within normal limits. Craniocervical junction is normal. The upper cervical spine is within limits. Sinuses/Orbits: Mild mucosal thickening is present posteriorly in the left maxillary sinus. The remaining paranasal sinuses and the mastoid air cells are clear. Bilateral lens replacements are present. Globes and orbits are within normal limits. IMPRESSION: 1. 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 2. No evidence for other acute or remote infarcts. 3. Mild left maxillary sinus disease. Electronically Signed   By: Marin Roberts M.D.   On: 12/17/2017 16:25   Dg Swallowing Func-speech Pathology  Result Date: 12/18/2017 Objective Swallowing Evaluation: Type of Study: MBS-Modified Barium Swallow Study  Patient Details Name: Bethany Hirt MRN: [id number] Date of Birth: 01-Oct-1940 Today's Date: 12/18/2017 Time: SLP Start Time (ACUTE ONLY): 1230 -SLP Stop Time (ACUTE ONLY): 1252 SLP Time Calculation (min) (ACUTE ONLY): 22 min Past Medical History: Past Medical History: Diagnosis Date . Anxiety  . Arthritis  . Diabetes mellitus without complication (HCC)  . Hypertension  . Sinus problem  . Thyroid disease  Past Surgical History: Past Surgical History: Procedure Laterality Date . BREAST SURGERY   . EYE SURGERY   HPI: 77 y.o. female with medical history significant of HTN, Anxiety, DM, Hypothyroidism, Renal artery stenosis, was brought to the ED by her brother due to worsening speech difficulty. MRI was positive for acute bilateral pontine ischemic strokes.  No data recorded Assessment / Plan / Recommendation CHL IP CLINICAL IMPRESSIONS 12/18/2017 Clinical Impression Patient presents with a moderate oropharyngeal dysphagia. Orally, patient with mildly delayed A-P transit of bolus, intermttently decreased bolus cohesion, and mild oral residuals with premature spillage over the base of the tongue. Following, delayed swallow initiation combined with decreased laryngeal closure results in deep penetration with eventual aspiration of viscosities thinner than pudding thick. Attempted use of chin tuck which was inconsistently effective. Should be noted however that patient with some difficulty with carryout of strategy. SLP wonders if strategy would have been effective with liquid consistencies if patient had been able to consistently utilize. Mild vallecular residuals remain post swallow with solids due to base of tongue weakness increasing risk of post swallow aspiration,  although airway generally better protected. Recommend initiation of a  conservative diet, puree (dys 1) with pudding thick liquids with close SLP f/u.  SLP Visit Diagnosis Dysphagia, oropharyngeal phase (R13.12) Attention and concentration deficit following -- Frontal lobe and executive function deficit following -- Impact on safety and  function Severe aspiration risk   CHL IP TREATMENT RECOMMENDATION 12/18/2017 Treatment Recommendations Therapy as outlined in treatment plan below   Prognosis 12/18/2017 Prognosis for Safe Diet Advancement Good Barriers to Reach Goals -- Barriers/Prognosis Comment -- [facility]L IP DIET RECOMMENDATION 12/18/2017 SLP Diet Recommendations Dysphagia 1 (Puree) solids;Pudding thick liquid Liquid Administration via Spoon Medication Administration Crushed with puree Compensations Small sips/bites;Slow rate Postural Changes Seated upright at 90 degrees   CHL IP OTHER RECOMMENDATIONS 12/18/2017 Recommended Consults -- Oral Care Recommendations Oral care BID Other Recommendations Order thickener from pharmacy;Prohibited food (jello, ice cream, thin soups);Remove water pitcher   CHL IP FOLLOW UP RECOMMENDATIONS 12/18/2017 Follow up Recommendations Inpatient Rehab   CHL IP FREQUENCY AND DURATION 12/18/2017 Speech Therapy Frequency (ACUTE ONLY) min 3x week Treatment Duration 2 weeks      CHL IP ORAL PHASE 12/18/2017 Oral Phase Impaired Oral - Pudding Teaspoon -- Oral - Pudding Cup -- Oral - Honey Teaspoon Lingual/palatal residue;Delayed oral transit Oral - Honey Cup Lingual/palatal residue;Delayed oral transit Oral - Nectar Teaspoon Lingual/palatal residue;Delayed oral transit Oral - Nectar Cup Lingual/palatal residue;Delayed oral transit Oral - Nectar Straw -- Oral - Thin Teaspoon Lingual/palatal residue;Delayed oral transit Oral - Thin Cup Lingual/palatal residue;Delayed oral transit Oral - Thin Straw -- Oral - Puree Lingual/palatal residue;Delayed oral transit Oral - Mech Soft Lingual/palatal residue;Delayed oral transit Oral - Regular -- Oral - Multi-Consistency -- Oral - Pill  -- Oral Phase - Comment --  CHL IP PHARYNGEAL PHASE 12/18/2017 Pharyngeal Phase Impaired Pharyngeal- Pudding Teaspoon -- Pharyngeal -- Pharyngeal- Pudding Cup -- Pharyngeal -- Pharyngeal- Honey Teaspoon Delayed swallow initiation-vallecula;Penetration/Aspiration during swallow;Reduced airway/laryngeal closure Pharyngeal Material enters airway, CONTACTS cords and not ejected out;Material enters airway, passes BELOW cords without attempt by patient to eject out (silent aspiration) Pharyngeal- Honey Cup Delayed swallow initiation-pyriform sinuses;Reduced airway/laryngeal closure;Penetration/Aspiration during swallow Pharyngeal Material enters airway, passes BELOW cords without attempt by patient to eject out (silent aspiration);Material enters airway, CONTACTS cords and not ejected out Pharyngeal- Nectar Teaspoon Delayed swallow initiation-vallecula;Reduced airway/laryngeal closure;Penetration/Aspiration during swallow Pharyngeal Material enters airway, CONTACTS cords and then ejected out;Material enters airway, passes BELOW cords without attempt by patient to eject out (silent aspiration) Pharyngeal- Nectar Cup Delayed swallow initiation-pyriform sinuses;Reduced airway/laryngeal closure;Penetration/Aspiration during swallow Pharyngeal Material enters airway, CONTACTS cords and not ejected out;Material enters airway, passes BELOW cords without attempt by patient to eject out (silent aspiration) Pharyngeal- Nectar Straw -- Pharyngeal -- Pharyngeal- Thin Teaspoon Delayed swallow initiation-pyriform sinuses;Reduced airway/laryngeal closure;Penetration/Aspiration during swallow Pharyngeal Material enters airway, CONTACTS cords and not ejected out Pharyngeal- Thin Cup Delayed swallow initiation-pyriform sinuses;Reduced airway/laryngeal closure;Penetration/Aspiration during swallow Pharyngeal Material enters airway, passes BELOW cords and not ejected out despite cough attempt by patient Pharyngeal- Thin Straw -- Pharyngeal --  Pharyngeal- Puree Delayed swallow initiation-vallecula;Reduced tongue base retraction;Pharyngeal residue - valleculae Pharyngeal -- Pharyngeal- Mechanical Soft Delayed swallow initiation-vallecula;Reduced tongue base retraction;Pharyngeal residue - valleculae Pharyngeal -- Pharyngeal- Regular -- Pharyngeal -- Pharyngeal- Multi-consistency -- Pharyngeal -- Pharyngeal- Pill -- Pharyngeal -- Pharyngeal Comment --  CHL IP CERVICAL ESOPHAGEAL PHASE 12/18/2017 Cervical Esophageal Phase WFL Pudding Teaspoon -- Pudding Cup -- Honey Teaspoon -- Honey Cup -- Nectar Teaspoon -- Nectar Cup -- Nectar Straw -- Thin Teaspoon -- Thin Cup -- Thin Straw -- Puree -- Mechanical Soft -- Regular -- Multi-consistency -- Pill -- Cervical Esophageal Comment -- Ferdinand Lango MA, CCC-SLP McCoy Leah Meryl 12/18/2017, 1:13 PM              Mr Maxine Glenn Head Wo Contrast  Result  Date: 12/17/2017 CLINICAL DATA:  Acute pontine stroke EXAM: MRA HEAD WITHOUT CONTRAST TECHNIQUE: Angiographic images of the Circle of Willis were obtained using MRA technique without intravenous contrast. COMPARISON:  CT a head 12/17/2017, MRI head 12/17/2017 FINDINGS: Image quality degraded by significant motion. CTA head is of better quality. See those results. Negative for emergent large vessel occlusion. Mild stenosis mid pons. Atherosclerotic irregularity throughout the anterior cerebral arteries, posterior cerebral arteries, and middle cerebral arteries bilaterally. IMPRESSION: Study limited by motion.  See prior CTA results Electronically Signed   By: Marlan Palau M.D.   On: 12/17/2017 19:47    Microbiology: Recent Results (from the past 240 hour(s))  Urine culture     Status: None   Collection Time: 12/17/17  2:43 PM  Result Value Ref Range Status   Specimen Description URINE, CATHETERIZED  Final   Special Requests NONE  Final   Culture   Final    NO GROWTH Performed at [facility], [address], [place], [place] [address]    Report Status  12/18/2017 FINAL  Final     Labs: Basic Metabolic Panel: Recent Labs  Lab 12/17/17 0924 12/20/17 1538 12/21/17 0423  NA 138  --  144  K 3.4*  --  2.9*  CL 101  --  109  CO2 24  --  27  GLUCOSE 150*  --  186*  BUN 11  --  19  CREATININE 0.79 0.73 0.66  CALCIUM 9.7  --  9.5   Liver Function Tests: Recent Labs  Lab 12/17/17 0924 12/21/17 0423  AST 25 23  ALT 14 15  ALKPHOS 51 45  BILITOT 1.0 0.8  PROT 7.6 7.0  ALBUMIN 4.3 3.9   No results for input(s): LIPASE, AMYLASE in the last 168 hours. No results for input(s): AMMONIA in the last 168 hours. CBC: Recent Labs  Lab 12/17/17 0924 12/20/17 1538 12/21/17 0423  WBC 7.9 9.0 7.4  NEUTROABS 5.5  --  3.8  HGB 14.3 13.1 13.4  HCT 43.0 40.6 40.3  MCV 84.0 82.7 83.6  PLT 323 326 293   Cardiac Enzymes: No results for input(s): CKTOTAL, CKMB, CKMBINDEX, TROPONINI in the last 168 hours. BNP: BNP (last 3 results) No results for input(s): BNP in the last 8760 hours.  ProBNP (last 3 results) No results for input(s): PROBNP in the last 8760 hours.  CBG: Recent Labs  Lab 12/17/17 0941  GLUCAP 135*       Signed:  Zannie Cove MD.  [facility] 12/21/2017, 11:06 AM

## 2017-12-21 NOTE — Care Management (Signed)
Inpatient Rehabilitation Center Individual Statement of Services  Patient Name:  Brandy Flowers  Date:  12/21/2017  Welcome to the Inpatient Rehabilitation Center.  Our goal is to provide you with an individualized program based on your diagnosis and situation, designed to meet your specific needs.  With this comprehensive rehabilitation program, you will be expected to participate in at least 3 hours of rehabilitation therapies Monday-Friday, with modified therapy programming on the weekends.  Your rehabilitation program will include the following services:  Physical Therapy (PT), Occupational Therapy (OT), Speech Therapy (ST), 24 hour per day rehabilitation nursing, Therapeutic Recreaction (TR), Neuropsychology, Case Management (Social Worker), Rehabilitation Medicine, Nutrition Services and Pharmacy Services  Weekly team conferences will be held on Wednesday to discuss your progress.  Your Social Worker will talk with you frequently to get your input and to update you on team discussions.  Team conferences with you and your family in attendance may also be held.  Expected length of stay: 14-21 days  Overall anticipated outcome: supervision with cueing  Depending on your progress and recovery, your program may change. Your Social Worker will coordinate services and will keep you informed of any changes. Your Social Worker's name and contact numbers are listed  below.  The following services may also be recommended but are not provided by the Inpatient Rehabilitation Center:   Driving Evaluations  Home Health Rehabiltiation Services  Outpatient Rehabilitation Services  Vocational Rehabilitation   Arrangements will be made to provide these services after discharge if needed.  Arrangements include referral to agencies that provide these services.  Your insurance has been verified to be:  Medicare & Fed BCBS Your primary doctor is:  Raliegh Ip  Pertinent information will be shared  with your doctor and your insurance company.  Social Worker:  Dossie Der, [phone] or ([phone]  Information discussed with and copy given to patient by: [name], 12/21/2017, 3:12 PM

## 2017-12-21 NOTE — Progress Notes (Signed)
Inpatient Rehabilitation  Patient information reviewed and entered into eRehab system by Ariatna Jester M. Amiya Escamilla, M.A., CCC/SLP, PPS Coordinator.  Information including medical coding, functional ability and quality indicators will be reviewed and updated through discharge.    Per nursing patient was given "Data Collection Information Summary" for Patients in Inpatient Rehabilitation Facilities with attached "Privacy Act Statement-Health Care Records" upon admission.   

## 2017-12-21 NOTE — Progress Notes (Signed)
Social Work  Social Work Assessment and Plan  Patient Details  Name: Brandy Flowers MRN: [id number] Date of Birth: 05/17/1940  Today's Date: 12/21/2017  Problem List:  Patient Active Problem List   Diagnosis Date Noted  . 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 Oropharyngeal dysphagia   . Slurred speech   . Diastolic dysfunction   . Dysphagia, post-stroke   . Hypokalemia   . Stroke (cerebrum) (HCC) 12/17/2017  . Anxiety 12/17/2017  . 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 Left renal artery stenosis (HCC) 12/05/2014  . 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 Hypertension 09/20/2013  . Hypothyroid 09/20/2013   Past Medical History:  Past Medical History:  Diagnosis Date  . Anxiety   . Arthritis   . Diabetes mellitus without complication (HCC)   . Hypertension   . Sinus problem   . Thyroid disease    Past Surgical History:  Past Surgical History:  Procedure Laterality Date  . BREAST SURGERY    . EYE SURGERY     Social History:  reports that she has never smoked. She has never used smokeless tobacco. She reports that she does not drink alcohol or use drugs.  Family / Support Systems Marital Status: Widow/Widower Patient Roles: Parent, Other (Comment)(sibling & employee) Children: Son in [place] Other Supports: David-Chip-Anderson-brother [phone]-cell Has another borther also Anticipated Caregiver: Brother and friends Debroah Loop and Rose Ability/Limitations of Caregiver: Chip is an Charity fundraiser in the Cath lab, both of her friends can help also-not sure if realizes possible 24 hr physical care Caregiver Availability: Other (Comment)(coming up with a plan awaiting goals) Family Dynamics: Close with her two brothers and son, only Chip is local and willing to help her. She has a few friends who are involved and will assist, just how much unsure. Pt will need to confirm when goals set and care needs made  Social History Preferred  language: English Religion:  Cultural Background: No issues Education: Automotive engineer educated Read: Yes Write: Yes Employment Status: Employed Name of Employer: [employer] the [employer] Return to Work Plans: Depends probably will retire now Fish farm manager Issues: no issues Guardian/Conservator: None-according to MD pt is capable of mkaing her own decisions while here. She does want her brother-Chip included. He visits her daily   Abuse/Neglect Abuse/Neglect Assessment Can Be Completed: Yes Physical Abuse: Denies Verbal Abuse: Denies Sexual Abuse: Denies Exploitation of patient/patient's resources: Denies Self-Neglect: Denies  Emotional Status Pt's affect, behavior adn adjustment status: Pt is motivated to do well but complains of being dizzy and not doing as well as yesterday. MD has ordered another CT scan to re-check this. Pt was independent prior to admission and plans to get there again from here. Recent Psychosocial Issues: other health issues this is the worse-last year had ankle fracture which took awhile to recover from Pyschiatric History: History of anxiety takes xanax at home and feels is not getting enough here. MD is aware of this. Do feel she would benefit from seeing neuro-psych while here for this issue Substance Abuse History: No issues  Patient / Family Perceptions, Expectations & Goals Pt/Family understanding of illness & functional limitations: Pt and brother can explain her stroke and deficits. Brother asked if she was walking today. Encouraged him to stay for therapies to see what she is doing and talk to the MD. Both seem to need educaiton regarding her condition will ask staff to assist with thi Premorbid pt/family roles/activities: Sister, employee, mom,  friend, etc Anticipated changes in roles/activities/participation: resume Pt/family expectations/goals: Pt states: " I need to get better I don't want to be like this, but I will have help at  home."  Brother states: " She needs to be walking when she leaves here."  [employer]: None Premorbid Home Care/DME Agencies: None Transportation available at discharge: family and friends Resource referrals recommended: Neuropsychology, Support group (specify)  Discharge Planning Living Arrangements: Alone Support Systems: Children, Other relatives, Manufacturing engineer, Psychologist, clinical community Type of Residence: Private residence Community education officer Resources: [employer], Media planner (specify)(Fed [employer]) Surveyor, quantity Resources: Employment, Restaurant manager, fast food Screen Referred: No Living Expenses: Own Money Management: Patient Does the patient have any problems obtaining your medications?: No Home Management: Self Patient/Family Preliminary Plans: Return home with borhter helping and two friends. She will need to confirm if they all can provide physical care and if can rotate for 24 hr. She feels she will be better than this and will be more mod/i level. Brother seems to be just learning her deficits. Sw Barriers to Discharge: Decreased caregiver support Sw Barriers to Discharge Comments: Not confirmed 24 hr care as of now Social Work Anticipated Follow Up Needs: HH/OP, Support Group  Clinical Impression Pleasant female who is not having a good first day, she feels she is not doing as well and MD has ordered an CT scan to check. Pt's brother is here and willing to assist but doesn't realize how much he may need to but also works. Asked pt to confirm a plan for discharge with friends and see how much they can assist with this. Will work on realistic plan and have neuro-psych see for coping and anxiety issues. Await therapy evaluations.  [name] 12/21/2017, 3:03 PM

## 2017-12-21 NOTE — Progress Notes (Signed)
Discussed MRI results with Dr. Roda Shutters. He evaluated films and feels that enlargement likely due to completion of stroke. He recommends avoid hypo or hypertension and narrowing SBP goal to 130-160 range as well as hydration. Will adjust prn catapres accordingly and add IVF. Patient on pudding liquids--doubt ability to maintain adequate hydration on this with variable intake.

## 2017-12-21 NOTE — Progress Notes (Signed)
Physical Therapy Session Note  Patient Details  Name: Brandy Flowers MRN: [id number] Date of Birth: [birth year]  Today's Date: 12/21/2017 PT Individual Time: 2637-8588 PT Individual Time Calculation (min): 30 min   Short Term Goals: Week 1:  PT Short Term Goal 1 (Week 1): Patient to tolerate sitting at EOB for at least 10 minutes to allow for improved participation in activities  PT Short Term Goal 2 (Week 1): Patient to transfer to/from [facility] with MinA  PT Short Term Goal 3 (Week 1): Patient to be able to ambulate at least 63f with LRAD, MinA   Skilled Therapeutic Interventions/Progress Updates:  Patient received in bed with brother present, both very pleasant and brother very encouraging of participation in PT. She reports feeling better this afternoon. Able to perform bed mobility with ModA, and sit to stand with MinA and RW but very unsteady on feet with heavy use of UEs for support due to reduced strength in B LEs, knees appeared to come close to buckling multiple times in static standing but patient able to maintain upright with Min guard, B UE support on RW, and Mod cues for hip extension and upright posture. Able to stand for no more than 2 minutes due to return of dizziness. Participated in seated LAQs with 1.5# at EOB, cues to avoid trunk extension compensation. She became fatigued and dizzy and was returned to bed with ModA, able to scoot self up in bed and roll for repositioning of chuck with S. She was left in bed with all needs met and bed alarm active.   BP measures during session as follows:  Supine 196/60, 53BPM Seated 198/61, 57BPM Standing 172/158 (unsure of measurement accuracy due to patient being very anxious and tense in standing, also returned to sitting midway through  Getting BP reading) Immediately after return to sitting 173/53  Therapy Documentation Precautions:  Precautions Precautions: Fall, Other (comment) Precaution Comments: watch BP, dizziness   Restrictions Weight Bearing Restrictions: No General: Chart Reviewed: Yes Response to Previous Treatment: Patient reporting fatigue but able to participate. Family/Caregiver Present: No Pain: Pain Assessment Pain Scale: 0-10 Pain Score: 0-No pain Patients Stated Pain Goal: 0 Multiple Pain Sites: No   Therapy/Group: Individual Therapy  KDeniece ReePT, DPT, CBIS  Supplemental Physical Therapist [facility]   Pager [phone] Rehab Office [phone]  12/21/2017, 2:35 PM

## 2017-12-21 NOTE — Progress Notes (Signed)
Delle Reining [facility] was notified about MRI results,orders were received.Keep monitoring pt. Closely.

## 2017-12-21 NOTE — Evaluation (Signed)
Occupational Therapy Assessment and Plan  Patient Details  Name: Fynlee Rowlands MRN: [id number] Date of Birth: 1940/03/07  OT Diagnosis: cognitive deficits and muscle weakness (generalized) Rehab Potential: Rehab Potential (ACUTE ONLY): Good ELOS: 15-19 days   Today's Date: 12/21/2017 OT Individual Time: 5188-4166 OT Individual Time Calculation (min): 60 min     Problem List:  Patient Active Problem List   Diagnosis Date Noted  . 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 ([place]) 12/20/2017  . Oropharyngeal dysphagia   . Slurred speech   . Diastolic dysfunction   . Dysphagia, post-stroke   . Hypokalemia   . Stroke (cerebrum) ([place]) 12/17/2017  . Anxiety 12/17/2017  . Splenic artery aneurysm ([place]) 12/05/2014  . Left renal artery stenosis ([place]) 12/05/2014  . Type 2 diabetes, controlled, with neuropathy ([place]) 09/20/2013  . Hypertension 09/20/2013  . Hypothyroid 09/20/2013    Past Medical History:  Past Medical History:  Diagnosis Date  . Anxiety   . Arthritis   . Diabetes mellitus without complication ([place])   . Hypertension   . Sinus problem   . Thyroid disease    Past Surgical History:  Past Surgical History:  Procedure Laterality Date  . BREAST SURGERY    . EYE SURGERY      Assessment & Plan Clinical Impression: Lena Gores is a 77 year old right handed female history of hypertension, renal artery stenosis and diabetes mellitus. Per chart review and patient, patient lives alone. Independent prior to admission. One level home with 2 steps to entry. She has a brother who is a Marine scientist and states she will be living with him post discharge. Patient presented 12/17/2017 with slurred speech and altered mental status. Cranial CT scan reviewed, unremarkable for acute intracranial process. 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 Patient did not receive TPA. CT angiogram of head and neck with no significant carotid or vertebral artery stenosis.  Negative for emergent large vessel occlusion. Echocardiogram with ejection fraction of 06% grade 1 diastolic dysfunction. No wall motion abnormalities. Maintained on aspirin and Plavix 3 months and Plavix alone. Subcutaneous Lovenox for DVT prophylaxis.Dysphagia #1 pudding thick liquid diet. Therapy evaluations completed with recommendations of physical medicine rehabilitation consult. Patient was admitted for a comprehensive rehabilitation program.   Patient transferred to CIR on 12/20/2017 .    Patient currently requires total with basic self-care skills secondary to muscle weakness, decreased cardiorespiratoy endurance, decreased awareness and decreased safety awareness, central origin and decreased sitting balance, decreased standing balance, decreased postural control and decreased balance strategies.  Prior to hospitalization, patient was fully independent, lived alone, and volunteered.   Patient will benefit from skilled intervention to increase independence with basic self-care skills prior to discharge home with care partner.  Anticipate patient will require intermittent supervision and follow up home health.  OT - End of Session Activity Tolerance: Tolerates < 10 min activity with changes in vital signs(elevated blood pressure) Endurance Deficit: Yes Endurance Deficit Description: due to dizziness, pt only able to tolerate sitting EOB for 1 min, 2x OT Assessment Rehab Potential (ACUTE ONLY): Good OT Barriers to Discharge: Decreased caregiver support OT Barriers to Discharge Comments: Pt's brother will live with her, but he works 3 12 hr shifts. Pt states she will have friends assist on those days. OT Patient demonstrates impairments in the following area(s): Balance;Endurance;Motor;Safety;Cognition OT Basic ADL's Functional Problem(s): Eating;Grooming;Bathing;Dressing;Toileting OT Advanced ADL's Functional Problem(s): Simple Meal Preparation OT Transfers Functional Problem(s):  Toilet;Tub/Shower OT Additional Impairment(s): Fuctional Use of Upper Extremity OT Plan OT Intensity: Minimum of  1-2 x/day, 45 to 90 minutes OT Frequency: 5 out of 7 days OT Duration/Estimated Length of Stay: 15-19 days OT Treatment/Interventions: Balance/vestibular training;Discharge planning;Cognitive remediation/compensation;DME/adaptive equipment instruction;Functional mobility training;Psychosocial support;Patient/family education;Neuromuscular re-education;Self Care/advanced ADL retraining;Therapeutic Activities;UE/LE Coordination activities;UE/LE Strength taining/ROM;Therapeutic Exercise OT Self Feeding Anticipated Outcome(s): Independent OT Basic Self-Care Anticipated Outcome(s): Mod I OT Toileting Anticipated Outcome(s): Mod I OT Bathroom Transfers Anticipated Outcome(s): supervision OT Recommendation Patient destination: Home Follow Up Recommendations: Home health OT Equipment Recommended: Tub/shower bench   Skilled Therapeutic Intervention Pt seen for initial evaluation this session, but pt was not able to participate well due to severe dizziness.  She was able to verbalize well about her situation and demonstrated good memory skills. Pt did attempt to sit EOB 2x but was only able to tolerate sitting for 1 min.  Bathing and dressing performed from bed level with max to total A as she was not able to engage well or use her UE sufficiently.  Pt appeared to be quite anxious and this made her engagement with physical activity challenging.  Due to limited assessment, LTGs for ADL transfers will be set at S, but may be able to be upgraded as pt progresses.  General self care goals set at [facility].   RN aware of blood pressure. Pt resting in bed with alarm set and all needs met.    OT Evaluation Precautions/Restrictions  Precautions Precautions: Fall;Other (comment) Precaution Comments: watch BP, dizziness  Restrictions Weight Bearing Restrictions: No    Vital Signs Therapy  Vitals Pulse Rate: 66 BP: (!) 184/69 Pain Pain Assessment Pain Scale: 0-10 Pain Score: 0-No pain Patients Stated Pain Goal: 0 Multiple Pain Sites: No Home Living/Prior Functioning Home Living Family/patient expects to be discharged to:: Private residence Living Arrangements: Alone Available Help at Discharge: Family, Friend(s), Available 24 hours/day Home Access: Stairs to enter Technical brewer of Steps: 3 Entrance Stairs-Rails: Right Home Layout: One level Bathroom Shower/Tub: Chiropodist: Standard Additional Comments: lives with her dog.  She is unable to tell me who will stay with her, but insists that she will have 24 hour physical assist, and that someone is taking care of that   Lives With: Alone Prior Function Level of Independence: Independent with basic ADLs, Independent with homemaking with ambulation, Independent with gait, Independent with transfers  Able to Take Stairs?: Yes Driving: Yes Vocation: Volunteer work Public house manager Requirements: [employer] Leisure: Hobbies-yes (Comment) Comments: gardening ADL ADL Grooming: Minimal cueing, Minimal assistance Where Assessed-Grooming: Bed level Upper Body Bathing: Maximal assistance Where Assessed-Upper Body Bathing: Bed level Lower Body Bathing: Dependent Where Assessed-Lower Body Bathing: Bed level Upper Body Dressing: Maximal assistance Where Assessed-Upper Body Dressing: Bed level Lower Body Dressing: Dependent Where Assessed-Lower Body Dressing: Bed level Toileting: Not assessed Toilet Transfer: Not assessed Tub/Shower Transfer: Not assessed Walk-In Shower Transfer: Not assessed Vision Baseline Vision/History: No visual deficits;Wears glasses Wears Glasses: Reading only Patient Visual Report: No change from baseline Vision Assessment?: Yes Eye Alignment: Within Functional Limits Ocular Range of Motion: Within Functional Limits Alignment/Gaze Preference: Within Defined  Limits Tracking/Visual Pursuits: Able to track stimulus in all quads without difficulty Saccades: Within functional limits Convergence: Within functional limits Visual Fields: No apparent deficits Perception  Perception: Within Functional Limits Praxis Praxis: Intact Cognition Overall Cognitive Status: Impaired/Different from baseline Arousal/Alertness: Awake/alert Orientation Level: Person;Place;Situation Person: Oriented Place: Oriented Situation: Oriented Year: 2019 Month: November Day of Week: Correct Memory: Appears intact Immediate Memory Recall: Sock;Blue;Bed Memory Recall: Sock;Blue;Bed Memory Recall Sock: Without Cue Memory  Recall Blue: Without Cue Memory Recall Bed: Without Cue Attention: Sustained Sustained Attention: Appears intact Awareness: Impaired Awareness Impairment: Intellectual impairment Problem Solving: Impaired Problem Solving Impairment: Functional complex Safety/Judgment: Impaired Sensation Sensation Light Touch: Appears Intact Hot/Cold: Not tested Proprioception: Not tested Stereognosis: Not tested Coordination Gross Motor Movements are Fluid and Coordinated: No Fine Motor Movements are Fluid and Coordinated: No Coordination and Movement Description: unable to assess fully, limited by weakness and dizziness  Finger Nose Finger Test: DNT at eval, will perform when dizziness has improved  Heel Shin Test: DNT at eval, will perform when dizziness has improved  Motor  Motor Motor: Within Functional Limits Motor - Skilled Clinical Observations: Generalized motor weakness; did not perform MMT due to inability to tolerate sitting EOB due to dizziness  Mobility  Bed Mobility Bed Mobility: Rolling Right;Rolling Left;Supine to Sit;Sit to Supine Rolling Right: Supervision/verbal cueing Rolling Left: Supervision/Verbal cueing Supine to Sit: Moderate Assistance - Patient 50-74% Sit to Supine: Minimal Assistance - Patient > 75%  Trunk/Postural  Assessment  Postural Control Postural Control: (unable to assses on eval as pt only tolerated sitting EOB for 1 minute at a time)  Balance Static Sitting Balance Static Sitting - Level of Assistance: 5: Stand by assistance(Pt only able to tolerate sitting EOB for 1 min at a time, unable to fully assess balance) Extremity/Trunk Assessment RUE Assessment RUE Assessment: Exceptions to [facility] Passive Range of Motion (PROM) Comments: WFL Active Range of Motion (AROM) Comments: sh flex 110 General Strength Comments: 2-/5 shoulder, 3+/ 5 distally LUE Assessment LUE Assessment: Exceptions to [facility] Passive Range of Motion (PROM) Comments: WFL Active Range of Motion (AROM) Comments: sh flex 150 General Strength Comments: 2-/5 shoulder, 3+/ 5 distally     Refer to Care Plan for Long Term Goals  Recommendations for other services: Neuropsych and Therapeutic Recreation  Stress management   Discharge Criteria: Patient will be discharged from OT if patient refuses treatment 3 consecutive times without medical reason, if treatment goals not met, if there is a change in medical status, if patient makes no progress towards goals or if patient is discharged from hospital.  The above assessment, treatment plan, treatment alternatives and goals were discussed and mutually agreed upon: by patient  [facility] 12/21/2017, 12:21 PM

## 2017-12-21 NOTE — Discharge Instructions (Signed)
Inpatient Rehab Discharge Instructions  Brandy Flowers Discharge date and time: No discharge date for patient encounter.   Activities/Precautions/ Functional Status: Activity: activity as tolerated Diet:  Wound Care: none needed Functional status:  ___ No restrictions     ___ Walk up steps independently ___ 24/7 supervision/assistance   ___ Walk up steps with assistance ___ Intermittent supervision/assistance  ___ Bathe/dress independently ___ Walk with walker     _x__ Bathe/dress with assistance ___ Walk Independently    ___ Shower independently ___ Walk with assistance    ___ Shower with assistance ___ No alcohol     ___ Return to work/school ________  Special Instructions:  Aspirin and Plavix 3 months then STROKE/TIA DISCHARGE INSTRUCTIONS SMOKING Cigarette smoking nearly doubles your risk of having a stroke & is the single most alterable risk factor  If you smoke or have smoked in the last 12 months, you are advised to quit smoking for your health.  Most of the excess cardiovascular risk related to smoking disappears within a year of stopping.  Ask you doctor about anti-smoking medications  [place] Quit Line: 1-800-QUIT NOW  Free Smoking Cessation Classes [phone]  CHOLESTEROL Know your levels; limit fat & cholesterol in your diet  Lipid Panel     Component Value Date/Time   CHOL 205 (H) 12/18/2017 0353   TRIG 124 12/18/2017 0353   HDL 41 12/18/2017 0353   CHOLHDL 5.0 12/18/2017 0353   VLDL 25 12/18/2017 0353   LDLCALC 139 (H) 12/18/2017 0353      Many patients benefit from treatment even if their cholesterol is at goal.  Goal: Total Cholesterol (CHOL) less than 160  Goal:  Triglycerides (TRIG) less than 150  Goal:  HDL greater than 40  Goal:  LDL (LDLCALC) less than 100   BLOOD PRESSURE American Stroke Association blood pressure target is less that 120/80 mm/Hg  Your discharge blood pressure is:  BP: (!) 165/61  Monitor your blood pressure  Limit your  salt and alcohol intake  Many individuals will require more than one medication for high blood pressure  DIABETES (A1c is a blood sugar average for last 3 months) Goal HGBA1c is under 7% (HBGA1c is blood sugar average for last 3 months)  Diabetes:   Lab Results  Component Value Date   HGBA1C 6.5 (H) 12/18/2017     Your HGBA1c can be lowered with medications, healthy diet, and exercise.  Check your blood sugar as directed by your physician  Call your physician if you experience unexplained or low blood sugars.  PHYSICAL ACTIVITY/REHABILITATION Goal is 30 minutes at least 4 days per week  Activity: Increase activity slowly, Therapies: Physical Therapy: Home Health Return to work:   Activity decreases your risk of heart attack and stroke and makes your heart stronger.  It helps control your weight and blood pressure; helps you relax and can improve your mood.  Participate in a regular exercise program.  Talk with your doctor about the best form of exercise for you (dancing, walking, swimming, cycling).  DIET/WEIGHT Goal is to maintain a healthy weight  Your discharge diet is:  Diet Order            DIET - DYS 1 Room service appropriate? Yes; Fluid consistency: Pudding Thick  Diet effective now              liquids Your height is:    Your current weight is: Weight: 58.2 kg Your Body Mass Index (BMI) is:  Following the type of diet specifically designed for you will help prevent another stroke.  Your goal weight range is:    Your goal Body Mass Index (BMI) is 19-24.  Healthy food habits can help reduce 3 risk factors for stroke:  High cholesterol, hypertension, and excess weight.  RESOURCES Stroke/Support Group:  Call [phone]   STROKE EDUCATION PROVIDED/REVIEWED AND GIVEN TO PATIENT Stroke warning signs and symptoms How to activate emergency medical system (call 911). Medications prescribed at discharge. Need for follow-up after discharge. Personal risk factors  for stroke. Pneumonia vaccine given:  Flu vaccine given:  My questions have been answered, the writing is legible, and I understand these instructions.  I will adhere to these goals & educational materials that have been provided to me after my discharge from the hospital.    Plavix alone  My questions have been answered and I understand these instructions. I will adhere to these goals and the provided educational materials after my discharge from the hospital.  Patient/Caregiver Signature _______________________________ Date __________  Clinician Signature _______________________________________ Date __________  Please bring this form and your medication list with you to all your follow-up doctor's appointments.

## 2017-12-22 ENCOUNTER — Inpatient Hospital Stay (HOSPITAL_COMMUNITY): Payer: Self-pay | Admitting: Physical Therapy

## 2017-12-22 ENCOUNTER — Encounter (HOSPITAL_COMMUNITY): Payer: Medicare Other

## 2017-12-22 ENCOUNTER — Inpatient Hospital Stay (HOSPITAL_COMMUNITY): Payer: Medicare Other | Admitting: Speech Pathology

## 2017-12-22 ENCOUNTER — Ambulatory Visit: Payer: Medicare Other | Admitting: Vascular Surgery

## 2017-12-22 ENCOUNTER — Inpatient Hospital Stay (HOSPITAL_COMMUNITY): Payer: Medicare Other

## 2017-12-22 DIAGNOSIS — E785 Hyperlipidemia, unspecified: Secondary | ICD-10-CM

## 2017-12-22 DIAGNOSIS — I651 Occlusion and stenosis of basilar artery: Secondary | ICD-10-CM

## 2017-12-22 LAB — BASIC METABOLIC PANEL
Anion gap: 8 (ref 5–15)
BUN: 12 mg/dL (ref 8–23)
CALCIUM: 9.1 mg/dL (ref 8.9–10.3)
CO2: 23 mmol/L (ref 22–32)
CREATININE: 0.61 mg/dL (ref 0.44–1.00)
Chloride: 108 mmol/L (ref 98–111)
GFR calc Af Amer: >60 mL/min (ref >60)
GFR calc non Af Amer: >60 mL/min (ref >60)
GLUCOSE: 149 mg/dL — AB (ref 70–99)
Potassium: 3.9 mmol/L (ref 3.5–5.1)
Sodium: 139 mmol/L (ref 135–145)

## 2017-12-22 MED ORDER — ONDANSETRON HCL 4 MG/2ML IJ SOLN
4.0000 mg | Freq: Three times a day (TID) | INTRAMUSCULAR | Status: DC | PRN
  Administered 2017-12-22 – 2017-12-25 (×2): 4 mg via INTRAVENOUS
  Filled 2017-12-22: qty 2

## 2017-12-22 MED ORDER — LOSARTAN POTASSIUM 50 MG PO TABS
25.0000 mg | ORAL_TABLET | Freq: Every day | ORAL | Status: DC
  Administered 2017-12-22 – 2017-12-31 (×10): 25 mg via ORAL
  Filled 2017-12-22 (×10): qty 1

## 2017-12-22 MED ORDER — HYDROCHLOROTHIAZIDE 12.5 MG PO CAPS: 12.5000 mg | ORAL_CAPSULE | Freq: Every day | ORAL | Status: DC

## 2017-12-22 MED ORDER — ONDANSETRON HCL 4 MG/2ML IJ SOLN
INTRAMUSCULAR | Status: AC
  Filled 2017-12-22: qty 2

## 2017-12-22 NOTE — Consult Note (Signed)
Referring Physician: Dr Wynn Banker    Reason for Consult: new stroke  HPI: Brandy Flowers is an 77 y.o. female with a history of anxiety, diabetes mellitus, RAS, hypertension, and thyroid disease who was initially admitted to [facility] on 12/17/2017 for evaluation of slurred speech.  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  There was no evidence of any previous or remote infarcts. 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 Overall the patient did well and she was eventually admitted to the inpatient rehabilitation center at [facility] on 12/20/2017 on aspirin 325 mg daily along with Plavix 75 mg daily for intracranial stenosis. Over the past several days the patient's blood pressures been running high as she has refused her blood pressure medications (pt denies refusing meds). Pt also complains of worsening dizziness and nausea. On 12/21/17 pt refused PT/OT due to dizziness and not feeling well. An MRI was performed which revealed a more prominent and enlarged brainstem infarct based on new surrounding pontine areas of restricted diffusion and increasing intensity of restricted diffusion within existing areas of recent infarction involving the bilateral pons.  The basilar artery was noted to be patent.  Neurology was asked to see the patient once again for recommendations. Pt was seen today in room. No family at bedside. She is lying in bed, still complains of dizziness and nausea.    Past Medical History Past Medical History:  Diagnosis Date  . Anxiety   . Arthritis   . Diabetes mellitus without complication (HCC)   . Hypertension   . Sinus problem   . Thyroid disease     Surgical History Past Surgical History:  Procedure Laterality Date  . BREAST SURGERY    . EYE SURGERY      Family History  Family History  Problem Relation Age of  Onset  . Stroke Mother   . Diabetes Mother   . Heart disease Mother        Before age 16  . Hypertension Mother   . Hyperlipidemia Mother   . Heart disease Father   . Heart attack Father   . Heart disease Brother        After age 109- CABG  . Hypertension Brother     Social History:   reports that she has never smoked. She has never used smokeless tobacco. She reports that she does not drink alcohol or use drugs.  Allergies:  Allergies  Allergen Reactions  . Blue Dyes (Parenteral) Shortness Of Breath and Rash  . Sulfa Antibiotics Anaphylaxis  . Ace Inhibitors Nausea And Vomiting  . Norvasc [Amlodipine] Nausea And Vomiting  . Calcium Channel Blockers Diarrhea    Home Medications:  Medications Prior to Admission  Medication Sig Dispense Refill  . acetaminophen-codeine (TYLENOL #3) 300-30 MG per tablet Take 0.5 tablets by mouth daily as needed (severe headache).     Marland Kitchen aspirin 325 MG tablet Take 1 tablet (325 mg total) by mouth daily.    Marland Kitchen atenolol (TENORMIN) 50 MG tablet Take 100 mg by mouth daily.     . clopidogrel (PLAVIX) 75 MG tablet Take 1 tablet (75 mg total) by mouth daily.    Marland Kitchen ezetimibe (ZETIA) 10 MG tablet Take 1 tablet (10 mg total) by mouth daily.    Marland Kitchen levothyroxine (SYNTHROID, LEVOTHROID) 50 MCG tablet Take 100 mcg by mouth daily before breakfast.     .  lidocaine (LIDODERM) 5 % Place 1 patch onto the skin daily as needed (pain).     . LORazepam (ATIVAN) 1 MG tablet Take 1 mg by mouth See admin instructions. Take 1 tablet (1 mg) by mouth twice daily, may take a 3rd tablet (1 mg) during the day as needed for SBP >170, may also take a 4th tablet during the day as needed for anxiety.  5  . nitroGLYCERIN (NITROSTAT) 0.4 MG SL tablet Place 0.4 mg under the tongue daily as needed (SBP >180).   57    Hospital Medications . aspirin  300 mg Rectal Daily   Or  . aspirin  325 mg Oral Daily  . atenolol  25 mg Oral Daily  . clopidogrel  75 mg Oral Daily  . enoxaparin  (LOVENOX) injection  40 mg Subcutaneous Q24H  . ezetimibe  10 mg Oral Daily  . hydrochlorothiazide  12.5 mg Oral Daily  . levothyroxine  100 mcg Oral Q0600  . ondansetron      . potassium chloride  40 mEq Oral BID    ROS:  History obtained from the patient and her brother.  General ROS: Positive for dizziness Psychological ROS: negative for - behavioral disorder, hallucinations, memory difficulties, mood swings or suicidal ideation Ophthalmic ROS: negative for - blurry vision, double vision, eye pain or loss of vision ENT ROS: negative for - epistaxis, nasal discharge, oral lesions, sore throat, tinnitus or vertigo Allergy and Immunology ROS: negative for - hives or itchy/watery eyes Hematological and Lymphatic ROS: negative for - bleeding problems, bruising or swollen lymph nodes Endocrine ROS: negative for - galactorrhea, hair pattern changes, polydipsia/polyuria or temperature intolerance Respiratory ROS: negative for - cough, hemoptysis, shortness of breath or wheezing Cardiovascular ROS: negative for - chest pain, dyspnea on exertion, edema or irregular heartbeat Gastrointestinal ROS: negative for - abdominal pain, diarrhea, hematemesis, nausea/vomiting or stool incontinence. Positive for nausea. Genito-Urinary ROS: negative for - dysuria, hematuria, incontinence or urinary frequency/urgency Musculoskeletal ROS: negative for - joint swelling or muscular weakness Neurological ROS: as noted in HPI Dermatological ROS: negative for rash and skin lesion changes   Physical Examination:  Vitals:   12/22/17 0120 12/22/17 0227 12/22/17 0516 12/22/17 0633  BP: (!) 198/72 (!) 198/76 (!) 185/68 (!) 192/67  Pulse: 60  (!) 49 (!) 52  Resp:   18 18  Temp:   (!) 97.4 F (36.3 C) 97.7 F (36.5 C)  TempSrc:    Oral  SpO2:   98% 98%  Weight:      Height:           Neurologic Examination: Mental Status: Alert, oriented, thought content appropriate. Mild dysarthria. Able to follow 3  step commands without difficulty. Cranial Nerves: II: Discs not visualized; Visual fields grossly normal, pupils equal, round, reactive to light III,IV, VI: ptosis not present, extra-ocular motions intact bilaterally V,VII: smile symmetric, facial light touch sensation normal bilaterally VIII: hearing normal bilaterally IX,X: gag reflex present XI: bilateral shoulder shrug XII: midline tongue extension Motor: RUE - 4/5    LUE - 4/5   RLE - 5/5    LLE - 5/5 Tone and bulk:normal tone throughout; no atrophy noted Sensory: Light touch intact throughout, bilaterally Deep Tendon Reflexes: 2+ and symmetric throughout Plantars: Right: equivical  Left: equivical Cerebellar: normal finger-to-nose and heel to shin - mild difficulty using left leg. Gait: deferred at this time.   LABORATORY STUDIES:  Basic Metabolic Panel: Recent Labs  Lab 12/17/17 0924 12/20/17 1538 12/21/17 0423  12/22/17 0520  NA 138  --  144 139  K 3.4*  --  2.9* 3.9  CL 101  --  109 108  CO2 24  --  27 23  GLUCOSE 150*  --  186* 149*  BUN 11  --  19 12  CREATININE 0.79 0.73 0.66 0.61  CALCIUM 9.7  --  9.5 9.1    Liver Function Tests: Recent Labs  Lab 12/17/17 0924 12/21/17 0423  AST 25 23  ALT 14 15  ALKPHOS 51 45  BILITOT 1.0 0.8  PROT 7.6 7.0  ALBUMIN 4.3 3.9   No results for input(s): LIPASE, AMYLASE in the last 168 hours. No results for input(s): AMMONIA in the last 168 hours.  CBC: Recent Labs  Lab 12/17/17 0924 12/20/17 1538 12/21/17 0423  WBC 7.9 9.0 7.4  NEUTROABS 5.5  --  3.8  HGB 14.3 13.1 13.4  HCT 43.0 40.6 40.3  MCV 84.0 82.7 83.6  PLT 323 326 293    Cardiac Enzymes: No results for input(s): CKTOTAL, CKMB, CKMBINDEX, TROPONINI in the last 168 hours.  BNP: Invalid input(s): POCBNP  CBG: Recent Labs  Lab 12/17/17 0941  GLUCAP 135*    Microbiology:   Coagulation Studies: No results for input(s): LABPROT, INR in the last 72 hours.  Urinalysis:  Recent Labs   Lab 12/17/17 1440  COLORURINE YELLOW  LABSPEC 1.021  PHURINE 5.0  GLUCOSEU NEGATIVE  HGBUR NEGATIVE  BILIRUBINUR NEGATIVE  KETONESUR 80*  PROTEINUR 30*  NITRITE NEGATIVE  LEUKOCYTESUR NEGATIVE    Lipid Panel:     Component Value Date/Time   CHOL 205 (H) 12/18/2017 0353   TRIG 124 12/18/2017 0353   HDL 41 12/18/2017 0353   CHOLHDL 5.0 12/18/2017 0353   VLDL 25 12/18/2017 0353   LDLCALC 139 (H) 12/18/2017 0353    HgbA1C:  Lab Results  Component Value Date   HGBA1C 6.5 (H) 12/18/2017    Urine Drug Screen:      Component Value Date/Time   LABOPIA NONE DETECTED 12/17/2017 1440   COCAINSCRNUR NONE DETECTED 12/17/2017 1440   LABBENZ POSITIVE (A) 12/17/2017 1440   AMPHETMU NONE DETECTED 12/17/2017 1440   THCU NONE DETECTED 12/17/2017 1440   LABBARB NONE DETECTED 12/17/2017 1440     Alcohol Level:  Recent Labs  Lab 12/17/17 1000  ETH <10    Miscellaneous labs:  EKG  EKG -normal sinus rhythm rate 63 bpm with possible old septal MI.  Please refer to cardiology interpretation.  Head to stay frontal summary page me I think is broke   IMAGING:  Mr Brain Wo Contrast 12/21/2017 IMPRESSION:  Worsening brainstem infarction based on new surrounding pontine areas of restricted diffusion, and increasing intensity of restricted diffusion within existing areas of recent infarction, involving the BILATERAL pons, see discussion above. No hemorrhage. Basilar artery remains patent based on flow void signal.    CTA H&N 12/17/2017 IMPRESSION: 1. No significant carotid or vertebral artery stenosis in the neck 2. 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 Moderate stenosis distal right internal carotid artery and mild stenosis distal left internal carotid artery. Diffuse intracranial atherosclerotic disease. 4. Negative for emergent large vessel occlusion. 5. Mild stenosis mid basilar may account for the acute pontine infarct.   Assessment: 77 y.o. female  with a history of anxiety, diabetes mellitus, RAS, hypertension, and thyroid disease who was initially admitted to [facility] on 12/17/2017 for evaluation of slurred speech and found to have acute to subacute bilateral  anterior pontine nonhemorrhagic infarcts. Admitted to [facility] rehabilitation found to have new areas of infarct involving the bilateral pons with worsening dizziness and nausea. Neuro exam no significant focal deficit. Her worsening symptoms could be due to slight enlargement of pontine infarcts in the setting of dehydration and uncontrolled hypertension. On the other hand, the prominent stroke seen on the second MRI could be simply due to the timing of MRI and the symptoms could be just related to surrounding edema instead of new infarcts. We will recommend hydration and gradually normalize BP in 2-3 days, continue DAPT for 3 months and then plavix alone. Aggressive PT/OT. Discussed with pt for working together with PT/OT for best outcome potential. Pt expressed understanding.   Plan: - continue DAPT for 3 months and then plavix alone - hydration  - gradually normalize BP in 2-3 days - aggressive PT/OT - continue zetia and outpt follow up with PCP to consider PCSK9 inhibitors if needed - Neurology will sign off. Please call with questions. Pt will follow up with stroke clinic NP at [facility] in about 4 weeks. Thanks for the consult.  Marvel Plan, MD PhD Stroke Neurology 12/22/2017 11:14 PM

## 2017-12-22 NOTE — Progress Notes (Addendum)
Occupational Therapy Note  Patient Details  Name: Brandy Flowers MRN: [id number] Date of Birth: 1940-12-27  Today's Date: 12/22/2017 OT Missed Time: 75 Minutes Missed Time Reason: Other (comment)(dizziness)  Pt missed 75 mins skilled OT services.  Pt resting in bed upon arrival.  Pt declined therapy this morning secondary to ongoing dizziness.     Lavone Neri [facility] 12/22/2017, 12:06 PM

## 2017-12-22 NOTE — Progress Notes (Signed)
Pt is alert and oriented x 4. Pt's current blood pressure is 198/76 manual after administering Clonidine PO. Pt states she does not want to take anymore blood pressure medications. Pt educated on the benefits of taking blood pressure medication and the risk of not taking blood pressure medication to get her systolic blood pressure < 160 per doctors orders . Pt states" I do not want to take anymore blood pressure medications this morning, I want to sleep, if something happens to me or if I die, you are not liable."

## 2017-12-22 NOTE — Progress Notes (Signed)
Speech Language Pathology Daily Session Note  Patient Details  Name: Brandy Flowers MRN: [id number] Date of Birth: 1940-03-23  Today's Date: 12/22/2017 SLP Individual Time: 4098-1191 SLP Individual Time Calculation (min): 40 min  Short Term Goals: Week 1: SLP Short Term Goal 1 (Week 1): Patient will utilize speech intelligibility strategies at the conversation level with Min A verbal cues to achieve ~90% intelligibility.  SLP Short Term Goal 2 (Week 1): Patient will consume current diet with minimal overt s/s of aspiration with supervision verbal cues.  SLP Short Term Goal 3 (Week 1): Patient will consume trials of ice chips with minimal overt s/s of aspiration in 50% of trials with Min A verbal cues to assess readiness for repeat MBS.  SLP Short Term Goal 4 (Week 1): Patient will perform pharyngeal strengthening exercises with Mod A verbal cues.  SLP Short Term Goal 5 (Week 1): Patient will demonstrate functional problem solving for mildly complex tasks with supervision verbal cues.  SLP Short Term Goal 6 (Week 1): Patient will identify 2 impairments that put her at a high aspiration risk with Min A verbal cues.   Skilled Therapeutic Interventions: Upon arrival, patient was awake and and agreeable to participate in treatment session. This was SLPs second attempt to see patient due to dizziness and nausea. Skilled treatment session focused on speech and dysphagia goals. Patient was ~90% intelligible at the sentence level with supervision verbal cues and consumed minimal amounts of lunch meal of Dys. 1 textures with pudding thick liquids with overt cough X 1, suspect due to delayed swallow initiation.  Patient with intermittent attempts to talk with a full oral cavity requiring verbal cues for safety. Recommend patient continue current diet. Patient left upright in bed with alarm on and all needs within reach. Continue with current plan ofcare.      Pain Pain Assessment Pain Scale: 0-10 Pain  Score: 2  Pain Type: Acute pain Pain Location: Head Pain Orientation: Right;Left;Anterior Pain Descriptors / Indicators: Headache Pain Frequency: Intermittent Pain Onset: On-going Patients Stated Pain Goal: 2 Pain Intervention(s): Medication (See eMAR)  Therapy/Group: Individual Therapy  Mohd Clemons 12/22/2017, 1:41 PM

## 2017-12-22 NOTE — Progress Notes (Signed)
[place] PHYSICAL MEDICINE & REHABILITATION PROGRESS NOTE   Subjective/Complaints: Reviewed MRI and neuro recs Multiple drug intolerance  Also c/o nausea and dizziness ROS- no CP, SOB, N/V/D Objective:   Mr Brain Wo Contrast  Result Date: 12/21/2017 CLINICAL DATA:  Increasing dizziness. Recent brainstem infarct with upper and lower extremity weakness. EXAM: MRI HEAD WITHOUT CONTRAST TECHNIQUE: Multiplanar, multiecho pulse sequences of the brain and surrounding structures were obtained without intravenous contrast. COMPARISON:  MRI brain 12/17/2017. FINDINGS: The patient was unable to remain motionless for the exam. Small or subtle lesions could be overlooked. Brain: On diffusion-weighted imaging, there is increased extent, and increased intensity of restriction, with corresponding low ADC, of the previously identified mid pontine infarcts. There is worsening BILATERAL paramedian acute infarction, greater on the RIGHT, which increasingly involves the ventral aspect of the pons. See series 4 images 10-14. Mild atrophy. Minor white matter disease. No significant hemorrhage. Vascular: Patency of the basilar artery is established based on flow void signal. Skull and upper cervical spine: Normal marrow signal. Partial empty sella. Sinuses/Orbits: No significant sinus opacity or layering fluid. Negative orbits. Other: None IMPRESSION: Worsening brainstem infarction based on new surrounding pontine areas of restricted diffusion, and increasing intensity of restricted diffusion within existing areas of recent infarction, involving the BILATERAL pons, see discussion above. No hemorrhage. Basilar artery remains patent based on flow void signal. Electronically Signed   By: Elsie Stain M.D.   On: 12/21/2017 16:36   Recent Labs    12/20/17 1538 12/21/17 0423  WBC 9.0 7.4  HGB 13.1 13.4  HCT 40.6 40.3  PLT 326 293   Recent Labs    12/21/17 0423 12/22/17 0520  NA 144 139  K 2.9* 3.9  CL 109 108   CO2 27 23  GLUCOSE 186* 149*  BUN 19 12  CREATININE 0.66 0.61  CALCIUM 9.5 9.1    Intake/Output Summary (Last 24 hours) at 12/22/2017 0921 Last data filed at 12/22/2017 0645 Gross per 24 hour  Intake 1281.61 ml  Output 800 ml  Net 481.61 ml     Physical Exam: Vital Signs Blood pressure (!) 192/67, pulse (!) 52, temperature 97.7 F (36.5 C), temperature source Oral, resp. rate 18, height 5\' 2"  (1.575 m), weight 58.2 kg, SpO2 98 %.   General: No acute distress Mood and affect are appropriate Heart: Regular rate and rhythm no rubs murmurs or extra sounds Lungs: Clear to auscultation, breathing unlabored, no rales or wheezes Abdomen: Positive bowel sounds, soft nontender to palpation, nondistended Extremities: No clubbing, cyanosis, or edema Skin: No evidence of breakdown, no evidence of rash Neurologic: Cranial nerves II through XII intact, motor strength is 4/5 in bilateral deltoid, bicep, tricep, grip, hip flexor, knee extensors, ankle dorsiflexor and plantar flexor Sensory exam normal sensation to light touch  in bilateral upper and lower extremities Cerebellar exam mild right  and moderate left l finger to nose to finger , min Right and mild left heel to shin dysmetria Musculoskeletal: Full range of motion in all 4 extremities. No joint swelling   Assessment/Plan: 1. 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  Physiatrist is providing close team supervision and 24 hour management of active medical problems listed below.  Physiatrist and rehab team continue to assess barriers to discharge/monitor patient progress toward functional and medical goals  Care Tool:  Bathing  Bathing assist Assist Level: Total Assistance - Patient < 25%     Upper Body Dressing/Undressing Upper body dressing   What is the patient wearing?: Pull over shirt     Upper body assist Assist Level: Maximal Assistance - Patient 25 - 49%    Lower Body Dressing/Undressing Lower body dressing      What is the patient wearing?: Underwear/pull up, Pants     Lower body assist Assist for lower body dressing: Total Assistance - Patient < 25%     Toileting Toileting    Toileting assist Assist for toileting: Moderate Assistance - Patient 50 - 74%     Transfers Chair/bed transfer  Transfers assist  Chair/bed transfer activity did not occur: Safety/medical concerns(dizziness/BP )        Locomotion Ambulation   Ambulation assist   Ambulation activity did not occur: Safety/medical concerns(dizziness/BP )          Walk 10 feet activity   Assist  Walk 10 feet activity did not occur: Safety/medical concerns(dizziness/BP )        Walk 50 feet activity   Assist Walk 50 feet with 2 turns activity did not occur: Safety/medical concerns(dizziness/BP )         Walk 150 feet activity   Assist Walk 150 feet activity did not occur: Safety/medical concerns(dizziness/BP )         Walk 10 feet on uneven surface  activity   Assist Walk 10 feet on uneven surfaces activity did not occur: Safety/medical concerns(dizziness/BP)         Wheelchair     Assist Will patient use wheelchair at discharge?: (TBD ) Type of Wheelchair: (TBD) Wheelchair activity did not occur: Safety/medical concerns(dizziness/BP)         Wheelchair 50 feet with 2 turns activity    Assist    Wheelchair 50 feet with 2 turns activity did not occur: Safety/medical concerns(dizziness/BP)       Wheelchair 150 feet activity     Assist Wheelchair 150 feet activity did not occur: Safety/medical concerns(dizziness/BP)          Medical Problem List and Plan: 1.Decreased functional mobility with dysarthria/dysphagiasecondary to 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 DVT  Prophylaxis/Anticoagulation: subcutaneous Lovenox. Monitor for any bleeding episodes 3. Pain Management:Tylenol as needed 4. Mood:Ativan 1 mg 3 times a day as needed 5. Neuropsych: This patientiscapable of making decisions on herown behalf. 6. Skin/Wound Care:routine skin checks 7. Fluids/Electrolytes/Nutrition:Encourage PO intake.  -Routine in and out's with follow-up chemistries 8.Dysphagia. Dysphagia #1 pudding thick liquids. Monitor hydration. Follow-up speech therapy BUN ok 11/6 9.PermissiveHypertension.Tenormin 25 mg daily. Monitor with increased mobility Vitals:   12/22/17 0516 12/22/17 0633  BP: (!) 185/68 (!) 192/67  Pulse: (!) 49 (!) 52  Resp: 18 18  Temp: (!) 97.4 F (36.3 C) 97.7 F (36.5 C)  SpO2: 98% 98%  will tighten slighly with goal of 130-160 systolic but avoiding hypotension as well 10.Hypothyroidism. Synthroid 11. Hyperlipidemia. Zetia 12.Diet-controlled diabetes mellitus. Hemoglobin A1c 6.5. CBGs discontinued 13.  HypoK- no diuretic, likely nutritional add supplement LOS: 14.  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  Nausea likely due to CVA add Zofran prn, consider scopolamine patch 2 days A FACE TO FACE EVALUATION WAS PERFORMED  Erick Colace 12/22/2017, 9:21 AM

## 2017-12-22 NOTE — Progress Notes (Signed)
Dr. Wynn Banker made aware in reference to blood pressure trends and patients concerns about blood pressure and medication throughout the night. No new orders.

## 2017-12-22 NOTE — Progress Notes (Signed)
Physical Therapy Session Note  Patient Details  Name: Brandy Flowers MRN: [id number] Date of Birth: 10/28/1940  Today's Date: 12/22/2017 PT Individual Time: 1305-1350 PT Individual Time Calculation (min): 45 min  PT Missed time calculation (min): 15 min (fatigue, dizziness)  Short Term Goals: Week 1:  PT Short Term Goal 1 (Week 1): Patient to tolerate sitting at EOB for at least 10 minutes to allow for improved participation in activities  PT Short Term Goal 2 (Week 1): Patient to transfer to/from [facility] with MinA  PT Short Term Goal 3 (Week 1): Patient to be able to ambulate at least 19ft with LRAD, MinA   Skilled Therapeutic Interventions/Progress Updates:   Pt in supine and agreeable to therapy, denies pain but reports ongoing dizziness and wanting to try to participate in therapy. Session focused on OOB tolerance. BP in supine 137/53. Transferred to EOB w/ max assist and maintained static sitting balance w/ min-mod assist for 2-3 minutes, BP 143/65 in sitting. Pt denied any increase in dizziness w/ transitioning to EOB. Transferred to recliner via stand pivot w/ mod assist w/ verbal and tactile cues for upright posture and technique. Reclined in recliner for comfort and to minimize increase in dizziness, BP 127/44. Taken out of room in recliner to orient pt to rehab unit, pt appreciative of therapist taking her out of her room. Returned to room, BP 143/55, pt declined resting up in recliner 2/2 fatigue/dizziness. Transferred back to EOB w/ max assist via squat pivot, pt unable to reach full standing 2/2 LE fatigue. Total assist to reposition in bed for pressure relief and comfort. Ended session in supine, all needs in reach. Missed 15 min of skilled PT 2/2 fatigue/dizziness.   Therapy Documentation Precautions:  Precautions Precautions: Fall, Other (comment) Precaution Comments: watch BP, dizziness  Restrictions Weight Bearing Restrictions: No General: PT Amount of Missed Time (min): 15  Minutes PT Missed Treatment Reason: Patient fatigue(dizziness) Vital Signs: Therapy Vitals BP: (!) 127/44 Patient Position (if appropriate): Sitting(reclined in recliner) Pain: Pain Assessment Pain Scale: 0-10 Pain Score: 2  Pain Type: Acute pain Pain Location: Head Pain Orientation: Right;Left;Anterior Pain Descriptors / Indicators: Headache Pain Frequency: Intermittent Pain Onset: On-going Patients Stated Pain Goal: 2 Pain Intervention(s): Medication (See eMAR)  Therapy/Group: Individual Therapy  Racquelle Hyser Melton Krebs 12/22/2017, 1:55 PM

## 2017-12-23 ENCOUNTER — Inpatient Hospital Stay (HOSPITAL_COMMUNITY): Payer: Medicare Other | Admitting: Physical Therapy

## 2017-12-23 ENCOUNTER — Inpatient Hospital Stay (HOSPITAL_COMMUNITY): Payer: Medicare Other | Admitting: Speech Pathology

## 2017-12-23 ENCOUNTER — Inpatient Hospital Stay (HOSPITAL_COMMUNITY): Payer: Medicare Other | Admitting: Occupational Therapy

## 2017-12-23 ENCOUNTER — Inpatient Hospital Stay (HOSPITAL_COMMUNITY): Payer: Medicare Other

## 2017-12-23 ENCOUNTER — Ambulatory Visit: Payer: Medicare Other | Admitting: Sports Medicine

## 2017-12-23 DIAGNOSIS — I69391 Dysphagia following cerebral infarction: Secondary | ICD-10-CM

## 2017-12-23 MED ORDER — TOPIRAMATE 25 MG PO TABS
25.0000 mg | ORAL_TABLET | Freq: Two times a day (BID) | ORAL | Status: DC
  Administered 2017-12-23 – 2017-12-25 (×4): 25 mg via ORAL
  Filled 2017-12-23 (×4): qty 1

## 2017-12-23 NOTE — Progress Notes (Signed)
[place] PHYSICAL MEDICINE & REHABILITATION PROGRESS NOTE   Subjective/Complaints: Appreciate neuro note, discussed BP meds with Pharm  Also c/o nausea and dizziness ROS- no CP, SOB, N/V/D Objective:   [name] Contrast  Result Date: 12/21/2017 CLINICAL DATA:  Increasing dizziness. Recent brainstem infarct with upper and lower extremity weakness. EXAM: MRI HEAD WITHOUT CONTRAST TECHNIQUE: Multiplanar, multiecho pulse sequences of the Brandy and surrounding structures were obtained without intravenous contrast. COMPARISON:  MRI Brandy 12/17/2017. FINDINGS: The patient was unable to remain motionless for the exam. Small or subtle lesions could be overlooked. Brandy: On diffusion-weighted imaging, there is increased extent, and increased intensity of restriction, with corresponding low ADC, of the previously identified mid pontine infarcts. There is worsening BILATERAL paramedian acute infarction, greater on the RIGHT, which increasingly involves the ventral aspect of the pons. See series 4 images 10-14. Mild atrophy. Minor white matter disease. No significant hemorrhage. Vascular: Patency of the basilar artery is established based on flow void signal. Skull and upper cervical spine: Normal marrow signal. Partial empty sella. Sinuses/Orbits: No significant sinus opacity or layering fluid. Negative orbits. Other: None IMPRESSION: Worsening brainstem infarction based on new surrounding pontine areas of restricted diffusion, and increasing intensity of restricted diffusion within existing areas of recent infarction, involving the BILATERAL pons, see discussion above. No hemorrhage. Basilar artery remains patent based on flow void signal. Electronically Signed   By: Elsie Stain M.D.   On: 12/21/2017 16:36   Recent Labs    12/20/17 1538 12/21/17 0423  WBC 9.0 7.4  HGB 13.1 13.4  HCT 40.6 40.3  PLT 326 293   Recent Labs    12/21/17 0423 12/22/17 0520  NA 144 139  K 2.9* 3.9  CL 109 108   CO2 27 23  GLUCOSE 186* 149*  BUN 19 12  CREATININE 0.66 0.61  CALCIUM 9.5 9.1    Intake/Output Summary (Last 24 hours) at 12/23/2017 0923 Last data filed at 12/23/2017 0920 Gross per 24 hour  Intake 1146.6 ml  Output 2400 ml  Net -1253.4 ml     Physical Exam: Vital Signs Blood pressure 93/73, pulse (!) 56, temperature 98.2 F (36.8 C), resp. rate 17, height 5\' 2"  (1.575 m), weight 58.2 kg, SpO2 94 %.   General: No acute distress Mood and affect are appropriate Heart: Regular rate and rhythm no rubs murmurs or extra sounds Lungs: Clear to auscultation, breathing unlabored, no rales or wheezes Abdomen: Positive bowel sounds, soft nontender to palpation, nondistended Extremities: No clubbing, cyanosis, or edema Skin: No evidence of breakdown, no evidence of rash Neurologic: Cranial nerves II through XII intact, motor strength is 4/5 in bilateral deltoid, bicep, tricep, grip, hip flexor, knee extensors, ankle dorsiflexor and plantar flexor Sensory exam normal sensation to light touch  in bilateral upper and lower extremities Cerebellar exam mild right  and moderate left l finger to nose to finger , min Right and mild left heel to shin dysmetria Musculoskeletal: Full range of motion in all 4 extremities. No joint swelling   Assessment/Plan: 1. 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  Physiatrist is providing close team supervision and 24 hour management of active medical problems listed below.  Physiatrist and rehab team continue to assess barriers to discharge/monitor patient progress toward functional and medical goals  Care Tool:  Bathing              Bathing assist  Assist Level: Total Assistance - Patient < 25%     Upper Body Dressing/Undressing Upper body dressing   What is the patient wearing?: Pull over shirt    Upper body assist Assist  Level: Maximal Assistance - Patient 25 - 49%    Lower Body Dressing/Undressing Lower body dressing      What is the patient wearing?: Underwear/pull up, Pants     Lower body assist Assist for lower body dressing: Total Assistance - Patient < 25%     Toileting Toileting    Toileting assist Assist for toileting: Moderate Assistance - Patient 50 - 74%     Transfers Chair/bed transfer  Transfers assist  Chair/bed transfer activity did not occur: Safety/medical concerns  Chair/bed transfer assist level: Maximal Assistance - Patient 25 - 49%     Locomotion Ambulation   Ambulation assist   Ambulation activity did not occur: Safety/medical concerns(dizziness/BP )          Walk 10 feet activity   Assist  Walk 10 feet activity did not occur: Safety/medical concerns(dizziness/BP )        Walk 50 feet activity   Assist Walk 50 feet with 2 turns activity did not occur: Safety/medical concerns(dizziness/BP )         Walk 150 feet activity   Assist Walk 150 feet activity did not occur: Safety/medical concerns(dizziness/BP )         Walk 10 feet on uneven surface  activity   Assist Walk 10 feet on uneven surfaces activity did not occur: Safety/medical concerns(dizziness/BP)         Wheelchair     Assist Will patient use wheelchair at discharge?: (TBD ) Type of Wheelchair: (TBD) Wheelchair activity did not occur: Safety/medical concerns(dizziness/BP)         Wheelchair 50 feet with 2 turns activity    Assist    Wheelchair 50 feet with 2 turns activity did not occur: Safety/medical concerns(dizziness/BP)       Wheelchair 150 feet activity     Assist Wheelchair 150 feet activity did not occur: Safety/medical concerns(dizziness/BP)          Medical Problem List and Plan: 1.Decreased functional mobility with dysarthria/dysphagiasecondary to 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 DVT Prophylaxis/Anticoagulation: subcutaneous Lovenox. Monitor for any bleeding episodes 3. Pain Management:Tylenol as needed 4. Mood:Ativan 1 mg 3 times a day as needed 5. Neuropsych: This patientiscapable of making decisions on herown behalf. 6. Skin/Wound Care:routine skin checks 7. Fluids/Electrolytes/Nutrition:Encourage PO intake.  -Routine in and out's with follow-up chemistries 8.Dysphagia. Dysphagia #1 pudding thick liquids. Monitor hydration. Follow-up speech therapy BUN ok 11/6 9.PermissiveHypertension.Tenormin 25 mg daily. Monitor with increased mobility Vitals:   12/23/17 0656 12/23/17 0706  BP: (!) 168/78 93/73  Pulse:  (!) 56  Resp:    Temp:    SpO2:  94%  will tighten slighly with goal of 130-160 systolic but avoiding hypotension as well Manual reading 168 sys , automatic sys 93  10.Hypothyroidism. Synthroid 11. Hyperlipidemia. Zetia 12.Diet-controlled diabetes mellitus. Hemoglobin A1c 6.5. CBGs discontinued 13.  HypoK- no diuretic, likely nutritional add supplement LOS: 14.  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  Nausea likely due to CVA add Zofran prn,  3 days A FACE TO FACE EVALUATION WAS PERFORMED  Erick Colace 12/23/2017, 9:23 AM

## 2017-12-23 NOTE — Progress Notes (Signed)
Social Work Patient ID: Brandy Flowers, female   DOB: [birth year], 77 y.o.   MRN: [id number] Brother asked of [facility] MD could come in and take care of pt's nails. She is an active pt with them. Dan-PA will call to inquire about this.

## 2017-12-23 NOTE — IPOC Note (Signed)
Overall Plan of Care [facility]) Patient Details Name: Brandy Flowers MRN: [id number] DOB: 1940/12/02  Admitting Diagnosis: <principal problem not specified>  Hospital Problems: Active Problems:   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 (HCC)   Oropharyngeal dysphagia     Functional Problem List: Nursing Endurance, Nutrition, Pain, Safety, Skin Integrity  PT Balance, Endurance, Motor, Perception, Safety  OT Balance, Endurance, Motor, Safety, Cognition  SLP Cognition, Linguistic, Nutrition  TR         Basic ADL's: OT Eating, Grooming, Bathing, Dressing, Toileting     Advanced  ADL's: OT Simple Meal Preparation     Transfers: PT Bed Mobility, Bed to Chair, Car, [employer], Civil Service fast streamer, Research scientist (life sciences): PT Ambulation, Psychologist, prison and probation services, Stairs     Additional Impairments: OT Fuctional Use of Upper Extremity  SLP Swallowing, Communication, Social Cognition expression Problem Solving, Awareness  TR      Anticipated Outcomes Item Anticipated Outcome  Self Feeding Independent  Swallowing  Supervision   Basic self-care  Mod I  Toileting  Mod I   Bathroom Transfers supervision  Bowel/Bladder  Continent to bowel and bladder with min. assist.  Transfers  S with LRAD   Locomotion  S with LRAD   Communication  Mod I  Cognition  Mod I   Pain  Less than 3,on scale 1 to 10.  Safety/Judgment  Free from falls during her stay in rehab.   Therapy Plan: PT Intensity: Minimum of 1-2 x/day ,45 to 90 minutes PT Frequency: 5 out of 7 days PT Duration Estimated Length of Stay: 2-3 weeks  OT Intensity: Minimum of 1-2 x/day, 45 to 90 minutes OT Frequency: 5 out of 7 days OT Duration/Estimated Length of Stay: 15-19 days SLP Intensity: Minumum of 1-2 x/day, 30 to 90 minutes SLP Frequency: 3 to 5 out of 7 days SLP Duration/Estimated Length of Stay: 15-19 days    Team Interventions: Nursing Interventions Patient/Family Education, Disease Management/Prevention,  Discharge Planning, Pain Management, Dysphagia/Aspiration Precaution Training  PT interventions Ambulation/gait training, Discharge planning, Functional mobility training, Psychosocial support, Therapeutic Activities, Visual/perceptual remediation/compensation, Balance/vestibular training, Disease management/prevention, Neuromuscular re-education, Skin care/wound management, Therapeutic Exercise, Wheelchair propulsion/positioning, Cognitive remediation/compensation, DME/adaptive equipment instruction, Pain management, Splinting/orthotics, UE/LE Strength taining/ROM, Community reintegration, Development worker, international aid stimulation, Equities trader education, Museum/gallery curator, UE/LE Coordination activities  OT Interventions Warden/ranger, Discharge planning, Cognitive remediation/compensation, DME/adaptive equipment instruction, Functional mobility training, Psychosocial support, Patient/family education, Neuromuscular re-education, Self Care/advanced ADL retraining, Therapeutic Activities, UE/LE Coordination activities, UE/LE Strength taining/ROM, Therapeutic Exercise  SLP Interventions Cognitive remediation/compensation, Dysphagia/aspiration precaution training, Internal/external aids, Speech/Language facilitation, Cueing hierarchy, Environmental controls, Therapeutic Activities, Patient/family education, Functional tasks  TR Interventions    SW/CM Interventions Discharge Planning, Psychosocial Support, Patient/Family Education   Barriers to Discharge MD  Medical stability  Nursing      PT      OT Decreased caregiver support Pt's brother will live with her, but he works 3 12 hr shifts. Pt states she will have friends assist on those days.  SLP Nutrition means    SW Decreased caregiver support Not confirmed 24 hr care as of now   Team Discharge Planning: Destination: PT-Home ,OT- Home , SLP-Home Projected Follow-up: PT-Home health PT, OT-  Home health OT, SLP-24 hour supervision/assistance,  Outpatient SLP, Home Health SLP Projected Equipment Needs: PT-3 in 1 bedside comode, Rolling walker with 5" wheels, Tub/shower seat, OT- Tub/shower bench, SLP-To be determined Equipment Details: PT- , OT-  Patient/family involved in discharge planning: PT- Patient,  OT-Patient, SLP-Patient  MD ELOS: 18-21d Medical Rehab Prognosis:  Good Assessment:  77 year old right handed female history of hypertension, renal artery stenosis and diabetes mellitus. Per chart review and patient, patient lives alone. Independent prior to admission. One level home with 2 steps to entry. She has a brother who is a Engineer, civil (consulting) and states she will be living with him post discharge. Patient presented 12/17/2017 with slurred speech and altered mental status. Cranial CT scan reviewed, unremarkable for acute intracranial process. 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 Patient did not receive TPA. CT angiogram of head and neck with no significant carotid or vertebral artery stenosis. Negative for emergent large vessel occlusion. Echocardiogram with ejection fraction of 70% grade 1 diastolic dysfunction. No wall motion abnormalities. Maintained on aspirin and Plavix 3 months and Plavix alone  Now requiring 24/7 Rehab RN,MD, as well as CIR level PT, OT and SLP.  Treatment team will focus on ADLs and mobility with goals set at Mod I Sup  See Team Conference Notes for weekly updates to the plan of care

## 2017-12-23 NOTE — Plan of Care (Signed)
  Problem: Consults Goal: RH STROKE PATIENT EDUCATION Description See Patient Education module for education specifics  Outcome: Progressing   Problem: RH BOWEL ELIMINATION Goal: RH STG MANAGE BOWEL WITH ASSISTANCE Description STG Manage Bowel with min. Assistance.  Outcome: Progressing Goal: RH STG MANAGE BOWEL W/MEDICATION W/ASSISTANCE Description STG Manage Bowel with Medication with Assistance. Outcome: Progressing   Problem: RH BLADDER ELIMINATION Goal: RH STG MANAGE BLADDER WITH ASSISTANCE Description STG Manage Bladder With min. Assistance  Outcome: Progressing   Problem: RH SKIN INTEGRITY Goal: RH STG SKIN FREE OF INFECTION/BREAKDOWN Description With min. Assist.  Outcome: Progressing Goal: RH STG MAINTAIN SKIN INTEGRITY WITH ASSISTANCE Description STG Maintain Skin Integrity With min.Assistance.  Outcome: Progressing   Problem: RH SAFETY Goal: RH STG ADHERE TO SAFETY PRECAUTIONS W/ASSISTANCE/DEVICE Description STG Adhere to Safety Precautions With min.Assistance/Device.  Outcome: Progressing   Problem: RH PAIN MANAGEMENT Goal: RH STG PAIN MANAGED AT OR BELOW PT'S PAIN GOAL Description Less than 3,on 1 to 10 scale.  Outcome: Progressing   Problem: RH KNOWLEDGE DEFICIT Goal: RH STG INCREASE KNOWLEDGE OF HYPERTENSION Description Pt. Able to verbalized her knowledge about hypertension,low salt diet and HTN medicine.  Outcome: Progressing Goal: RH STG INCREASE KNOWLEDGE OF DYSPHAGIA/FLUID INTAKE Description Pt. And family will be able to verbalized the information about dysphagia diet and prevention of aspiration.  Outcome: Progressing

## 2017-12-23 NOTE — Progress Notes (Signed)
Speech Language Pathology Daily Session Note  Patient Details  Name: Brandy Flowers MRN: [id number] Date of Birth: [birth year]  Today's Date: 12/23/2017 SLP Individual Time: 0730-0825 SLP Individual Time Calculation (min): 55 min  Short Term Goals: Week 1: SLP Short Term Goal 1 (Week 1): Patient will utilize speech intelligibility strategies at the conversation level with Min A verbal cues to achieve ~90% intelligibility.  SLP Short Term Goal 2 (Week 1): Patient will consume current diet with minimal overt s/s of aspiration with supervision verbal cues.  SLP Short Term Goal 3 (Week 1): Patient will consume trials of ice chips with minimal overt s/s of aspiration in 50% of trials with Min A verbal cues to assess readiness for repeat MBS.  SLP Short Term Goal 4 (Week 1): Patient will perform pharyngeal strengthening exercises with Mod A verbal cues.  SLP Short Term Goal 5 (Week 1): Patient will demonstrate functional problem solving for mildly complex tasks with supervision verbal cues.  SLP Short Term Goal 6 (Week 1): Patient will identify 2 impairments that put her at a high aspiration risk with Min A verbal cues.   Skilled Therapeutic Interventions: Skilled treatment session focused on dysphagia and speech goals. SLP facilitated session by providing set-up and Min A verbal cues for thoroughness with oral care via the suction toothbrush. Patient consumed trials of ice chips and demonstrated what appeared to be a delayed swallow initiation with an intermittent wet vocal quality that cleared with Max verbal cues. Recommend continued trials with SLP only. SLP also facilitated session by providing Mod-Max A verbal cues for use of small bites of liquids/solids and to self-monitor and correct her intermittent wet vocal quality with breakfast meal of Dys. 1 textures with pudding-thick liquids. SLP also clarified diet orders with RN. Patient left upright in bed with alarm on and all needs within reach.  Continue with current plan of care.   Pain Pain Assessment Pain Scale: 0-10 Pain Score: 0-No pain  Therapy/Group: Individual Therapy  Brandy Flowers 12/23/2017, 9:49 AM

## 2017-12-23 NOTE — Progress Notes (Signed)
I responded to a [place] to provide spiritual support for the patient. I visited the patient's room and provided spiritual support by listening to the patient with compassion as she talked about her DNR decision and her faith. I shared words of encouragement and led in prayer. I shared that the Chaplain is available for additional support as needed or requested.    12/23/17 1000  Clinical Encounter Type  Visited With Patient  Visit Type Spiritual support  Referral From Nurse  Consult/Referral To Chaplain    Chaplain Dr Melvyn Novas

## 2017-12-23 NOTE — Progress Notes (Signed)
Occupational Therapy Session Note  Patient Details  Name: Brandy Flowers MRN: [id number] Date of Birth: [birth year]  Today's Date: 12/23/2017 OT Individual Time: 4098-1191 OT Individual Time Calculation (min): 78 min    Short Term Goals: Week 1:  OT Short Term Goal 1 (Week 1): Pt will be able to tolerate standing with LRAD for 2 min at a time to engage in LB self care.  OT Short Term Goal 2 (Week 1): Pt will wash UB with set up. OT Short Term Goal 3 (Week 1): Pt will wash LB with min A. OT Short Term Goal 4 (Week 1): Pt will transfer to toilet with min A.  OT Short Term Goal 5 (Week 1): Pt will tolerate being upright for at least 30 min to enable her to be able to shower.   Skilled Therapeutic Interventions/Progress Updates:    Patient in bed upon arrival.  States that she is feeling a little better and dizziness seems to be improving from yesterday. Bed mobility:  CS/min A SPT bed to/from w/c:  Min/mod A, verbal and tactile cues for upright posture and technique Unsupported sitting at edge of bed:  CS, able to reach out of base of support and maintain balance ADL:  LB dressing and bathing completed in bed with mod/max A overall, UB dressing and bathing completed seated in w/c with min/mod A (limited by IV), grooming CS for oral care (cues for swallow precaution), mod/max A for hair care (use of hair dryer and washing) Affect/behavior:  Affect varied throughout session, flat at times, labile also but occ humor that was appropriate for setting.  She is aware of her needs, no impulsive behavior observed Patient returned to bed at end of session with alarms set.  Therapy Documentation Precautions:  Precautions Precautions: Fall, Other (comment) Precaution Comments: watch BP, dizziness  Restrictions Weight Bearing Restrictions: No General:   Vital Signs:  Pain: Pain Assessment Pain Scale: 0-10 Pain Score: 0-No pain   Therapy/Group: Individual Therapy  Barrie Lyme 12/23/2017, 12:25 PM

## 2017-12-23 NOTE — Progress Notes (Signed)
Physical Therapy Session Note  Patient Details  Name: Brandy Flowers MRN: [id number] Date of Birth: [birth year]  Today's Date: 12/23/2017 PT Individual Time: 5284-1324 PT Individual Time Calculation (min): 53 min   Short Term Goals: Week 1:  PT Short Term Goal 1 (Week 1): Patient to tolerate sitting at EOB for at least 10 minutes to allow for improved participation in activities  PT Short Term Goal 2 (Week 1): Patient to transfer to/from [facility] with MinA  PT Short Term Goal 3 (Week 1): Patient to be able to ambulate at least 33ft with LRAD, MinA   Skilled Therapeutic Interventions/Progress Updates:    pt rec'd supine in bed with c/o dizziness, no c/o pain. Pt perseverative on having Ativan at 2pm.  Pt agreeable to out of bed activity.  Supine BP 161/58, seated BP 150/60 upon sitting and after 5 minutes of sitting BP remains 150/60.  Vitals checked with manual BP due to inaccuracy of automatic BP machine.  Pt able to perform stand pivot transfer with mod A to recliner.  Pt then performs sit <> stand and stepping fwd/bkwd and sideways x 6 attempts with mod A to stand with RW, max A for gait due to posterior lean and LE weakness.  Pt then performs seated therex for bilat LE strengthening. Pt left in recliner with alarm set, needs at hand.  Therapy Documentation Precautions:  Precautions Precautions: Fall, Other (comment) Precaution Comments: watch BP, dizziness  Restrictions Weight Bearing Restrictions: No Vital Signs: Therapy Vitals BP: (!) 150/60 Patient Position (if appropriate): Sitting Pain: Pain Assessment Pain Scale: 0-10 Pain Score: 0-No pain   Therapy/Group: Individual Therapy  DONAWERTH,KAREN 12/23/2017, 2:22 PM

## 2017-12-24 ENCOUNTER — Inpatient Hospital Stay (HOSPITAL_COMMUNITY): Payer: Medicare Other | Admitting: Occupational Therapy

## 2017-12-24 ENCOUNTER — Inpatient Hospital Stay (HOSPITAL_COMMUNITY): Payer: Medicare Other | Admitting: Speech Pathology

## 2017-12-24 ENCOUNTER — Inpatient Hospital Stay (HOSPITAL_COMMUNITY): Payer: Medicare Other | Admitting: Physical Therapy

## 2017-12-24 NOTE — Progress Notes (Signed)
Speech Language Pathology Daily Session Note  Patient Details  Name: Siarah Deleo MRN: [id number] Date of Birth: 23-Jul-1940  Today's Date: 12/24/2017 SLP Individual Time: 1345-1430 SLP Individual Time Calculation (min): 45 min  Short Term Goals: Week 1: SLP Short Term Goal 1 (Week 1): Patient will utilize speech intelligibility strategies at the conversation level with Min A verbal cues to achieve ~90% intelligibility.  SLP Short Term Goal 2 (Week 1): Patient will consume current diet with minimal overt s/s of aspiration with supervision verbal cues.  SLP Short Term Goal 3 (Week 1): Patient will consume trials of ice chips with minimal overt s/s of aspiration in 50% of trials with Min A verbal cues to assess readiness for repeat MBS.  SLP Short Term Goal 4 (Week 1): Patient will perform pharyngeal strengthening exercises with Mod A verbal cues.  SLP Short Term Goal 5 (Week 1): Patient will demonstrate functional problem solving for mildly complex tasks with supervision verbal cues.  SLP Short Term Goal 6 (Week 1): Patient will identify 2 impairments that put her at a high aspiration risk with Min A verbal cues.   Skilled Therapeutic Interventions:  Skilled treatment session focused on dysphagia and speech intelligibility goals. SLP provided supervision cues to achieve appropriate speech intelligibility during simple conversation via phone with her granddaughter. Pt able to communicate that therapy was present etc. SLP further facilitated session by providing skilled observation of pt consuming ice chips. Pt held cup and self-fed. Pt with no overt s/s of aspiration. Pt tearful towards end of session, support provided. Pt left upright in bed, bed alarm on and all needs within reach. Continue per current plan of care.      Pain Pain Assessment Pain Scale: 0-10 Pain Score: 0-No pain(Reports that she is dizzy) Pain Type: Acute pain Pain Location: Head Pain Descriptors / Indicators:  Aching Pain Frequency: Intermittent Pain Onset: Gradual Patients Stated Pain Goal: 0 Pain Intervention(s): Medication (See eMAR)  Therapy/Group: Individual Therapy  Ladell Lea 12/24/2017, 2:37 PM

## 2017-12-24 NOTE — Progress Notes (Signed)
[place] [place] PHYSICAL MEDICINE & REHABILITATION PROGRESS NOTE   Subjective/Complaints: "feel bad"  No specific c/o except dizziness , poor sleep due to HA No hiccups, + anxiety   ROS- no CP, SOB, N/V/D Objective:   No results found. No results for input(s): WBC, HGB, HCT, PLT in the last 72 hours. Recent Labs    12/22/17 0520  NA 139  K 3.9  CL 108  CO2 23  GLUCOSE 149*  BUN 12  CREATININE 0.61  CALCIUM 9.1    Intake/Output Summary (Last 24 hours) at 12/24/2017 0931 Last data filed at 12/24/2017 0800 Gross per 24 hour  Intake 2202.39 ml  Output 1100 ml  Net 1102.39 ml     Physical Exam: Vital Signs Blood pressure (!) 169/59, pulse 85, temperature 98 F (36.7 C), temperature source Oral, resp. rate 18, height 5\' 2"  (1.575 m), weight 58.2 kg, SpO2 98 %.   General: No acute distress Mood and affect are appropriate Heart: Regular rate and rhythm no rubs murmurs or extra sounds Lungs: Clear to auscultation, breathing unlabored, no rales or wheezes Abdomen: Positive bowel sounds, soft nontender to palpation, nondistended Extremities: No clubbing, cyanosis, or edema Skin: No evidence of breakdown, no evidence of rash Neurologic: Cranial nerves II through XII intact, motor strength is 4/5 in bilateral deltoid, bicep, tricep, grip, hip flexor, knee extensors, ankle dorsiflexor and plantar flexor Sensory exam normal sensation to light touch  in bilateral upper and lower extremities Cerebellar exam mild right  and moderate left l finger to nose to finger , min Right and mild left heel to shin dysmetria Musculoskeletal: Full range of motion in all 4 extremities. No joint swelling Neuro exam unchanged 11/9  Assessment/Plan: 1. 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  Physiatrist is providing close team supervision and 24 hour management of active medical  problems listed below.  Physiatrist and rehab team continue to assess barriers to discharge/monitor patient progress toward functional and medical goals  Care Tool:  Bathing    Body parts bathed by patient: Right arm, Left arm, Right upper leg, Left upper leg, Face   Body parts bathed by helper: Front perineal area, Buttocks, Left lower leg, Right lower leg Body parts n/a: Chest, Abdomen   Bathing assist Assist Level: Moderate Assistance - Patient 50 - 74%     Upper Body Dressing/Undressing Upper body dressing   What is the patient wearing?: Pull over shirt    Upper body assist Assist Level: Moderate Assistance - Patient 50 - 74%    Lower Body Dressing/Undressing Lower body dressing      What is the patient wearing?: Pants, Incontinence brief     Lower body assist Assist for lower body dressing: Moderate Assistance - Patient 50 - 74%     Toileting Toileting    Toileting assist Assist for toileting: Moderate Assistance - Patient 50 - 74%(I&O cath, incontinence at times)     Transfers Chair/bed transfer  Transfers assist  Chair/bed transfer activity did not occur: Safety/medical concerns  Chair/bed transfer assist level: Moderate Assistance - Patient 50 - 74%     Locomotion Ambulation   Ambulation assist   Ambulation activity did not occur: Safety/medical concerns(dizziness/BP )  Assist level: Maximal Assistance - Patient 25 - 49%   Max distance: 3   Walk 10 feet activity   Assist  Walk 10 feet activity did not occur: Safety/medical concerns(dizziness/BP )  Walk 50 feet activity   Assist Walk 50 feet with 2 turns activity did not occur: Safety/medical concerns(dizziness/BP )         Walk 150 feet activity   Assist Walk 150 feet activity did not occur: Safety/medical concerns(dizziness/BP )         Walk 10 feet on uneven surface  activity   Assist Walk 10 feet on uneven surfaces activity did not occur: Safety/medical  concerns(dizziness/BP)         Wheelchair     Assist Will patient use wheelchair at discharge?: (TBD ) Type of Wheelchair: (TBD) Wheelchair activity did not occur: Safety/medical concerns(dizziness/BP)         Wheelchair 50 feet with 2 turns activity    Assist    Wheelchair 50 feet with 2 turns activity did not occur: Safety/medical concerns(dizziness/BP)       Wheelchair 150 feet activity     Assist Wheelchair 150 feet activity did not occur: Safety/medical concerns(dizziness/BP)          Medical Problem List and Plan: 1.Decreased functional mobility with 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 DVT Prophylaxis/Anticoagulation: subcutaneous Lovenox. Monitor for any bleeding episodes 3. Pain Management:Tylenol as needed 4. Mood:Ativan 1 mg 3 times a day as needed 5. Neuropsych: This patientiscapable of making decisions on herown behalf. 6. Skin/Wound Care:routine skin checks 7. Fluids/Electrolytes/Nutrition:Encourage PO intake.  -Routine in and out's with follow-up chemistries 8.Dysphagia. Dysphagia #1 pudding thick liquids. Monitor hydration. Follow-up speech therapy BUN ok 11/6 9.PermissiveHypertension.Tenormin 25 mg daily. Monitor with increased mobility Vitals:   12/24/17 0652 12/24/17 0807  BP: (!) 162/68 (!) 169/59  Pulse:  85  Resp:  18  Temp:    SpO2:  98%  control improving no change in Cozaar  10.Hypothyroidism. Synthroid 11. Hyperlipidemia. Zetia 12.Diet-controlled diabetes mellitus. Hemoglobin A1c 6.5. CBGs discontinued 13.  HypoK- no diuretic, likely nutritional add supplement recheck in am LOS: 14.  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  Nausea likely due to CVA add Zofran prn,  16.  Hx migraine, worsened since CVA used tenormin at home added topamax- improved this am 4 days A FACE TO FACE EVALUATION WAS PERFORMED  Erick Colace 12/24/2017, 9:31 AM

## 2017-12-24 NOTE — Progress Notes (Signed)
Physical Therapy Session Note  Patient Details  Name: Brandy Flowers MRN: [id number] Date of Birth: 1940-12-29  Today's Date: 12/24/2017 PT Individual Time: 1000-1035 PT Individual Time Calculation (min): 35 min  and Today's Date: 12/24/2017 PT Missed Time: 25 Minutes Missed Time Reason: Patient fatigue;Patient unwilling to participate;Patient ill (Comment)(nausea)  Short Term Goals: Week 1:  PT Short Term Goal 1 (Week 1): Patient to tolerate sitting at EOB for at least 10 minutes to allow for improved participation in activities  PT Short Term Goal 2 (Week 1): Patient to transfer to/from [facility] with MinA  PT Short Term Goal 3 (Week 1): Patient to be able to ambulate at least 49ft with LRAD, MinA   Skilled Therapeutic Interventions/Progress Updates:   Pt in supine and reports feeling overall terrible today 2/2 increased dizziness and pt very discouraged by her lack of progress. Educated pt on both stroke and overall deconditioning deficits, especially as she has spent much of the last 2-3 days in bed while on rehab. She denied any pain and was agreeable to participate w/ encouragement. BP in supine 145/65, transitioned to EOB w/ mod-max assist and BP 144/53 w/ increased c/o dizziness. Dizziness back to baseline after a few minutes of static sitting at EOB, min assist for balance. Manual and verbal cues for scooting technique to scoot to edge. Performed sit<>stand x3 w/ mod assist, able to reach full upright w/ tactile and verbal cues for posture and LE placement. Unable to tolerate more than a few seconds of standing each time. Pt declined further activity, stating "I'm starting to feel nausea and I'm just too weak today". Assisted w/ returning to supine, mod assist to reposition in bed. Ended session in supine, all needs in reach. Missed 25 min of skilled PT 2/2 fatigue, dizziness, and nausea.   Therapy Documentation Precautions:  Precautions Precautions: Fall, Other (comment) Precaution Comments:  watch BP, dizziness  Restrictions Weight Bearing Restrictions: No General: PT Amount of Missed Time (min): 25 Minutes PT Missed Treatment Reason: Patient fatigue;Patient unwilling to participate;Patient ill (Comment)(nausea) Vital Signs: Therapy Vitals Pulse Rate: 85 Resp: 18 BP: (!) 169/59 Patient Position (if appropriate): Sitting Oxygen Therapy SpO2: 98 % Pain: Pain Assessment Pain Scale: 0-10 Pain Score: 0-No pain Pain Type: Acute pain Pain Location: Head Pain Descriptors / Indicators: Aching Pain Frequency: Intermittent Pain Onset: Gradual Patients Stated Pain Goal: 0 Pain Intervention(s): Medication (See eMAR)  Therapy/Group: Individual Therapy  Donyel Castagnola K Gena Laski 12/24/2017, 11:21 AM

## 2017-12-24 NOTE — Progress Notes (Signed)
Occupational Therapy Session Note  Patient Details  Name: Brandy Flowers MRN: [id number] Date of Birth: 1940/09/01  Today's Date: 12/24/2017 OT Individual Time: 4098-1191 OT Individual Time Calculation (min): 25 min    Skilled Therapeutic Interventions/Progress Updates: patient initially refused treatment stating that she was dizzier than usual and did not feel well enough to participate.   After speaking with RN re patient medical status, this clinician reapproached patient to offer to help don pants.   Patient stated she was more comfortable without pants and declined.  However, she did concur to brush her teeth.    She stated she could not use her right hand; however, with verbal cues for instructions to doff the toothpaste lid, she was able to complete the entire oral care task.  Patient left as requested sitting supine in bed with head of bed elevated and call bell and phone within reach.     Therapy Documentation Precautions:  Precautions Precautions: Fall, Other (comment) Precaution Comments: watch BP, dizziness  Restrictions Weight Bearing Restrictions: No General: General OT Amount of Missed Time: 50 Minutes(50)  Pain:denied    Therapy/Group: Individual Therapy  Bud Face [facility] 12/24/2017, 4:26 PM

## 2017-12-24 NOTE — Progress Notes (Signed)
Occupational Therapy Session Note  Patient Details  Name: Brandy Flowers MRN: [id number] Date of Birth: [birth year]  Today's Date: 12/24/2017 OT Individual Time: 1425-1500 OT Individual Time Calculation (min): 35 min    Skilled Therapeutic Interventions/Progress Updates:  Patient stated she was too dizzy to participate this session, but she concurred to the following: Endurance activities in bed with head of bed elevated;   Bed to recliner transfer with moderate assistance  She was left seated in recliner with safety belt fastened with legs elevated and upper portion of w/c reclined back about 0-115 degrees. And with call bell and phonewithin reach.  Therapy Documentation Precautions:  Precautions Precautions: Fall, Other (comment) Precaution Comments: watch BP, dizziness  Restrictions Weight Bearing Restrictions: No  Pain: Pain Assessment Pain Scale: 0-10 Pain Score: 0-No pain(Reports that she is dizzy)  Therapy/Group: Individual Therapy  Bud Face [facility] 12/24/2017, 5:40 PM

## 2017-12-25 MED ORDER — SCOPOLAMINE 1 MG/3DAYS TD PT72
1.0000 | MEDICATED_PATCH | TRANSDERMAL | Status: DC
  Administered 2017-12-25 – 2018-01-09 (×6): 1.5 mg via TRANSDERMAL
  Filled 2017-12-25 (×6): qty 1

## 2017-12-25 MED ORDER — TOPIRAMATE 25 MG PO TABS
50.0000 mg | ORAL_TABLET | Freq: Two times a day (BID) | ORAL | Status: DC
  Administered 2017-12-25 – 2018-01-10 (×32): 50 mg via ORAL
  Filled 2017-12-25 (×32): qty 2

## 2017-12-25 MED ORDER — TRAMADOL HCL 50 MG PO TABS
50.0000 mg | ORAL_TABLET | Freq: Four times a day (QID) | ORAL | Status: DC | PRN
  Administered 2017-12-25 – 2018-01-08 (×23): 50 mg via ORAL
  Filled 2017-12-25 (×23): qty 1

## 2017-12-25 NOTE — Progress Notes (Signed)
[facility] PHYSICAL MEDICINE & REHABILITATION PROGRESS NOTE   Subjective/Complaints:  Dizziness a little better today Notes increased near sitedness HA still an issue but no worsening  ROS- no CP, SOB, N/V/D Objective:   No results found. No results for input(s): WBC, HGB, HCT, PLT in the last 72 hours. No results for input(s): NA, K, CL, CO2, GLUCOSE, BUN, CREATININE, CALCIUM in the last 72 hours.  Intake/Output Summary (Last 24 hours) at 12/25/2017 0928 Last data filed at 12/25/2017 0830 Gross per 24 hour  Intake 55 ml  Output 1134 ml  Net -1079 ml     Physical Exam: Vital Signs Blood pressure (!) 172/67, pulse 62, temperature 97.7 F (36.5 C), temperature source Oral, resp. rate 18, height 5\' 2"  (1.575 m), weight 58.2 kg, SpO2 97 %.   General: No acute distress Mood and affect are appropriate Heart: Regular rate and rhythm no rubs murmurs or extra sounds Lungs: Clear to auscultation, breathing unlabored, no rales or wheezes Abdomen: Positive bowel sounds, soft nontender to palpation, nondistended Extremities: No clubbing, cyanosis, or edema Skin: No evidence of breakdown, no evidence of rash Neurologic: Cranial nerves II through XII intact, motor strength is 4/5 in bilateral deltoid, bicep, tricep, grip, hip flexor, knee extensors, ankle dorsiflexor and plantar flexor Sensory exam normal sensation to light touch  in bilateral upper and lower extremities Cerebellar exam mild right  and moderate left l finger to nose to finger , min Right and mild left heel to shin dysmetria Musculoskeletal: Full range of motion in all 4 extremities. No joint swelling Neuro exam unchanged 11/10  Assessment/Plan: 1. 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  Physiatrist is providing close team supervision and 24 hour management of active medical problems listed  below.  Physiatrist and rehab team continue to assess barriers to discharge/monitor patient progress toward functional and medical goals  Care Tool:  Bathing  Bathing activity did not occur: Refused Body parts bathed by patient: Right arm, Left arm, Right upper leg, Left upper leg, Face   Body parts bathed by helper: Front perineal area, Buttocks, Left lower leg, Right lower leg Body parts n/a: Chest, Abdomen   Bathing assist Assist Level: Moderate Assistance - Patient 50 - 74%     Upper Body Dressing/Undressing Upper body dressing Upper body dressing/undressing activity did not occur (including orthotics): Refused What is the patient wearing?: Pull over shirt    Upper body assist Assist Level: Moderate Assistance - Patient 50 - 74%    Lower Body Dressing/Undressing Lower body dressing    Lower body dressing activity did not occur: Refused What is the patient wearing?: Pants, Incontinence brief     Lower body assist Assist for lower body dressing: Moderate Assistance - Patient 50 - 74%     Toileting Toileting    Toileting assist Assist for toileting: Moderate Assistance - Patient 50 - 74%     Transfers Chair/bed transfer  Transfers assist  Chair/bed transfer activity did not occur: Safety/medical concerns  Chair/bed transfer assist level: Moderate Assistance - Patient 50 - 74%     Locomotion Ambulation   Ambulation assist   Ambulation activity did not occur: Safety/medical concerns(dizziness/BP )  Assist level: Maximal Assistance - Patient 25 - 49%   Max distance: 3   Walk 10 feet activity   Assist  Walk 10 feet activity did not occur: Safety/medical concerns(dizziness/BP )  Walk 50 feet activity   Assist Walk 50 feet with 2 turns activity did not occur: Safety/medical concerns(dizziness/BP )         Walk 150 feet activity   Assist Walk 150 feet activity did not occur: Safety/medical concerns(dizziness/BP )         Walk 10  feet on uneven surface  activity   Assist Walk 10 feet on uneven surfaces activity did not occur: Safety/medical concerns(dizziness/BP)         Wheelchair     Assist Will patient use wheelchair at discharge?: (TBD ) Type of Wheelchair: (TBD) Wheelchair activity did not occur: Safety/medical concerns(dizziness/BP)         Wheelchair 50 feet with 2 turns activity    Assist    Wheelchair 50 feet with 2 turns activity did not occur: Safety/medical concerns(dizziness/BP)       Wheelchair 150 feet activity     Assist Wheelchair 150 feet activity did not occur: Safety/medical concerns(dizziness/BP)          Medical Problem List and Plan: 1.Decreased functional mobility with dysarthria/dysphagiasecondary to 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 DVT Prophylaxis/Anticoagulation: subcutaneous Lovenox. Monitor for any bleeding episodes 3. Pain Management:Tylenol as needed 4. Mood:Ativan 1 mg 3 times a day as needed 5. Neuropsych: This patientiscapable of making decisions on herown behalf. 6. Skin/Wound Care:routine skin checks 7. Fluids/Electrolytes/Nutrition:Encourage PO intake.  -Routine in and out's with follow-up chemistries 8.Dysphagia. Dysphagia #1 pudding thick liquids. Monitor hydration. Follow-up speech therapy- hopes to be advanced this week BUN ok 11/6  9.PermissiveHypertension.Tenormin 25 mg daily. Monitor with increased mobility Vitals:   12/24/17 2005 12/25/17 0405  BP: (!) 143/76 (!) 172/67  Pulse: 62 62  Resp: 16 18  Temp: 97.6 F (36.4 C) 97.7 F (36.5 C)  SpO2: 98% 97%  BPs labile will cont current dose of cozaar , check ortho vitals 10.Hypothyroidism. Synthroid 11. Hyperlipidemia. Zetia 12.Diet-controlled diabetes mellitus. Hemoglobin A1c 6.5. CBGs  discontinued 13.  HypoK- no diuretic, likely nutritional add supplement recheck in am LOS: 14.  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  Nausea likely due to CVA add Zofran prn,  16.  Hx migraine, worsened since CVA used tenormin at home added topamax- if persistant will increase to 50mg  in am 5 days A FACE TO FACE EVALUATION WAS PERFORMED  Erick Colace 12/25/2017, 9:28 AM

## 2017-12-26 ENCOUNTER — Encounter (HOSPITAL_COMMUNITY): Payer: Medicare Other | Admitting: Psychology

## 2017-12-26 ENCOUNTER — Inpatient Hospital Stay (HOSPITAL_COMMUNITY): Payer: Medicare Other | Admitting: Occupational Therapy

## 2017-12-26 ENCOUNTER — Inpatient Hospital Stay (HOSPITAL_COMMUNITY): Payer: Medicare Other

## 2017-12-26 ENCOUNTER — Inpatient Hospital Stay (HOSPITAL_COMMUNITY): Payer: Medicare Other | Admitting: Speech Pathology

## 2017-12-26 DIAGNOSIS — F411 Generalized anxiety disorder: Secondary | ICD-10-CM

## 2017-12-26 NOTE — Progress Notes (Signed)
[facility] PHYSICAL MEDICINE & REHABILITATION PROGRESS NOTE   Subjective/Complaints:  HA was worse yesterday pm, called in tramadol and increased topiramate  ROS- no CP, SOB, N/V/D Objective:   No results found. No results for input(s): WBC, HGB, HCT, PLT in the last 72 hours. No results for input(s): NA, K, CL, CO2, GLUCOSE, BUN, CREATININE, CALCIUM in the last 72 hours.  Intake/Output Summary (Last 24 hours) at 12/26/2017 0905 Last data filed at 12/26/2017 0535 Gross per 24 hour  Intake 850 ml  Output 550 ml  Net 300 ml     Physical Exam: Vital Signs Blood pressure (!) 137/54, pulse 66, temperature 98.5 F (36.9 C), temperature source Oral, resp. rate 16, height 5\' 2"  (1.575 m), weight 58.2 kg, SpO2 95 %.   General: No acute distress Mood and affect are appropriate Heart: Regular rate and rhythm no rubs murmurs or extra sounds Lungs: Clear to auscultation, breathing unlabored, no rales or wheezes Abdomen: Positive bowel sounds, soft nontender to palpation, nondistended Extremities: No clubbing, cyanosis, or edema Skin: No evidence of breakdown, no evidence of rash Neurologic: Cranial nerves II through XII intact, motor strength is 4/5 in bilateral deltoid, bicep, tricep, grip, hip flexor, knee extensors, ankle dorsiflexor and plantar flexor Sensory exam normal sensation to light touch  in bilateral upper and lower extremities Cerebellar exam mild right  and moderate left l finger to nose to finger , min Right and mild left heel to shin dysmetria Musculoskeletal: Full range of motion in all 4 extremities. No joint swelling Neuro exam unchanged 11/10  Assessment/Plan: 1. 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  Physiatrist is providing close team supervision and 24 hour management of active medical problems listed below.  Physiatrist and rehab  team continue to assess barriers to discharge/monitor patient progress toward functional and medical goals  Care Tool:  Bathing  Bathing activity did not occur: Refused Body parts bathed by patient: Right arm, Left arm, Right upper leg, Left upper leg, Face   Body parts bathed by helper: Front perineal area, Buttocks, Left lower leg, Right lower leg Body parts n/a: Chest, Abdomen   Bathing assist Assist Level: Moderate Assistance - Patient 50 - 74%     Upper Body Dressing/Undressing Upper body dressing Upper body dressing/undressing activity did not occur (including orthotics): Refused What is the patient wearing?: Pull over shirt    Upper body assist Assist Level: Moderate Assistance - Patient 50 - 74%    Lower Body Dressing/Undressing Lower body dressing    Lower body dressing activity did not occur: Refused What is the patient wearing?: Pants, Incontinence brief     Lower body assist Assist for lower body dressing: Moderate Assistance - Patient 50 - 74%     Toileting Toileting    Toileting assist Assist for toileting: Moderate Assistance - Patient 50 - 74%     Transfers Chair/bed transfer  Transfers assist  Chair/bed transfer activity did not occur: Safety/medical concerns  Chair/bed transfer assist level: Moderate Assistance - Patient 50 - 74%     Locomotion Ambulation   Ambulation assist   Ambulation activity did not occur: Safety/medical concerns(dizziness/BP )  Assist level: Maximal Assistance - Patient 25 - 49%   Max distance: 3   Walk 10 feet activity   Assist  Walk 10 feet activity did not occur: Safety/medical concerns(dizziness/BP )        Walk 50 feet activity  Assist Walk 50 feet with 2 turns activity did not occur: Safety/medical concerns(dizziness/BP )         Walk 150 feet activity   Assist Walk 150 feet activity did not occur: Safety/medical concerns(dizziness/BP )         Walk 10 feet on uneven surface   activity   Assist Walk 10 feet on uneven surfaces activity did not occur: Safety/medical concerns(dizziness/BP)         Wheelchair     Assist Will patient use wheelchair at discharge?: (TBD ) Type of Wheelchair: (TBD) Wheelchair activity did not occur: Safety/medical concerns(dizziness/BP)         Wheelchair 50 feet with 2 turns activity    Assist    Wheelchair 50 feet with 2 turns activity did not occur: Safety/medical concerns(dizziness/BP)       Wheelchair 150 feet activity     Assist Wheelchair 150 feet activity did not occur: Safety/medical concerns(dizziness/BP)          Medical Problem List and Plan: 1.Decreased functional mobility with dysarthria/dysphagiasecondary to 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 DVT Prophylaxis/Anticoagulation: subcutaneous Lovenox. Monitor for any bleeding episodes 3. Pain Management:Tylenol as needed 4. Mood:Ativan 1 mg 3 times a day as needed 5. Neuropsych: This patientiscapable of making decisions on herown behalf. 6. Skin/Wound Care:routine skin checks 7. Fluids/Electrolytes/Nutrition:Encourage PO intake.  -Routine in and out's with follow-up chemistries 8.Dysphagia. Dysphagia #1 pudding thick liquids. Monitor hydration. Follow-up speech therapy- hopes to be advanced this week BUN ok 11/6  9.PermissiveHypertension.Tenormin 25 mg daily. Monitor with increased mobility Vitals:   12/26/17 0421 12/26/17 0841  BP: (!) 139/59 (!) 137/54  Pulse: (!) 58 66  Resp: 16   Temp:    SpO2: 95%   BPs labile will cont current dose of cozaar , check ortho vitals, inrange per neuro 10.Hypothyroidism. Synthroid 11. Hyperlipidemia. Zetia 12.Diet-controlled diabetes mellitus. Hemoglobin A1c 6.5. CBGs  discontinued 13.  HypoK- no diuretic, likely nutritional add supplement recheck in am LOS: 14.  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  Nausea likely due to CVA add Zofran prn,  16.  Hx migraine, worsened since CVA used tenormin at home added topamax- increased to  50mg  bid, prn tramadol added as well 6 days A FACE TO FACE EVALUATION WAS PERFORMED  Erick Colace 12/26/2017, 9:05 AM

## 2017-12-26 NOTE — Progress Notes (Signed)
Occupational Therapy Session Note  Patient Details  Name: Brandy Flowers MRN: [id number] Date of Birth: Jun 30, 1940  Today's Date: 12/26/2017 OT Individual Time: 1400-1430 OT Individual Time Calculation (min): 30 min    Short Term Goals: Week 1:  OT Short Term Goal 1 (Week 1): Pt will be able to tolerate standing with LRAD for 2 min at a time to engage in LB self care.  OT Short Term Goal 2 (Week 1): Pt will wash UB with set up. OT Short Term Goal 3 (Week 1): Pt will wash LB with min A. OT Short Term Goal 4 (Week 1): Pt will transfer to toilet with min A.  OT Short Term Goal 5 (Week 1): Pt will tolerate being upright for at least 30 min to enable her to be able to shower.   Skilled Therapeutic Interventions/Progress Updates:    1;1. Pt received seated in bed reporting feeling "bad." Supine>sitting with CGA and BP- 142/65 HR- 62 O2 98%. Pt reporting feeling same as supine. Pt dons B socks LLE crossing to seated figure 4 with min A to position LE and RLE elevated on trash can. Pt initially very worked up/nearly hyperventilating when donning R sock d/t inability to reach foot to don sock when attempting to cross into figure 4. Instructed pt in deep breathing to calm pt and trialled LE elevation on trash can with success. Pt completes stand pivot transfer EOB>w/c>recliener and MOD A with VC for hand placement. Pt completes brushing hair with tactile cues to brush back of head. Exited session with pt seated in recliner, LE elvated, all needs in reach and belt alarm on  Therapy Documentation Precautions:  Precautions Precautions: Fall, Other (comment) Precaution Comments: watch BP, dizziness  Restrictions Weight Bearing Restrictions: No General:   Vital Signs: Therapy Vitals Temp: (!) 97.5 F (36.4 C) Pulse Rate: (!) 57 Resp: 18 BP: (!) 147/60 Patient Position (if appropriate): Sitting Oxygen Therapy SpO2: 97 % O2 Device: Room Air  Therapy/Group: Individual Therapy  Shon Hale 12/26/2017, 2:36 PM

## 2017-12-26 NOTE — Progress Notes (Signed)
Physical Therapy Session Note  Patient Details  Name: Brandy Flowers MRN: [id number] Date of Birth: 10-Sep-1940  Today's Date: 12/26/2017 PT Individual Time: 0900-0955 PT Individual Time Calculation (min): 55 min   Short Term Goals: Week 1:  PT Short Term Goal 1 (Week 1): Patient to tolerate sitting at EOB for at least 10 minutes to allow for improved participation in activities  PT Short Term Goal 2 (Week 1): Patient to transfer to/from [facility] with MinA  PT Short Term Goal 3 (Week 1): Patient to be able to ambulate at least 33ft with LRAD, MinA   Skilled Therapeutic Interventions/Progress Updates:    Pt supine in bed upon PT arrival, agreeable to therapy tx and denies pain. In supine pt rates dizziness at an 8/10. BP in supine 142/54, HR 64 bpm. Pt transferred from supine>sitting EOB with min assist. In sitting pt rates dizziness at 8/10 in sitting. BP in sitting 139/68 and HR 72 bpm, In sitting after 3 min BP 138/63. Pt performed sit<>stand from EOB with RW and mod assist, in standing pt assessed BP 133/95. Pt negative for orthostatic hypotension.  While seated EOB pt participated in vestibular testing this session. Smooth pursuits WFL. Horizontal saccades with overshooting to the R, vertical saccades with overshooting going up. Head impulse test (+) to the L. Pt demonstrates central vestibular involvement secondary to history of stroke. Pt prescribed x1 VOR exercises this session. Pt transferred to supine with min assist and instructed in X1 exercise, handout provided. Pt performed exercise 2 x 30 sec. Pt left supine in bed at end of session with needs in reach and bed alarm set.   Therapy Documentation Precautions:  Precautions Precautions: Fall, Other (comment) Precaution Comments: watch BP, dizziness  Restrictions Weight Bearing Restrictions: No    Therapy/Group: Individual Therapy  Cresenciano Genre, PT, DPT 12/26/2017, 7:53 AM

## 2017-12-26 NOTE — Consult Note (Signed)
Neuropsychological Consultation   Patient:   Brandy Flowers   DOB:   [birth year]  MR Number:  [id number]  Location:  [facility] [address] CENTER A [id number] [place] [address] Dept: [phone] Loc: [phone]           Date of Service:   12/26/2017  Start Time:   10 AM End Time:   11 AM  Provider/Observer:  Arley Phenix, Psy.D.       Clinical Neuropsychologist       Billing Code/Service: [phone] 4 Units  Chief Complaint:    Brandy Flowers is a 77 year old female right handed female with a history of hypertension, renal artery stenosis and diabetes.  The patient also has a history of severe anxiety and poor coping.  Patient presented on 12/17/2017 with slurred speech and altered mental status.  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  Patient has had ongoing coping and adjustment issues due to severe anxiety.    Reason for Service:  The patient was referred for neuropsychological consultation due to coping and adjustment issues following her Pontine stroke.  Below is the HPI for the current admission.     [id number] Manring is a 77 year old right handed female history of hypertension, renal artery stenosis and diabetes mellitus. Per chart review and patient, patient lives alone. Independent prior to admission. One level home with 2 steps to entry. She has a brother who is a Engineer, civil (consulting) and states she will be living with him post discharge. Patient presented 12/17/2017 with slurred speech and altered mental status. Cranial CT scan reviewed, unremarkable for acute intracranial process. 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 Patient did not receive TPA. CT angiogram of head and neck with no significant carotid or vertebral artery stenosis. Negative for emergent large vessel occlusion. Echocardiogram with ejection fraction of 70% grade 1  diastolic dysfunction. No wall motion abnormalities. Maintained on aspirin and Plavix 3 months and Plavix alone. Subcutaneous Lovenox for DVT prophylaxis.Dysphagia #1 pudding thick liquid diet. Therapy evaluations completed with recommendations of physical medicine rehabilitation consult. Patient was admitted for a comprehensive rehabilitation program.  Current Status:  Tarsha Blando reports long-standing coping issues and severe anxiety.  She has been treated with ativan for her anxiety.  While the patient is focussed on her discharge home, she continues to need rehab work in both OT/PT due to residual motor deficits.    Behavioral Observation: [place] Droll  presents as a 77 y.o.-year-old Right Caucasian Female who appeared her stated age. her dress was Appropriate and she was Well Groomed and her manners were Appropriate to the situation.  her participation was indicative of Appropriate and Redirectable behaviors.  There were any physical disabilities noted.  she displayed an appropriate level of cooperation and motivation.     Interactions:    Active Redirectable  Attention:   abnormal and Patient was focused on internal preoccupations and and worry.  Memory:   within normal limits; recent and remote memory intact  Visuo-spatial:  not examined  Speech (Volume):  low  Speech:   normal; normal  Thought Process:  Coherent and Relevant  Though Content:  WNL and Rumination; obsessions  Orientation:   person, place, time/date and situation  Judgment:   Poor  Planning:   Fair  Affect:    Anxious  Mood:    Anxious  Insight:   Fair  Intelligence:   normal  Medical History:   Past Medical  History:  Diagnosis Date  . Anxiety   . Arthritis   . Diabetes mellitus without complication (HCC)   . Hypertension   . Sinus problem   . Thyroid disease    Psychiatric History:  Patient has a long-standing history of anxiety and coping issues.    Family Med/Psych History:  Family  History  Problem Relation Age of Onset  . Stroke Mother   . Diabetes Mother   . Heart disease Mother        Before age 51  . Hypertension Mother   . Hyperlipidemia Mother   . Heart disease Father   . Heart attack Father   . Heart disease Brother        After age 17- CABG  . Hypertension Brother     Risk of Suicide/Violence: virtually non-existent Patient denies SI or HI.  Impression/DX: Brandy Flowers is a 77 year old female right handed female with a history of hypertension, renal artery stenosis and diabetes.  The patient also has a history of severe anxiety and poor coping.  Patient presented on 12/17/2017 with slurred speech and altered mental status.  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  Patient has had ongoing coping and adjustment issues due to severe anxiety.  Lashanda Storlie reports long-standing coping issues and severe anxiety.  She has been treated with ativan for her anxiety.  While the patient is focussed on her discharge home, she continues to need rehab work in both OT/PT due to residual motor deficits.    Worked on coping and adjustment issues today.    Disposition/Plan:  Will follow-up with patient later in week or first of next week if needed.    Diagnosis:    Generalized Anxiety Disorder, Stroke         Electronically Signed   _______________________ Arley Phenix, Psy.D.

## 2017-12-26 NOTE — Progress Notes (Signed)
Speech Language Pathology Daily Session Note  Patient Details  Name: Brandy Flowers MRN: [id number] Date of Birth: [birth year]  Today's Date: 12/26/2017 SLP Individual Time: 0720-0815 SLP Individual Time Calculation (min): 55 min  Short Term Goals: Week 1: SLP Short Term Goal 1 (Week 1): Patient will utilize speech intelligibility strategies at the conversation level with Min A verbal cues to achieve ~90% intelligibility.  SLP Short Term Goal 2 (Week 1): Patient will consume current diet with minimal overt s/s of aspiration with supervision verbal cues.  SLP Short Term Goal 3 (Week 1): Patient will consume trials of ice chips with minimal overt s/s of aspiration in 50% of trials with Min A verbal cues to assess readiness for repeat MBS.  SLP Short Term Goal 4 (Week 1): Patient will perform pharyngeal strengthening exercises with Mod A verbal cues.  SLP Short Term Goal 5 (Week 1): Patient will demonstrate functional problem solving for mildly complex tasks with supervision verbal cues.  SLP Short Term Goal 6 (Week 1): Patient will identify 2 impairments that put her at a high aspiration risk with Min A verbal cues.   Skilled Therapeutic Interventions: Skilled treatment session focused on dysphagia and cognitive goals. SLP facilitated session by providing oral care via the suction toothbrush prior to initiation of trials of ice chips. Patient self-fed trials and demonstrated overt cough X 1, suspect due to delayed swallow initiation and intermittent decreased awareness of bolus. SLP provided Max A verbal cues for patient to utilize a chin tuck throughout trials to maximize timing in anticipation of repeat MBS tomorrow. SLP also provided overall Mod A verbal cues for use of small bites/sips and a slow rate of self-feeding to maximize safety with current diet of Dys. 1 textures with pudding thick liquids. Patient required overall Mod A verbal cues for sustained attention to task due to verbosity.  Recommend patient continue current diet with repeat MBS tomorrow to assess for possible upgrade. Patient left upright in bed with all needs within reach. Continue with current plan of care.      Pain Pain Assessment Pain Scale: 0-10 Pain Score: 0-No pain  Therapy/Group: Individual Therapy  Audrea Bolte 12/26/2017, 12:49 PM

## 2017-12-26 NOTE — Progress Notes (Signed)
Occupational Therapy Session Note  Patient Details  Name: Alexine Pilant MRN: [id number] Date of Birth: [birth year]  Today's Date: 12/26/2017 OT Individual Time: 1100-1158 OT Individual Time Calculation (min): 58 min    Short Term Goals: Week 1:  OT Short Term Goal 1 (Week 1): Pt will be able to tolerate standing with LRAD for 2 min at a time to engage in LB self care.  OT Short Term Goal 2 (Week 1): Pt will wash UB with set up. OT Short Term Goal 3 (Week 1): Pt will wash LB with min A. OT Short Term Goal 4 (Week 1): Pt will transfer to toilet with min A.  OT Short Term Goal 5 (Week 1): Pt will tolerate being upright for at least 30 min to enable her to be able to shower.   Skilled Therapeutic Interventions/Progress Updates:    Pt received in bed with pt stating "OH I feel just terrible".  She had great difficulty expressing what was wrong. She kept expressing that she was dizzy. Pt laying at 20 degrees in bed, slowly elevated bed to 40 then 60 to have pt get adjusted to sitting upright.  Pt sat to EOB with S, but was very distracted by how poorly she felt. Worked on deep relaxation breathing and then AROM of B knee extension. Pt agreeable to trying a stand pivot to [facility] to see if she could tolerate sitting on it.  Mod-max to stand pivot to [facility] as pt was not able to fully utilize her legs.  Pt tolerated sitting for 5 min but did not need to toilet. Transferred back to bed and pt wanted to lay flat immediately.   From supine, worked on general strengthening of hip bridges, single straight leg hip flexion with other knee bent,  Chest presses with 1# dowel and overhead stretch and lat pull down with dowel bar for 15 reps of each exercise for 2 sets.   Pt resting in bed with alarm set and all needs met.    Therapy Documentation Precautions:  Precautions Precautions: Fall, Other (comment) Precaution Comments: watch BP, dizziness  Restrictions Weight Bearing Restrictions: No      Pain: Pain  Assessment Pain Scale: 0-10 Pain Score: 0-No pain ADL: ADL Grooming: Minimal cueing, Minimal assistance Where Assessed-Grooming: Bed level Upper Body Bathing: Maximal assistance Where Assessed-Upper Body Bathing: Bed level Lower Body Bathing: Dependent Where Assessed-Lower Body Bathing: Bed level Upper Body Dressing: Maximal assistance Where Assessed-Upper Body Dressing: Bed level Lower Body Dressing: Dependent Where Assessed-Lower Body Dressing: Bed level Toileting: Not assessed Toilet Transfer: Not assessed Tub/Shower Transfer: Not assessed Walk-In Shower Transfer: Not assessed   Therapy/Group: Individual Therapy  , 12/26/2017, 12:45 PM

## 2017-12-27 ENCOUNTER — Inpatient Hospital Stay (HOSPITAL_COMMUNITY): Payer: Medicare Other

## 2017-12-27 ENCOUNTER — Encounter (HOSPITAL_COMMUNITY): Payer: Medicare Other | Admitting: Speech Pathology

## 2017-12-27 ENCOUNTER — Inpatient Hospital Stay (HOSPITAL_COMMUNITY): Payer: Medicare Other | Admitting: Physical Therapy

## 2017-12-27 ENCOUNTER — Inpatient Hospital Stay (HOSPITAL_COMMUNITY): Payer: Self-pay | Admitting: Physical Therapy

## 2017-12-27 ENCOUNTER — Inpatient Hospital Stay (HOSPITAL_COMMUNITY): Payer: Medicare Other | Admitting: Occupational Therapy

## 2017-12-27 ENCOUNTER — Inpatient Hospital Stay (HOSPITAL_COMMUNITY): Payer: Self-pay

## 2017-12-27 LAB — CREATININE, SERUM: CREATININE: 0.75 mg/dL (ref 0.44–1.00)

## 2017-12-27 MED ORDER — CLONIDINE HCL 0.1 MG PO TABS: 0.1000 mg | ORAL_TABLET | Freq: Three times a day (TID) | ORAL | Status: DC | PRN

## 2017-12-27 NOTE — Progress Notes (Signed)
[place] PHYSICAL MEDICINE & REHABILITATION PROGRESS NOTE   Subjective/Complaints:  HA improved, has used tramadol 1-2 times per day  Pt feels dizziness worse when going from supine to sit especially after clonidine Has tried clonidine inpast with similar effect   ROS- no CP, SOB, N/V/D Objective:   No results found. No results for input(s): WBC, HGB, HCT, PLT in the last 72 hours. Recent Labs    12/27/17 0509  CREATININE 0.75    Intake/Output Summary (Last 24 hours) at 12/27/2017 0857 Last data filed at 12/27/2017 0640 Gross per 24 hour  Intake 1784.97 ml  Output 1800 ml  Net -15.03 ml     Physical Exam: Vital Signs Blood pressure (!) 163/58, pulse 61, temperature 98 F (36.7 C), resp. rate 16, height 5\' 2"  (1.575 m), weight 58.2 kg, SpO2 98 %.   General: No acute distress Mood and affect are appropriate Heart: Regular rate and rhythm no rubs murmurs or extra sounds Lungs: Clear to auscultation, breathing unlabored, no rales or wheezes Abdomen: Positive bowel sounds, soft nontender to palpation, nondistended Extremities: No clubbing, cyanosis, or edema Skin: No evidence of breakdown, no evidence of rash Neurologic: Cranial nerves II through XII intact, motor strength is 4/5 in bilateral deltoid, bicep, tricep, grip, hip flexor, knee extensors, ankle dorsiflexor and plantar flexor Sensory exam normal sensation to light touch  in bilateral upper and lower extremities Cerebellar exam mild right  and moderate left l finger to nose to finger , min Right and mild left heel to shin dysmetria Musculoskeletal: Full range of motion in all 4 extremities. No joint swelling Neuro exam unchanged 11/10  Assessment/Plan: 1. 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  Physiatrist is providing close team supervision and 24 hour management of active medical  problems listed below.  Physiatrist and rehab team continue to assess barriers to discharge/monitor patient progress toward functional and medical goals  Care Tool:  Bathing  Bathing activity did not occur: Refused Body parts bathed by patient: Right arm, Left arm, Right upper leg, Left upper leg, Face   Body parts bathed by helper: Front perineal area, Buttocks, Left lower leg, Right lower leg Body parts n/a: Chest, Abdomen   Bathing assist Assist Level: Moderate Assistance - Patient 50 - 74%     Upper Body Dressing/Undressing Upper body dressing Upper body dressing/undressing activity did not occur (including orthotics): Refused What is the patient wearing?: Pull over shirt    Upper body assist Assist Level: Moderate Assistance - Patient 50 - 74%    Lower Body Dressing/Undressing Lower body dressing    Lower body dressing activity did not occur: Refused What is the patient wearing?: Pants, Incontinence brief     Lower body assist Assist for lower body dressing: Moderate Assistance - Patient 50 - 74%     Toileting Toileting    Toileting assist Assist for toileting: Moderate Assistance - Patient 50 - 74%     Transfers Chair/bed transfer  Transfers assist  Chair/bed transfer activity did not occur: Safety/medical concerns  Chair/bed transfer assist level: Moderate Assistance - Patient 50 - 74%     Locomotion Ambulation   Ambulation assist   Ambulation activity did not occur: Safety/medical concerns(dizziness/BP )  Assist level: Maximal Assistance - Patient 25 - 49%   Max distance: 3   Walk 10 feet activity   Assist  Walk 10 feet activity did not occur: Safety/medical concerns(dizziness/BP )  Walk 50 feet activity   Assist Walk 50 feet with 2 turns activity did not occur: Safety/medical concerns(dizziness/BP )         Walk 150 feet activity   Assist Walk 150 feet activity did not occur: Safety/medical concerns(dizziness/BP )          Walk 10 feet on uneven surface  activity   Assist Walk 10 feet on uneven surfaces activity did not occur: Safety/medical concerns(dizziness/BP)         Wheelchair     Assist Will patient use wheelchair at discharge?: (TBD ) Type of Wheelchair: (TBD) Wheelchair activity did not occur: Safety/medical concerns(dizziness/BP)         Wheelchair 50 feet with 2 turns activity    Assist    Wheelchair 50 feet with 2 turns activity did not occur: Safety/medical concerns(dizziness/BP)       Wheelchair 150 feet activity     Assist Wheelchair 150 feet activity did not occur: Safety/medical concerns(dizziness/BP)          Medical Problem List and Plan: 1.Decreased functional mobility with dysarthria/dysphagiasecondary to 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 DVT Prophylaxis/Anticoagulation: subcutaneous Lovenox. Monitor for any bleeding episodes 3. Pain Management:Tylenol as needed 4. Mood:Ativan 1 mg 3 times a day as needed 5. Neuropsych: This patientiscapable of making decisions on herown behalf. 6. Skin/Wound Care:routine skin checks 7. Fluids/Electrolytes/Nutrition:Encourage PO intake.  -Routine in and out's with follow-up chemistries 8.Dysphagia. Dysphagia #1 pudding thick liquids. Monitor hydration. Follow-up speech therapy- hopes to be advanced this week BUN ok 11/6  9.PermissiveHypertension.Tenormin 25 mg daily. Monitor with increased mobility Vitals:   12/27/17 0539 12/27/17 0541  BP: (!) 168/61 (!) 163/58  Pulse: 62 61  Resp: 16 16  Temp: 98 F (36.7 C)   SpO2: 95% 98%  per neuro desired range 130-160 systolic, orthostatic drop noted, will change parameter for clonidine , add TED and abd binder  10.Hypothyroidism. Synthroid 11. Hyperlipidemia. Zetia 12.Diet-controlled diabetes mellitus. Hemoglobin A1c 6.5. CBGs discontinued 13.  HypoK- no diuretic, likely nutritional add supplement recheck in am LOS: 14.  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  Nausea likely due to CVA add Zofran prn,  16.  Hx migraine, worsened since CVA used tenormin at home added topamax- increased to  50mg  bid, prn tramadol added as well 7 days A FACE TO FACE EVALUATION WAS PERFORMED  Erick Colace 12/27/2017, 8:57 AM

## 2017-12-27 NOTE — Progress Notes (Signed)
Physical Therapy Session Note  Patient Details  Name: Brandy GarnetBarbara Flowers MRN: [id number] Date of Birth: 10/15/1940  Today's Date: 12/27/2017 PT Individual Time: [id number] PT Individual Time Calculation (min): 52 min   Short Term Goals: Week 1:  PT Short Term Goal 1 (Week 1): Patient to tolerate sitting at EOB for at least 10 minutes to allow for improved participation in activities  PT Short Term Goal 2 (Week 1): [facility]  PT Short Term Goal 3 (Week 1): Patient to be able to ambulate at least 7030ft with LRAD, MinA   Skilled Therapeutic Interventions/Progress Updates:   Pt in supine and agreeable to therapy, no c/o pain and reports her dizziness is improved but remains constant. Session focused on OOB tolerance. Transferred to EOB w/ min assist and to w/c via stand pivot w/ mod assist and verbal/tactile cues for technique. BP 125/59 in w/c, no increase in dizziness w/ mobility, donned TED hose per RN request. Instructed pt in self-propelling w/c using BUEs for independence w/ functional mobility and to increase BP w/ physical activity. Self-propelled 50' w/ min assist overall for steering and verbal cues for technique. Total assist remainder of way 2/2 fatigue. Performed puzzle while seated in w/c to work on upright activity tolerance, completed w/ independence and increased time. Discussed importance of upright activity and engaging her brain to perform cognitive tasks to prevent further functional decline w/ prolonged immobility. BP 121/53 after sitting up in w/c for 25 minutes. Returned to room, total assist in w/c. Mod assist transfer back to EOB and to supine, BP 145/58 once in supine. Ended session in supine, all needs in reach.   Therapy Documentation Precautions:  Precautions Precautions: Fall, Other (comment) Precaution Comments: watch BP, dizziness  Restrictions Weight Bearing Restrictions: No  Therapy/Group: Individual Therapy   Ameliyah Sarno Melton KrebsK  [name] 12/27/2017, 11:29 AM

## 2017-12-27 NOTE — Progress Notes (Signed)
Occupational Therapy Session Note  Patient Details  Name: Brandy GarnetBarbara Flowers MRN: [id number] Date of Birth: 08/09/1940  Today's Date: 12/27/2017 OT Individual Time: 8295-62131308-1405 OT Individual Time Calculation (min): 57 min    Short Term Goals: Week 1:  OT Short Term Goal 1 (Week 1): Pt will be able to tolerate standing with LRAD for 2 min at a time to engage in LB self care.  OT Short Term Goal 2 (Week 1): Pt will wash UB with set up. OT Short Term Goal 3 (Week 1): Pt will wash LB with min A. OT Short Term Goal 4 (Week 1): Pt will transfer to toilet with min A.  OT Short Term Goal 5 (Week 1): Pt will tolerate being upright for at least 30 min to enable her to be able to shower.   Skilled Therapeutic Interventions/Progress Updates:    Treatment session with focus on activity tolerance.  Pt provided therapist with explanation of BP issues from this date but willing to get up to EOB but not OOB at this time.  Pt declined bathing or dressing this session.  Donned TEDS and hospital socks prior to sitting up.  Completed bed mobility with min assist to come to EOB.  Engaged in word search activity seated EOB with intermittent min/tactile cues for sitting balance due to LOB to Rt in unsupported sitting.  BP 137/46 in sitting at EOB with pt reports mild dizziness but wanting to complete activity.  Pt tolerated sitting 20 mins while completing table top activity with reports of steady dizziness and requesting to return to supine.  BP assessed prior to return to supine 126/59.  Pt incontinent of bladder during seated activity, therefore completed hygiene and donned clean brief at bed level with pt completing rolling with supervision.  Pt passed off to PT.  Therapy Documentation Precautions:  Precautions Precautions: Fall, Other (comment) Precaution Comments: watch BP, dizziness  Restrictions Weight Bearing Restrictions: No General:   Vital Signs: Therapy Vitals Temp: 98.2 F (36.8 C) Pulse Rate:  61 Resp: 18 BP: (!) 144/55 Patient Position (if appropriate): Lying Oxygen Therapy O2 Device: Room Air Pain: Pain Assessment Pain Scale: 0-10 Pain Score: 7  Pain Type: Acute pain Pain Location: Head Pain Orientation: Anterior Pain Descriptors / Indicators: Aching Pain Frequency: Intermittent Pain Onset: On-going Patients Stated Pain Goal: 2 Pain Intervention(s): Medication (See eMAR) Multiple Pain Sites: No   Therapy/Group: Individual Therapy  Rosalio LoudHOXIE, Nakima Fluegge 12/27/2017, 3:42 PM

## 2017-12-27 NOTE — Progress Notes (Signed)
Physical Therapy Session Note  Patient Details  Name: Brandy GarnetBarbara Hardigree MRN: [id number] Date of Birth: 06/18/1940  Today's Date: 12/27/2017 PT Individual Time: 0800-0840 PT Individual Time Calculation (min): 40 min   Short Term Goals: Week 1:  PT Short Term Goal 1 (Week 1): Patient to tolerate sitting at EOB for at least 10 minutes to allow for improved participation in activities  PT Short Term Goal 2 (Week 1): [facility]  PT Short Term Goal 3 (Week 1): Patient to be able to ambulate at least 1630ft with LRAD, MinA   Skilled Therapeutic Interventions/Progress Updates:    Pt received seated in bed finishing breakfast with NT. Pt reports feeling more dizzy this AM than on previous days, perseverates on receiving BP meds this AM and how they are the cause of her dizziness. Education with patient about dizziness following CVA due to vestibular issues, decreased time spent out of bed, etc. Seated BP 158/57, HR 70 while seated in bed. Provided Supervision assist for pt to finish eating breakfast. Seated in bed to sitting EOB with min A for trunk control. Sitting BP 142/50, HR 67. Pt has increase in dizziness sitting EOB, tolerates sitting about 2 min before requesting to lay back down. Sit to supine min A. Pt left semi-reclined in bed with needs in reach, bed alarm in place.  Therapy Documentation Precautions:  Precautions Precautions: Fall, Other (comment) Precaution Comments: watch BP, dizziness  Restrictions Weight Bearing Restrictions: No   Therapy/Group: Individual Therapy  Peter Congoaylor Anniece Bleiler, PT, DPT  12/27/2017, 8:41 AM

## 2017-12-27 NOTE — Progress Notes (Signed)
Physical Therapy Session Note  Patient Details  Name: Brandy GarnetBarbara Flowers MRN: [id number] Date of Birth: 04/23/1940  Today's Date: 12/27/2017 PT Individual Time: [id number] PT Individual Time Calculation (min): 40 min   Short Term Goals: Week 1:  PT Short Term Goal 1 (Week 1): Patient to tolerate sitting at EOB for at least 10 minutes to allow for improved participation in activities  PT Short Term Goal 2 (Week 1): [facility]  PT Short Term Goal 3 (Week 1): Patient to be able to ambulate at least 1730ft with LRAD, MinA   Skilled Therapeutic Interventions/Progress Updates:    Pt supine in bed working with OT upon PT arrival, agreeable to therapy tx and reports headache 8/10, RN bringing pain medicine. Pt reports that she was feeling better this morning but is not feeling well now. Pt agreeable to bedside therapies. Pt transferred from supine>sitting EOB with min assist, rates dizziness 6/10. Pt's blood pressure in sitting 142/60. Pt worked on upright sitting tolerance this session and able to sit EOB 25+ minutes this session. Pt drinking ginger ale this session with supervision and verbal cues for chin tuck. Pt seated EOB performed 2 x 10 LAQ, 2 x 10 seated marches and x 20 ankle pumps with verbal cues for techniques. Pt seated EOB performed X1 vestibular exercises 2 x 30 sec with horizontal head turns and 2 x 30 sec vertical head turns. Therapist performed soft tissue release to upper traps this session and instructed pt in upper trap stretches x 30 sec per side. Pt transferred to supine mod assist and left with needs in reach and bed alarm set.   Therapy Documentation Precautions:  Precautions Precautions: Fall, Other (comment) Precaution Comments: watch BP, dizziness  Restrictions Weight Bearing Restrictions: No   Therapy/Group: Individual Therapy  Cresenciano GenreEmily van Schagen, PT, DPT 12/27/2017, 8:00 AM

## 2017-12-27 NOTE — Progress Notes (Signed)
Modified Barium Swallow Progress Note  Patient Details  Name: Brandy Flowers MRN: [id number] Date of Birth: 06-10-1940  Today's Date: 12/27/2017  Modified Barium Swallow completed.  Full report located under Chart Review in the Imaging Section.  Brief recommendations include the following:  Clinical Impression  Patient demonstrates an improved swallowing function and now presents with a mild oropharyngeal dysphagia. Oral phase is marked by decreased bolus cohesion resulting in lingual residue. Pharyngeal phase is marked by delayed swallow initiation to the pyriform sinuses and reduced laryngeal closure resulting in 1 episode of sensed trace aspiration and intermittent trace penetration of thin liquids.  However, episodes were eliminated with use of a chin tuck and a straw to facilitate coordination and timing.  Mild vallecular residue remained post swallow with solids due to base of tongue weakness that were reduced with multiple swallows.  SLP attempted to tax patient throughout study. Therefore, patient consumed sequential sips of thin liquids with without a chin tuck and with mixed consistencies resulting in only trace penetration that patient was able to clear. Recommend patient upgrade to Dys. 2 textures with thin liquids with use of a chin tuck and full supervision to maximize safety and carryover of strategies.  Patient educated on recommendations and verbalized understanding but will need reinforcement.    Swallow Evaluation Recommendations       SLP Diet Recommendations: Dysphagia 2 (Fine chop) solids;Thin liquid   Liquid Administration via: Straw   Medication Administration: Crushed with puree   Supervision: Patient able to self feed;Full supervision/cueing for compensatory strategies   Compensations: Slow rate;Small sips/bites;Minimize environmental distractions;Chin tuck;Use straw to facilitate chin tuck   Postural Changes: Remain semi-upright after after feeds/meals  (Comment);Seated upright at 90 degrees   Oral Care Recommendations: Oral care BID        Shametra Cumberland 12/27/2017,4:00 PM

## 2017-12-28 ENCOUNTER — Inpatient Hospital Stay (HOSPITAL_COMMUNITY): Payer: Medicare Other | Admitting: Occupational Therapy

## 2017-12-28 ENCOUNTER — Inpatient Hospital Stay (HOSPITAL_COMMUNITY): Payer: Self-pay

## 2017-12-28 ENCOUNTER — Inpatient Hospital Stay (HOSPITAL_COMMUNITY): Payer: Medicare Other | Admitting: Speech Pathology

## 2017-12-28 ENCOUNTER — Inpatient Hospital Stay (HOSPITAL_COMMUNITY): Payer: Medicare Other

## 2017-12-28 DIAGNOSIS — E876 Hypokalemia: Secondary | ICD-10-CM

## 2017-12-28 DIAGNOSIS — R0989 Other specified symptoms and signs involving the circulatory and respiratory systems: Secondary | ICD-10-CM

## 2017-12-28 LAB — BASIC METABOLIC PANEL
Anion gap: 5 (ref 5–15)
BUN: 12 mg/dL (ref 8–23)
CHLORIDE: 108 mmol/L (ref 98–111)
CO2: 20 mmol/L — AB (ref 22–32)
CREATININE: 0.79 mg/dL (ref 0.44–1.00)
Calcium: 9.1 mg/dL (ref 8.9–10.3)
GFR calc non Af Amer: >60 mL/min (ref >60)
Glucose, Bld: 127 mg/dL — ABNORMAL HIGH (ref 70–99)
Potassium: 4.3 mmol/L (ref 3.5–5.1)
SODIUM: 133 mmol/L — AB (ref 135–145)

## 2017-12-28 NOTE — Progress Notes (Signed)
Occupational Therapy Session Note  Patient Details  Name: Brandy Flowers MRN: [id number] Date of Birth: 10-16-1940  Today's Date: 12/28/2017 OT Individual Time: 1400-1430 OT Individual Time Calculation (min): 30 min    Short Term Goals: Week 1:  OT Short Term Goal 1 (Week 1): Pt will be able to tolerate standing with LRAD for 2 min at a time to engage in LB self care.  OT Short Term Goal 1 - Progress (Week 1): Progressing toward goal OT Short Term Goal 2 (Week 1): Pt will wash UB with set up. OT Short Term Goal 2 - Progress (Week 1): Met OT Short Term Goal 3 (Week 1): Pt will wash LB with min A. OT Short Term Goal 3 - Progress (Week 1): Progressing toward goal OT Short Term Goal 4 (Week 1): Pt will transfer to toilet with min A.  OT Short Term Goal 4 - Progress (Week 1): Progressing toward goal OT Short Term Goal 5 (Week 1): Pt will tolerate being upright for at least 30 min to enable her to be able to shower.  OT Short Term Goal 5 - Progress (Week 1): Progressing toward goal  Skilled Therapeutic Interventions/Progress Updates:    Pt asleep upon arrival but easily aroused.  Pt agreeable to therapy and sat EOB with min A.  Pt sat EOB to don pants, requiring assistance to thread LLE into pants.  Pt engaged in sit<>stand X 4.  Focus on sit<>stand and standing tolerance (3 mins, 1 min X 3). Pt reported increased dizziness after sitting the last time.  Pt returned to bed and remained in bed with all needs within reach and bed alarm activated.   Therapy Documentation Precautions:  Precautions Precautions: Fall, Other (comment) Precaution Comments: watch BP, dizziness  Restrictions Weight Bearing Restrictions: No  Pain: Pt denies pain  Therapy/Group: Individual Therapy  Leroy Libman 12/28/2017, 2:37 PM

## 2017-12-28 NOTE — Progress Notes (Signed)
[facility] PHYSICAL MEDICINE & REHABILITATION PROGRESS NOTE   Subjective/Complaints: Patient seen laying in bed this morning.  She states she did not sleep well overnight because she was consistently disturbed by people coming in and out of her room.  ROS: Denies CP, SOB, N/V/D  Objective:   Dg Swallowing Func-speech Pathology  Result Date: 12/27/2017 Objective Swallowing Evaluation: Type of Study: MBS-Modified Barium Swallow Study  Patient Details Name: Deneen Slager MRN: [id number] Date of Birth: [birth year] Today's Date: 12/27/2017 Time: SLP Start Time (ACUTE ONLY): 0945 -SLP Stop Time (ACUTE ONLY): 1020 SLP Time Calculation (min) (ACUTE ONLY): 35 min Past Medical History: Past Medical History: Diagnosis Date . Anxiety  . Arthritis  . Diabetes mellitus without complication (HCC)  . Hypertension  . Sinus problem  . Thyroid disease  Past Surgical History: Past Surgical History: Procedure Laterality Date . BREAST SURGERY   . EYE SURGERY   HPI: See H&P  Subjective: pt alert, worried about feeling weak Assessment / Plan / Recommendation CHL IP CLINICAL IMPRESSIONS 12/27/2017 Clinical Impression Patient demonstrates an improved swallowing function and now presents with a mild oropharyngeal dysphagia. Oral phase is marked by decreased bolus cohesion resulting in lingual residue. Pharyngeal phase is marked by delayed swallow initiation to the pyriform sinuses and reduced laryngeal closure resulting in 1 episode of sensed trace aspiration and intermittent trace penetration of thin liquids.  However, episodes were eliminated with use of a chin tuck and a straw to facilitate coordination and timing.  Mild vallecular residue remained post swallow with solids due to base of tongue weakness that were reduced with multiple swallows.  SLP attempted to tax patient throughout study. Therefore, patient consumed sequential sips of thin liquids with without a chin tuck and with mixed consistencies resulting in only  trace penetration that patient was able to clear. Recommend patient upgrade to Dys. 2 textures with thin liquids with use of a chin tuck and full supervision to maximize safety and carryover of strategies.  Patient educated on recommendations and verbalized understanding but will need reinforcement.  SLP Visit Diagnosis Dysphagia, oropharyngeal phase (R13.12) Attention and concentration deficit following -- Frontal lobe and executive function deficit following -- Impact on safety and function Mild aspiration risk;Moderate aspiration risk   CHL IP TREATMENT RECOMMENDATION 12/27/2017 Treatment Recommendations Therapy as outlined in treatment plan below   Prognosis 12/27/2017 Prognosis for Safe Diet Advancement Good Barriers to Reach Goals -- Barriers/Prognosis Comment -- CHL IP DIET RECOMMENDATION 12/27/2017 SLP Diet Recommendations Dysphagia 2 (Fine chop) solids;Thin liquid Liquid Administration via Straw Medication Administration Crushed with puree Compensations Slow rate;Small sips/bites;Minimize environmental distractions;Chin tuck;Use straw to facilitate chin tuck Postural Changes Remain semi-upright after after feeds/meals (Comment);Seated upright at 90 degrees   CHL IP OTHER RECOMMENDATIONS 12/27/2017 Recommended Consults -- Oral Care Recommendations Oral care BID Other Recommendations --   CHL IP FOLLOW UP RECOMMENDATIONS 12/27/2017 Follow up Recommendations Inpatient Rehab   CHL IP FREQUENCY AND DURATION 12/27/2017 Speech Therapy Frequency (ACUTE ONLY) min 5x/week Treatment Duration 2 weeks      CHL IP ORAL PHASE 12/27/2017 Oral Phase Impaired Oral - Pudding Teaspoon -- Oral - Pudding Cup -- Oral - Honey Teaspoon NT Oral - Honey Cup NT Oral - Nectar Teaspoon Lingual/palatal residue Oral - Nectar Cup Lingual/palatal residue Oral - Nectar Straw -- Oral - Thin Teaspoon Lingual/palatal residue;Decreased bolus cohesion Oral - Thin Cup Lingual/palatal residue;Decreased bolus cohesion Oral - Thin Straw Decreased  bolus cohesion;Lingual/palatal residue Oral - Puree Lingual/palatal residue Oral - Mech Soft Lingual/palatal  residue Oral - Regular -- Oral - Multi-Consistency -- Oral - Pill -- Oral Phase - Comment --  CHL IP PHARYNGEAL PHASE 12/27/2017 Pharyngeal Phase Impaired Pharyngeal- Pudding Teaspoon -- Pharyngeal -- Pharyngeal- Pudding Cup -- Pharyngeal -- Pharyngeal- Honey Teaspoon NT Pharyngeal -- Pharyngeal- Honey Cup NT Pharyngeal -- Pharyngeal- Nectar Teaspoon Delayed swallow initiation-vallecula Pharyngeal -- Pharyngeal- Nectar Cup Delayed swallow initiation-pyriform sinuses;Penetration/Aspiration during swallow;Compensatory strategies attempted (with notebox);Reduced airway/laryngeal closure Pharyngeal Material enters airway, remains ABOVE vocal cords and not ejected out Pharyngeal- Nectar Straw -- Pharyngeal -- Pharyngeal- Thin Teaspoon Delayed swallow initiation-pyriform sinuses;Penetration/Aspiration during swallow;Reduced airway/laryngeal closure Pharyngeal Material enters airway, passes BELOW cords then ejected out Pharyngeal- Thin Cup Delayed swallow initiation-pyriform sinuses;Reduced airway/laryngeal closure;Compensatory strategies attempted (with notebox) Pharyngeal Material enters airway, CONTACTS cords and then ejected out;Material does not enter airway Pharyngeal- Thin Straw Delayed swallow initiation-pyriform sinuses;Compensatory strategies attempted (with notebox);Penetration/Aspiration during swallow Pharyngeal Material does not enter airway;Material enters airway, CONTACTS cords and then ejected out Pharyngeal- Puree Delayed swallow initiation-vallecula;Reduced tongue base retraction;Pharyngeal residue - valleculae Pharyngeal -- Pharyngeal- Mechanical Soft Delayed swallow initiation-vallecula;Reduced tongue base retraction;Pharyngeal residue - valleculae Pharyngeal -- Pharyngeal- Regular -- Pharyngeal -- Pharyngeal- Multi-consistency -- Pharyngeal -- Pharyngeal- Pill -- Pharyngeal -- Pharyngeal  Comment --  CHL IP CERVICAL ESOPHAGEAL PHASE 12/27/2017 Cervical Esophageal Phase Impaired Pudding Teaspoon -- Pudding Cup -- Honey Teaspoon -- Honey Cup -- Nectar Teaspoon -- Nectar Cup -- Nectar Straw -- Thin Teaspoon -- Thin Cup -- Thin Straw -- Puree -- Mechanical Soft -- Regular -- Multi-consistency -- Pill -- Cervical Esophageal Comment barium retention in proximal esophagus (no radiologist present to confirm) PAYNE, COURTNEY 12/27/2017, 3:59 PM    Feliberto Gottron, MA, CCC-SLP [phone]           No results for input(s): WBC, HGB, HCT, PLT in the last 72 hours. Recent Labs    12/27/17 0509 12/28/17 0449  NA  --  133*  K  --  4.3  CL  --  108  CO2  --  20*  GLUCOSE  --  127*  BUN  --  12  CREATININE 0.75 0.79  CALCIUM  --  9.1    Intake/Output Summary (Last 24 hours) at 12/28/2017 0848 Last data filed at 12/27/2017 1741 Gross per 24 hour  Intake 50 ml  Output -  Net 50 ml     Physical Exam: Vital Signs Blood pressure 130/60, pulse 68, temperature 98.3 F (36.8 C), temperature source Oral, resp. rate 16, height 5\' 2"  (1.575 m), weight 58.2 kg, SpO2 97 %. Constitutional: No distress . Vital signs reviewed. HENT: Normocephalic.  Atraumatic. Eyes: EOMI. No discharge. Cardiovascular: RRR. No JVD. Respiratory: CTA Bilaterally. Normal effort. GI: BS +. Non-distended. Musc: No edema or tenderness in extremities. Neurologic:  Motor: 4/5 in bilateral deltoid, bicep, tricep, grip Right lower extremity: hip flexor 3-/5, knee extensors 4-/5, ankle dorsiflexor 4+/5 Left lower extremity: hip flexor 3/5, knee extensors 4/5, ankle dorsiflexor 4+/5 Skin: Warm and dry.  Intact.  Assessment/Plan: 1. 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  Physiatrist is providing close team supervision and 24 hour management of active medical problems listed below.  Physiatrist  and rehab team continue to assess barriers to discharge/monitor patient progress toward functional and medical goals  Care Tool:  Bathing  Bathing activity did not occur: Refused Body parts bathed by patient: Right arm, Left arm, Right upper leg, Left upper leg, Face  Body parts bathed by helper: Front perineal area, Buttocks, Left lower leg, Right lower leg Body parts n/a: Chest, Abdomen   Bathing assist Assist Level: Moderate Assistance - Patient 50 - 74%     Upper Body Dressing/Undressing Upper body dressing Upper body dressing/undressing activity did not occur (including orthotics): Refused What is the patient wearing?: Pull over shirt    Upper body assist Assist Level: Moderate Assistance - Patient 50 - 74%    Lower Body Dressing/Undressing Lower body dressing    Lower body dressing activity did not occur: Refused What is the patient wearing?: Pants, Incontinence brief     Lower body assist Assist for lower body dressing: Moderate Assistance - Patient 50 - 74%     Toileting Toileting    Toileting assist Assist for toileting: Moderate Assistance - Patient 50 - 74%     Transfers Chair/bed transfer  Transfers assist  Chair/bed transfer activity did not occur: Safety/medical concerns  Chair/bed transfer assist level: Moderate Assistance - Patient 50 - 74%     Locomotion Ambulation   Ambulation assist   Ambulation activity did not occur: Safety/medical concerns(dizziness/BP )  Assist level: Maximal Assistance - Patient 25 - 49%   Max distance: 3   Walk 10 feet activity   Assist  Walk 10 feet activity did not occur: Safety/medical concerns(dizziness/BP )        Walk 50 feet activity   Assist Walk 50 feet with 2 turns activity did not occur: Safety/medical concerns(dizziness/BP )         Walk 150 feet activity   Assist Walk 150 feet activity did not occur: Safety/medical concerns(dizziness/BP )         Walk 10 feet on uneven surface   activity   Assist Walk 10 feet on uneven surfaces activity did not occur: Safety/medical concerns(dizziness/BP)         Wheelchair     Assist Will patient use wheelchair at discharge?: (TBD ) Type of Wheelchair: (TBD) Wheelchair activity did not occur: Safety/medical concerns(dizziness/BP)  Wheelchair assist level: Minimal Assistance - Patient > 75% Max wheelchair distance: 7450'    Wheelchair 50 feet with 2 turns activity    Assist    Wheelchair 50 feet with 2 turns activity did not occur: Safety/medical concerns(dizziness/BP)   Assist Level: Minimal Assistance - Patient > 75%   Wheelchair 150 feet activity     Assist Wheelchair 150 feet activity did not occur: Safety/medical concerns(dizziness/BP)          Medical Problem List and Plan: 1.Decreased functional mobility with dysarthria/dysphagiasecondary to bilateral anterior pontine nonhemorrhagic infarction 12/17/17, extension on 11/6- no new neuro deficits   Cont CIR  Added scop patch to increase activity tolerance, monitor for anticholinergic side effects  Notes reviewed- stroke with extension, images reviewed- bilateral CVA, labs reviewed 2. DVT Prophylaxis/Anticoagulation: subcutaneous Lovenox. Monitor for any bleeding episodes 3. Pain Management:Tylenol as needed 4. Mood:Ativan 1 mg 3 times a day as needed 5. Neuropsych: This patientiscapable of making decisions on herown behalf. 6. Skin/Wound Care:routine skin checks 7. Fluids/Electrolytes/Nutrition:Encourage PO intake.  8.Dysphagia.   D2 thins, advance diet as tolerated 9. Hypertension.Tenormin 25 mg daily. Monitor with increased mobility Vitals:   12/28/17 0414 12/28/17 0826  BP: (!) 159/49 130/60  Pulse: (!) 59 68  Resp: 16   Temp: 98.3 F (36.8 C)   SpO2: 97%    per neuro desired range 130-160 systolic, changed parameter for clonidine   Added TED and abd binder  Labile on 11/13  10. Hypothyroidism. Synthroid 11.  Hyperlipidemia. Zetia 12.Diet-controlled diabetes mellitus. Hemoglobin A1c 6.5. CBGs discontinued 13.  HypoK- no diuretic, likely nutritional   Added supplement   Potassium 4.3 on 11/13 14.  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  Nausea likely due to CVA added Zofran prn  16.  Hx migraine, worsened since CVA used tenormin at home   Topamax increased to  50mg  bid, prn tramadol added as well  LOS: 8 days A FACE TO FACE EVALUATION WAS PERFORMED  Tammee Thielke Karis Juba 12/28/2017, 8:48 AM

## 2017-12-28 NOTE — Patient Care Conference (Signed)
Inpatient RehabilitationTeam Conference and Plan of Care Update Date: 12/28/2017   Time: 11:00 AM    Patient Name: Brandy Flowers      Medical Record Number: [id number]  Date of Birth: 08/12/1940 Sex: Female         Room/Bed: 4W26C/4W26C-01 Payor Info: Payor: MEDICARE / Plan: MEDICARE PART A AND B / Product Type: *No Product type* /    Admitting Diagnosis: B CVA  Admit Date/Time:  12/20/2017  2:48 PM Admission Comments: No comment available   Primary Diagnosis:  <principal problem not specified> Principal Problem: <principal problem not specified>  Patient Active Problem List   Diagnosis Date Noted  . Labile blood pressure   . 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 Oropharyngeal dysphagia   . Slurred speech   . Diastolic dysfunction   . Dysphagia, post-stroke   . Hypokalemia   . Stroke (cerebrum) (HCC) 12/17/2017  . Anxiety state 12/17/2017  . 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 Left renal artery stenosis (HCC) 12/05/2014  . 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 Hypertension 09/20/2013  . Hypothyroid 09/20/2013    Expected Discharge Date: Expected Discharge Date: 01/10/18  Team Members Present: Physician leading conference: Dr. 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 LBM 12/27/2017  Promote regular toileting schedule   Assist pt with toileting needs PRN    Swallow/Nutrition/ Hydration   Dys. 2 textures with thin liquids, Min A verbal cues for use of swallowing strategies   Supervision  tolerance of diet upgrade and use of strategies    ADL's   mod assist bathing and dressing as of 11/9 has refused since, min guard sitting balance due to dizziness, fatigue, and BP issues  supervision  OOB tolerance, self-care retraining at seated level, progressing to sit > stand as able   Mobility   min-mod assist overall depending on fatigue level, has not been able to take more than a few steps, remains limited in OOB tolerance 2/2 dizziness, fatigue, and BP issues  S with LRAD   OOB tolerance, all functional mobility   Communication   Supervision   Mod I  use of speech intelligibility strategies at conversation level    Safety/Cognition/ Behavioral Observations  Min A  Mod I  complex problem sovling, recall and awareness    Pain   Pt has no complaints of pain  Pain <3  Assess pain Q shift and PRN   Skin   Pt has no skin issues   Maintain skin integrity and prevent skin breakdown  Assess skin Q shift and PRN       *See Care Plan and progress notes for long and short-term goals.     Barriers to Discharge  Current Status/Progress Possible Resolutions Date Resolved   Physician    Medical stability     See above  Therapies, optimize BP/migraines/dizziness      Nursing  PT                    OT                  SLP                SW                Discharge Planning/Teaching Needs:  Pt plans on hiring assistance, seems to be moving very slow and activity tolerance is poor,self limits at times. Brother visits daily      Team Discussion:  Goals supervision, medical issues via BP and migraines along with dizziness. MD has started meds for dizziness and adjusting BP and migraine meds. Pt at times is self limiting and doesn't want to participate in therapies. Aware will need 24 hr supervision at home and feels can arrange this.  DC-IVF due to diet upgrade to Dys 2 thin. Neuro-psych following. Pt aware will need to have caregivers come in prior to her DC home  Revisions to Treatment Plan:  DC 11/26    Continued Need for Acute Rehabilitation Level of Care: The patient requires daily medical management by a physician with specialized training in physical medicine and rehabilitation for the following conditions: Daily direction of a multidisciplinary physical rehabilitation program to ensure safe treatment while eliciting the highest outcome that is of practical value to the patient.: Yes Daily medical management of patient stability for increased activity during participation in an intensive rehabilitation regime.: Yes Daily analysis of laboratory values and/or radiology reports with any subsequent need for medication adjustment of medical intervention for : Neurological problems;Blood pressure problems;Diabetes problems   I attest that I was present, lead the team conference, and concur with the assessment and plan of the team.   [name] 12/28/2017, 1:35 PM

## 2017-12-28 NOTE — Progress Notes (Signed)
Occupational Therapy Weekly Progress Note  Patient Details  Name: Brandy Flowers MRN: [id number] Date of Birth: 18-Jan-1941  Beginning of progress report period: December 21, 2017 End of progress report period: December 28, 2017  Today's Date: 12/28/2017 OT Individual Time: 4315-4008 OT Individual Time Calculation (min): 55 min    Patient has met 4 of 5 short term goals.  Pt has made very slow progress this week as she has been very self limiting. When asked to participate in bathing she often refuses or begins to cry saying she can not do things.  Pt needs a great deal of encouragement and prompting to participate.  Pt frequently cries saying, " I just dont feel right" but has difficulty fully expressing herself.  With MAX encouragement pt can sit on EOB and participate in bathing with mod A, UB dressing with min A. LB dressing with max A, BSC transfers with mod - max A depending on fatigue.  When pt's legs begin to get fatigued they buckle, so pt is safer using the stedy lift.   Patient continues to demonstrate the following deficits: muscle weakness, decreased cardiorespiratoy endurance, anxiety and decreased sitting balance, decreased standing balance and decreased balance strategies and therefore will continue to benefit from skilled OT intervention to enhance overall performance with BADL.  Patient progressing gradually toward long term goals..  Continue plan of care.  OT Short Term Goals Week 1:  OT Short Term Goal 1 (Week 1): Pt will be able to tolerate standing with LRAD for 2 min at a time to engage in LB self care.  OT Short Term Goal 1 - Progress (Week 1): Progressing toward goal OT Short Term Goal 2 (Week 1): Pt will wash UB with set up. OT Short Term Goal 2 - Progress (Week 1): Met OT Short Term Goal 3 (Week 1): Pt will wash LB with min A. OT Short Term Goal 3 - Progress (Week 1): Progressing toward goal OT Short Term Goal 4 (Week 1): Pt will transfer to toilet with min A.  OT  Short Term Goal 4 - Progress (Week 1): Progressing toward goal OT Short Term Goal 5 (Week 1): Pt will tolerate being upright for at least 30 min to enable her to be able to shower.  OT Short Term Goal 5 - Progress (Week 1): Progressing toward goal Week 2:  OT Short Term Goal 1 (Week 2): Pt will complete BSC transfers with min A of 1 person. OT Short Term Goal 2 (Week 2): Pt will be able to self cleanse post toileting with min A. OT Short Term Goal 3 (Week 2): Pt will don shirt with set up.  OT Short Term Goal 4 (Week 2): Pt will be able to tolerate standing with RW for 2 minutes to engage in LB cleansing.  OT Short Term Goal 5 (Week 2): Pt will tolerate sitting unsupported for at least 30 minutes to allow her to engage in a shower.  Skilled Therapeutic Interventions/Progress Updates:    See ADL documentation below.  Pt participated in ADLs this session. Pt left sitting in wc with chair belt alarm on with nurse tech in the room with her.   Therapy Documentation Precautions:  Precautions Precautions: Fall, Other (comment) Precaution Comments: watch BP, dizziness  Restrictions Weight Bearing Restrictions: No    Vital Signs: Therapy Vitals Pulse Rate: 68 BP: 130/60 Pain:  no c/o pain ADL: ADL Grooming: Minimal cueing, Minimal assistance Where Assessed-Grooming: Sitting at sink Upper Body Bathing: Supervision/safety Where Assessed-Upper  Body Bathing: Edge of bed Lower Body Bathing: Moderate assistance Where Assessed-Lower Body Bathing: Edge of bed Upper Body Dressing: Minimal assistance Where Assessed-Upper Body Dressing: Edge of bed Lower Body Dressing: Maximal assistance Where Assessed-Lower Body Dressing: Edge of bed Toileting: Maximal assistance Where Assessed-Toileting: Bedside Commode Toilet Transfer: Moderate assistance Toilet Transfer Method: Stand pivot Toilet Transfer Equipment: Engineer, technical sales Transfer: Not assessed Social research officer, government: Not  assessed   Therapy/Group: Individual Therapy  [place] 12/28/2017, 11:12 AM

## 2017-12-28 NOTE — Progress Notes (Signed)
Social Work Patient ID: Brandy Flowers, female   DOB: [birth year], 77 y.o.   MRN: [id number] Met with pt and spoke with brother via telephone to discuss team conference goals supervision level and her needs at discharge to have someone with her. [employer] discharge date is 11/26. She feels this is not a problem and will have someone told her will need to do caregiver education prior to discharge home. Brother thinks pt wants to go home so bad she will tell us she has caregivers at home when she does not. Have encouraged pt to participate in therapies to get the most out of her rehab experience and she plans to now her dizziness is better. Will work on a safe plan for pt.

## 2017-12-28 NOTE — Progress Notes (Signed)
Physical Therapy Session Note  Patient Details  Name: Brandy GarnetBarbara Flowers MRN: [id number] Date of Birth: 05/24/1940  Today's Date: 12/28/2017 PT Individual Time: 1000-1058 PT Individual Time Calculation (min): 58 min   Short Term Goals: Week 1:  PT Short Term Goal 1 (Week 1): Patient to tolerate sitting at EOB for at least 10 minutes to allow for improved participation in activities  PT Short Term Goal 2 (Week 1): [facility]  PT Short Term Goal 3 (Week 1): Patient to be able to ambulate at least 5930ft with LRAD, MinA   Skilled Therapeutic Interventions/Progress Updates:    Pt seated in w/c upon PT arrival, agreeable to therapy tx and reports pain 2/10 in R knee. Pt denies dizziness this session, she states she thinks the patch is helping. Pt transported to the gym. Pt ambulated x 10 ft this session with RW and mod assist, w/c follow for safety, therapist providing cues for increased R knee extension to limit buckling. Pt performed stand pivot transfer from w/c>mat with mod assist. Therapist performed soft tissue release to upper traps, assisted pt to perform upper trap stretch and pec stretch. Pt performed sit<>stand with RW and mod assist from mat, in standing pt performs x 5 mini squats with verbal cues for techniques. Pt worked on standing balance with RW while tossing horseshoes, x 2 trials with verbal/tactile cues for increased R knee extension and weightbearing over R side. Pt performed squat pivot to w/c with min assist and transported back to room. Stand pivot to bed with mod assist and sit>supine with min assist. Pt left supine in bed with needs in reach and bed alarm set. Pt tolerated OOB activity the entire session without increased dizziness.   Therapy Documentation Precautions:  Precautions Precautions: Fall, Other (comment) Precaution Comments: watch BP, dizziness  Restrictions Weight Bearing Restrictions: No   Therapy/Group: Individual Therapy  Cresenciano GenreEmily  van Schagen, PT, DPT 12/28/2017, 7:47 AM

## 2017-12-28 NOTE — Progress Notes (Signed)
NT reported patient verbalized that her brother came in for a visit . After visit completed patient talking to NT. Patient reported per NT that if she has to live like she is then she does not want to live like this. NT reports patient was upset with her brother going through her personal items from home. NT reports she provided patient with emotional support. No further discussion noted about not wanting to live. Continue to monitor. Night shift RN aware of patient's conversation with NT.  Cleotilde NeerJoyce, Jenesys Casseus S

## 2017-12-28 NOTE — Progress Notes (Signed)
Speech Language Pathology Weekly Progress and Session Note  Patient Details  Name: Brandy Flowers MRN: [id number] Date of Birth: 07-Aug-1940  Beginning of progress report period: December 21, 2017 End of progress report period: December 28, 2017  Today's Date: 12/28/2017 SLP Individual Time: 0730-0825 SLP Individual Time Calculation (min): 55 min  Short Term Goals: Week 1: SLP Short Term Goal 1 (Week 1): Patient will utilize speech intelligibility strategies at the conversation level with Min A verbal cues to achieve ~90% intelligibility.  SLP Short Term Goal 1 - Progress (Week 1): Met SLP Short Term Goal 2 (Week 1): Patient will consume current diet with minimal overt s/s of aspiration with supervision verbal cues.  SLP Short Term Goal 2 - Progress (Week 1): Met SLP Short Term Goal 3 (Week 1): Patient will consume trials of ice chips with minimal overt s/s of aspiration in 50% of trials with Min A verbal cues to assess readiness for repeat MBS.  SLP Short Term Goal 3 - Progress (Week 1): Met SLP Short Term Goal 4 (Week 1): Patient will perform pharyngeal strengthening exercises with Mod A verbal cues.  SLP Short Term Goal 4 - Progress (Week 1): Not met SLP Short Term Goal 5 (Week 1): Patient will demonstrate functional problem solving for mildly complex tasks with supervision verbal cues.  SLP Short Term Goal 5 - Progress (Week 1): Not met SLP Short Term Goal 6 (Week 1): Patient will identify 2 impairments that put her at a high aspiration risk with Min A verbal cues.  SLP Short Term Goal 6 - Progress (Week 1): Not met    New Short Term Goals: Week 2: SLP Short Term Goal 1 (Week 2): Patient will utilize speech intelligibility strategies at the conversation level with Supervision verbal cues to achieve ~90% intelligibility.  SLP Short Term Goal 2 (Week 2): Patient will consume current diet with minimal overt s/s of aspiration with Min A verbal cues for use of swallowing compensatory  strategies.  SLP Short Term Goal 3 (Week 2): Patient will demonstrate selective attention to functional tasks in a mildly distracting enviornment for ~25 minutes with Min A verbal cues for redirection.  SLP Short Term Goal 4 (Week 2): Patient will recall new, daily information with Min A verbal cues.  SLP Short Term Goal 5 (Week 2): Patient will demonstrate functional problem solving for mildly complex tasks with supervision verbal cues.   Weekly Progress Updates: Patient has made excellent gains and has met 3 of 6 STGs this reporting period. Patient had repeat MBS on 12/27/17 and was upgraded to Dys. 2 textures with thin liquids with use of a chin tuck. Patient is consuming current diet upgrade with minimal overt s/s of aspiration and requires overall Min-Mod A verbal cues for use of swallowing compensatory strategies. Patient demonstrates improved speech intelligibility at the sentence level and is ~90% intelligible with Supervision-Min A verbal cues needed for use of intelligibility strategies. Patient has made minimal progress in regards to cognitive functioning and continues to require overall Mod A verbal cues for selective attention, functional problem solving and recall.  Patient is also perseverative on taking "too much blood pressure medicine" which she reports is making her dizzy consistently throughout the day(MD aware). Patient and family education ongoing. Patient would benefit from continued skilled SLP intervention to maximize her cognitive and swallowing function and overall functional independence prior to discharge home.       Intensity: Minumum of 1-2 x/day, 30 to 90 minutes Frequency: 3 to  5 out of 7 days Duration/Length of Stay: 15-19 days Treatment/Interventions: Cognitive remediation/compensation;Dysphagia/aspiration precaution training;Internal/external aids;Speech/Language facilitation;Cueing hierarchy;Environmental controls;Therapeutic Activities;Patient/family  education;Functional tasks   Daily Session  Skilled Therapeutic Interventions: Skilled treatment session focused on dysphagia and cognitive goals. Patient independently recalled her swallowing strategies and required overall supervision verbal and visual cues for use of strategies with breakfast meal of Dys. 2 textures with thin liquids. Patient with intermittent and minimal overt s/s of aspiration, suspect due to impaired timing/coordination of swallow. Recommend patient continue current diet. Patient required Min A verbal cues for sustained attention to self-feeding and became intermittently tearful throughout session. SLP provided Max encouragement and emotional support. Patient's brother present and asking questions in regards to d/c planning. SLP answered questions and was also directed to CSW. Patient left upright in bed with alarm on and all needs within reach. Continue with current plan of care.    Pain No/Denies Pain   Therapy/Group: Individual Therapy  Beverley Allender 12/28/2017, 11:07 AM

## 2017-12-29 ENCOUNTER — Inpatient Hospital Stay (HOSPITAL_COMMUNITY): Payer: Medicare Other | Admitting: Speech Pathology

## 2017-12-29 ENCOUNTER — Inpatient Hospital Stay (HOSPITAL_COMMUNITY): Payer: Medicare Other | Admitting: Physical Therapy

## 2017-12-29 ENCOUNTER — Inpatient Hospital Stay (HOSPITAL_COMMUNITY): Payer: Medicare Other | Admitting: Occupational Therapy

## 2017-12-29 DIAGNOSIS — E871 Hypo-osmolality and hyponatremia: Secondary | ICD-10-CM

## 2017-12-29 MED ORDER — CLONAZEPAM 0.5 MG PO TABS: 0.2500 mg | ORAL_TABLET | Freq: Three times a day (TID) | ORAL | Status: DC

## 2017-12-29 MED ORDER — CLONAZEPAM 0.25 MG PO TBDP
0.2500 mg | ORAL_TABLET | Freq: Three times a day (TID) | ORAL | Status: DC
  Administered 2017-12-29 – 2017-12-31 (×6): 0.25 mg via ORAL
  Filled 2017-12-29 (×7): qty 1

## 2017-12-29 NOTE — Progress Notes (Addendum)
Occupational Therapy Session Note  Patient Details  Name: Brandy GarnetBarbara Flowers MRN: [id number] Date of Birth: 02/16/1940  Today's Date: 12/29/2017 OT Individual Time:  -  8:50-9:42 Total individual time: 52 minutes Missed 8 minutes       Short Term Goals: Week 2:  OT Short Term Goal 1 (Week 2): Pt will complete BSC transfers with min A of 1 person. OT Short Term Goal 2 (Week 2): Pt will be able to self cleanse post toileting with min A. OT Short Term Goal 3 (Week 2): Pt will don shirt with set up.  OT Short Term Goal 4 (Week 2): Pt will be able to tolerate standing with RW for 2 minutes to engage in LB cleansing.  OT Short Term Goal 5 (Week 2): Pt will tolerate sitting unsupported for at least 30 minutes to allow her to engage in a shower.  Skilled Therapeutic Interventions/Progress Updates: Upon approach for therapy, patient stated," I will try. They think I am making this up [complaints of dizziness and light headedness].   The doctors do not even believe me."  She began an ADL in bed, but quickly stated she needed to call the bank to correct the routing number with her bank to set up auto drafting.    She became flustered and stated, "I get so upset" as she tried to dial the number 3x.   Finally she reached the bank and a person and began taking care of business and making phone calls without misdialing the next attempts.   As well, nursing came in to give meds during this session.  THis session she was able to transfer supine to edge of bed with mod assist; don pants with mod assist;  Sit to stand with mod assist; doff shirt with Mod assist; don clean shirt with mod assist  Patient was assisted back to bed with max A and phone and call bel left within reach.     Therapy Documentation Precautions:  Precautions Precautions: Fall, Other (comment) Precaution Comments: watch BP, dizziness  Restrictions Weight Bearing Restrictions: No  Pain:denied    Therapy/Group: Individual  Therapy  Bud Faceickett, Jacci Ruberg [facility] 12/29/2017, 9:00 AM

## 2017-12-29 NOTE — Progress Notes (Signed)
Speech Language Pathology Daily Session Note  Patient Details  Name: Brandy GarnetBarbara Flowers MRN: [id number] Date of Birth: 08/18/1940  Today's Date: 12/29/2017 SLP Individual Time: 0720-0815 SLP Individual Time Calculation (min): 55 min  Short Term Goals: Week 2: SLP Short Term Goal 1 (Week 2): Patient will utilize speech intelligibility strategies at the conversation level with Supervision verbal cues to achieve ~90% intelligibility.  SLP Short Term Goal 2 (Week 2): Patient will consume current diet with minimal overt s/s of aspiration with Min A verbal cues for use of swallowing compensatory strategies.  SLP Short Term Goal 3 (Week 2): Patient will demonstrate selective attention to functional tasks in a mildly distracting enviornment for ~25 minutes with Min A verbal cues for redirection.  SLP Short Term Goal 4 (Week 2): Patient will recall new, daily information with Min A verbal cues.  SLP Short Term Goal 5 (Week 2): Patient will demonstrate functional problem solving for mildly complex tasks with supervision verbal cues.   Skilled Therapeutic Interventions:Skilled treatment session focused on dysphagia and cognitive goals. Patient independently recalled her swallowing strategies and required overall Min A verbal and visual cues for use of strategies with breakfast meal of Dys. 2 textures with thin liquids. Patient with intermittent and minimal overt s/s of aspiration, suspect due to impaired timing/coordination of swallow in distracting enviornment. Recommend patient continue current diet. Patient required Min A verbal cues for sustained attention to self-feeding and became intermittently tearful throughout session requiring support and extra time from clincian.  Patient's brother present and brought her mail. Patient able to independently read her mail but required Min A verbal cues for problem solving [employer](mortagage company reported they needed her routing number). Patient left upright in bed with alarm  on and all needs within reach. Continue with current plan of care.          Pain No/Denies Pain   Therapy/Group: Individual Therapy  Isahi Godwin 12/29/2017, 3:32 PM

## 2017-12-29 NOTE — Progress Notes (Signed)
Physical Therapy Weekly Progress Note  Patient Details  Name: Brandy Flowers MRN: [id number] Date of Birth: [birth year]  Beginning of progress report period: December 21, 2017 End of progress report period: December 29, 2017  Today's Date: 12/29/2017 PT Individual Time: 1300-1400 PT Individual Time Calculation (min): 60 min  and Today's Date: 12/29/2017 PT Missed Time: 15 Minutes Missed Time Reason: Patient fatigue;Patient unwilling to participate  Patient has met 1 of 3 short term goals. Pt has made limited progress in therapies over last week 2/2 dizziness, BP management issues, and self-limiting behavior affecting her OOB tolerance. When BP is regulated, dizziness is minimal and pt is able to participate w/ encouragement. She is performing all OOB mobility w/ min-mod assist consistently and ambulating short distances w/ RW.   Patient continues to demonstrate the following deficits muscle weakness, decreased cardiorespiratoy endurance, unbalanced muscle activation, decreased coordination and decreased motor planning, decreased initiation, decreased attention, decreased awareness, decreased problem solving, decreased safety awareness, decreased memory and delayed processing, central origin and decreased sitting balance, decreased standing balance, decreased postural control, hemiplegia and decreased balance strategies and therefore will continue to benefit from skilled PT intervention to increase functional independence with mobility.  Patient not progressing toward long term goals.  See goal revision..  Plan of care revisions: LTGs downgraded to CGA to supervision overall, community gait goal discontinued as anticipate pt will not be community ambulator at d/c.  PT Short Term Goals Week 1:  PT Short Term Goal 1 (Week 1): Patient to tolerate sitting at EOB for at least 10 minutes to allow for improved participation in activities  PT Short Term Goal 1 - Progress (Week 1): Progressing toward  goal PT Short Term Goal 2 (Week 1): Patient to transfer to/from [facility] with MinA  PT Short Term Goal 2 - Progress (Week 1): Met PT Short Term Goal 3 (Week 1): Patient to be able to ambulate at least 66f with LRAD, MinA  PT Short Term Goal 3 - Progress (Week 1): Partly met Week 2:  PT Short Term Goal 1 (Week 2): Pt will ambulate 25' w/ LRAD, min assist PT Short Term Goal 2 (Week 2): Pt will tolerate 30 min of OOB activity w/o increase in fatigue PT Short Term Goal 3 (Week 2): Pt will perform bed mobility w/ min assist PT Short Term Goal 4 (Week 2): Pt will maintain dynamic standing balance w/ min assist  Skilled Therapeutic Interventions/Progress Updates:   Pt in supine and had not eaten lunch yet, denies pain. Agreeable to OOB activity w/ max encouragement and education regarding long term effects of immobility and gradually increasing tolerance to upright. Pt w/ TED hose and abdominal binder donned prior to getting OOB. Min assist transfer to EOB and mod assist stand pivot to recliner. Recliner maintained in upright position and feet down to work on tolerance to upright/OOB activity. Provided set-up assist w/ lunch tray and skilled cues to chin tuck with each swallow and to swallow food prior to talking. No signs or symptoms of orthostasis w/ OOB activity this session. Provided min verbal cues to assist w/ performing cognitive activity while seated in recliner as well.  Pt continued to perseverate throughout session, talking about how doctors/clinical staff do not believe her, how she will never get better, and being too weak. Spent majority of session providing skilled education regarding stroke symptoms, symptom management, and recovery, as well as discussing how she can control her outcome and making an OOB tolerance plan. Pt agreed to sit  OOB (in recliner or w/c) while eating 1 meal outside of therapy per day. Returned to EOB and to supine w/ min-mod assist. Ended session in supine, all needs in reach.  Missed 15 min of skilled PT 2/2 fatigue/refusal.   Therapy Documentation Precautions:  Precautions Precautions: Fall, Other (comment) Precaution Comments: watch BP, dizziness  Restrictions Weight Bearing Restrictions: No  Therapy/Group: Individual Therapy  Jaliana Medellin Clent Demark 12/29/2017, 2:11 PM

## 2017-12-29 NOTE — Progress Notes (Signed)
[place] PHYSICAL MEDICINE & REHABILITATION PROGRESS NOTE   Subjective/Complaints: Patient seen laying in bed this AM.  She states she slept well overnight.  She is very upset and anxious, wanting to know why she has not gotten any better.   ROS: Denies CP, SOB, N/V/D  Objective:   No results found. No results for input(s): WBC, HGB, HCT, PLT in the last 72 hours. Recent Labs    12/27/17 0509 12/28/17 0449  NA  --  133*  K  --  4.3  CL  --  108  CO2  --  20*  GLUCOSE  --  127*  BUN  --  12  CREATININE 0.75 0.79  CALCIUM  --  9.1    Intake/Output Summary (Last 24 hours) at 12/29/2017 1251 Last data filed at 12/28/2017 1817 Gross per 24 hour  Intake 100 ml  Output -  Net 100 ml     Physical Exam: Vital Signs Blood pressure (!) 148/70, pulse 71, temperature 98.6 F (37 C), temperature source Oral, resp. rate 18, height 5\' 2"  (1.575 m), weight 58.2 kg, SpO2 96 %. Constitutional: No distress . Vital signs reviewed. HENT: Normocephalic.  Atraumatic. Eyes: EOMI. No discharge. Cardiovascular: RRR. No JVD. Respiratory: CTA bilaterally. Normal effort. GI: BS +. Non-distended. Musc: No edema or tenderness in extremities. Neurologic:  Motor: 4/5 in bilateral deltoid, bicep, tricep, grip Right lower extremity: hip flexor 3/5, knee extensors 4-/5, ankle dorsiflexor 4+/5 Left lower extremity: hip flexor 3+/5, knee extensors 4/5, ankle dorsiflexor 4+/5 Skin: Warm and dry.  Intact.  Assessment/Plan: 1. 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  Physiatrist is providing close team supervision and 24 hour management of active medical problems listed below.  Physiatrist and rehab team continue to assess barriers to discharge/monitor patient progress toward functional and medical goals  Care Tool:  Bathing  Bathing activity did not occur: Refused Body  parts bathed by patient: Right arm, Left arm, Right upper leg, Left upper leg, Face, Chest, Abdomen, Front perineal area   Body parts bathed by helper: Buttocks, Right lower leg, Left lower leg Body parts n/a: Chest, Abdomen   Bathing assist Assist Level: Moderate Assistance - Patient 50 - 74%     Upper Body Dressing/Undressing Upper body dressing Upper body dressing/undressing activity did not occur (including orthotics): Refused What is the patient wearing?: Pull over shirt    Upper body assist Assist Level: Minimal Assistance - Patient > 75%    Lower Body Dressing/Undressing Lower body dressing    Lower body dressing activity did not occur: Refused What is the patient wearing?: Pants, Incontinence brief     Lower body assist Assist for lower body dressing: Maximal Assistance - Patient 25 - 49%     Toileting Toileting Toileting Activity did not occur (Clothing management and hygiene only): Safety/medical concerns(she reported not needing to void this session and reported some incontinence)  Toileting assist Assist for toileting: Maximal Assistance - Patient 25 - 49%     Transfers Chair/bed transfer  Transfers assist  Chair/bed transfer activity did not occur: Safety/medical concerns  Chair/bed transfer assist level: Maximal Assistance - Patient 25 - 49%     Locomotion Ambulation   Ambulation assist   Ambulation activity did not occur: Safety/medical concerns(dizziness/BP )  Assist level: Moderate Assistance - Patient 50 - 74% Assistive device: Walker-rolling Max distance: 10 ft   Walk 10 feet activity  Assist  Walk 10 feet activity did not occur: Safety/medical concerns(dizziness/BP )  Assist level: Moderate Assistance - Patient - 50 - 74% Assistive device: Walker-rolling   Walk 50 feet activity   Assist Walk 50 feet with 2 turns activity did not occur: Safety/medical concerns(dizziness/BP )         Walk 150 feet activity   Assist Walk 150  feet activity did not occur: Safety/medical concerns(dizziness/BP )         Walk 10 feet on uneven surface  activity   Assist Walk 10 feet on uneven surfaces activity did not occur: Safety/medical concerns(dizziness/BP)         Wheelchair     Assist Will patient use wheelchair at discharge?: (TBD ) Type of Wheelchair: (TBD) Wheelchair activity did not occur: Safety/medical concerns(dizziness/BP)  Wheelchair assist level: Minimal Assistance - Patient > 75% Max wheelchair distance: 67'    Wheelchair 50 feet with 2 turns activity    Assist    Wheelchair 50 feet with 2 turns activity did not occur: Safety/medical concerns(dizziness/BP)   Assist Level: Minimal Assistance - Patient > 75%   Wheelchair 150 feet activity     Assist Wheelchair 150 feet activity did not occur: Safety/medical concerns(dizziness/BP)          Medical Problem List and Plan: 1.Decreased functional mobility with dysarthria/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 DVT Prophylaxis/Anticoagulation: subcutaneous Lovenox. Monitor for any bleeding episodes 3. Pain Management:Tylenol as needed 4. Mood:Ativan 1 mg 3 times a day as needed, changed to Klonopin 0.25 3 times daily on 11/14 5. Neuropsych: This patientiscapable of making decisions on herown behalf. 6. Skin/Wound Care:routine skin checks 7. Fluids/Electrolytes/Nutrition:Encourage PO intake.  8.Dysphagia.   D2 thins, advance diet as tolerated 9. Hypertension.Tenormin 25 mg daily. Monitor with increased mobility Vitals:   12/28/17 2051 12/29/17 0529  BP: (!) 168/50 (!) 148/70  Pulse: (!) 58 71  Resp: 17 18  Temp: 98.5 F (36.9 C) 98.6 F (37 C)  SpO2: 96% 96%   per neuro desired range 130-160 systolic, changed parameter for  clonidine   Added TED and abd binder  Labile on 11/14 10. Hypothyroidism. Synthroid 11. Hyperlipidemia. Zetia 12.Diet-controlled diabetes mellitus. Hemoglobin A1c 6.5. CBGs discontinued 13.  HypoK- no diuretic, likely nutritional   Added supplement   Potassium 4.3 on 11/13  Labs ordered for tomorrow 14.  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  Nausea likely due to CVA added Zofran prn  16.  Hx migraine, worsened since CVA used tenormin at home   Topamax increased to  50mg  bid, prn tramadol added as well 17.  Hyponatremia  Sodium 133 on 11/13  Labs ordered for tomorrow  LOS: 9 days A FACE TO FACE EVALUATION WAS PERFORMED  Brandy Flowers Karis Juba 12/29/2017, 12:51 PM

## 2017-12-29 NOTE — Consult Note (Signed)
Patient known to me from prior evaluation for possible renal artery stenosis.  I was asked by her brother to see her in hospital after recent pontine stroke.  Patient states that her blood pressure has been up and down sometimes poorly controlled sometimes well controlled.  She states that has been much better controlled since she has been in the hospital.  Of note she had a CT Angio of the neck which showed no significant carotid occlusive disease during this admission.  She did have a renal duplex exam which showed less than 60% stenosis of her left renal artery #[id number].  She is currently on atenolol clonidine and Cozaar for blood pressure control.  She has normal creatinine.  Current neuro deficits from her stroke include urinary retention, difficult speech, and some leg weakness.  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  I reviewed her blood pressure numbers from her recent hospital admission and rehab stay.  Over the last week her systolic has been between 130 and 170.  She did have several blood pressures in the 180s and 200s at the time of her admission.  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  It is probably worthwhile to at least repeat her duplex scan to make sure that renal artery stenosis is not contributing to her blood pressure problems.  However, noted that she is on currently 3 blood pressure medications with pretty good control now in rehab and no evidence  of renal dysfunction.   Plan: We will order a renal duplex exam for her while she is in the hospital.  I will follow-up on this after the test is been performed.  Fabienne Brunsharles Fields, MD [facility] Office: [phone] Pager: [phone]

## 2017-12-30 ENCOUNTER — Inpatient Hospital Stay (HOSPITAL_COMMUNITY): Payer: Medicare Other | Admitting: Speech Pathology

## 2017-12-30 ENCOUNTER — Inpatient Hospital Stay (HOSPITAL_COMMUNITY): Payer: Medicare Other | Admitting: Physical Therapy

## 2017-12-30 ENCOUNTER — Inpatient Hospital Stay (HOSPITAL_COMMUNITY): Payer: Medicare Other

## 2017-12-30 DIAGNOSIS — I701 Atherosclerosis of renal artery: Secondary | ICD-10-CM

## 2017-12-30 DIAGNOSIS — I160 Hypertensive urgency: Secondary | ICD-10-CM

## 2017-12-30 LAB — BASIC METABOLIC PANEL
ANION GAP: 9 (ref 5–15)
BUN: 18 mg/dL (ref 8–23)
CALCIUM: 9.3 mg/dL (ref 8.9–10.3)
CO2: 20 mmol/L — AB (ref 22–32)
CREATININE: 0.97 mg/dL (ref 0.44–1.00)
Chloride: 107 mmol/L (ref 98–111)
GFR calc Af Amer: >60 mL/min (ref >60)
GFR calc non Af Amer: 55 mL/min — ABNORMAL LOW (ref >60)
GLUCOSE: 150 mg/dL — AB (ref 70–99)
Potassium: 4 mmol/L (ref 3.5–5.1)
Sodium: 136 mmol/L (ref 135–145)

## 2017-12-30 NOTE — Progress Notes (Signed)
*  PRELIMINARY RESULTS* Vascular Ultrasound Renal Artery Duplex has been completed.   Findings suggest 1-59% renal artery stenosis bilaterally.   12/30/2017 10:39 AM Gertie FeyMichelle Rafe Mackowski, MHA, RVT, RDCS, RDMS

## 2017-12-30 NOTE — Progress Notes (Signed)
[place] PHYSICAL MEDICINE & REHABILITATION PROGRESS NOTE   Subjective/Complaints: Patient seen laying in bed this morning.  She states she slept fairly overnight.  She states she feels terrible and she is getting weaker day by day however she has significant improvement in strength as well as swallowing since admission.  Also discussed with nursing patient upset about transition from Ativan to Klonopin.  She does not believe she has issues with anxiety.  ROS: Denies CP, SOB, N/V/D  Objective:   No results found. No results for input(s): WBC, HGB, HCT, PLT in the last 72 hours. Recent Labs    12/28/17 0449 12/30/17 0459  NA 133* 136  K 4.3 4.0  CL 108 107  CO2 20* 20*  GLUCOSE 127* 150*  BUN 12 18  CREATININE 0.79 0.97  CALCIUM 9.1 9.3    Intake/Output Summary (Last 24 hours) at 12/30/2017 1146 Last data filed at 12/30/2017 1008 Gross per 24 hour  Intake 360 ml  Output -  Net 360 ml     Physical Exam: Vital Signs Blood pressure (!) 151/63, pulse 69, temperature (!) 97.5 F (36.4 C), resp. rate 18, height 5\' 2"  (1.575 m), weight 58.2 kg, SpO2 94 %. Constitutional: No distress . Vital signs reviewed. HENT: Normocephalic.  Atraumatic. Eyes: EOMI. No discharge. Cardiovascular: RRR.  No JVD. Respiratory: CTA bilaterally.  Normal effort. GI: BS +. Non-distended. Musc: No edema or tenderness in extremities. Neurologic:  Motor: 4/5 in bilateral deltoid, bicep, tricep, grip Right lower extremity: hip flexor 4--4/5, knee extensors 4/5, ankle dorsiflexor 4+/5 Left lower extremity: hip flexor 4/5, knee extensors 4-4+/5, ankle dorsiflexor 4+/5 Skin: Warm and dry.  Intact.  Assessment/Plan: 1. 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  Physiatrist is providing close team supervision and 24 hour management of active medical problems listed  below.  Physiatrist and rehab team continue to assess barriers to discharge/monitor patient progress toward functional and medical goals  Care Tool:  Bathing  Bathing activity did not occur: Refused Body parts bathed by patient: Right arm, Left arm, Right upper leg, Left upper leg, Face, Chest, Abdomen, Front perineal area   Body parts bathed by helper: Buttocks, Right lower leg, Left lower leg Body parts n/a: Chest, Abdomen   Bathing assist Assist Level: Moderate Assistance - Patient 50 - 74%     Upper Body Dressing/Undressing Upper body dressing Upper body dressing/undressing activity did not occur (including orthotics): Refused What is the patient wearing?: Pull over shirt    Upper body assist Assist Level: Minimal Assistance - Patient > 75%    Lower Body Dressing/Undressing Lower body dressing    Lower body dressing activity did not occur: Refused What is the patient wearing?: Pants, Incontinence brief     Lower body assist Assist for lower body dressing: Maximal Assistance - Patient 25 - 49%     Toileting Toileting Toileting Activity did not occur (Clothing management and hygiene only): Safety/medical concerns(she reported not needing to void this session and reported some incontinence)  Toileting assist Assist for toileting: Maximal Assistance - Patient 25 - 49%     Transfers Chair/bed transfer  Transfers assist  Chair/bed transfer activity did not occur: Safety/medical concerns  Chair/bed transfer assist level: Moderate Assistance - Patient 50 - 74%     Locomotion Ambulation   Ambulation assist   Ambulation activity did not occur: Safety/medical concerns(dizziness/BP )  Assist level: Moderate Assistance - Patient  50 - 74% Assistive device: Walker-rolling Max distance: 10 ft   Walk 10 feet activity   Assist  Walk 10 feet activity did not occur: Safety/medical concerns(dizziness/BP )  Assist level: Moderate Assistance - Patient - 50 -  74% Assistive device: Walker-rolling   Walk 50 feet activity   Assist Walk 50 feet with 2 turns activity did not occur: Safety/medical concerns(dizziness/BP )         Walk 150 feet activity   Assist Walk 150 feet activity did not occur: Safety/medical concerns(dizziness/BP )         Walk 10 feet on uneven surface  activity   Assist Walk 10 feet on uneven surfaces activity did not occur: Safety/medical concerns(dizziness/BP)         Wheelchair     Assist Will patient use wheelchair at discharge?: (TBD ) Type of Wheelchair: (TBD) Wheelchair activity did not occur: Safety/medical concerns(dizziness/BP)  Wheelchair assist level: Minimal Assistance - Patient > 75% Max wheelchair distance: 45'    Wheelchair 50 feet with 2 turns activity    Assist    Wheelchair 50 feet with 2 turns activity did not occur: Safety/medical concerns(dizziness/BP)   Assist Level: Minimal Assistance - Patient > 75%   Wheelchair 150 feet activity     Assist Wheelchair 150 feet activity did not occur: Safety/medical concerns(dizziness/BP)          Medical Problem List and Plan: 1.Decreased functional mobility with dysarthria/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 DVT Prophylaxis/Anticoagulation: subcutaneous Lovenox. Monitor for any bleeding episodes 3. Pain Management:Tylenol as needed 4. Mood:Ativan 1 mg 3 times a day as needed, changed to Klonopin 0.25 3 times daily on 11/14 5. Neuropsych: This patientiscapable of making decisions on herown behalf. 6. Skin/Wound Care:routine skin checks 7. Fluids/Electrolytes/Nutrition:Encourage PO intake.  8.Dysphagia.   D2 thins, advance diet as tolerated 9. Hypertension.Tenormin 25 mg daily. Monitor with increased mobility Vitals:    12/29/17 2011 12/30/17 0512  BP: (!) 145/57 (!) 151/63  Pulse: 68 69  Resp: 17 18  Temp: 98.4 F (36.9 C) (!) 97.5 F (36.4 C)  SpO2: 95% 94%   per neuro desired range 130-160 systolic, changed parameter for clonidine   Added TED and abd binder  Within desired range on 11/15 10. Hypothyroidism. Synthroid 11. Hyperlipidemia. Zetia 12.Diet-controlled diabetes mellitus. Hemoglobin A1c 6.5. CBGs discontinued 13.  HypoK- no diuretic, likely nutritional   Added supplement   Potassium 4.0 on 11/15  Labs ordered for Monday 14.  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  Nausea likely due to CVA added Zofran prn  16.  Hx migraine, worsened since CVA used tenormin at home   Topamax increased to  50mg  bid, prn tramadol added as well 17.  Hyponatremia  Sodium 136 on 11/15  Labs ordered for Monday 18.  Renal artery stenosis  Ultrasound on 11/14 ingesting 1 to 59% bilateral stenosis  Await further vascular Rex  LOS: 10 days A FACE TO FACE EVALUATION WAS PERFORMED  Ankit Karis Juba 12/30/2017, 11:46 AM

## 2017-12-30 NOTE — Progress Notes (Signed)
Occupational Therapy Session Note  Patient Details  Name: Brandy Flowers MRN: [id number] Date of Birth: 1940/05/13  Today's Date: 12/30/2017 OT Individual Time: 1100-1200 OT Individual Time Calculation (min): 60 min    Short Term Goals: Week 1:  OT Short Term Goal 1 (Week 1): Pt will be able to tolerate standing with LRAD for 2 min at a time to engage in LB self care.  OT Short Term Goal 1 - Progress (Week 1): Progressing toward goal OT Short Term Goal 2 (Week 1): Pt will wash UB with set up. OT Short Term Goal 2 - Progress (Week 1): Met OT Short Term Goal 3 (Week 1): Pt will wash LB with min A. OT Short Term Goal 3 - Progress (Week 1): Progressing toward goal OT Short Term Goal 4 (Week 1): Pt will transfer to toilet with min A.  OT Short Term Goal 4 - Progress (Week 1): Progressing toward goal OT Short Term Goal 5 (Week 1): Pt will tolerate being upright for at least 30 min to enable her to be able to shower.  OT Short Term Goal 5 - Progress (Week 1): Progressing toward goal  Skilled Therapeutic Interventions/Progress Updates:    1;1. Pt received inbed reporting 5/10HA improved from 10/10 earlier. Pt willing to participate in tx with encouragemetn. Focus of session on deep breathing for anxiety management, LB dressing and transfers. Pt able to thread BLE into pants and don socks with foot elevated on trash can. Pt sit to stand with MOD A for lifting and VC for hand placement. Pt able to advance pants past hips with MIN A for standing balnace and transfer into w/c with RW use. Pt requires significantly increased time to dress d/t anxiety reaching forward. Pt grooms at sink in sitting. With teds and abdominal binder on seated in w/c pt BP 136/57. OT instructs in use of reacher to doff socks. Pt begins hyperventilating with trialing new task d/t low frustration tolerance. OT continued to educate on importance of practicing ADLs in prep for home to decrease caregiver burden. Pt rolls eyes. Exited  session with pt seated in w/c, call light in reach and all needs met  Therapy Documentation Precautions:  Precautions Precautions: Fall, Other (comment) Precaution Comments: watch BP, dizziness  Restrictions Weight Bearing Restrictions: No General:   Vital Signs:  Pain: Pain Assessment Pain Scale: 0-10 Pain Score: 7  Faces Pain Scale: Hurts even more Pain Type: Acute pain;Neuropathic pain Pain Location: Leg Pain Orientation: Right Pain Descriptors / Indicators: Aching Pain Frequency: Intermittent Pain Onset: Gradual Patients Stated Pain Goal: 0 Pain Intervention(s): Medication (See eMAR);Repositioned;Elevated extremity;Emotional support ADL: ADL Grooming: Minimal cueing, Minimal assistance Where Assessed-Grooming: Sitting at sink Upper Body Bathing: Supervision/safety Where Assessed-Upper Body Bathing: Edge of bed Lower Body Bathing: Moderate assistance Where Assessed-Lower Body Bathing: Edge of bed Upper Body Dressing: Minimal assistance Where Assessed-Upper Body Dressing: Edge of bed Lower Body Dressing: Maximal assistance Where Assessed-Lower Body Dressing: Edge of bed Toileting: Maximal assistance Where Assessed-Toileting: Bedside Commode Toilet Transfer: Moderate assistance Toilet Transfer Method: Stand pivot Science writer: Radiographer, therapeutic: Not assessed Social research officer, government: Not assessed Vision   Perception    Praxis   Exercises:   Other Treatments:     Therapy/Group: Individual Therapy  Tonny Branch 12/30/2017, 11:35 AM

## 2017-12-30 NOTE — Progress Notes (Signed)
Speech Language Pathology Daily Session Note  Patient Details  Name: Eugene GarnetBarbara Yonke MRN: [id number] Date of Birth: 01/14/1941  Today's Date: 12/30/2017 SLP Individual Time: [id number] SLP Individual Time Calculation (min): 45 min  Short Term Goals: Week 2: SLP Short Term Goal 1 (Week 2): Patient will utilize speech intelligibility strategies at the conversation level with Supervision verbal cues to achieve ~90% intelligibility.  SLP Short Term Goal 2 (Week 2): Patient will consume current diet with minimal overt s/s of aspiration with Min A verbal cues for use of swallowing compensatory strategies.  SLP Short Term Goal 3 (Week 2): Patient will demonstrate selective attention to functional tasks in a mildly distracting enviornment for ~25 minutes with Min A verbal cues for redirection.  SLP Short Term Goal 4 (Week 2): Patient will recall new, daily information with Min A verbal cues.  SLP Short Term Goal 5 (Week 2): Patient will demonstrate functional problem solving for mildly complex tasks with supervision verbal cues.   Skilled Therapeutic Interventions:  Skilled treatment session focused on cognition goals. SLP received pt in bed and was NPO d/t renal ultra-sound. Pt able to accurately describe ultra-sound and need for ultra-sound. Pt effectively directed personal care and SLP facilitated cleaning from BM. Pt able to problem solve and direct basic problem solving related to task. Pt also able to recall compensatory swallow strategies that she would need to use for lunch consumption (with Min A to supervision cues). Pt left cleaned, in bed, bed alarm on and all needs within reach. Continue per current plan of care.      Pain Pain Assessment Pain Scale: 0-10 Pain Score: 0-No pain  Therapy/Group: Individual Therapy  Javen Ridings 12/30/2017, 8:25 AM

## 2017-12-30 NOTE — Progress Notes (Signed)
Physical Therapy Session Note  Patient Details  Name: Brandy Flowers MRN: [id number] Date of Birth: 12/19/1940  Today's Date: 12/30/2017 PT Individual Time: 1300-1355 AND 1535-1605 PT Individual Time Calculation (min): 55 min AND 30 min   Short Term Goals: Week 2:  PT Short Term Goal 1 (Week 2): Pt will ambulate 25' w/ LRAD, min assist PT Short Term Goal 2 (Week 2): Pt will tolerate 30 min of OOB activity w/o increase in fatigue PT Short Term Goal 3 (Week 2): Pt will perform bed mobility w/ min assist PT Short Term Goal 4 (Week 2): Pt will maintain dynamic standing balance w/ min assist  Skilled Therapeutic Interventions/Progress Updates:   Session 1:  Pt in supine and agreeable to therapy, denies pain. Pt noticeably more alert and calm this session compared to previous sessions w/ this therapist. Pt w/ increased hyperventilation and anxiety w/ all OOB mobility in past, no hyperventilation or anxiety observed today. Pt transferred to EOB and to w/c w/ min assist and much more quickly than in previous sessions. Total assist w/c transport to/from therapy gym for time management. Worked on OOB tolerance and global strengthening this session. Ambulated 10', 20' and 50' w/ min assist using RW and increased time 2/2 slow gait speed. Verbal and tactile cues for upright posture throughout. Worked on standing tolerance while standing w/ RW support to perform card matching task in multiple 2-3 min bouts. Min assist for balance w/ verbal cues for upright posture to improved balance. Performed NuStep 5 min @ level 1 for LE strengthening and endurance training. Pt refused to go further than 5 min 2/2 fatigue. Returned to room via w/c, ended session in supine and all needs in reach. Missed 20 min of skilled PT 2/2 refusal/fatigue.   Session 2:  Pt in supine and agreeable to therapy, denies pain. Session focused on bed level exercises w/ emphasis on functional LE strengthening. Performed supine bridges 3x10,  heel slides 2x10, adduction squeezes 3x10, resisted abduction 3x10, and LE extension PNF D1 w/ resistance 2x10. Pt very tearful at end of session, expressed concern about why all of this happened to her. Provided skilled education regarding stroke risk factors and stroke prevention. Provided therapeutic listening and continued encouragement to participate in therapy as well as she was able to today. Ended session in supine, all needs in reach.   Therapy Documentation Precautions:  Precautions Precautions: Fall, Other (comment) Precaution Comments: watch BP, dizziness  Restrictions Weight Bearing Restrictions: No   Therapy/Group: Individual Therapy  Danaka Llera K Otie Headlee 12/30/2017, 4:11 PM

## 2017-12-31 ENCOUNTER — Inpatient Hospital Stay (HOSPITAL_COMMUNITY): Payer: Medicare Other | Admitting: Physical Therapy

## 2017-12-31 MED ORDER — LOSARTAN POTASSIUM 25 MG PO TABS
12.5000 mg | ORAL_TABLET | Freq: Every day | ORAL | Status: DC
  Administered 2018-01-01 – 2018-01-10 (×10): 12.5 mg via ORAL
  Filled 2017-12-31 (×10): qty 0.5

## 2017-12-31 MED ORDER — CLONAZEPAM 0.25 MG PO TBDP
0.2500 mg | ORAL_TABLET | Freq: Two times a day (BID) | ORAL | Status: DC
  Administered 2017-12-31 – 2018-01-04 (×8): 0.25 mg via ORAL
  Filled 2017-12-31 (×8): qty 1

## 2017-12-31 NOTE — Progress Notes (Signed)
Physical Therapy Session Note  Patient Details  Name: Brandy Flowers MRN: [id number] Date of Birth: [birth year]  Today's Date: 12/31/2017 PT Individual Time: 0800-0850 PT Individual Time Calculation (min): 50 min  Short Term Goals: Week 2:  PT Short Term Goal 1 (Week 2): Pt will ambulate 25' w/ LRAD, min assist PT Short Term Goal 2 (Week 2): Pt will tolerate 30 min of OOB activity w/o increase in fatigue PT Short Term Goal 3 (Week 2): Pt will perform bed mobility w/ min assist PT Short Term Goal 4 (Week 2): Pt will maintain dynamic standing balance w/ min assist  Skilled Therapeutic Interventions/Progress Updates:   Pt in supine and very anxious appearing, pt tearful and hyperventilating. RN present to provide anxiety medication. Provided encouragement and therapeutic listening about her concerns until RN able to provide medication. Pt w/ ongoing anxiety about her CLOF and states "I will never get better". Pt remained tearful and anxious throughout session, though she was agreeable to participate in therapy. At one point pt asked therapist if [place] had a policy about assisted-suicide. Told her I was unsure and continued to provide encouragement regarding her functional improvements over last week. Made charge RN, Deidre Ala, aware of conversation. Pt performed all OOB mobility w/ min assist including bed mobility and bed<>chair transfers. BP 148/59 prior to sitting up, donned abdominal binder and TED hose at EOB. Provided min-mod assist to don shirt and pants w/ max encouragement to do as much as she could by herself. Total assist w/c transport to/from therapy gym. Attempted to perform NuStep and let pt set how long she would like to go. Pt initially stated 5 minutes but was unable to go past 1 min 2/2 crying and anxiety. Returned to room and ended session in w/c and in care of NT, all needs met.   Therapy Documentation Precautions:  Precautions Precautions: Fall, Other  (comment) Precaution Comments: watch BP, dizziness  Restrictions Weight Bearing Restrictions: No  Therapy/Group: Individual Therapy  Yeva Bissette Clent Demark 12/31/2017, 12:26 PM

## 2017-12-31 NOTE — Progress Notes (Signed)
Physical Therapy Session Note  Patient Details  Name: Brandy Flowers MRN: [id number] Date of Birth: 16-Sep-1940  Today's Date: 12/31/2017 PT Individual Time: 1130-1157 PT Individual Time Calculation (min): 27 min   Short Term Goals: Week 1:  PT Short Term Goal 1 (Week 1): Patient to tolerate sitting at EOB for at least 10 minutes to allow for improved participation in activities  PT Short Term Goal 1 - Progress (Week 1): Progressing toward goal PT Short Term Goal 2 (Week 1): Patient to transfer to/from [facility] with MinA  PT Short Term Goal 2 - Progress (Week 1): Met PT Short Term Goal 3 (Week 1): Patient to be able to ambulate at least 31f with LRAD, MinA  PT Short Term Goal 3 - Progress (Week 1): Partly met  Skilled Therapeutic Interventions/Progress Updates:  Pt was seen bedside in the am. Pt transferred supine to edge of bed with c/g and verbal cues with head of bed elevated and side rails. Pt performed multiple sit to stand and stand pivot transfers with min A and rolling walker. Pt ambulated 10 feet x 2 with rolling walker and min A. Pt performed stand pivot with no assistive device and mod A with verbal cues. Pt left sitting up in w/c with chair alarm on and call bell within reach.   Therapy Documentation Precautions:  Precautions Precautions: Fall, Other (comment) Precaution Comments: watch BP, dizziness  Restrictions Weight Bearing Restrictions: No General:   Pain: No c/o pain.    Therapy/Group: Individual Therapy  MDub Amis11/16/2019, 12:15 PM

## 2017-12-31 NOTE — Progress Notes (Signed)
[place] PHYSICAL MEDICINE & REHABILITATION PROGRESS NOTE   Subjective/Complaints: Patient seen this morning.  She states she slept well overnight.  She states that the Klonopin causes sedation and requests decrease to twice daily dosing.  She states she had a really good day and therapies yesterday.  ROS: Denies CP, SOB, N/V/D  Objective:   No results found. No results for input(s): WBC, HGB, HCT, PLT in the last 72 hours. Recent Labs    12/30/17 0459  NA 136  K 4.0  CL 107  CO2 20*  GLUCOSE 150*  BUN 18  CREATININE 0.97  CALCIUM 9.3    Intake/Output Summary (Last 24 hours) at 12/31/2017 1623 Last data filed at 12/31/2017 1210 Gross per 24 hour  Intake 610 ml  Output -  Net 610 ml     Physical Exam: Vital Signs Blood pressure 111/79, pulse 64, temperature (!) 97.4 F (36.3 C), temperature source Oral, resp. rate 17, height 5\' 2"  (1.575 m), weight 58.2 kg, SpO2 100 %. Constitutional: No distress . Vital signs reviewed. HENT: Normocephalic.  Atraumatic. Eyes: EOMI. No discharge. Cardiovascular: RRR.  No JVD. Respiratory: CTA bilaterally.  Normal effort. GI: BS +. Non-distended. Musc: No edema or tenderness in extremities. Neurologic: Alert Motor: 4/5 in bilateral deltoid, bicep, tricep, grip Right lower extremity: hip flexor 4--4/5, knee extensors 4/5, ankle dorsiflexor 4+/5 Left lower extremity: hip flexor 4/5, knee extensors 4-4+/5, ankle dorsiflexor 4+/5 Skin: Warm and dry.  Intact.  Assessment/Plan: 1. 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  Physiatrist is providing close team supervision and 24 hour management of active medical problems listed below.  Physiatrist and rehab team continue to assess barriers to discharge/monitor patient progress toward functional and medical goals  Care Tool:  Bathing  Bathing activity did not  occur: Refused Body parts bathed by patient: Right arm, Left arm, Right upper leg, Left upper leg, Face, Chest, Abdomen, Front perineal area   Body parts bathed by helper: Buttocks, Right lower leg, Left lower leg Body parts n/a: Chest, Abdomen   Bathing assist Assist Level: Moderate Assistance - Patient 50 - 74%     Upper Body Dressing/Undressing Upper body dressing Upper body dressing/undressing activity did not occur (including orthotics): Refused What is the patient wearing?: Pull over shirt    Upper body assist Assist Level: Minimal Assistance - Patient > 75%    Lower Body Dressing/Undressing Lower body dressing    Lower body dressing activity did not occur: Refused What is the patient wearing?: Pants, Incontinence brief     Lower body assist Assist for lower body dressing: Maximal Assistance - Patient 25 - 49%     Toileting Toileting Toileting Activity did not occur (Clothing management and hygiene only): Safety/medical concerns(she reported not needing to void this session and reported some incontinence)  Toileting assist Assist for toileting: Maximal Assistance - Patient 25 - 49%     Transfers Chair/bed transfer  Transfers assist  Chair/bed transfer activity did not occur: Safety/medical concerns  Chair/bed transfer assist level: Minimal Assistance - Patient > 75%     Locomotion Ambulation   Ambulation assist   Ambulation activity did not occur: Safety/medical concerns(dizziness/BP )  Assist level: Minimal Assistance - Patient > 75% Assistive device: Walker-rolling Max distance: 10   Walk 10 feet activity   Assist  Walk 10 feet activity did not occur: Safety/medical concerns(dizziness/BP )  Assist level: Minimal Assistance - Patient >  75% Assistive device: Walker-rolling   Walk 50 feet activity   Assist Walk 50 feet with 2 turns activity did not occur: Safety/medical concerns(dizziness/BP )  Assist level: Minimal Assistance - Patient > 75%       Walk 150 feet activity   Assist Walk 150 feet activity did not occur: Safety/medical concerns(dizziness/BP )         Walk 10 feet on uneven surface  activity   Assist Walk 10 feet on uneven surfaces activity did not occur: Safety/medical concerns(dizziness/BP)         Wheelchair     Assist Will patient use wheelchair at discharge?: (TBD ) Type of Wheelchair: (TBD) Wheelchair activity did not occur: Safety/medical concerns(dizziness/BP)  Wheelchair assist level: Minimal Assistance - Patient > 75% Max wheelchair distance: 7750'    Wheelchair 50 feet with 2 turns activity    Assist    Wheelchair 50 feet with 2 turns activity did not occur: Safety/medical concerns(dizziness/BP)   Assist Level: Minimal Assistance - Patient > 75%   Wheelchair 150 feet activity     Assist Wheelchair 150 feet activity did not occur: Safety/medical concerns(dizziness/BP)          Medical Problem List and Plan: 1.Decreased functional mobility with dysarthria/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 DVT Prophylaxis/Anticoagulation: subcutaneous Lovenox. Monitor for any bleeding episodes 3. Pain Management:Tylenol as needed 4. Mood:Ativan 1 mg 3 times a day as needed, changed to Klonopin 0.25 3 times daily on 11/14, decreased to twice daily on 11/16 per patient preference 5. Neuropsych: This patientiscapable of making decisions on herown behalf. 6. Skin/Wound Care:routine skin checks 7. Fluids/Electrolytes/Nutrition:Encourage PO intake.  8.Dysphagia.   D2 thins, advance diet as tolerated 9. Hypertension.Tenormin 25 mg daily. Monitor with increased mobility Vitals:   12/31/17 0418 12/31/17 1244  BP: 117/64 111/79  Pulse: 96 64  Resp:  17  Temp:  (!) 97.4 F (36.3 C)   SpO2:  100%   Cozaar decreased to 12.5 and 11/17  per neuro desired range 130-160 systolic, changed parameter for clonidine   Added TED and abd binder  Within desired range on 11/16 10. Hypothyroidism. Synthroid 11. Hyperlipidemia. Zetia 12.Diet-controlled diabetes mellitus. Hemoglobin A1c 6.5. CBGs discontinued 13.  HypoK- no diuretic, likely nutritional   Added supplement   Potassium 4.0 on 11/15  Labs ordered for Monday 14.  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  Nausea likely due to CVA added Zofran prn  16.  Hx migraine, worsened since CVA used tenormin at home   Topamax increased to  50mg  bid, prn tramadol added as well 17.  Hyponatremia  Sodium 136 on 11/15  Labs ordered for Monday 18.  Renal artery stenosis  Ultrasound on 11/14 ingesting 1 to 59% bilateral stenosis  Continue current plan, follow-up with vascular in 6 months.  LOS: 11 days A FACE TO FACE EVALUATION WAS PERFORMED  Ankit Karis Jubanil Patel 12/31/2017, 4:23 PM

## 2017-12-31 NOTE — Progress Notes (Signed)
[facility]  Subjective  - feels ok   Objective 117/64 96 98.2 F (36.8 C) (Oral) 19 99%  Intake/Output Summary (Last 24 hours) at 12/31/2017 0859 Last data filed at 12/30/2017 2300 Gross per 24 hour  Intake 510 ml  Output -  Net 510 ml   Duplex < 60% renal stenosis bilaterally  Assessment/Planning: Renal artery stenosis but unchanged and still not significant enough to warrant intervention She has follow up with us in 6 months Call if questions  Brandy BrunsCharles Flowers 12/31/2017 8:59 AM --  Laboratory Lab Results: No results for input(s): WBC, HGB, HCT, PLT in the last 72 hours. BMET Recent Labs    12/30/17 0459  NA 136  K 4.0  CL 107  CO2 20*  GLUCOSE 150*  BUN 18  CREATININE 0.97  CALCIUM 9.3    COAG Lab Results  Component Value Date   INR 1.09 12/17/2017   No results found for: PTT

## 2018-01-01 ENCOUNTER — Inpatient Hospital Stay (HOSPITAL_COMMUNITY): Payer: Medicare Other

## 2018-01-01 NOTE — Progress Notes (Signed)
Continues to talk about not wanting to live in this condition. Intermittent periods of crying. Distraction, encouragement, and support provided. Incontinent of B&B, requiring total assist. Will continue to monitor. Brandy MartinezMurray, Tamieka Rancourt A

## 2018-01-01 NOTE — Progress Notes (Signed)
Occupational Therapy Session Note  Patient Details  Name: Brandy Flowers MRN: [id number] Date of Birth: 08/18/1940  Today's Date: 01/01/2018 OT Individual Time: 3646-8032 OT Individual Time Calculation (min): 42 min    Short Term Goals: Week 1:  OT Short Term Goal 1 (Week 1): Pt will be able to tolerate standing with LRAD for 2 min at a time to engage in LB self care.  OT Short Term Goal 1 - Progress (Week 1): Progressing toward goal OT Short Term Goal 2 (Week 1): Pt will wash UB with set up. OT Short Term Goal 2 - Progress (Week 1): Met OT Short Term Goal 3 (Week 1): Pt will wash LB with min A. OT Short Term Goal 3 - Progress (Week 1): Progressing toward goal OT Short Term Goal 4 (Week 1): Pt will transfer to toilet with min A.  OT Short Term Goal 4 - Progress (Week 1): Progressing toward goal OT Short Term Goal 5 (Week 1): Pt will tolerate being upright for at least 30 min to enable her to be able to shower.  OT Short Term Goal 5 - Progress (Week 1): Progressing toward goal  Skilled Therapeutic Interventions/Progress Updates:    1:1.Pt received seated in bed. Pt reporting no pain but usual dizziness. Supine>sitting EOB with VC for sequencing. OT dons thigh high teds. Pt completes donning socks with intermittant CGA for sitting balance. Pt completes UB washing with set up and A to twist open bottle of soap. Pt washes peri area and buttocks with CGA for standing balance. Pt completes oral care with set up at sink. Pt washes hair with shower cap and combs hair. Exited session with pt seated in bed with call light in reach and all needs met  Therapy Documentation Precautions:  Precautions Precautions: Fall, Other (comment) Precaution Comments: watch BP, dizziness  Restrictions Weight Bearing Restrictions: No General:   Vital Signs:  Pain: Pain Assessment Pain Scale: 0-10 Pain Score: 2  ADL: ADL Grooming: Minimal cueing, Minimal assistance Where Assessed-Grooming: Sitting at  sink Upper Body Bathing: Supervision/safety Where Assessed-Upper Body Bathing: Edge of bed Lower Body Bathing: Moderate assistance Where Assessed-Lower Body Bathing: Edge of bed Upper Body Dressing: Minimal assistance Where Assessed-Upper Body Dressing: Edge of bed Lower Body Dressing: Maximal assistance Where Assessed-Lower Body Dressing: Edge of bed Toileting: Maximal assistance Where Assessed-Toileting: Bedside Commode Toilet Transfer: Moderate assistance Toilet Transfer Method: Stand pivot Science writer: Radiographer, therapeutic: Not assessed Social research officer, government: Not assessed Vision   Perception    Praxis   Exercises:   Other Treatments:     Therapy/Group: Individual Therapy  Tonny Branch 01/01/2018, 10:48 AM

## 2018-01-01 NOTE — Progress Notes (Signed)
[place] PHYSICAL MEDICINE & REHABILITATION PROGRESS NOTE   Subjective/Complaints: Patient seen laying in bed this morning.  She states she slept well overnight.  She notes she feels better this morning.  She is much calmer today.  She requests to have her IV DC'd.  ROS: Denies CP, SOB, N/V/D  Objective:   No results found. No results for input(s): WBC, HGB, HCT, PLT in the last 72 hours. Recent Labs    12/30/17 0459  NA 136  K 4.0  CL 107  CO2 20*  GLUCOSE 150*  BUN 18  CREATININE 0.97  CALCIUM 9.3    Intake/Output Summary (Last 24 hours) at 01/01/2018 0802 Last data filed at 12/31/2017 2300 Gross per 24 hour  Intake 610 ml  Output -  Net 610 ml     Physical Exam: Vital Signs Blood pressure (!) 151/54, pulse 67, temperature 98.7 F (37.1 C), temperature source Oral, resp. rate 18, height 5\' 2"  (1.575 m), weight 58.2 kg, SpO2 96 %. Constitutional: No distress . Vital signs reviewed. HENT: Normocephalic.  Atraumatic. Eyes: EOMI. No discharge. Cardiovascular: RRR.  No JVD. Respiratory: CTA bilaterally.  Normal effort. GI: BS +. Non-distended. Musc: No edema or tenderness in extremities. Neurologic: Alert Motor: 4/5 in bilateral deltoid, bicep, tricep, grip Right lower extremity: hip flexor 4--4/5, knee extensors 4/5, ankle dorsiflexor 4+/5, stable Left lower extremity: hip flexor 4/5, knee extensors 4-4+/5, ankle dorsiflexor 4+/5, stable Skin: Warm and dry.  Intact.  Assessment/Plan: 1. 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  Physiatrist is providing close team supervision and 24 hour management of active medical problems listed below.  Physiatrist and rehab team continue to assess barriers to discharge/monitor patient progress toward functional and medical goals  Care Tool:  Bathing  Bathing activity did not occur: Refused Body  parts bathed by patient: Right arm, Left arm, Right upper leg, Left upper leg, Face, Chest, Abdomen, Front perineal area   Body parts bathed by helper: Buttocks, Right lower leg, Left lower leg Body parts n/a: Chest, Abdomen   Bathing assist Assist Level: Moderate Assistance - Patient 50 - 74%     Upper Body Dressing/Undressing Upper body dressing Upper body dressing/undressing activity did not occur (including orthotics): Refused What is the patient wearing?: Pull over shirt    Upper body assist Assist Level: Minimal Assistance - Patient > 75%    Lower Body Dressing/Undressing Lower body dressing    Lower body dressing activity did not occur: Refused What is the patient wearing?: Pants, Incontinence brief     Lower body assist Assist for lower body dressing: Maximal Assistance - Patient 25 - 49%     Toileting Toileting Toileting Activity did not occur (Clothing management and hygiene only): Safety/medical concerns(she reported not needing to void this session and reported some incontinence)  Toileting assist Assist for toileting: Maximal Assistance - Patient 25 - 49%     Transfers Chair/bed transfer  Transfers assist  Chair/bed transfer activity did not occur: Safety/medical concerns  Chair/bed transfer assist level: Minimal Assistance - Patient > 75%     Locomotion Ambulation   Ambulation assist   Ambulation activity did not occur: Safety/medical concerns(dizziness/BP )  Assist level: Minimal Assistance - Patient > 75% Assistive device: Walker-rolling Max distance: 10   Walk 10 feet activity   Assist  Walk 10 feet activity did not occur: Safety/medical concerns(dizziness/BP )  Assist level: Minimal Assistance - Patient >  75% Assistive device: Walker-rolling   Walk 50 feet activity   Assist Walk 50 feet with 2 turns activity did not occur: Safety/medical concerns(dizziness/BP )  Assist level: Minimal Assistance - Patient > 75%      Walk 150 feet  activity   Assist Walk 150 feet activity did not occur: Safety/medical concerns(dizziness/BP )         Walk 10 feet on uneven surface  activity   Assist Walk 10 feet on uneven surfaces activity did not occur: Safety/medical concerns(dizziness/BP)         Wheelchair     Assist Will patient use wheelchair at discharge?: (TBD ) Type of Wheelchair: (TBD) Wheelchair activity did not occur: Safety/medical concerns(dizziness/BP)  Wheelchair assist level: Minimal Assistance - Patient > 75% Max wheelchair distance: 2850'    Wheelchair 50 feet with 2 turns activity    Assist    Wheelchair 50 feet with 2 turns activity did not occur: Safety/medical concerns(dizziness/BP)   Assist Level: Minimal Assistance - Patient > 75%   Wheelchair 150 feet activity     Assist Wheelchair 150 feet activity did not occur: Safety/medical concerns(dizziness/BP)          Medical Problem List and Plan: 1.Decreased functional mobility with dysarthria/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 DVT Prophylaxis/Anticoagulation: subcutaneous Lovenox. Monitor for any bleeding episodes 3. Pain Management:Tylenol as needed 4. Mood:Ativan 1 mg 3 times a day as needed, changed to Klonopin 0.25 3 times daily on 11/14, decreased to twice daily on 11/16 per patient preference 5. Neuropsych: This patientiscapable of making decisions on herown behalf. 6. Skin/Wound Care:routine skin checks 7. Fluids/Electrolytes/Nutrition:Encourage PO intake.  8.Dysphagia.   D2 thins, advance diet as tolerated 9. Hypertension.Tenormin 25 mg daily. Monitor with increased mobility Vitals:   12/31/17 2020 01/01/18 0534  BP: (!) 128/57 (!) 151/54  Pulse: 73 67  Resp: 18   Temp: 98.5 F (36.9 C) 98.7 F (37.1 C)   SpO2: 98% 96%   Cozaar decreased to 12.5 and 11/17  per neuro desired range 130-160 systolic, changed parameter for clonidine   Added TED and abd binder  Labile on 11/17, monitor for trend 10. Hypothyroidism. Synthroid 11. Hyperlipidemia. Zetia 12.Diet-controlled diabetes mellitus. Hemoglobin A1c 6.5. CBGs discontinued  Last week patient and family preferred patient's foot doctor follow-up with patient in the hospital for toenail clippings.  Later discovered with Dr. does not come to the hospital.  Patient and family requesting follow-up, will follow-up tomorrow. 13.  HypoK- no diuretic, likely nutritional   Added supplement   Potassium 4.0 on 11/15  Labs ordered for tomorrow 14.  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  Nausea likely due to CVA added Zofran prn  16.  Hx migraine, worsened since CVA used tenormin at home   Topamax increased to  50mg  bid, prn tramadol added as well 17.  Hyponatremia  Sodium 136 on 11/15  Labs ordered for tomorrow 18.  Renal artery stenosis  Ultrasound on 11/14 ingesting 1 to 59% bilateral stenosis  Continue current plan, follow-up with vascular in 6 months.  LOS: 12 days A FACE TO FACE EVALUATION WAS PERFORMED  Daimon Kean Karis Jubanil Renise Gillies 01/01/2018, 8:02 AM

## 2018-01-01 NOTE — Progress Notes (Signed)
Occupational Therapy Session Note  Patient Details  Name: Brandy GarnetBarbara Flowers MRN: [id number] Date of Birth: 08/19/1940  Today's Date: 01/01/2018 OT Individual Time: [id number] OT Individual Time Calculation (min): 45 min    Skilled Therapeutic Interventions/Progress Updates:    1:1. Pt received in bed asleep. Pt supine>sitting EOB with S. Pt stand pivot transfer with no AD and MIN A to w/c. OT donned teds, non skid socks and abdominal binder. Pt participates in no sew blanket activity (blankets are donated to animal shelter) to improve BUE coordination, improve self efficacy and participation in tx. Pt initially participating well discussion hobby of knitting, however becomes tearful stating, "I dont want to live like this." OT directly asks about thoughts of suicide and if pt has a plan of suicide. Pt states, "I have thought about it, but I have no plan" and "Because I am catholic I cannot take my life, it is a mortal sin." All comments and discussion relayed to RN covering pt and charge RN. Pt being followed by neuropsych. PT returned to room and given preferred glucerna drink. Exited session with pt seated in recliner, call light in reach and belt alarm on.   Therapy Documentation Precautions:  Precautions Precautions: Fall, Other (comment) Precaution Comments: watch BP, dizziness  Restrictions Weight Bearing Restrictions: No General:   Vital Signs: Therapy Vitals Pulse Rate: 66 Resp: 17 BP: (!) 147/66 Patient Position (if appropriate): Lying Oxygen Therapy SpO2: 99 % O2 Device: Room Air   Therapy/Group: Individual Therapy  Shon HaleStephanie M Tomislav Micale 01/01/2018, 5:08 PM

## 2018-01-01 NOTE — Progress Notes (Signed)
After talking to OT, The RN came to sit with the pt. During her meal supervision, to have a conversation with her about those suicidal thoughts. RN asked the pt. Directly what she means when she said :" I can't do this any more" or "I can't live like this" She said, that she does not want to live the way she is right now, but that she will not do anything to heart herself, due to the fact that she is Catholic. She also said, that this is not new,that she has been seeing by a counselor every two weeks and they always discuss this subject.After reassuring that the patient does not has an actual plan to harm herself,RN left the pt. Sitting on her chair with the chair alarm on, and talking on the phone with a family member that call to check on her.Keep monitoring and assessing the pt. Closely.

## 2018-01-02 ENCOUNTER — Inpatient Hospital Stay (HOSPITAL_COMMUNITY): Payer: Self-pay

## 2018-01-02 ENCOUNTER — Inpatient Hospital Stay (HOSPITAL_COMMUNITY): Payer: Medicare Other | Admitting: Occupational Therapy

## 2018-01-02 ENCOUNTER — Telehealth: Payer: Self-pay | Admitting: Vascular Surgery

## 2018-01-02 ENCOUNTER — Inpatient Hospital Stay (HOSPITAL_COMMUNITY): Payer: Medicare Other | Admitting: Speech Pathology

## 2018-01-02 ENCOUNTER — Inpatient Hospital Stay (HOSPITAL_COMMUNITY): Payer: Medicare Other

## 2018-01-02 NOTE — Progress Notes (Signed)
Physical Therapy Session Note  Patient Details  Name: Brandy GarnetBarbara Flowers MRN: [id number] Date of Birth: 01/17/1941  Today's Date: 01/02/2018 PT Individual Time: 1100-1130 PT Individual Time Calculation (min): 30 min   Short Term Goals: Week 2:  PT Short Term Goal 1 (Week 2): Pt will ambulate 25' w/ LRAD, min assist PT Short Term Goal 2 (Week 2): Pt will tolerate 30 min of OOB activity w/o increase in fatigue PT Short Term Goal 3 (Week 2): Pt will perform bed mobility w/ min assist PT Short Term Goal 4 (Week 2): Pt will maintain dynamic standing balance w/ min assist  Skilled Therapeutic Interventions/Progress Updates:    Pt supine in bed upon PT arrival, agreeable to therapy tx and denies pain but rate dizziness 9/10 this session. Pt transferred to edge of bed with min assist and use of bed features. Pt maintains sitting balance with stand by assist while therapist checks BP 135/67. Pt seated EOB performed gaze stabilization exercises with vertical head turns and horizontal head turns. Pt performed 2 x 10 LAQ and seated marches. Pt seated EOB while therapist performs hamstring stretches bilaterally. Pt reports increasing dizziness and requests to lay back down. Pt performed sit<>stand with mod assist to scoot up in bed, transferred to supine min assist and left with needs in reach and bed alarm set. Pt missed 30 minutes of skilled therapy tx this session secondary to dizziness.   Therapy Documentation Precautions:  Precautions Precautions: Fall, Other (comment) Precaution Comments: watch BP, dizziness  Restrictions Weight Bearing Restrictions: No    Therapy/Group: Individual Therapy  Cresenciano GenreEmily van Schagen, PT, DPT 01/02/2018, 7:57 AM

## 2018-01-02 NOTE — Progress Notes (Signed)
Speech Language Pathology Daily Session Note  Patient Details  Name: Eugene GarnetBarbara Schroyer MRN: [id number] Date of Birth: 04/08/1940  Today's Date: 01/02/2018 SLP Individual Time: [id number] SLP Individual Time Calculation (min): 45 min  Short Term Goals: Week 2: SLP Short Term Goal 1 (Week 2): Patient will utilize speech intelligibility strategies at the conversation level with Supervision verbal cues to achieve ~90% intelligibility.  SLP Short Term Goal 2 (Week 2): Patient will consume current diet with minimal overt s/s of aspiration with Min A verbal cues for use of swallowing compensatory strategies.  SLP Short Term Goal 3 (Week 2): Patient will demonstrate selective attention to functional tasks in a mildly distracting enviornment for ~25 minutes with Min A verbal cues for redirection.  SLP Short Term Goal 4 (Week 2): Patient will recall new, daily information with Min A verbal cues.  SLP Short Term Goal 5 (Week 2): Patient will demonstrate functional problem solving for mildly complex tasks with supervision verbal cues.   Skilled Therapeutic Interventions:  Skilled treatment session focused on dysphagia and cognition goals. SLP facilitated session by providing skilled observation of pt consuming dysphagia 2 breakfast with thin liquids. Pt able to recall and use chin tuck with consumption of food and liquids with Mod I. Pt with no overt s/s of aspiration. Pt able to demonstrate selective attention to task for ~ 30 minutes. Pt with heavy breathing when lifting items such as oatmeal or or fork off tray. Heavy breathing related to excursion with task not consumption of POs. Pt left upright in bed, bed alarm on and all needs within reach. Continue per current plan of care.        Pain Pain Assessment Pain Scale: 0-10 Pain Score: 0-No pain  Therapy/Group: Individual Therapy  Jazier Mcglamery 01/02/2018, 8:10 AM

## 2018-01-02 NOTE — Progress Notes (Signed)
Occupational Therapy Session Note  Patient Details  Name: Brandy GarnetBarbara Okelley MRN: [id number] Date of Birth: 12/08/1940  Today's Date: 01/02/2018 OT Individual Time: 1000-1050 OT Individual Time Calculation (min): 50 min    Short Term Goals: Week 2:  OT Short Term Goal 1 (Week 2): Pt will complete BSC transfers with min A of 1 person. OT Short Term Goal 2 (Week 2): Pt will be able to self cleanse post toileting with min A. OT Short Term Goal 3 (Week 2): Pt will don shirt with set up.  OT Short Term Goal 4 (Week 2): Pt will be able to tolerate standing with RW for 2 minutes to engage in LB cleansing.  OT Short Term Goal 5 (Week 2): Pt will tolerate sitting unsupported for at least 30 minutes to allow her to engage in a shower.  Skilled Therapeutic Interventions/Progress Updates:    Treatment session with focus on activity tolerance and OOB activity.  Pt received supine in bed reporting feeling "better" this am.  Pt declined any bathing or dressing.  Agreeable to donning thigh high TEDS with encouragement.  Therapist donned TEDS and abdominal binder for BP management.  Pt reports incontinent of bladder.  Engaged in hygiene at bed level with rolling Rt and Lt at supervision level.  Therapist completed hygiene, noted pt to also have small incontinent stool.  Pt completed sidelying to sitting with min guard.  Stand pivot transfer contact guard assist.  Pt agreeable to attempting table top activity in sitting.  BP monitored once upright and at end of session, see Flowsheets.  Pt completed pattern replication task utilizing BUE for functional use of BUE while focusing on increased upright/OOB tolerance.  Pt quickly reports "I need to go back to bed", reports dizziness and not feeling well.  Returned to room and completed stand pivot transfer mod assist to bed.  Left semi-reclined in chair position in bed with all needs in reach.  Therapy Documentation Precautions:  Precautions Precautions: Fall, Other  (comment) Precaution Comments: watch BP, dizziness  Restrictions Weight Bearing Restrictions: No General: General PT Missed Treatment Reason: Patient unwilling to participate;Other (Comment)(dizziness) Vital Signs: Therapy Vitals BP: (!) 121/46 Patient Position (if appropriate): Sitting Pain: Pain Assessment Pain Scale: 0-10 Pain Score: 0-No pain   Therapy/Group: Individual Therapy  Rosalio LoudHOXIE, Crispin Vogel 01/02/2018, 12:04 PM

## 2018-01-02 NOTE — Telephone Encounter (Signed)
sch appt lvm mld ltr 07/05/17 10am Renal 1045am f/u MD

## 2018-01-02 NOTE — Progress Notes (Signed)
Occupational Therapy Session Note  Patient Details  Name: Brandy GarnetBarbara Flowers MRN: [id number] Date of Birth: 08/01/1940  Today's Date: 01/02/2018 OT Individual Time: 1345-1425 OT Individual Time Calculation (min): 40 min    Short Term Goals: Week 2:  OT Short Term Goal 1 (Week 2): Pt will complete BSC transfers with min A of 1 person. OT Short Term Goal 2 (Week 2): Pt will be able to self cleanse post toileting with min A. OT Short Term Goal 3 (Week 2): Pt will don shirt with set up.  OT Short Term Goal 4 (Week 2): Pt will be able to tolerate standing with RW for 2 minutes to engage in LB cleansing.  OT Short Term Goal 5 (Week 2): Pt will tolerate sitting unsupported for at least 30 minutes to allow her to engage in a shower.  Skilled Therapeutic Interventions/Progress Updates:    OT intervention with focus on bed mobility, sit<>stand, standing balance, sitting balance, BUE therex (see below), and activity tolerance to increase independence with BADLs. Pt denied any pain but continues to c/o dizziness with activity.  Bed mobility and sitting balance at supervision level.  Sit<>stand and standing balance with CGA.  Pt reported increased dizziness with PNF exercises.  Pt reported increased dizziness during later part of session.  Pt completed BUE therex seated EOB with feet supported.  Pt remained in bed with all needs within reach and bed alarm activated.   Therapy Documentation Precautions:  Precautions Precautions: Fall, Other (comment) Precaution Comments: watch BP, dizziness  Restrictions Weight Bearing Restrictions: No  ADL: ADL Grooming: Minimal cueing, Minimal assistance Where Assessed-Grooming: Sitting at sink Upper Body Bathing: Supervision/safety Where Assessed-Upper Body Bathing: Edge of bed Lower Body Bathing: Moderate assistance Where Assessed-Lower Body Bathing: Edge of bed Upper Body Dressing: Minimal assistance Where Assessed-Upper Body Dressing: Edge of bed Lower Body  Dressing: Maximal assistance Where Assessed-Lower Body Dressing: Edge of bed Toileting: Maximal assistance Where Assessed-Toileting: Bedside Commode Toilet Transfer: Moderate assistance Toilet Transfer Method: Stand pivot Toilet Transfer Equipment: GafferBedside commode Tub/Shower Transfer: Not assessed Film/video editorWalk-In Shower Transfer: Not assessed   Exercises: General Exercises - Upper Extremity Shoulder Flexion: Strengthening;Right;Left;10 reps;Seated;Bar weights/barbell Bar Weights/Barbell (Shoulder Flexion): 3 lbs Shoulder Extension: Strengthening;Right;Left;10 reps;Seated;Bar weights/barbell Bar Weights/Barbell (Shoulder Extension): 3 lbs Elbow Flexion: Strengthening;Right;Left;10 reps;Seated;Bar weights/barbell Bar Weights/Barbell (Elbow Flexion): 3 lbs  Therapy/Group: Individual Therapy  Rich BraveLanier, Victorian Gunn Chappell 01/02/2018, 2:49 PM

## 2018-01-02 NOTE — Progress Notes (Signed)
[place] PHYSICAL MEDICINE & REHABILITATION PROGRESS NOTE   Subjective/Complaints:  No issues overnite dizziness is better , tolerating therapy  ROS: Denies CP, SOB, N/V/D  Objective:   No results found. No results for input(s): WBC, HGB, HCT, PLT in the last 72 hours. No results for input(s): NA, K, CL, CO2, GLUCOSE, BUN, CREATININE, CALCIUM in the last 72 hours.  Intake/Output Summary (Last 24 hours) at 01/02/2018 0907 Last data filed at 01/02/2018 0800 Gross per 24 hour  Intake 540 ml  Output -  Net 540 ml     Physical Exam: Vital Signs Blood pressure (!) 153/55, pulse 67, temperature 98 F (36.7 C), resp. rate 18, height 5\' 2"  (1.575 m), weight 58.2 kg, SpO2 98 %. Constitutional: No distress . Vital signs reviewed. HENT: Normocephalic.  Atraumatic. Eyes: EOMI. No discharge. Cardiovascular: RRR.  No JVD. Respiratory: CTA bilaterally.  Normal effort. GI: BS +. Non-distended. Musc: No edema or tenderness in extremities. Neurologic: Alert Motor: 4/5 in bilateral deltoid, bicep, tricep, grip Right lower extremity: hip flexor 4--4/5, knee extensors 4/5, ankle dorsiflexor 4+/5, stable Left lower extremity: hip flexor 4/5, knee extensors 4-4+/5, ankle dorsiflexor 4+/5, stable Skin: Warm and dry.  Intact.  Assessment/Plan: 1. 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  Physiatrist is providing close team supervision and 24 hour management of active medical problems listed below.  Physiatrist and rehab team continue to assess barriers to discharge/monitor patient progress toward functional and medical goals  Care Tool:  Bathing  Bathing activity did not occur: Refused Body parts bathed by patient: Right arm, Left arm, Right upper leg, Left upper leg, Face, Chest, Abdomen, Front perineal area   Body parts bathed by helper: Buttocks, Right lower  leg, Left lower leg Body parts n/a: Chest, Abdomen   Bathing assist Assist Level: Moderate Assistance - Patient 50 - 74%     Upper Body Dressing/Undressing Upper body dressing Upper body dressing/undressing activity did not occur (including orthotics): Refused What is the patient wearing?: Pull over shirt    Upper body assist Assist Level: Minimal Assistance - Patient > 75%    Lower Body Dressing/Undressing Lower body dressing    Lower body dressing activity did not occur: Refused What is the patient wearing?: Pants, Incontinence brief     Lower body assist Assist for lower body dressing: Maximal Assistance - Patient 25 - 49%     Toileting Toileting Toileting Activity did not occur (Clothing management and hygiene only): Safety/medical concerns(she reported not needing to void this session and reported some incontinence)  Toileting assist Assist for toileting: Maximal Assistance - Patient 25 - 49%     Transfers Chair/bed transfer  Transfers assist  Chair/bed transfer activity did not occur: Safety/medical concerns  Chair/bed transfer assist level: Minimal Assistance - Patient > 75%     Locomotion Ambulation   Ambulation assist   Ambulation activity did not occur: Safety/medical concerns(dizziness/BP )  Assist level: Minimal Assistance - Patient > 75% Assistive device: Walker-rolling Max distance: 10   Walk 10 feet activity   Assist  Walk 10 feet activity did not occur: Safety/medical concerns(dizziness/BP )  Assist level: Minimal Assistance - Patient > 75% Assistive device: Walker-rolling   Walk 50 feet activity   Assist Walk 50 feet with 2 turns activity did not occur: Safety/medical concerns(dizziness/BP )  Assist level: Minimal Assistance - Patient > 75%      Walk 150 feet activity  Assist Walk 150 feet activity did not occur: Safety/medical concerns(dizziness/BP )         Walk 10 feet on uneven surface  activity   Assist Walk 10  feet on uneven surfaces activity did not occur: Safety/medical concerns(dizziness/BP)         Wheelchair     Assist Will patient use wheelchair at discharge?: (TBD ) Type of Wheelchair: (TBD) Wheelchair activity did not occur: Safety/medical concerns(dizziness/BP)  Wheelchair assist level: Minimal Assistance - Patient > 75% Max wheelchair distance: 23'    Wheelchair 50 feet with 2 turns activity    Assist    Wheelchair 50 feet with 2 turns activity did not occur: Safety/medical concerns(dizziness/BP)   Assist Level: Minimal Assistance - Patient > 75%   Wheelchair 150 feet activity     Assist Wheelchair 150 feet activity did not occur: Safety/medical concerns(dizziness/BP)          Medical Problem List and Plan: 1.Decreased functional mobility with dysarthria/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 DVT Prophylaxis/Anticoagulation: subcutaneous Lovenox. Monitor for any bleeding episodes 3. Pain Management:Tylenol as needed 4. Mood:Ativan 1 mg 3 times a day as needed, changed to Klonopin 0.25 3 times daily on 11/14, decreased to twice daily on 11/16 per patient preference 5. Neuropsych: This patientiscapable of making decisions on herown behalf. 6. Skin/Wound Care:routine skin checks 7. Fluids/Electrolytes/Nutrition:Encourage PO intake.  8.Dysphagia.   D2 thins, advance diet as tolerated 9. Hypertension.Tenormin 25 mg daily. Monitor with increased mobility Vitals:   01/01/18 1933 01/02/18 0510  BP: 127/61 (!) 153/55  Pulse: 69 67  Resp: 16 18  Temp: 97.9 F (36.6 C) 98 F (36.7 C)  SpO2: 97% 98%   Cozaar decreased to 12.5 and 11/17  per neuro desired range 130-160 systolic, changed parameter for clonidine   In range 11/18 10. Hypothyroidism.  Synthroid 11. Hyperlipidemia. Zetia 12.Diet-controlled diabetes mellitus. Hemoglobin A1c 6.5. CBGs discontinued  Discussed podiatry does not provide inpt services 13.  HypoK- no diuretic, likely nutritional   Added supplement   Potassium 4.0 on 11/15  14.  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  Nausea likely due to CVA added Zofran prn  16.  Hx migraine, worsened since CVA used tenormin at home   Topamax increased to  50mg  bid, prn tramadol added as well 17.  Hyponatremia  Sodium 136 on 11/15  Labs ordered for tomorrow 18.  Renal artery stenosis  Ultrasound on 11/14 ingesting 1 to 59% bilateral stenosis  Continue current plan, follow-up with vascular in 6 months.  LOS: 13 days A FACE TO FACE EVALUATION WAS PERFORMED  Erick Colace 01/02/2018, 9:07 AM

## 2018-01-02 NOTE — Telephone Encounter (Signed)
-----   Message from Sharee PimpleMarilyn K McChesney, RN sent at 01/02/2018  9:16 AM EST ----- Regarding: cx appt and reschedule for 6 months   ----- Message ----- From: Sherren KernsFields, Charles E, MD Sent: 12/31/2017   9:02 AM EST To: [facility] Charge Pool  Pt was seen in hospital and had renal duplex.  You can cancel her Dec appt and put her down for renal duplex and see me in 6 months  Fabienne Brunsharles Fields

## 2018-01-03 ENCOUNTER — Inpatient Hospital Stay (HOSPITAL_COMMUNITY): Payer: Medicare Other | Admitting: Speech Pathology

## 2018-01-03 ENCOUNTER — Inpatient Hospital Stay (HOSPITAL_COMMUNITY): Payer: Medicare Other

## 2018-01-03 ENCOUNTER — Inpatient Hospital Stay (HOSPITAL_COMMUNITY): Payer: Self-pay | Admitting: Physical Therapy

## 2018-01-03 LAB — CREATININE, SERUM
Creatinine, Ser: 0.86 mg/dL (ref 0.44–1.00)
GFR calc Af Amer: >60 mL/min (ref >60)
GFR calc non Af Amer: >60 mL/min (ref >60)

## 2018-01-03 NOTE — Progress Notes (Signed)
[place] PHYSICAL MEDICINE & REHABILITATION PROGRESS NOTE   Subjective/Complaints:  No issues overnite, no dizziness or nausea today, no HA  ROS: Denies CP, SOB, N/V/D  Objective:   No results found. No results for input(s): WBC, HGB, HCT, PLT in the last 72 hours. Recent Labs    01/03/18 0553  CREATININE 0.86    Intake/Output Summary (Last 24 hours) at 01/03/2018 0845 Last data filed at 01/02/2018 1900 Gross per 24 hour  Intake 480 ml  Output -  Net 480 ml     Physical Exam: Vital Signs Blood pressure (!) 154/60, pulse 70, temperature 98 F (36.7 C), temperature source Oral, resp. rate 16, height 5\' 2"  (1.575 m), weight 58.2 kg, SpO2 99 %. Constitutional: No distress . Vital signs reviewed. HENT: Normocephalic.  Atraumatic. Eyes: EOMI. No discharge. Cardiovascular: RRR.  No JVD. Respiratory: CTA bilaterally.  Normal effort. GI: BS +. Non-distended. Musc: No edema or tenderness in extremities. Neurologic: Alert Motor: 4/5 in bilateral deltoid, bicep, tricep, grip Right lower extremity: hip flexor 4--4/5, knee extensors 4/5, ankle dorsiflexor 4+/5, stable Left lower extremity: hip flexor 4/5, knee extensors 4-4+/5, ankle dorsiflexor 4+/5, stable Skin: Warm and dry.  Intact.  Assessment/Plan: 1. 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  Physiatrist is providing close team supervision and 24 hour management of active medical problems listed below.  Physiatrist and rehab team continue to assess barriers to discharge/monitor patient progress toward functional and medical goals  Care Tool:  Bathing  Bathing activity did not occur: Refused Body parts bathed by patient: Right arm, Left arm, Right upper leg, Left upper leg, Face, Chest, Abdomen, Front perineal area   Body parts bathed by helper: Buttocks, Right lower leg, Left lower leg Body  parts n/a: Chest, Abdomen   Bathing assist Assist Level: Moderate Assistance - Patient 50 - 74%     Upper Body Dressing/Undressing Upper body dressing Upper body dressing/undressing activity did not occur (including orthotics): Refused What is the patient wearing?: Pull over shirt    Upper body assist Assist Level: Minimal Assistance - Patient > 75%    Lower Body Dressing/Undressing Lower body dressing    Lower body dressing activity did not occur: Refused What is the patient wearing?: Pants, Incontinence brief     Lower body assist Assist for lower body dressing: Maximal Assistance - Patient 25 - 49%     Toileting Toileting Toileting Activity did not occur (Clothing management and hygiene only): Safety/medical concerns(she reported not needing to void this session and reported some incontinence)  Toileting assist Assist for toileting: Maximal Assistance - Patient 25 - 49%     Transfers Chair/bed transfer  Transfers assist  Chair/bed transfer activity did not occur: Safety/medical concerns  Chair/bed transfer assist level: Minimal Assistance - Patient > 75%     Locomotion Ambulation   Ambulation assist   Ambulation activity did not occur: Safety/medical concerns(dizziness/BP )  Assist level: Minimal Assistance - Patient > 75% Assistive device: Walker-rolling Max distance: 10   Walk 10 feet activity   Assist  Walk 10 feet activity did not occur: Safety/medical concerns(dizziness/BP )  Assist level: Minimal Assistance - Patient > 75% Assistive device: Walker-rolling   Walk 50 feet activity   Assist Walk 50 feet with 2 turns activity did not occur: Safety/medical concerns(dizziness/BP )  Assist level: Minimal Assistance - Patient > 75%      Walk 150 feet activity  Assist Walk 150 feet activity did not occur: Safety/medical concerns(dizziness/BP )         Walk 10 feet on uneven surface  activity   Assist Walk 10 feet on uneven surfaces  activity did not occur: Safety/medical concerns(dizziness/BP)         Wheelchair     Assist Will patient use wheelchair at discharge?: (TBD ) Type of Wheelchair: (TBD) Wheelchair activity did not occur: Safety/medical concerns(dizziness/BP)  Wheelchair assist level: Minimal Assistance - Patient > 75% Max wheelchair distance: 2550'    Wheelchair 50 feet with 2 turns activity    Assist    Wheelchair 50 feet with 2 turns activity did not occur: Safety/medical concerns(dizziness/BP)   Assist Level: Minimal Assistance - Patient > 75%   Wheelchair 150 feet activity     Assist Wheelchair 150 feet activity did not occur: Safety/medical concerns(dizziness/BP)          Medical Problem List and Plan: 1.Decreased functional mobility with dysarthria/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 DVT Prophylaxis/Anticoagulation: subcutaneous Lovenox. Monitor for any bleeding episodes 3. Pain Management:Tylenol as needed 4. Mood:Ativan 1 mg 3 times a day as needed, changed to Klonopin 0.25 3 times daily on 11/14, decreased to twice daily on 11/16 per patient preference 5. Neuropsych: This patientiscapable of making decisions on herown behalf. 6. Skin/Wound Care:routine skin checks 7. Fluids/Electrolytes/Nutrition:Encourage PO intake.  8.Dysphagia.   D2 thins, advance diet as tolerated 9. Hypertension.Tenormin 25 mg daily. Monitor with increased mobility Vitals:   01/02/18 1915 01/03/18 0300  BP: (!) 138/54 (!) 154/60  Pulse: 71 70  Resp: 18 16  Temp: 98 F (36.7 C) 98 F (36.7 C)  SpO2: 98% 99%   Cozaar decreased to 12.5 and 11/17  per neuro desired range 130-160 systolic, changed parameter for clonidine   In range 11/19- lying to sitting without ortho drops but  standing not recorded 10. Hypothyroidism. Synthroid 11. Hyperlipidemia. Zetia 12.Diet-controlled diabetes mellitus. Hemoglobin A1c 6.5. CBGs discontinued  Discussed podiatry does not provide inpt services 13.  HypoK- no diuretic, likely nutritional   Added supplement   Potassium 4.0 on 11/15  14.  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  Nausea likely due to CVA added Zofran prn  16.  Hx migraine, now controlled  used tenormin at home   Topamax increased to  50mg  bid, prn tramadol added as well 17.  Hyponatremia  Sodium 136 on 11/15  Labs ordered for tomorrow 18.  Renal artery stenosis  Ultrasound on 11/14 ingesting 1 to 59% bilateral stenosis  Continue current plan, follow-up with vascular in 6 months.  LOS: 14 days A FACE TO FACE EVALUATION WAS PERFORMED  Erick Colacendrew E  01/03/2018, 8:45 AM

## 2018-01-03 NOTE — Progress Notes (Signed)
Speech Language Pathology Daily Session Note  Patient Details  Name: Brandy GarnetBarbara Hershman MRN: [id number] Date of Birth: 10/18/1940  Today's Date: 01/03/2018 SLP Individual Time: 1005-1100 SLP Individual Time Calculation (min): 55 min  Short Term Goals: Week 2: SLP Short Term Goal 1 (Week 2): Patient will utilize speech intelligibility strategies at the conversation level with Supervision verbal cues to achieve ~90% intelligibility.  SLP Short Term Goal 2 (Week 2): Patient will consume current diet with minimal overt s/s of aspiration with Min A verbal cues for use of swallowing compensatory strategies.  SLP Short Term Goal 3 (Week 2): Patient will demonstrate selective attention to functional tasks in a mildly distracting enviornment for ~25 minutes with Min A verbal cues for redirection.  SLP Short Term Goal 4 (Week 2): Patient will recall new, daily information with Min A verbal cues.  SLP Short Term Goal 5 (Week 2): Patient will demonstrate functional problem solving for mildly complex tasks with supervision verbal cues.   Skilled Therapeutic Interventions:  Pt was seen for skilled ST targeting cognitive goals.  Upon arrival, pt reported that she had been incontinent of stool and was getting ready to contact nursing but had not yet done so.  Therapist provided hygiene and donned a clean brief.  Pt was then transferred to wheelchair for therapy session.  Pt declined to wear her compression stockings but was agreeable to wearing her abdominal binder.  Once sitting upright in wheelchair, pt had complaints of increased dizziness which progressively worsened throughout session.  Vitals were taken: BP 121/54, HR 69, SpO2 98.  RN made aware as systolic pressure was below parameters set by neuro and pt appeared somewhat symptomatic (versus anxiety).  Pt was able to recall function of her currently scheduled medications with overall supervision cues when medications were known from prior to admission but new  medications were less familiar to pt.  Therapist left a written list of medications in pt's room for future medication management tasks which were unable to be addressed today due to time constraints and other limiting medical and/or behavioral factors.  Pt was returned to room and left in bed with bed alarm set and all needs within reach.  Continue per current plan of care.    Pain Pain Assessment Pain Scale: 0-10 Pain Score: 0-No pain  Therapy/Group: Individual Therapy  Dalonte Hardage, Melanee SpryNicole L 01/03/2018, 1:18 PM

## 2018-01-03 NOTE — Progress Notes (Signed)
Physical Therapy Session Note  Patient Details  Name: Brandy GarnetBarbara Gorman MRN: [id number] Date of Birth: 12/29/1940  Today's Date: 01/03/2018 PT Individual Time: 1130-1200 PT Individual Time Calculation (min): 30 min   Short Term Goals: Week 2:  PT Short Term Goal 1 (Week 2): Pt will ambulate 25' w/ LRAD, min assist PT Short Term Goal 2 (Week 2): Pt will tolerate 30 min of OOB activity w/o increase in fatigue PT Short Term Goal 3 (Week 2): Pt will perform bed mobility w/ min assist PT Short Term Goal 4 (Week 2): Pt will maintain dynamic standing balance w/ min assist  Skilled Therapeutic Interventions/Progress Updates:    Pt supine in bed upon PT arrival, agreeable to therapy tx and denies pain. Pt reports dizziness 5/10 upon sitting up this session. Pt transferred to sitting EOB with min assist and verbal cues for techniques. Pt performed stand pivot transfer from bed>w/c with mod assist. Pt transported to dayroom. Pt used kinetron in sitting x 6 minutes working on LE strength and endurance. Pt ambulated x 80 ft from dayroom using RW and min assist working on activity tolerance and endurance. Pt transported back to room and performed min assist stand pivot to recliner. Left seated in recliner with needs in reach and chair alarm set.   Therapy Documentation Precautions:  Precautions Precautions: Fall, Other (comment) Precaution Comments: watch BP, dizziness  Restrictions Weight Bearing Restrictions: No    Therapy/Group: Individual Therapy  Cresenciano GenreEmily van Schagen, PT, DPT 01/03/2018, 7:50 AM

## 2018-01-03 NOTE — Progress Notes (Signed)
Social Work Patient ID: Brandy Flowers, female   DOB: 15-Feb-1941, 77 y.o.   MRN: [id number] Met with pt to discuss discharge plans and the fact her neighbors can not provide care to her, but will be checking on her to see if ok. She plans to hire private duty through an agency. She feels she can afford it for two weeks and then plans to have her son assist with paying fr it, he will be here Thanksgiving to visit her. Discussed the high cost of this and if she is able to afford it, she feels she can. Discussed option to go to a NH for a short time and she refused this. She states: " I want to go home and will be going home." Will work with her on the plans to go home next week 11/26.

## 2018-01-03 NOTE — Progress Notes (Signed)
Speech Language Pathology Daily Make-up Session Note  Patient Details  Name: Brandy GarnetBarbara Flowers MRN: [id number] Date of Birth: 05/26/1940  Today's Date: 01/03/2018 SLP Individual Time: 0730-0800 SLP Individual Time Calculation (min): 30 min  Short Term Goals: Week 2: SLP Short Term Goal 1 (Week 2): Patient will utilize speech intelligibility strategies at the conversation level with Supervision verbal cues to achieve ~90% intelligibility.  SLP Short Term Goal 2 (Week 2): Patient will consume current diet with minimal overt s/s of aspiration with Min A verbal cues for use of swallowing compensatory strategies.  SLP Short Term Goal 3 (Week 2): Patient will demonstrate selective attention to functional tasks in a mildly distracting enviornment for ~25 minutes with Min A verbal cues for redirection.  SLP Short Term Goal 4 (Week 2): Patient will recall new, daily information with Min A verbal cues.  SLP Short Term Goal 5 (Week 2): Patient will demonstrate functional problem solving for mildly complex tasks with supervision verbal cues.   Skilled Therapeutic Interventions: Skilled treatment session focused on dysphagia and cognitive goals. SLP facilitated session by providing set-up assist with breakfast meal of Dys. 2 textures with thin liquids. Patient utilized her swallowing compensatory strategies with overall Mod I. However, increased cueing needed in a minimally distracting environment resulting in overt coughing X 2 when attempting to verbalize with a full oral cavity.  Recommend patient continue current diet. Patient with intermittent crying throughout session but demonstrated selective attention to self-feeding for ~25 minutes with Mod I. Patient left upright in bed with all needs within reach and alarm on. Continue with current plan .      Pain No/Denies Pain   Therapy/Group: Individual Therapy  Sheana Bir 01/03/2018, 2:19 PM

## 2018-01-03 NOTE — Progress Notes (Signed)
Occupational Therapy Session Note  Patient Details  Name: Brandy GarnetBarbara Stuhr MRN: [id number] Date of Birth: 11/11/1940  Today's Date: 01/03/2018 OT Individual Time: 1345-1426 OT Individual Time Calculation (min): 41 min    Short Term Goals: Week 2:  OT Short Term Goal 1 (Week 2): Pt will complete BSC transfers with min A of 1 person. OT Short Term Goal 2 (Week 2): Pt will be able to self cleanse post toileting with min A. OT Short Term Goal 3 (Week 2): Pt will don shirt with set up.  OT Short Term Goal 4 (Week 2): Pt will be able to tolerate standing with RW for 2 minutes to engage in LB cleansing.  OT Short Term Goal 5 (Week 2): Pt will tolerate sitting unsupported for at least 30 minutes to allow her to engage in a shower.  Skilled Therapeutic Interventions/Progress Updates:    Pt resting in bed upon arrival and agreeable to therapy.  Pt recalled AM PT session and 1 of 2 SLP sessions.  OT intervention with focus on bed mobility, sit<>stand, standing balance, BUE therex, activity tolerance, and safety awareness to increase independence with BADLs. Bed mobility at supervision level.  Sit<>stand and standing balance with CGA and min verbal cues for safety awareness.  Pt stood X 4 (3 min each time) and engaged in table tasks with focus on standing balance.  Pt engaged in BUE therex with theraband while seated unsupported EOB. Pt with no c/o dizziness.  Pt returned to supine and remained in bed with all needs within reach and bed alarm activated.   Therapy Documentation Precautions:  Precautions Precautions: Fall, Other (comment) Precaution Comments: watch BP, dizziness  Restrictions Weight Bearing Restrictions: No  Pain: Pt denies pain  Therapy/Group: Individual Therapy  Rich BraveLanier, [name] [name] 01/03/2018, 2:34 PM

## 2018-01-03 NOTE — Progress Notes (Signed)
Physical Therapy Session Note  Patient Details  Name: Brandy GarnetBarbara Flowers MRN: [id number] Date of Birth: 12/28/1940  Today's Date: 01/03/2018 PT Individual Time: 1500-1545 PT Individual Time Calculation (min): 45 min   Short Term Goals: Week 2:  PT Short Term Goal 1 (Week 2): Pt will ambulate 25' w/ LRAD, min assist PT Short Term Goal 2 (Week 2): Pt will tolerate 30 min of OOB activity w/o increase in fatigue PT Short Term Goal 3 (Week 2): Pt will perform bed mobility w/ min assist PT Short Term Goal 4 (Week 2): Pt will maintain dynamic standing balance w/ min assist  Skilled Therapeutic Interventions/Progress Updates:   Pt in supine and agreeable to therapy, denies pain and dizziness. Requesting to change brief to a depends for comfort. Transferred to EOB w/ min assist and provided min assist to doff and don LE garments. Stand pivot transfer to w/c w/ RW, min assist. BP 135/57 w/ abdominal binder donned in seated. Total assist w/c transport to/from therapy gym for time management. Ambulated 100' x2 w/ RW and CGA-min assist w/ increased time. Discussed functional items she needed to complete prior to d/c including stairs and car transfer, neither of which she had performed yet. She was initially very fearful and beginning to hyperventilate, but agreeable to try both and then be done w/ session 2/2 increased anxiety. Negotiated 3 steps w/ R rail, w/ both UEs on R rail for support while side stepping. CGA to close supervision needed and pt very encouraged by this. Increased hyperventilation w/ car transfer 2/2 anxiety as well, however able to perform w/ min assist. Returned to room and ended session in supine, all needs in reach. Missed 15 min of skilled PT 2/2 anxiety/refusal.   Therapy Documentation Precautions:  Precautions Precautions: Fall, Other (comment) Precaution Comments: watch BP, dizziness  Restrictions Weight Bearing Restrictions: No Vital Signs: Therapy Vitals Temp: 97.6 F (36.4  C) Temp Source: Oral Pulse Rate: 72 Resp: 18 BP: 135/64 Patient Position (if appropriate): Sitting Oxygen Therapy SpO2: 95 % O2 Device: Room Air Pain: Pain Assessment Pain Scale: 0-10 Pain Score: 0-No pain  Therapy/Group: Individual Therapy  Imagine Nest K Haillee Johann 01/03/2018, 4:30 PM

## 2018-01-04 ENCOUNTER — Inpatient Hospital Stay (HOSPITAL_COMMUNITY): Payer: Medicare Other | Admitting: Speech Pathology

## 2018-01-04 ENCOUNTER — Inpatient Hospital Stay (HOSPITAL_COMMUNITY): Payer: Self-pay

## 2018-01-04 ENCOUNTER — Inpatient Hospital Stay (HOSPITAL_COMMUNITY): Payer: Medicare Other

## 2018-01-04 MED ORDER — CLONAZEPAM 0.5 MG PO TBDP
0.5000 mg | ORAL_TABLET | Freq: Two times a day (BID) | ORAL | Status: DC
  Administered 2018-01-04 – 2018-01-10 (×12): 0.5 mg via ORAL
  Filled 2018-01-04 (×12): qty 1

## 2018-01-04 NOTE — Progress Notes (Signed)
[place] PHYSICAL MEDICINE & REHABILITATION PROGRESS NOTE   Subjective/Complaints:  Patient states that she cannot wait for her next Klonopin dose.  No new issues overnight.  No dizziness or headache.  ROS: Denies CP, SOB, N/V/D  Objective:   No results found. No results for input(s): WBC, HGB, HCT, PLT in the last 72 hours. Recent Labs    01/03/18 0553  CREATININE 0.86    Intake/Output Summary (Last 24 hours) at 01/04/2018 1005 Last data filed at 01/04/2018 0846 Gross per 24 hour  Intake 360 ml  Output -  Net 360 ml     Physical Exam: Vital Signs Blood pressure (!) 146/71, pulse 77, temperature (!) 97.4 F (36.3 C), resp. rate 18, height 5\' 2"  (1.575 m), weight 58.3 kg, SpO2 96 %. Constitutional: No distress . Vital signs reviewed. HENT: Normocephalic.  Atraumatic. Eyes: EOMI. No discharge. Cardiovascular: RRR.  No JVD. Respiratory: CTA bilaterally.  Normal effort. GI: BS +. Non-distended. Musc: No edema or tenderness in extremities. Neurologic: Alert Motor: 4/5 in bilateral deltoid, bicep, tricep, grip Right lower extremity: hip flexor 4--4/5, knee extensors 4/5, ankle dorsiflexor 4+/5, stable Left lower extremity: hip flexor 4/5, knee extensors 4-4+/5, ankle dorsiflexor 4+/5, stable Skin: Warm and dry.  Intact.  Assessment/Plan: 1. 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  Physiatrist is providing close team supervision and 24 hour management of active medical problems listed below.  Physiatrist and rehab team continue to assess barriers to discharge/monitor patient progress toward functional and medical goals  Care Tool:  Bathing  Bathing activity did not occur: Refused Body parts bathed by patient: Right arm, Left arm, Right upper leg, Left upper leg, Face, Chest, Abdomen, Front perineal area   Body parts bathed by helper:  Buttocks, Right lower leg, Left lower leg Body parts n/a: Chest, Abdomen   Bathing assist Assist Level: Moderate Assistance - Patient 50 - 74%     Upper Body Dressing/Undressing Upper body dressing Upper body dressing/undressing activity did not occur (including orthotics): Refused What is the patient wearing?: Pull over shirt    Upper body assist Assist Level: Minimal Assistance - Patient > 75%    Lower Body Dressing/Undressing Lower body dressing    Lower body dressing activity did not occur: Refused What is the patient wearing?: Pants, Incontinence brief     Lower body assist Assist for lower body dressing: Maximal Assistance - Patient 25 - 49%     Toileting Toileting Toileting Activity did not occur (Clothing management and hygiene only): Safety/medical concerns(she reported not needing to void this session and reported some incontinence)  Toileting assist Assist for toileting: Maximal Assistance - Patient 25 - 49%     Transfers Chair/bed transfer  Transfers assist  Chair/bed transfer activity did not occur: Safety/medical concerns  Chair/bed transfer assist level: Minimal Assistance - Patient > 75%     Locomotion Ambulation   Ambulation assist   Ambulation activity did not occur: Safety/medical concerns(dizziness/BP )  Assist level: Minimal Assistance - Patient > 75% Assistive device: Walker-rolling Max distance: 100'   Walk 10 feet activity   Assist  Walk 10 feet activity did not occur: Safety/medical concerns(dizziness/BP )  Assist level: Minimal Assistance - Patient > 75% Assistive device: Walker-rolling   Walk 50 feet activity   Assist Walk 50 feet with 2 turns activity did not occur: Safety/medical concerns(dizziness/BP )  Assist level: Minimal Assistance - Patient > 75% Assistive  device: Walker-rolling    Walk 150 feet activity   Assist Walk 150 feet activity did not occur: Safety/medical concerns(dizziness/BP )         Walk 10  feet on uneven surface  activity   Assist Walk 10 feet on uneven surfaces activity did not occur: Safety/medical concerns(dizziness/BP)         Wheelchair     Assist Will patient use wheelchair at discharge?: (TBD ) Type of Wheelchair: (TBD) Wheelchair activity did not occur: Safety/medical concerns(dizziness/BP)  Wheelchair assist level: Minimal Assistance - Patient > 75% Max wheelchair distance: 26'    Wheelchair 50 feet with 2 turns activity    Assist    Wheelchair 50 feet with 2 turns activity did not occur: Safety/medical concerns(dizziness/BP)   Assist Level: Minimal Assistance - Patient > 75%   Wheelchair 150 feet activity     Assist Wheelchair 150 feet activity did not occur: Safety/medical concerns(dizziness/BP)          Medical Problem List and Plan: 1.Decreased functional mobility with dysarthria/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 DVT Prophylaxis/Anticoagulation: subcutaneous Lovenox. Monitor for any bleeding episodes 3. Pain Management:Tylenol as needed 4. Mood:Ativan 1 mg 3 times a day as needed, changed to Klonopin 0.25 3 times daily on 11/14, decreased to twice daily on 11/16 per patient preference 5. Neuropsych: This patientiscapable of making decisions on herown behalf. 6. Skin/Wound Care:routine skin checks 7. Fluids/Electrolytes/Nutrition:Encourage PO intake.  8.Dysphagia.   D2 thins, encourage fluid intake, 340 mL on 01/03/2018 9. Hypertension.Tenormin 25 mg daily. Monitor with increased mobility Vitals:   01/03/18 1915 01/04/18 0544  BP: 115/85 (!) 146/71  Pulse: 70 77  Resp: 18 18  Temp: 98 F (36.7 C) (!) 97.4 F (36.3 C)  SpO2: 99% 96%   Cozaar decreased to 12.5 and 11/17  per neuro desired range 130-160  systolic, changed parameter for clonidine   In range 11/19- lying to sitting without ortho drops, DC TED hose 10. Hypothyroidism. Synthroid 11. Hyperlipidemia. Zetia 12.Diet-controlled diabetes mellitus. Hemoglobin A1c 6.5. CBGs discontinued  Discussed podiatry does not provide inpt services 13.  HypoK- no diuretic, likely nutritional   Added supplement   Potassium 4.0 on 11/15  14.  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  Nausea likely due to CVA added Zofran prn  16.  Hx migraine, now controlled  used tenormin at home   Topamax increased to  50mg  bid, prn tramadol added as well 17.  Hyponatremia  Sodium 136 on 11/15  Labs ordered for tomorrow 18.  Renal artery stenosis  Ultrasound on 11/14 ingesting 1 to 59% bilateral stenosis  Continue current plan, follow-up with vascular in 6 months.  LOS: 15 days A FACE TO FACE EVALUATION WAS PERFORMED  Erick Colace 01/04/2018, 10:05 AM

## 2018-01-04 NOTE — Progress Notes (Signed)
Occupational Therapy Session Note  Patient Details  Name: Brandy Flowers MRN: [id number] Date of Birth: 05/03/1940  Today's Date: 01/04/2018 OT Individual Time: 1420-1500 OT Individual Time Calculation (min): 40 min    Short Term Goals: Week 2:  OT Short Term Goal 1 (Week 2): Pt will complete BSC transfers with min A of 1 person. OT Short Term Goal 2 (Week 2): Pt will be able to self cleanse post toileting with min A. OT Short Term Goal 3 (Week 2): Pt will don shirt with set up.  OT Short Term Goal 4 (Week 2): Pt will be able to tolerate standing with RW for 2 minutes to engage in LB cleansing.  OT Short Term Goal 5 (Week 2): Pt will tolerate sitting unsupported for at least 30 minutes to allow her to engage in a shower.  Skilled Therapeutic Interventions/Progress Updates:    Session focused on functional mobility and B UE strengthening. Pt completed bed mobility with (S). Sitting EOB pt's BP was 128/66 no c/o dizziness. Pt reported that she had transferred herself from recliner to bed- RN made aware and pt edu re fall prevention policy. Pt completed 150 ft of functional mobility with CGA and RW. Pt required seated rest break before requesting to return to room. Pt with high SOB following mobility but able to regain with extended rest break. Pt returned to room in similar fashion and got back into bed, requiring cueing for safety awareness and activity pacing. Pt completed B UE strengthening circuit with 3lb dumbbells with cueing provided for technique. Pt c/o pain in R flank and an ice pack was provided and pt edu re 20/20 ice principle. Pt was left supine in bed with all needs met and bed alarm set.   Therapy Documentation Precautions:  Precautions Precautions: Fall, Other (comment) Precaution Comments: watch BP, dizziness  Restrictions Weight Bearing Restrictions: No   Vital Signs: Therapy Vitals Temp: (!) 97.4 F (36.3 C) Pulse Rate: 77 Resp: 20 BP: (!) 126/94 Patient Position  (if appropriate): Sitting Oxygen Therapy SpO2: 96 % O2 Device: Room Air Pain: Pain Assessment Pain Scale: 0-10 Pain Score: 7  Pain Type: Acute pain Pain Location: Flank Pain Orientation: Right Pain Descriptors / Indicators: Aching Pain Frequency: Intermittent Pain Onset: On-going Patients Stated Pain Goal: 0 Pain Intervention(s): Cold applied  Therapy/Group: Individual Therapy  Curtis Sites 01/04/2018, 3:55 PM

## 2018-01-04 NOTE — Patient Care Conference (Signed)
Inpatient RehabilitationTeam Conference and Plan of Care Update Date: 01/04/2018   Time: 11:05 AM    Patient Name: Brandy Flowers      Medical Record Number: [id number]  Date of Birth: Feb 17, 1940 Sex: Female         Room/Bed: 4W26C/4W26C-01 Payor Info: Payor: MEDICARE / Plan: MEDICARE PART A AND B / Product Type: *No Product type* /    Admitting Diagnosis: B CVA  Admit Date/Time:  12/20/2017  2:48 PM Admission Comments: No comment available   Primary Diagnosis:  <principal problem not specified> Principal Problem: <principal problem not specified>  Patient Active Problem List   Diagnosis Date Noted  . Renal artery stenosis (HCC)   . Hyponatremia   . Labile blood pressure   . 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 Oropharyngeal dysphagia   . Slurred speech   . Diastolic dysfunction   . Dysphagia, post-stroke   . Hypokalemia   . Stroke (cerebrum) (HCC) 12/17/2017  . Anxiety state 12/17/2017  . 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 Left renal artery stenosis (HCC) 12/05/2014  . 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 Hypertension 09/20/2013  . Hypothyroid 09/20/2013    Expected Discharge Date: Expected Discharge Date: 01/10/18  Team Members Present: Physician leading conference: Dr. Claudette Laws Social Worker Present: Dossie Der, LCSW Nurse Present: Kennyth Arnold, RN PT Present: Woodfin Ganja, PT OT Present: Roney Mans, OT;Ardis Rowan, COTA SLP Present: Feliberto Gottron, SLP PPS Coordinator present : Tora Duck, RN, CRRN     Current Status/Progress Goal Weekly Team Focus  Medical   Headaches improving, dizziness improving, anxiety somewhat better on higher dose of Klonopin but still reduced compared to baseline  Reduce fall risk, reduce stroke recurrence risk  Discharge planning   Bowel/Bladder   incontinent of bowel and bladder, LBM 11-19  mod assist with toileting, maintain regular bowel pattern  Timed  toileting q2   Swallow/Nutrition/ Hydration   Dys. 2 textures with thin liquids, Supervision-Min A  Supervision  Increased use of strategies    ADL's   min/mod A for BADLs, ongoing reported dizziness barrier to progression towards independence in BADLs  supervision  OOB tolerance, BADL retraining at seated level and sit<>stand, activity tolerance   Mobility   min assist overall, ambulating household distances w/ RW.; limited by fatigue, anxiety, and dizziness  S-CGA w/ LRAD  all functional mobility and OOB tolerance   Communication   Supervision-Mod I   Mod I  use of speech intelligibility strategies at conversation level    Safety/Cognition/ Behavioral Observations  Min A   Supervision   complex problem solving, recall and awareness    Pain   headached control with pern tylenol/ultram  pain <3  Assess pain q shift and prn   Skin   no skin issues  no skin issues  Assess skin q shift and prn      *See Care Plan and progress notes for long and short-term goals.     Barriers to Discharge  Current Status/Progress Possible Resolutions Date Resolved   Physician    Medical stability;Decreased caregiver support     Progressing with therapies  No further medication changes planned, family training, identify caregivers      Nursing                  PT  Behavior;Decreased caregiver support;Incontinence  Pt very anxious at times and w/ poor self-efficacy, lives alone and unsure if any friends/family would be able  to provide 24/7 assist              OT                  SLP                SW                Discharge Planning/Teaching Needs:  Pt plans on hiring caregivers aware one more week then dsicahrge home. Pt very anxious and self limits herself in therapies. Being followed by neuro-psych. Refuses NHP      Team Discussion:  Progressing toward her goals of supervision-CGA. Still experiencing dizziness and nausea. Still having headaches-MD aware and have adjusted meds. Self  limiting and very distracted seems to look for ways to not participate in therapies.  Go home on Dys 2 thin liquid diet.  Revisions to Treatment Plan:  DC 11/26    Continued Need for Acute Rehabilitation Level of Care: The patient requires daily medical management by a physician with specialized training in physical medicine and rehabilitation for the following conditions: Daily direction of a multidisciplinary physical rehabilitation program to ensure safe treatment while eliciting the highest outcome that is of practical value to the patient.: Yes Daily medical management of patient stability for increased activity during participation in an intensive rehabilitation regime.: Yes Daily analysis of laboratory values and/or radiology reports with any subsequent need for medication adjustment of medical intervention for : Neurological problems;Blood pressure problems;Mood/behavior problems   I attest that I was present, lead the team conference, and concur with the assessment and plan of the team.   Lucy Chrisupree, Mujahid Jalomo G 01/06/2018, 9:44 AM

## 2018-01-04 NOTE — Progress Notes (Signed)
Social Work Patient ID: Brandy Flowers, female   DOB: 1940-09-06, 77 y.o.   MRN: [id number] Met with pt to discuss team conference goals supervision-min assist level and discharge 11/26. Discussed again the options she states: "You all can't make me do what I don't want to do." She is referring to going to a NH. She feels her brother is pushing for this. She states: " He is a shit." She feels her son doesn't want her to go live with him but she plans to ask his girlfriend to pay for caregivers for her since she has assets. She wants to work on going home with caregivers and the home health services. She voiced: " I can make my own decisions." Will work on the plan pt has chosen.

## 2018-01-04 NOTE — Progress Notes (Signed)
Physical Therapy Weekly Progress Note  Patient Details  Name: Brandy Flowers MRN: [id number] Date of Birth: [birth year]  Beginning of progress report period: December 29, 2017 End of progress report period: January 04, 2018  Today's Date: 01/04/2018 PT Individual Time: 1000-1058 and 1130-1156 PT Individual Time Calculation (min): 58 min and 26 minutes  Patient has met 3 of 4 short term goals.  Pt is progressing towards long term goals performing functional mobility tasks at a min assist level, including ambulation up to 100 ft with the RW. Pt continues to be limited by dizziness and fatigue, impacting all mobility. Pt also demonstrates self limiting behaviors at times contributing to slow progression with mobility overall.   Patient continues to demonstrate the following deficits muscle weakness, decreased coordination and decreased standing balance, hemiplegia and decreased balance strategies and therefore will continue to benefit from skilled PT intervention to increase functional independence with mobility.  Patient progressing toward long term goals..  Continue plan of care.  PT Short Term Goals Week 2:  PT Short Term Goal 1 (Week 2): Pt will ambulate 25' w/ LRAD, min assist PT Short Term Goal 1 - Progress (Week 2): Met PT Short Term Goal 2 (Week 2): Pt will tolerate 30 min of OOB activity w/o increase in fatigue PT Short Term Goal 2 - Progress (Week 2): Progressing toward goal PT Short Term Goal 3 (Week 2): Pt will perform bed mobility w/ min assist PT Short Term Goal 3 - Progress (Week 2): Met PT Short Term Goal 4 (Week 2): Pt will maintain dynamic standing balance w/ min assist PT Short Term Goal 4 - Progress (Week 2): Met Week 3:  PT Short Term Goal 1 (Week 3): STG=LTG due to ELOS  Skilled Therapeutic Interventions/Progress Updates:    Session 1: Pt supine in bed upon PT arrival, agreeable to therapy tx and denies pain. Pt transferred from supine>sitting EOB with supervision and  use of bedrails. Pt reports dizziness 9/10 while sitting EOB. Pt seated EOB donned socks and pants with stand by assist, performed sit<>stand with min assist and RW to pull pants over hips. Stand pivot transfer from bed>w/c with min assist and transported to the rehab apartment. Pt ambulated from w/c<>bed in apartment with RW and min assist 2 x 6 ft. Pt performed bed mobility on apartment bed to simulate home set up, sit<>supine with min assist. Pt supine on bed performed LE strengthening exercises x 10 bridges and x 10 SLR's. Pt transported to gym. Pt ascended/descended 3 steps with R rail and min assist, B UE support on rail and step to pattern. Pt performed stand pivot to mat with min assist and RW. Pt worked on standing balance on airex with and without UE support this session, min assist for balance. Pt tearful throughout entire session, requiring max encouragement for participation. Pt transported back to gym and transferred to bed min assist. Left supine in bed with needs in reach and bed alarm set.    Session 2: Pt supine in bed upon PT arrival, agreeable to therapy tx and denies pain. Pt donned pants while supine in bed, assist to loop LEs through. Pt performed x 10 SLRs and x 10 heel slides bilaterally for strengthening. Pt transferred to sitting EOB with CGA and performed sit<>stand min assist with RW. Pt ambulated x 60 ft with RW and min assist, verbal cues for upright posture and increased step length. Pt ambulated to recliner and left seated with chair alarm set and needs in reach.  Therapy Documentation Precautions:  Precautions Precautions: Fall, Other (comment) Precaution Comments: watch BP, dizziness  Restrictions Weight Bearing Restrictions: No     Therapy/Group: Individual Therapy  Netta Corrigan, PT, DPT 01/04/2018, 8:16 AM

## 2018-01-04 NOTE — Progress Notes (Signed)
Speech Language Pathology Weekly Progress and Session Note  Patient Details  Name: Brandy Flowers MRN: [id number] Date of Birth: 14-Feb-1941  Beginning of progress report period: December 28, 2017 End of progress report period: January 04, 2018  Today's Date: 01/04/2018 SLP Individual Time: 0720-0815 SLP Individual Time Calculation (min): 55 min  Short Term Goals: Week 2: SLP Short Term Goal 1 (Week 2): Patient will utilize speech intelligibility strategies at the conversation level with Supervision verbal cues to achieve ~90% intelligibility.  SLP Short Term Goal 1 - Progress (Week 2): Met SLP Short Term Goal 2 (Week 2): Patient will consume current diet with minimal overt s/s of aspiration with Min A verbal cues for use of swallowing compensatory strategies.  SLP Short Term Goal 2 - Progress (Week 2): Met SLP Short Term Goal 3 (Week 2): Patient will demonstrate selective attention to functional tasks in a mildly distracting enviornment for ~25 minutes with Min A verbal cues for redirection.  SLP Short Term Goal 3 - Progress (Week 2): Met SLP Short Term Goal 4 (Week 2): Patient will recall new, daily information with Min A verbal cues.  SLP Short Term Goal 4 - Progress (Week 2): Not met SLP Short Term Goal 5 (Week 2): Patient will demonstrate functional problem solving for mildly complex tasks with supervision verbal cues.  SLP Short Term Goal 5 - Progress (Week 2): Not met    New Short Term Goals: Week 3: SLP Short Term Goal 1 (Week 3): Patient will demonstrate selective attention to functional tasks in a mildly distracting enviornment for ~25 minutes with supervision verbal cues for redirection.  SLP Short Term Goal 2 (Week 3): Patient will demonstrate functional problem solving for mildly complex tasks with supervision verbal cues.  SLP Short Term Goal 3 (Week 3): Patient will recall new, daily information with Min A verbal cues.  SLP Short Term Goal 4 (Week 3): Patient will utilize  speech intelligibility strategies at the conversation level with Mod I to achieve ~90% intelligibility.  SLP Short Term Goal 5 (Week 3): Patient will consume current diet with minimal overt s/s of aspiration with supervision verbal cues for use of swallowing compensatory strategies.   Weekly Progress Updates: Patient has made functional gains and has met 3 of 5 STGs this reporting period due to improved swallowing and cognitive functioning. Currently, patient is consuming Dys. 2 textures with thin liquids with minimal overt s/s of aspiration and overall Min A verbal cues for use of swallowing compensatory strategies. Patient demonstrates increased overt s/s of aspiration with solids due to attempting to verbalize with a full oral cavity. Therefore, recommend patient continue current diet. Patient also demonstrates improved overall selective attention to tasks but continues to require overall Min A verbal cues for complex problem solving and recall of new but functional information. Patient's function impacted by anxiety at times. Patient and family education ongoing. Patient would benefit from continued skilled SLP intervention to maximize her cognitive and swallowing function prior to discharge home.      Intensity: Minumum of 1-2 x/day, 30 to 90 minutes Frequency: 3 to 5 out of 7 days Duration/Length of Stay: 01/10/18 Treatment/Interventions: Cognitive remediation/compensation;Dysphagia/aspiration precaution training;Internal/external aids;Speech/Language facilitation;Cueing hierarchy;Environmental controls;Therapeutic Activities;Patient/family education;Functional tasks   Daily Session  Skilled Therapeutic Interventions: Skilled treatment session focused on dysphagia and cognitive goals. SLP facilitated session by providing intermittent supervision verbal cues for use of swallowing compensatory strategies with breakfast meal of Dys. 2 textures with thin liquids. Patient consumed meal with overt  cough  X 1, suspect due to impaired timing/coordination of swallow function. SLP also initiated discussion in regards to current textures in which patient stated she feels comfortable on current diet and does not want to upgrade at this time. Therefore, recommend patient continue current diet. Patient anxious and tearful throughout session with RN aware. Patient requested to use the [facility] and required Mod A multimodal cues for sequencing with task and extra time. Patient continent of bowel but was extremely tearful and feaeful throughout transfer and peri care requiring max encouragement.   Patient transferred back to bed and left with RN present administerting medications. Continue with current plan of care.     Pain No/Denies Pain   Therapy/Group: Individual Therapy  Huel Centola 01/04/2018, 8:18 AM

## 2018-01-05 ENCOUNTER — Inpatient Hospital Stay (HOSPITAL_COMMUNITY): Payer: Medicare Other | Admitting: Speech Pathology

## 2018-01-05 ENCOUNTER — Inpatient Hospital Stay (HOSPITAL_COMMUNITY): Payer: Medicare Other | Admitting: Physical Therapy

## 2018-01-05 ENCOUNTER — Inpatient Hospital Stay (HOSPITAL_COMMUNITY): Payer: Medicare Other | Admitting: Occupational Therapy

## 2018-01-05 NOTE — Progress Notes (Signed)
Occupational Therapy Session Note  Patient Details  Name: Brandy GarnetBarbara Flowers MRN: [id number] Date of Birth: 11/09/1940  Today's Date: 01/05/2018 OT Individual Time: [id number] OT Individual Time Calculation (min): 35 min    Short Term Goals: Week 2:  OT Short Term Goal 1 (Week 2): Pt will complete BSC transfers with min A of 1 person. OT Short Term Goal 2 (Week 2): Pt will be able to self cleanse post toileting with min A. OT Short Term Goal 3 (Week 2): Pt will don shirt with set up.  OT Short Term Goal 4 (Week 2): Pt will be able to tolerate standing with RW for 2 minutes to engage in LB cleansing.  OT Short Term Goal 5 (Week 2): Pt will tolerate sitting unsupported for at least 30 minutes to allow her to engage in a shower.  Skilled Therapeutic Interventions/Progress Updates:    1:1 Pt in the recliner when arrived. Pt ambulated with RW (on the way to the w/c by the sink) with min guard but therapist then realized pt wet from incontience episode in the brief; continued to ambulate to the bathroom to the toilet. Pt required max A for LB clothing management and removal of wet brief. Pt agreable to showering today. Pt bathed with overall min A sit to stand with encouragement. Pt demonstrated decr anxious behaviors when engaged in a conversation about [place]. Pt dressed UB with setup and Lower body with socks with mod A. A with getting her back into bed with min A for stand pivot.   Therapy Documentation Precautions:  Precautions Precautions: Fall, Other (comment) Precaution Comments: watch BP, dizziness  Restrictions Weight Bearing Restrictions: No   Pain: No c/o pain in session   Therapy/Group: Individual Therapy  Roney MansSmith, Aileene Lanum [facility] 01/05/2018, 8:08 PM

## 2018-01-05 NOTE — Progress Notes (Signed)
Pt remains with labile emotions. Pt was crying and complaining of anxiety. Pt was upset with relationship with family members.

## 2018-01-05 NOTE — Progress Notes (Signed)
Social Work Patient ID: Brandy GarnetBarbara Flowers, female   DOB: 03/28/1940, 77 y.o.   MRN: [id number] Pt requested to see me and informed worker she has changed her mind and will go to a SNF form rehab. She states: " My brother stormed in and told her he was done if she wouldn't do what she needed to do." Have contacted Chip to inform him of pt's change of heart and plan now. Will give pt a NH list to look at her options. Will begin working on this plan.

## 2018-01-05 NOTE — Progress Notes (Signed)
Physical Therapy Session Note  Patient Details  Name: Brandy GarnetBarbara Flowers MRN: [id number] Date of Birth: 08/31/1940  Today's Date: 01/05/2018 PT Individual Time: 1300-1355 PT Individual Time Calculation (min): 55 min   Short Term Goals: Week 3:  PT Short Term Goal 1 (Week 3): STG=LTG due to ELOS  Skilled Therapeutic Interventions/Progress Updates:   Pt in w/c and agreeable to therapy, denies pain but reports feeling overall weaker today. Pt w/ moments of hyperventilation and increased anxiety, but easily redirectable w/ encouragement and therapeutic listening to her concerns. Session focused on overall tolerance to OOB activity and global strengthening. Transferred to EOB w/ min assist, increased time, and max encouragement. All stand pivot transfers performed w/ min assist this session and tactile/verbal cues for upright posture. Total assist w/c transport to/from therapy gym. Performed NuStep 5 min x2 @ level 1 for LE strengthening and gentle ROM of LEs. Pt intermittently tearful and c/o R hip pain - made RN aware. Pt encouraged by her ability to participate today despite feeling weak. Returned to room and ended session in supine, all needs in reach. Hot pack applied to R lateral hip for pain relief. Pt asking what therapist thought about going to SNF vs home. Educated pt on benefits of SNF in her case as it would continue provide rehab and someone to assist her 24/7. She appeared more open to the idea of SNF at d/c but continued to state she was going to ask her brother about paying out of pocket for 24/7 nurse at home.   Therapy Documentation Precautions:  Precautions Precautions: Fall, Other (comment) Precaution Comments: watch BP, dizziness  Restrictions Weight Bearing Restrictions: No Vital Signs:  Pain: Pain Assessment Pain Scale: 0-10 Pain Score: 0-No pain  Therapy/Group: Individual Therapy  [name] 01/05/2018, 2:38 PM

## 2018-01-05 NOTE — Progress Notes (Signed)
Social Work Patient ID: Eugene GarnetBarbara Buchler, female   DOB: 06/21/1940, 77 y.o.   MRN: [id number] Spoke with Chip-brother to inform him I have not gotten a call from pt's son. According to son he has left several messages for this worker, which is not true. Have received number from Chip for son and have left him a message to contact me.

## 2018-01-05 NOTE — Progress Notes (Addendum)
[address] PHYSICAL MEDICINE & REHABILITATION PROGRESS NOTE   Subjective/Complaints:  Pt happy about increased klonopin dose, we discussed that she is overall on less benzodiazepine than at homewhich is helpful in avoiding falls Discussed care needs and d/c with pt and her brother.  THey are hoping her son will fund a hired caregiver ROS: Denies CP, SOB, N/V/D  Objective:   No results found. No results for input(s): WBC, HGB, HCT, PLT in the last 72 hours. Recent Labs    01/03/18 0553  CREATININE 0.86    Intake/Output Summary (Last 24 hours) at 01/05/2018 0750 Last data filed at 01/04/2018 1846 Gross per 24 hour  Intake 364 ml  Output -  Net 364 ml     Physical Exam: Vital Signs Blood pressure 131/60, pulse 72, temperature 97.9 F (36.6 C), resp. rate 16, height 5\' 2"  (1.575 m), weight 58.3 kg, SpO2 97 %. Constitutional: No distress . Vital signs reviewed. HENT: Normocephalic.  Atraumatic. Eyes: EOMI. No discharge. Cardiovascular: RRR.  No JVD. Respiratory: CTA bilaterally.  Normal effort. GI: BS +. Non-distended. Musc: No edema or tenderness in extremities. Neurologic: Alert Motor: 4/5 in bilateral deltoid, bicep, tricep, grip Right lower extremity: hip flexor 4--4/5, knee extensors 4/5, ankle dorsiflexor 4+/5, stable Left lower extremity: hip flexor 4/5, knee extensors 4-4+/5, ankle dorsiflexor 4+/5, stable Skin: Warm and dry.  Intact.  Assessment/Plan: 1. 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  Physiatrist is providing close team supervision and 24 hour management of active medical problems listed below.  Physiatrist and rehab team continue to assess barriers to discharge/monitor patient progress toward functional and medical goals  Care Tool:  Bathing  Bathing activity did not occur: Refused Body parts bathed by patient: Right arm,  Left arm, Right upper leg, Left upper leg, Face, Chest, Abdomen, Front perineal area   Body parts bathed by helper: Buttocks, Right lower leg, Left lower leg Body parts n/a: Chest, Abdomen   Bathing assist Assist Level: Moderate Assistance - Patient 50 - 74%     Upper Body Dressing/Undressing Upper body dressing Upper body dressing/undressing activity did not occur (including orthotics): Refused What is the patient wearing?: Pull over shirt    Upper body assist Assist Level: Minimal Assistance - Patient > 75%    Lower Body Dressing/Undressing Lower body dressing    Lower body dressing activity did not occur: Refused What is the patient wearing?: Pants, Incontinence brief     Lower body assist Assist for lower body dressing: Maximal Assistance - Patient 25 - 49%     Toileting Toileting Toileting Activity did not occur (Clothing management and hygiene only): Safety/medical concerns(she reported not needing to void this session and reported some incontinence)  Toileting assist Assist for toileting: Maximal Assistance - Patient 25 - 49%     Transfers Chair/bed transfer  Transfers assist  Chair/bed transfer activity did not occur: Safety/medical concerns  Chair/bed transfer assist level: Contact Guard/Touching assist     Locomotion Ambulation   Ambulation assist   Ambulation activity did not occur: Safety/medical concerns(dizziness/BP )  Assist level: Contact Guard/Touching assist Assistive device: Walker-rolling Max distance: 150 ft   Walk 10 feet activity   Assist  Walk 10 feet activity did not occur: Safety/medical concerns(dizziness/BP )  Assist level: Contact Guard/Touching assist Assistive device: Walker-rolling   Walk 50 feet activity   Assist Walk 50 feet with 2 turns activity did not occur: Safety/medical  concerns(dizziness/BP )  Assist level: Contact Guard/Touching assist Assistive device: Walker-rolling    Walk 150 feet activity   Assist  Walk 150 feet activity did not occur: Safety/medical concerns(dizziness/BP )  Assist level: Contact Guard/Touching assist Assistive device: Walker-rolling    Walk 10 feet on uneven surface  activity   Assist Walk 10 feet on uneven surfaces activity did not occur: Safety/medical concerns(dizziness/BP)         Wheelchair     Assist Will patient use wheelchair at discharge?: (TBD ) Type of Wheelchair: (TBD) Wheelchair activity did not occur: Safety/medical concerns(dizziness/BP)  Wheelchair assist level: Minimal Assistance - Patient > 75% Max wheelchair distance: 19'    Wheelchair 50 feet with 2 turns activity    Assist    Wheelchair 50 feet with 2 turns activity did not occur: Safety/medical concerns(dizziness/BP)   Assist Level: Minimal Assistance - Patient > 75%   Wheelchair 150 feet activity     Assist Wheelchair 150 feet activity did not occur: Safety/medical concerns(dizziness/BP)          Medical Problem List and Plan: 1.Decreased functional mobility with dysarthria/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 DVT Prophylaxis/Anticoagulation: subcutaneous Lovenox. Monitor for any bleeding episodes 3. Pain Management:Tylenol as needed 4. Mood:Ativan 1 mg 3 times a day as needed, changed to Klonopin 0.25 3 times daily on 11/14, decreased to twice daily on 11/16 per patient preference 5. Neuropsych: This patientiscapable of making decisions on herown behalf. 6. Skin/Wound Care:routine skin checks 7. Fluids/Electrolytes/Nutrition:Encourage PO intake.  8.Dysphagia.   D2 thins, encourage fluid intake, 340 mL on 01/03/2018 9. Hypertension.Tenormin 25 mg daily. Monitor with increased mobility Vitals:   01/05/18 0434 01/05/18 0436  BP: (!) 147/62  131/60  Pulse: 69 72  Resp:    Temp:    SpO2: 97%    Cozaar decreased to 12.5 and 11/17  per neuro desired range 130-160 systolic, changed parameter for clonidine   In range 11/19- lying to sitting without ortho drops, DC TED hose 10. Hypothyroidism. Synthroid 11. Hyperlipidemia. Zetia 12.Diet-controlled diabetes mellitus. Hemoglobin A1c 6.5. CBGs discontinued  Discussed podiatry does not provide inpt services 13.  HypoK- no diuretic, likely nutritional  Resolved on  supplement     14.  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  Nausea likely due to CVA added Zofran prn  16.  Hx migraine, now controlled  used tenormin at home   Topamax increased to  50mg  bid, prn tramadol added as well 17.  Hyponatremia- resolved     18.  Renal artery stenosis  BP controlled   Continue current plan, follow-up with vascular in 6 months.  LOS: 16 days A FACE TO FACE EVALUATION WAS PERFORMED  Erick Colace 01/05/2018, 7:50 AM

## 2018-01-05 NOTE — Progress Notes (Signed)
Physical Therapy Session Note  Patient Details  Name: Brandy Flowers MRN: [id number] Date of Birth: 03/01/1940  Today's Date: 01/05/2018 PT Individual Time: 0900-1015 PT Individual Time Calculation (min): 75 min   Short Term Goals: Week 3:  PT Short Term Goal 1 (Week 3): STG=LTG due to ELOS  Skilled Therapeutic Interventions/Progress Updates:    Patient received in bed, very anxious and emotionally volatile today and going from smiling with therapist to crying during session; when asked why she was upset gave variable answers from being anxious regarding mobility, not wanting to go to SNF; also on one attempt asked therapist if they thought nursing staff would give her adderall, when asked why she said "Speed!!!" and continued talking about drugs, RN aware of this turn of events. VSS throughout session. She required MinA for functional bed mobility, abdominal binder applied at EOB with totalA, and MinA for transfers to and from [facility] with RW and Mod cues for safety, which patient inconsistently follows. Worked on [facility] navigation, patient crying throughout and stating "I can't do this!" even when she was actively successfully performing task. Worked on functional strengthening for hip abductors and extensors and gait trained approximately 157f this session as well. Unable to maintain balance without at least U UE support on RW for pulling clothes up today. Required extended time for many activities due to need for emotional support and encouragement from therapist to participate in therapy this morning. She was left up in recliner with seat belt alarm active, all other needs met this morning, VSS at EOS.   Therapy Documentation Precautions:  Precautions Precautions: Fall, Other (comment) Precaution Comments: watch BP, dizziness  Restrictions Weight Bearing Restrictions: No General: Pain: Pain Assessment Pain Scale: 0-10 Pain Score: 0-No pain Faces Pain Scale: No hurt Patients Stated Pain Goal:  0 Pain Intervention(s): Medication (See eMAR)    Therapy/Group: Individual Therapy  KDeniece ReePT, DPT, CBIS  Supplemental Physical [facility]rap[facility]   Pager [phone] Rehab Office [phone]  01/05/2018, 11:03 AM

## 2018-01-05 NOTE — Progress Notes (Signed)
Speech Language Pathology Daily Session Note  Patient Details  Name: Brandy GarnetBarbara Flowers MRN: [id number] Date of Birth: 08/30/1940  Today's Date: 01/05/2018 SLP Individual Time: 0720-0815 SLP Individual Time Calculation (min): 55 min  Short Term Goals: Week 3: SLP Short Term Goal 1 (Week 3): Patient will demonstrate selective attention to functional tasks in a mildly distracting enviornment for ~25 minutes with supervision verbal cues for redirection.  SLP Short Term Goal 2 (Week 3): Patient will demonstrate functional problem solving for mildly complex tasks with supervision verbal cues.  SLP Short Term Goal 3 (Week 3): Patient will recall new, daily information with Min A verbal cues.  SLP Short Term Goal 4 (Week 3): Patient will utilize speech intelligibility strategies at the conversation level with Mod I to achieve ~90% intelligibility.  SLP Short Term Goal 5 (Week 3): Patient will consume current diet with minimal overt s/s of aspiration with supervision verbal cues for use of swallowing compensatory strategies.   Skilled Therapeutic Interventions: Skilled treatment session focused on dysphagia and cognitive goals. SLP facilitated session by providing supervision verbal cues for utilization of swallowing compensatory strategies with breakfast meal of Dys. 2 textures with thin liquids. Patient consumed meal with intermittent overt s/s due to verbalizing with a full oral cavity. Recommend patient continue current diet.  SLP also facilitated session by facilitating a conversation about d/c planning in regards to anticipatory awareness. Patient became visibly upset and emotional during conversation due to anxiety and was unable to participate in conversation effectively. Will make CSW aware. Patient left upright in bed with alarm on and all needs within reach. Continue with current plan of care.       Pain No/Denies Pain   Therapy/Group: Individual Therapy  Avarey Yaeger 01/05/2018, 8:16  AM

## 2018-01-06 ENCOUNTER — Inpatient Hospital Stay (HOSPITAL_COMMUNITY): Payer: Medicare Other | Admitting: Physical Therapy

## 2018-01-06 ENCOUNTER — Inpatient Hospital Stay (HOSPITAL_COMMUNITY): Payer: Medicare Other | Admitting: Occupational Therapy

## 2018-01-06 ENCOUNTER — Inpatient Hospital Stay (HOSPITAL_COMMUNITY): Payer: Medicare Other | Admitting: Speech Pathology

## 2018-01-06 MED ORDER — DICLOFENAC SODIUM 1 % TD GEL
2.0000 g | Freq: Four times a day (QID) | TRANSDERMAL | Status: DC
  Administered 2018-01-06 – 2018-01-10 (×16): 2 g via TOPICAL
  Filled 2018-01-06: qty 100

## 2018-01-06 NOTE — Progress Notes (Signed)
Occupational Therapy Note  Patient Details  Name: Eugene GarnetBarbara Caplin MRN: [id number] Date of Birth: 08/26/1940  Attempted to see pt for make up time, however pt refusing any therapy at this time due to fatigue.  Will continue to follow per POC.  Rosalio LoudHOXIE, Mindie Rawdon 01/06/2018, 12:17 PM

## 2018-01-06 NOTE — Progress Notes (Signed)
Speech Language Pathology Daily Session Note  Patient Details  Name: Brandy GarnetBarbara Flowers MRN: [id number] Date of Birth: 10/14/1940  Today's Date: 01/06/2018 SLP Individual Time: 1415-1500 SLP Individual Time Calculation (min): 45 min  Short Term Goals: Week 3: SLP Short Term Goal 1 (Week 3): Patient will demonstrate selective attention to functional tasks in a mildly distracting enviornment for ~25 minutes with supervision verbal cues for redirection.  SLP Short Term Goal 2 (Week 3): Patient will demonstrate functional problem solving for mildly complex tasks with supervision verbal cues.  SLP Short Term Goal 3 (Week 3): Patient will recall new, daily information with Min A verbal cues.  SLP Short Term Goal 4 (Week 3): Patient will utilize speech intelligibility strategies at the conversation level with Mod I to achieve ~90% intelligibility.  SLP Short Term Goal 5 (Week 3): Patient will consume current diet with minimal overt s/s of aspiration with supervision verbal cues for use of swallowing compensatory strategies.   Skilled Therapeutic Interventions:  Pt was seen for skilled ST targeting cognitive goals.  Pt was in recliner upon therapist's arrival and was emotional about discharge plan of SNF placement post CIR.  Pt reports, "They tell me I'm going to another rehab hospital, but I know it's a nursing home."  SLP discussed rationale for SNF placement in order for pt to get more therapies prior to discharging home as her family is currently unable to provide recommended level of assist.  SLP then facilitated the session with a novel card game targeting problem solving and selective attention goals.  Pt intermittently needed supervision cues to plan and execute a problem solving strategy during task due to decreased mental flexibility but was otherwise mod I.  Pt selectively attended to task for its duration (>30 min) with no cues needed for redirection. Session was significantly limited by pt's  reports of "weakness" and "dizziness."  Pt was noted to breathe heavily at times but this appeared to subside if she was otherwise engaged in a structured task.  As session progressed, her complaints appeared to be more limiting to her participation in therapy although her symptoms did not appear to increase in intensity.  Pt requesting to return to bed. As a result, pt was returned to bed and left with all needs within reach and bed alarm set.  Session ended early due to anxiety and subjective reports of fatigue limiting participation.  Continue per current plan of care.    Pain Pain Assessment Pain Scale: 0-10 Pain Score: 0-No pain  Therapy/Group: Individual Therapy  Markies Mowatt, Melanee SpryNicole L 01/06/2018, 4:27 PM

## 2018-01-06 NOTE — Progress Notes (Signed)
[facility] PHYSICAL MEDICINE & REHABILITATION PROGRESS NOTE   Subjective/Complaints:  Patient aware of SNF placement, complains of right knee pain, states that she has had work-up for rheumatoid arthritis in [place] which was negative.  She has intermittent pain but no swelling.  No recent trauma to that area. ROS: Denies CP, SOB, N/V/D  Objective:   No results found. No results for input(s): WBC, HGB, HCT, PLT in the last 72 hours. No results for input(s): NA, K, CL, CO2, GLUCOSE, BUN, CREATININE, CALCIUM in the last 72 hours.  Intake/Output Summary (Last 24 hours) at 01/06/2018 0907 Last data filed at 01/06/2018 0854 Gross per 24 hour  Intake 520 ml  Output -  Net 520 ml     Physical Exam: Vital Signs Blood pressure (!) 142/48, pulse 66, temperature 97.8 F (36.6 C), temperature source Oral, resp. rate 12, height 5\' 2"  (1.575 m), weight 58.3 kg, SpO2 98 %. Constitutional: No distress . Vital signs reviewed. HENT: Normocephalic.  Atraumatic. Eyes: EOMI. No discharge. Cardiovascular: RRR.  No JVD. Respiratory: CTA bilaterally.  Normal effort. GI: BS +. Non-distended. Musc: No evidence of right knee effusion no erythema, there is joint line tenderness medially and laterally no tenderness over the quadriceps or patellar tendons Neurologic: Alert Motor: 4/5 in bilateral deltoid, bicep, tricep, grip Right lower extremity: hip flexor 4--4/5, knee extensors 4/5, ankle dorsiflexor 4+/5, stable Left lower extremity: hip flexor 4/5, knee extensors 4-4+/5, ankle dorsiflexor 4+/5, stable Skin: Warm and dry.  Intact.  Assessment/Plan: 1. 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  Physiatrist is providing close team supervision and 24 hour management of active medical problems listed below.  Physiatrist and rehab team continue to assess barriers to  discharge/monitor patient progress toward functional and medical goals  Care Tool:  Bathing  Bathing activity did not occur: Refused Body parts bathed by patient: Right arm, Left arm, Right upper leg, Left upper leg, Face, Chest, Abdomen, Front perineal area   Body parts bathed by helper: Buttocks, Right lower leg, Left lower leg Body parts n/a: Chest, Abdomen   Bathing assist Assist Level: Minimal Assistance - Patient > 75%     Upper Body Dressing/Undressing Upper body dressing Upper body dressing/undressing activity did not occur (including orthotics): Refused What is the patient wearing?: Pull over shirt    Upper body assist Assist Level: Set up assist    Lower Body Dressing/Undressing Lower body dressing    Lower body dressing activity did not occur: Refused What is the patient wearing?: Incontinence brief, Pants     Lower body assist Assist for lower body dressing: Minimal Assistance - Patient > 75%     Toileting Toileting Toileting Activity did not occur Press photographer and hygiene only): Safety/medical concerns(she reported not needing to void this session and reported some incontinence)  Toileting assist Assist for toileting: Maximal Assistance - Patient 25 - 49%     Transfers Chair/bed transfer  Transfers assist  Chair/bed transfer activity did not occur: Safety/medical concerns  Chair/bed transfer assist level: Minimal Assistance - Patient > 75%     Locomotion Ambulation   Ambulation assist   Ambulation activity did not occur: Safety/medical concerns(dizziness/BP )  Assist level: Contact Guard/Touching assist Assistive device: Walker-rolling Max distance: 150   Walk 10 feet activity   Assist  Walk 10 feet activity did not occur: Safety/medical concerns(dizziness/BP )  Assist level: Contact Guard/Touching assist Assistive device: Walker-rolling  Walk 50 feet activity   Assist Walk 50 feet with 2 turns activity did not occur:  Safety/medical concerns(dizziness/BP )  Assist level: Contact Guard/Touching assist Assistive device: Walker-rolling    Walk 150 feet activity   Assist Walk 150 feet activity did not occur: Safety/medical concerns(dizziness/BP )  Assist level: Contact Guard/Touching assist Assistive device: Walker-rolling    Walk 10 feet on uneven surface  activity   Assist Walk 10 feet on uneven surfaces activity did not occur: Safety/medical concerns(dizziness/BP)         Wheelchair     Assist Will patient use wheelchair at discharge?: (TBD ) Type of Wheelchair: (TBD) Wheelchair activity did not occur: Safety/medical concerns(dizziness/BP)  Wheelchair assist level: Minimal Assistance - Patient > 75% Max wheelchair distance: 20    Wheelchair 50 feet with 2 turns activity    Assist    Wheelchair 50 feet with 2 turns activity did not occur: Safety/medical concerns(dizziness/BP)   Assist Level: Minimal Assistance - Patient > 75%   Wheelchair 150 feet activity     Assist Wheelchair 150 feet activity did not occur: Safety/medical concerns(dizziness/BP)          Medical Problem List and Plan: 1.Decreased functional mobility with dysarthria/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 DVT Prophylaxis/Anticoagulation: subcutaneous Lovenox. Monitor for any bleeding episodes 3. Pain Management:Tylenol as needed 4. Mood:Ativan 1 mg 3 times a day as needed, changed to Klonopin 0.25 3 times daily on 11/14, decreased to twice daily on 11/16 per patient preference 5. Neuropsych: This patientiscapable of making decisions on herown behalf. 6. Skin/Wound Care:routine skin checks 7. Fluids/Electrolytes/Nutrition:Encourage PO intake.  8.Dysphagia.   D2 thins, encourage fluid  intake, 340 mL on 01/03/2018 9. Hypertension.Tenormin 25 mg daily. Monitor with increased mobility Vitals:   01/05/18 2025 01/06/18 0450  BP: (!) 146/55 (!) 142/48  Pulse: 63 66  Resp: 12 12  Temp: (!) 97.4 F (36.3 C) 97.8 F (36.6 C)  SpO2: 99% 98%   Cozaar decreased to 12.5 and 11/17  per neuro desired range 130-160 systolic, changed parameter for clonidine   In range 11/19- lying to sitting without ortho drops, DC TED hose 10. Hypothyroidism. Synthroid 11. Hyperlipidemia. Zetia 12.Diet-controlled diabetes mellitus. Hemoglobin A1c 6.5. CBGs discontinued  Discussed podiatry does not provide inpt services 13.  HypoK- no diuretic, likely nutritional  Resolved on  supplement     14.  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  Nausea likely due to CVA added Zofran prn  16.  Hx migraine, now controlled  used tenormin at home   Topamax increased to  50mg  bid, prn tramadol added as well 17.  Hyponatremia- resolved     18.  Renal artery stenosis  BP controlled   Continue current plan, follow-up with vascular in 6 months. 19.  Osteoarthritis of the right knee, Voltaren gel ordered LOS: 17 days A FACE TO FACE EVALUATION WAS PERFORMED  Erick Colacendrew E Louellen Haldeman 01/06/2018, 9:07 AM

## 2018-01-06 NOTE — Progress Notes (Signed)
Social Work Patient ID: Brandy Flowers, female   DOB: [birth year], 77 y.o.   MRN: [id number] Met with pt who reports she does not want to go to a NH. Discussed the next venue is a Passenger transport manager with rehab and the focus is on rehab and getting her home. She has agreed to go for a week and then go home. Her brother is aware of this plan and wants her to stay longer there. Have not heard form her son although left him a message and he has told pt's brother I have not returned his calls. See on Monday to make sure has not changed her mind again and continue to work on [facility] bed.

## 2018-01-06 NOTE — Progress Notes (Addendum)
Physical Therapy Session Note  Patient Details  Name: Brandy Flowers MRN: [id number] Date of Birth: 07-15-1940  Today's Date: 01/06/2018 PT Individual Time: 0905-1005 PT Individual Time Calculation (min): 60 min   Short Term Goals: Week 3:  PT Short Term Goal 1 (Week 3): STG=LTG due to ELOS  Skilled Therapeutic Interventions/Progress Updates:   Pt in supine and agreeable to therapy, denies pain. Session focused on OOB tolerance, increasing self-efficacy, and all functional mobility. Pt intermittently tearful w/ increased anxiety, provided therapeutic listening, encouragement, and distraction throughout session. Pt frustrated by her circumstances and overwhelmed. Transferred to EOB and provided set-up assist to don UE garments, min assist to don LE garments, and performed stand pivot to w/c w/ min assist. BP 127/59, donned abdominal binder to prevent further orthostasis w/ upright mobility. Total assist w/c transport to/from therapy gym. Remained in therapy gym majority of session in efforts to encourage pt in her participation by seeing other patients working w/ therapists. Ambulated 100' x2 w/ CGA and negotiated 3 steps w/ min assist. Pt w/ increased anxiety and hyperventilation while on stairs, pt c/o increased R leg pain. Max encouragement needed to safely continue so she could sit in a chair. Pain resolved once seated in w/c. Returned to room and performed stand pivot transfer back to EOB and to supine w/ min assist. Max cues needed for posture to prevent buckling of BLEs, c/o RLE pain only. Multiple supine bridges performed to boost in bed, w/o c/o RLE pain. Provided w/ hot pack to R lateral knee for pain relief and ended session in supine, all needs met. RN made aware of pain. Missed 15 min of skilled PT 2/2 anxiety and refusal 2/2 pain.   Therapy Documentation Precautions:  Precautions Precautions: Fall, Other (comment) Precaution Comments: watch BP, dizziness  Restrictions Weight  Bearing Restrictions: No  Therapy/Group: Individual Therapy  Leilanee Righetti K Cristina Ceniceros 01/06/2018, 10:06 AM

## 2018-01-06 NOTE — NC FL2 (Signed)
[place] MEDICAID FL2 LEVEL OF CARE SCREENING TOOL     IDENTIFICATION  Patient Name: Brandy GarnetBarbara Flaten Birthdate: 05/26/1940 Sex: female Admission Date (Current Location): 12/20/2017  [facility] [place]  [employer]   Facility and Address:  [facility], [address], [place], [place] [address]      Provider Number: [id number]  Attending Physician Name and Address:  Erick ColaceKirsteins, Andrew E, MD  Relative Name and Phone Number:  Chip Anderson-brother [phone]    Current Level of Care: Other (Comment)(Rehab) Recommended Level of Care: Skilled Nursing Facility Prior Approval Number:    Date Approved/Denied:   PASRR Number: [id number]  Discharge Plan: SNF    Current Diagnoses: Patient Active Problem List   Diagnosis Date Noted  . Renal artery stenosis (HCC)   . Hyponatremia   . Labile blood pressure   . 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 Oropharyngeal dysphagia   . Slurred speech   . Diastolic dysfunction   . Dysphagia, post-stroke   . Hypokalemia   . Stroke (cerebrum) (HCC) 12/17/2017  . Anxiety state 12/17/2017  . 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 Left renal artery stenosis (HCC) 12/05/2014  . 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 Hypertension 09/20/2013  . Hypothyroid 09/20/2013    Orientation RESPIRATION BLADDER Height & Weight     Self, Time, Situation, Place  Normal Incontinent(urgency) Weight: 128 lb 8.5 oz (58.3 kg) Height:  5\' 2"  (157.5 cm)  BEHAVIORAL SYMPTOMS/MOOD NEUROLOGICAL BOWEL NUTRITION STATUS      Continent Diet(Dys 2 thin liquid diet)  AMBULATORY STATUS COMMUNICATION OF NEEDS Skin   Limited Assist Verbally Normal                       Personal Care Assistance Level of Assistance  Bathing, Dressing Bathing Assistance: Limited assistance   Dressing Assistance: Limited assistance     Functional Limitations Info  Speech     Speech  Info: Impaired    SPECIAL CARE FACTORS FREQUENCY  PT (By licensed PT), OT (By licensed OT), Speech therapy     PT Frequency: 5x week OT Frequency: 5x week     Speech Therapy Frequency: 5x week      Contractures Contractures Info: Not present    Additional Factors Info  Code Status, Allergies, Psychotropic Code Status Info: DNR Allergies Info: Blue Dye, Sulfa Antiobiotcics, Calcium blockers Psychotropic Info: Klonopin and xanax         Current Medications (01/06/2018):  This is the current hospital active medication list Current Facility-Administered Medications  Medication Dose Route Frequency Provider Last Rate Last Dose  . acetaminophen (TYLENOL) tablet 650 mg  650 mg Oral Q4H PRN Charlton Amorngiulli, Daniel J, PA-C   650 mg at 01/05/18 1820   Or  . acetaminophen (TYLENOL) solution 650 mg  650 mg Per Tube Q4H PRN Angiulli, Mcarthur Rossettianiel J, PA-C       Or  . acetaminophen (TYLENOL) suppository 650 mg  650 mg Rectal Q4H PRN Angiulli, Mcarthur Rossettianiel J, PA-C      . aspirin suppository 300 mg  300 mg Rectal Daily Angiulli, Mcarthur Rossettianiel J, PA-C       Or  . aspirin tablet 325 mg  325 mg Oral Daily Charlton Amorngiulli, Daniel J, PA-C   325 mg at 01/06/18 0818  . atenolol (TENORMIN) tablet 25 mg  25 mg Oral Daily Kirsteins, Victorino SparrowAndrew E, MD   25 mg at 01/06/18 0818  . clonazePAM (KLONOPIN) disintegrating  tablet 0.5 mg  0.5 mg Oral BID Erick Colace, MD   0.5 mg at 01/06/18 0818  . cloNIDine (CATAPRES) tablet 0.1 mg  0.1 mg Oral TID PRN Erick Colace, MD      . clopidogrel (PLAVIX) tablet 75 mg  75 mg Oral Daily Charlton Amor, PA-C   75 mg at 01/06/18 0818  . diclofenac sodium (VOLTAREN) 1 % transdermal gel 2 g  2 g Topical QID Kirsteins, Victorino Sparrow, MD      . enoxaparin (LOVENOX) injection 40 mg  40 mg Subcutaneous Q24H AngiulliMcarthur Rossetti, PA-C   40 mg at 01/05/18 2010  . ezetimibe (ZETIA) tablet 10 mg  10 mg Oral Daily Charlton Amor, PA-C   10 mg at 01/06/18 0818  . levothyroxine (SYNTHROID, LEVOTHROID)  tablet 100 mcg  100 mcg Oral Q0600 Hammons, Gerhard Munch, RPH   100 mcg at 01/06/18 0547  . losartan (COZAAR) tablet 12.5 mg  12.5 mg Oral Daily Marcello Fennel, MD   12.5 mg at 01/06/18 1610  . ondansetron (ZOFRAN) injection 4 mg  4 mg Intravenous Q8H PRN Kirsteins, Victorino Sparrow, MD   4 mg at 12/25/17 1511  . potassium chloride SA (K-DUR,KLOR-CON) CR tablet 40 mEq  40 mEq Oral BID Erick Colace, MD   40 mEq at 01/06/18 0818  . RESOURCE THICKENUP CLEAR   Oral PRN Charlton Amor, PA-C      . scopolamine (TRANSDERM-SCOP) 1 MG/3DAYS 1.5 mg  1 patch Transdermal Q72H Kirsteins, Victorino Sparrow, MD   1.5 mg at 01/06/18 0832  . senna-docusate (Senokot-S) tablet 1 tablet  1 tablet Oral QHS PRN Charlton Amor, PA-C   1 tablet at 01/06/18 9604  . sorbitol 70 % solution 30 mL  30 mL Oral Daily PRN Charlton Amor, PA-C   30 mL at 12/26/17 0414  . topiramate (TOPAMAX) tablet 50 mg  50 mg Oral BID Erick Colace, MD   50 mg at 01/06/18 0818  . traMADol (ULTRAM) tablet 50 mg  50 mg Oral Q6H PRN Erick Colace, MD   50 mg at 01/05/18 2010     Discharge Medications: Please see discharge summary for a list of discharge medications.  Relevant Imaging Results:  Relevant Lab Results:   Additional Information SSN: [id number]  Khamani Daniely, Lemar Livings, LCSW

## 2018-01-06 NOTE — Progress Notes (Signed)
Occupational Therapy Weekly Progress Note  Patient Details  Name: Brandy Flowers MRN: [id number] Date of Birth: 05-09-1940  Beginning of progress report period: December 29, 2017 End of progress report period: January 06, 2018  Today's Date: 01/06/2018 OT Individual Time: 1304-1401 OT Individual Time Calculation (min): 57 min    Patient has met 5 of 5 short term goals.  Pt has made steady progress with OT but still needs overall min assist for toilet transfers, toileting, and LB selfcare tasks.  UB selfcare is at a supervision level.  She demonstrates decreased overall endurance at times as well as increased anxiety.  Based on current progress and need for 24 hour supervision at discharge, pt is going to transition to SNF level rehab next week.  Feel this is the safest option as pt has no family here that can assist her.  Will continue with current OT POC with LTGs downgraded to min guard to supervision instead of modified independent level based on progress and change of discharge plan.    Patient continues to demonstrate the following deficits: muscle weakness, unbalanced muscle activation and decreased coordination and decreased standing balance, decreased postural control and decreased balance strategies and therefore will continue to benefit from skilled OT intervention to enhance overall performance with BADL.  Patient not progressing toward long term goals.  See goal revision..  Continue plan of care.  OT Short Term Goals Week 3:  OT Short Term Goal 1 (Week 3): Continue working on established LTGs downgraded to supervision level overall.   Skilled Therapeutic Interventions/Progress Updates:    Pt completed supine to sit EOB with min assist during session.  Pt started session by stating that she felt more fatigued today after PT and stating that she did not know why or what happened.  Had her sit EOB and donn her right gripper socks with supervision before using the RW with min assist  to ambulate over to the sink and stand to brush her teeth.  Pt had declined bathing or showering and therapist had noticed a strong odor of urine.  Encouraged pt to check her brief once oral care was completed, with noted soiling of brief with urine.  Pt completed toilet hygiene with min assist in standing.  Max assist for donning new brief in standing.  Next had pt transfer to the wheelchair where she was then taken down to the dayroom to complete BUE exercises with use of the Ergonometer.  She needed mod encouragement and occasional min assist to complete 1 interval of 4 mins and 1 interval of 2 mins with resistance on level 3 and RPMs at 15.  Finished session with return to the room via wheelchair and transfer over to the bediside recliner for resting before next PT session.  Noted pt with frequent wimpering type noises and groining when attempting any activity.  Pt also slightly tearful on one occasion.  Feel this is mostly anxiety and fatigue.    Therapy Documentation Precautions:  Precautions Precautions: Fall, Other (comment) Precaution Comments: watch BP, dizziness  Restrictions Weight Bearing Restrictions: No   Vital Signs: Therapy Vitals Pulse Rate: 74 BP: (!) 120/59 Patient Position (if appropriate): Sitting Oxygen Therapy SpO2: 98 % Pulse Oximetry Type: Intermittent Pain: Pain Assessment Pain Scale: Faces Pain Score: 0-No pain  Therapy/Group: Individual Therapy  Rubylee Zamarripa OTR/L 01/06/2018, 3:10 PM

## 2018-01-07 ENCOUNTER — Inpatient Hospital Stay (HOSPITAL_COMMUNITY): Payer: Medicare Other

## 2018-01-07 NOTE — Progress Notes (Signed)
Brandy GarnetBarbara Flowers is a 77 y.o. female 01/19/1941 [id number]  Subjective: Continued concerns about discharge planning and disposition.  Feels strongly that she can manage medications for herself and should be allowed home with assistance from neighbors as needed but agreeable to short stay at skilled facility rehab if needed.  Objective: Vital signs in last 24 hours: Temp:  [98 F (36.7 C)-98.1 F (36.7 C)] 98.1 F (36.7 C) (11/23 0355) Pulse Rate:  [67-68] 68 (11/23 0355) Resp:  [16-18] 16 (11/23 0355) BP: (128-150)/(55-69) 128/69 (11/23 0355) SpO2:  [96 %-98 %] 98 % (11/23 0355) Weight change:  Last BM Date: 01/07/18  Intake/Output from previous day: 11/22 0701 - 11/23 0700 In: 490 [P.O.:490] Out: -   Physical Exam General: No apparent distress   sitting in bedside recliner Lungs: Normal effort. Lungs clear to auscultation, no crackles or wheezes. Cardiovascular: Regular rate and rhythm, no edema Neurological: No new neurological deficits   Lab Results: BMET    Component Value Date/Time   NA 136 12/30/2017 0459   K 4.0 12/30/2017 0459   CL 107 12/30/2017 0459   CO2 20 (L) 12/30/2017 0459   GLUCOSE 150 (H) 12/30/2017 0459   BUN 18 12/30/2017 0459   CREATININE 0.86 01/03/2018 0553   CALCIUM 9.3 12/30/2017 0459   GFRNONAA >60 01/03/2018 0553   GFRAA >60 01/03/2018 0553   CBC    Component Value Date/Time   WBC 7.4 12/21/2017 0423   RBC 4.82 12/21/2017 0423   HGB 13.4 12/21/2017 0423   HCT 40.3 12/21/2017 0423   PLT 293 12/21/2017 0423   MCV 83.6 12/21/2017 0423   MCH 27.8 12/21/2017 0423   MCHC 33.3 12/21/2017 0423   RDW 13.0 12/21/2017 0423   LYMPHSABS 2.5 12/21/2017 0423   MONOABS 0.8 12/21/2017 0423   EOSABS 0.1 12/21/2017 0423   BASOSABS 0.1 12/21/2017 0423   CBG's (last 3):  No results for input(s): GLUCAP in the last 72 hours. LFT's Lab Results  Component Value Date   ALT 15 12/21/2017   AST 23 12/21/2017   ALKPHOS 45 12/21/2017   BILITOT 0.8  12/21/2017    Studies/Results: No results found.  Medications:  I have reviewed the patient's current medications. Scheduled Medications: . aspirin  300 mg Rectal Daily   Or  . aspirin  325 mg Oral Daily  . atenolol  25 mg Oral Daily  . clonazepam  0.5 mg Oral BID  . clopidogrel  75 mg Oral Daily  . diclofenac sodium  2 g Topical QID  . enoxaparin (LOVENOX) injection  40 mg Subcutaneous Q24H  . ezetimibe  10 mg Oral Daily  . levothyroxine  100 mcg Oral Q0600  . losartan  12.5 mg Oral Daily  . potassium chloride  40 mEq Oral BID  . scopolamine  1 patch Transdermal Q72H  . topiramate  50 mg Oral BID   PRN Medications: acetaminophen **OR** acetaminophen (TYLENOL) oral liquid 160 mg/5 mL **OR** acetaminophen, cloNIDine, ondansetron (ZOFRAN) IV, RESOURCE THICKENUP CLEAR, senna-docusate, sorbitol, traMADol  Assessment/Plan: Active Problems:   Anxiety state   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 (HCC)   Oropharyngeal dysphagia   Labile blood pressure   Hyponatremia   Renal artery stenosis (HCC)   1.  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  Ongoing inpatient rehab as prescribed including PT, OT and ST as tolerated.  Continue medical management of chronic conditions. 2.  Dysphasia on modified diet.  Encourage oral intake and fluids. 3.  Essential hypertension.  Monitor with increased activity and titrate medications as needed. 4.  Anxiety disorder prior to admission.  Continue Klonopin as prescribed and outpatient follow-up with therapist and primary care as planned.  Ego support offered. 5.  Hypothyroidism.  Continue supplemental replacement as ordered. 6.  Hyperlipidemia on Zetia. 7.  Diet-controlled type 2 diabetes with A1c 6.5 prior to admission.  Nutritional support and education provided.   Length of stay, days: 18  Nadiyah Zeis A. Felicity Coyer, MD 01/07/2018, 1:48 PM

## 2018-01-07 NOTE — Progress Notes (Signed)
Occupational Therapy Session Note  Patient Details  Name: Brandy Flowers MRN: [id number] Date of Birth: [birth year]  Today's Date: 01/07/2018 OT Individual Time: 6294-7654 OT Individual Time Calculation (min): 41 min    Short Term Goals: Week 1:  OT Short Term Goal 1 (Week 1): Pt will be able to tolerate standing with LRAD for 2 min at a time to engage in LB self care.  OT Short Term Goal 1 - Progress (Week 1): Progressing toward goal OT Short Term Goal 2 (Week 1): Pt will wash UB with set up. OT Short Term Goal 2 - Progress (Week 1): Met OT Short Term Goal 3 (Week 1): Pt will wash LB with min A. OT Short Term Goal 3 - Progress (Week 1): Progressing toward goal OT Short Term Goal 4 (Week 1): Pt will transfer to toilet with min A.  OT Short Term Goal 4 - Progress (Week 1): Progressing toward goal OT Short Term Goal 5 (Week 1): Pt will tolerate being upright for at least 30 min to enable her to be able to shower.  OT Short Term Goal 5 - Progress (Week 1): Progressing toward goal  Skilled Therapeutic Interventions/Progress Updates:    1:1. Pt with much improved mood and participation this session than with this OT in previous sessions.  Pt supine>sitting with VC for hand placement for trunk elevation. Pt stand pivot transfer with min guard and VC for hand placement. Pt bathes at sink seated for UB , crossing BLE to wash feet. Pt dons shirt with set up. Pt dons pants with VC for hemi dressing as well as weight shift forward in standing with CGA. Pt keeps stating she wants to d/c home. Purposefully allowed pt to LOB backwards into w/c 2x in standing to attempt to bring increased awareness to balance deficits impeding pt from returning home MOD I. PT stands for grooming and seated rest break in between items for oral and hair care. Pt practicces ambualtory transfer with RW w/c>toilet>recliner with CGA and VC for controlled decent when sitting as well as han placement on RW. Exited session with belt  alram on recliner, call light in reach and MD in room rounding.  Therapy Documentation Precautions:  Precautions Precautions: Fall, Other (comment) Precaution Comments: watch BP, dizziness  Restrictions Weight Bearing Restrictions: No General:   Vital Signs:   Pain: Pain Assessment Pain Scale: 0-10 Pain Score: 6  Pain Type: Acute pain Pain Location: Neck Pain Descriptors / Indicators: Aching Pain Frequency: Intermittent Pain Onset: On-going Pain Intervention(s): Medication (See eMAR) ADL: ADL Grooming: Minimal cueing, Minimal assistance Where Assessed-Grooming: Sitting at sink Upper Body Bathing: Supervision/safety Where Assessed-Upper Body Bathing: Edge of bed Lower Body Bathing: Moderate assistance Where Assessed-Lower Body Bathing: Edge of bed Upper Body Dressing: Minimal assistance Where Assessed-Upper Body Dressing: Edge of bed Lower Body Dressing: Maximal assistance Where Assessed-Lower Body Dressing: Edge of bed Toileting: Maximal assistance Where Assessed-Toileting: Bedside Commode Toilet Transfer: Moderate assistance Toilet Transfer Method: Stand pivot Science writer: Radiographer, therapeutic: Not assessed Social research officer, government: Not assessed Vision   Perception    Praxis   Exercises:   Other Treatments:     Therapy/Group: Individual Therapy  Tonny Branch 01/07/2018, 9:27 AM

## 2018-01-08 ENCOUNTER — Inpatient Hospital Stay (HOSPITAL_COMMUNITY): Payer: Medicare Other

## 2018-01-08 MED ORDER — GABAPENTIN 100 MG PO CAPS
100.0000 mg | ORAL_CAPSULE | Freq: Every day | ORAL | Status: DC
  Administered 2018-01-08 – 2018-01-09 (×2): 100 mg via ORAL
  Filled 2018-01-08 (×2): qty 1

## 2018-01-08 NOTE — Progress Notes (Signed)
Occupational Therapy Session Note  Patient Details  Name: Fedra Lanter MRN: [id number] Date of Birth: Oct 01, 1940  Today's Date: 01/08/2018 OT Individual Time: 1410-1430 OT Individual Time Calculation (min): 20 min (make up time)    Skilled Therapeutic Interventions/Progress Updates:    1:1. Pt agreeable to make up session with encouragement from RN and RN applies muscle rub cream to RLE. Pt dons pants seated in recliner with S for threading BLE into pant and CGA for standing with VC for hand placement on RW. Pt ambulates with RW around bed to/from w/c. Pt stands to complete corn hole activity with S-CGA for posterior LOB recoved with min A and stepping strategy. Eixted session with pt seated in w/c, call light in reahc safety alarm on and all needs met  Therapy Documentation Precautions:  Precautions Precautions: Fall, Other (comment) Precaution Comments: watch BP, dizziness  Restrictions Weight Bearing Restrictions: No General:   Vital Signs:   Therapy/Group: Individual Therapy  Tonny Branch 01/08/2018, 4:38 PM

## 2018-01-08 NOTE — Plan of Care (Signed)
  Problem: RH PAIN MANAGEMENT Goal: RH STG PAIN MANAGED AT OR BELOW PT'S PAIN GOAL Description Less than 3,on 1 to 10 scale.  Outcome: Not Progressing; c/o pain at hs and early morning; new orders noted

## 2018-01-08 NOTE — Progress Notes (Signed)
Occupational Therapy Session Note  Patient Details  Name: Eugene GarnetBarbara Asmus MRN: [id number] Date of Birth: 11/28/1940  Today's Date: 01/08/2018 OT Individual Time: 1015-1110 OT Individual Time Calculation (min): 55 min    Short Term Goals: Week 3:  OT Short Term Goal 1 (Week 3): Continue working on established LTGs downgraded to supervision level overall.   Skilled Therapeutic Interventions/Progress Updates:    OT intervention with focus on bed mobility, functional transfers, sit<>stand, standing balance, BADL retraining, activity tolerance, and safety awareness to increase independence with BADLs. Upon entering room pt stated she was weak but still agreeable to participating in therapy.  Pt sat EOB with supervision and transferred to w/c with min A for squat pivot transfers.  Pt engaged in bathing/dressing with sit<>stand from w/c at sink with assistance threading RLE of incontinence brief and pants.  Pt required min A for sit<>stand and standing balance for LB bathing/dressing tasks.  Pt required more than a reasonable amount of time to complete tasks with multiple rest breaks.  Pt transferred to recliner and remained in recliner with all needs within reach and belt alarm activated. Pt continues to required min verbal cues for safety with transitional movements and mod encouragement throughout session.   Therapy Documentation Precautions:  Precautions Precautions: Fall, Other (comment) Precaution Comments: watch BP, dizziness  Restrictions Weight Bearing Restrictions: No  Pain: Pt c/o RLE and neck pain (unrated); RN aware and attended to pt  Therapy/Group: Individual Therapy  Rich BraveLanier, Amanie Mcculley Chappell 01/08/2018, 11:12 AM

## 2018-01-08 NOTE — Progress Notes (Signed)
Brandy GarnetBarbara Flowers is a 77 y.o. female 01/16/1941 [id number]  Subjective: Reports cramping sensation in the right leg, present prior to admission for which she took Mobic and gabapentin as needed.  Denies other concerns or new problems.  Objective: Vital signs in last 24 hours: Temp:  [97.8 F (36.6 C)-98 F (36.7 C)] 98 F (36.7 C) (11/24 0438) Pulse Rate:  [62-73] 62 (11/24 0438) Resp:  [16-18] 18 (11/24 0438) BP: (142-146)/(55-69) 142/69 (11/24 0438) SpO2:  [97 %-99 %] 98 % (11/24 0438) Weight change:  Last BM Date: 01/07/18  Intake/Output from previous day: 11/23 0701 - 11/24 0700 In: 720 [P.O.:720] Out: -   Physical Exam General: No apparent distress, lying supine in bed Lungs: Normal effort. Lungs clear to auscultation, no crackles or wheezes. Cardiovascular: Regular rate and rhythm, no edema Neurological: No new neurological deficits   Lab Results: BMET    Component Value Date/Time   NA 136 12/30/2017 0459   K 4.0 12/30/2017 0459   CL 107 12/30/2017 0459   CO2 20 (L) 12/30/2017 0459   GLUCOSE 150 (H) 12/30/2017 0459   BUN 18 12/30/2017 0459   CREATININE 0.86 01/03/2018 0553   CALCIUM 9.3 12/30/2017 0459   GFRNONAA >60 01/03/2018 0553   GFRAA >60 01/03/2018 0553   CBC    Component Value Date/Time   WBC 7.4 12/21/2017 0423   RBC 4.82 12/21/2017 0423   HGB 13.4 12/21/2017 0423   HCT 40.3 12/21/2017 0423   PLT 293 12/21/2017 0423   MCV 83.6 12/21/2017 0423   MCH 27.8 12/21/2017 0423   MCHC 33.3 12/21/2017 0423   RDW 13.0 12/21/2017 0423   LYMPHSABS 2.5 12/21/2017 0423   MONOABS 0.8 12/21/2017 0423   EOSABS 0.1 12/21/2017 0423   BASOSABS 0.1 12/21/2017 0423   CBG's (last 3):  No results for input(s): GLUCAP in the last 72 hours. LFT's Lab Results  Component Value Date   ALT 15 12/21/2017   AST 23 12/21/2017   ALKPHOS 45 12/21/2017   BILITOT 0.8 12/21/2017    Studies/Results: No results found.  Medications:  I have reviewed the patient's  current medications. Scheduled Medications: . aspirin  300 mg Rectal Daily   Or  . aspirin  325 mg Oral Daily  . atenolol  25 mg Oral Daily  . clonazepam  0.5 mg Oral BID  . clopidogrel  75 mg Oral Daily  . diclofenac sodium  2 g Topical QID  . enoxaparin (LOVENOX) injection  40 mg Subcutaneous Q24H  . ezetimibe  10 mg Oral Daily  . levothyroxine  100 mcg Oral Q0600  . losartan  12.5 mg Oral Daily  . potassium chloride  40 mEq Oral BID  . scopolamine  1 patch Transdermal Q72H  . topiramate  50 mg Oral BID   PRN Medications: acetaminophen **OR** acetaminophen (TYLENOL) oral liquid 160 mg/5 mL **OR** acetaminophen, cloNIDine, ondansetron (ZOFRAN) IV, RESOURCE THICKENUP CLEAR, senna-docusate, sorbitol, traMADol  Assessment/Plan: Active Problems:   Anxiety state   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 (HCC)   Oropharyngeal dysphagia   Labile blood pressure   Hyponatremia   Renal artery stenosis (HCC)   1.  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  Ongoing inpatient rehab as prescribed including PT, OT and ST as tolerated.  Continue medical management of chronic conditions. 2.  Dysphasia on modified diet.  Encourage oral intake and fluids. 3.  Essential hypertension.  Monitor with increased activity and titrate medications as needed. 4.  Anxiety disorder prior to admission.  Continue Klonopin as prescribed and outpatient follow-up with therapist and primary care as planned.  Ego support offered. 5.  Hypothyroidism.  Continue supplemental replacement as ordered. 6.  Hyperlipidemia on Zetia. 7.  Diet-controlled type 2 diabetes with A1c 6.5 prior to admission.  Nutritional support and education provided.   Length of stay, days: 19  Kimberlie Csaszar A. Felicity Coyer, MD 01/08/2018, 10:43 AM

## 2018-01-09 ENCOUNTER — Inpatient Hospital Stay (HOSPITAL_COMMUNITY): Payer: Medicare Other

## 2018-01-09 ENCOUNTER — Inpatient Hospital Stay (HOSPITAL_COMMUNITY): Payer: Medicare Other | Admitting: Speech Pathology

## 2018-01-09 ENCOUNTER — Inpatient Hospital Stay (HOSPITAL_COMMUNITY): Payer: Self-pay

## 2018-01-09 MED ORDER — TRAMADOL HCL 50 MG PO TABS: 50.0000 mg | ORAL_TABLET | Freq: Four times a day (QID) | ORAL | 0 refills | Status: DC | PRN

## 2018-01-09 MED ORDER — TOPIRAMATE 50 MG PO TABS: 50.0000 mg | ORAL_TABLET | Freq: Two times a day (BID) | ORAL | 0 refills | Status: DC

## 2018-01-09 MED ORDER — ATENOLOL 25 MG PO TABS: 25.0000 mg | ORAL_TABLET | Freq: Every day | ORAL | Status: DC

## 2018-01-09 MED ORDER — GABAPENTIN 100 MG PO CAPS: 100.0000 mg | ORAL_CAPSULE | Freq: Every day | ORAL | 0 refills | Status: DC

## 2018-01-09 MED ORDER — CLONAZEPAM 0.5 MG PO TBDP: 0.5000 mg | ORAL_TABLET | Freq: Two times a day (BID) | ORAL | 0 refills | Status: DC

## 2018-01-09 MED ORDER — LOSARTAN POTASSIUM 25 MG PO TABS: 12.5000 mg | ORAL_TABLET | Freq: Every day | ORAL | Status: DC

## 2018-01-09 MED ORDER — SCOPOLAMINE 1 MG/3DAYS TD PT72: 1.0000 | MEDICATED_PATCH | TRANSDERMAL | 0 refills | Status: DC

## 2018-01-09 MED ORDER — ACETAMINOPHEN 325 MG PO TABS: 650.0000 mg | ORAL_TABLET | ORAL | Status: DC | PRN

## 2018-01-09 NOTE — Progress Notes (Signed)
Social Work  Discharge Note  The overall goal for the admission was met for:   Discharge location: Yes-[facility] IN [place]  Length of Stay: Yes-21 DAYS  Discharge activity level: Yes-SUPERVISION-MIN ASSIST LEVEL  Home/community participation: Yes  Services provided included: MD, RD, PT, OT, SLP, RN, CM, TR, Pharmacy, Neuropsych and SW  Financial Services: Medicare and Private Insurance: [place]  Follow-up services arranged: Other: SHORT TERM NHP  Comments (or additional information):NEEDS MORE REHAB BEFORE GOING HOME.    Patient/Family verbalized understanding of follow-up arrangements: Yes  Individual responsible for coordination of the follow-up plan: SELF & CHIP-BROTHER  Confirmed correct DME delivered: Elease Hashimoto 01/09/2018    Elease Hashimoto

## 2018-01-09 NOTE — Progress Notes (Signed)
Physical Therapy Discharge Summary  Patient Details  Name: Brandy Flowers MRN: [id number] Date of Birth: [birth year]  Today's Date: 01/09/2018 PT Individual Time: 1856-3149 and 1600-1630 PT Individual Time Calculation (min): 55 min and 30 min     Patient has met 6 of 10 long term goals due to improved activity tolerance, improved balance, improved postural control, increased strength and improved awareness.  Patient to discharge at an ambulatory level [place].   Patient's care partner unavailable to provide the necessary physical assistance at discharge. Thus pt will be discharging to a skilled nursing facility as she continues to require 24/7 assist for mobility and would benefit from continued therapy at this setting.   Reasons goals not met: Pt did not meet stair navigation goal or home ambulation goal as she requires min assist for these. These goals are no longer applicable as the pt will be discharging to a SNF instead of her home.   Recommendation:  Patient will benefit from ongoing skilled PT services in skilled nursing facility setting to continue to advance safe functional mobility, address ongoing impairments in balance, strength, endurance, and minimize fall risk.  Equipment: No equipment provided  Reasons for discharge: treatment goals met and discharge from hospital  Patient/family agrees with progress made and goals achieved: Yes   PT Treatment interventions: Session 1: Pt supine in bed upon PT arrival, agreeable to therapy tx and denies pain. Pt transferred from supine>sitting EOB with supervision and use of bedrails, increased time to complete. Pt seated EOB donned pants and performed sit<>stand with supervision and RW to pull pants over hips. Pt transferred to w/c with CGA. Pt propelled w/c x 100 ft with supervision using  B UEs, increased time to complete. Pt ambulated 2 x 50 ft this session with CGA-min assist and RW, verbal cues for upright posture. Pt transported to  ortho gym and performed car transfer with RW and CGA. Pt transported back to room and transferred to recliner with CGA and RW. Pt left seated in recliner with needs in reach and chair alarm set.   Session 2: Pt supine in bed upon PT arrival, agreeable to therapy tx and denies pain. Pt transferred from supine>sitting EOB with supervision and use of bedrails, increased time to complete. Pt performed stand pivot to w/c with RW and CGA, verba cues for techniques. Pt transported to the dayroom. Pt performed stand pivot to nustep with min assist and RW. Pt used nustep working on activity tolerance and strengthening x 6 minutes on workload 4, pt reports increased pain to 10/10 in R knee. Pt transferred to w/c and transported to gym. Pt performed seated fine motor task to connect pvc pipes, pt requiring max cues to complete the puzzle correctly. Pt transported back to room and transferred to bed CGA, left supine with ice applied for pain relief.    PT Discharge Precautions/Restrictions Precautions Precautions: Fall;Other (comment) Precaution Comments: watch BP, dizziness  Restrictions Weight Bearing Restrictions: No Pain Pain Assessment Pain Scale: 0-10 Pain Score: 2  Pain Type: Acute pain Pain Location: Head Pain Orientation: Anterior Pain Intervention(s): Medication (See eMAR) Cognition Overall Cognitive Status: Impaired/Different from baseline Arousal/Alertness: Awake/alert Orientation Level: Oriented X4 Attention: Sustained Sustained Attention: Appears intact Memory: Appears intact Awareness: Impaired Sensation Sensation Light Touch: Appears Intact Proprioception: Appears Intact Additional Comments: sensation appears intact B LEs.  Coordination Gross Motor Movements are Fluid and Coordinated: No Fine Motor Movements are Fluid and Coordinated: No Coordination and Movement Description: slow movements overall Motor  Motor  Motor: Within Functional Limits Motor - Discharge  Observations: generalized weakness  Mobility Bed Mobility Bed Mobility: Rolling Right;Rolling Left;Supine to Sit;Sit to Supine Rolling Right: Supervision/verbal cueing Rolling Left: Supervision/Verbal cueing Supine to Sit: Supervision/Verbal cueing Sit to Supine: Supervision/Verbal cueing Transfers Transfers: Sit to Stand;Stand Pivot Transfers Sit to Stand: Supervision/Verbal cueing Stand Pivot Transfers: Minimal Assistance - Patient > 75% Stand Pivot Transfer Details: Verbal cues for technique;Verbal cues for precautions/safety Transfer (Assistive device): Rolling walker Locomotion  Gait Ambulation: Yes Gait Assistance: Minimal Assistance - Patient > 75% Gait Distance (Feet): 50 Feet Assistive device: Rolling walker Gait Assistance Details: Verbal cues for technique;Verbal cues for precautions/safety  Trunk/Postural Assessment  Cervical Assessment Cervical Assessment: Exceptions to WFL(forward head posture) Thoracic Assessment Thoracic Assessment: Exceptions to WFL(kyphotic) Lumbar Assessment Lumbar Assessment: Within Functional Limits Postural Control Postural Control: Deficits on evaluation(limited)  Balance Balance Balance Assessed: Yes Static Sitting Balance Static Sitting - Level of Assistance: 5: Stand by assistance Dynamic Sitting Balance Dynamic Sitting - Level of Assistance: 5: Stand by assistance Static Standing Balance Static Standing - Level of Assistance: 5: Stand by assistance Dynamic Standing Balance Dynamic Standing - Level of Assistance: 5: Stand by assistance;4: Min assist Extremity Assessment  RLE Assessment RLE Assessment: Exceptions to [facility] Passive Range of Motion (PROM) Comments: [facility] [place] General Strength Comments: 4/5 hip strength, 3-/5 hamstrings and quads, 3-/5 anterior tib LLE Assessment LLE Assessment: Within Functional Limits    Netta Corrigan, PT, DPT 01/09/2018, 1:30 PM

## 2018-01-09 NOTE — Progress Notes (Signed)
Occupational Therapy Discharge Summary  Patient Details  Name: Brandy Flowers MRN: [id number] Date of Birth: Oct 03, 1940  Patient has met 65 of 14 long term goals due to improved activity tolerance, improved balance, postural control and ability to compensate for deficits.  Pt progress was slow and inconsistent during this admission.  LTG downgraded from mod I to CGA/supervision overall during this admission.  Pt continues to fatigue quickly and requires max encouragement to actively participate in BADLs. Pt completes BADLs at CGA/supervision level with min A/CGA for functional transfers.  Pt discharging to SNF for further rehabilitation.  Family has not been present for therapy sesisonsPatient to discharge [facility].  Patient's care partner requires assistance to provide the necessary physical and cognitive assistance at discharge.    Reasons goals not met: Pt needs mod assist for emergent awareness  Recommendation:  Patient will benefit from ongoing skilled OT services in skilled nursing facility setting to continue to advance functional skills in the area of BADL and Reduce care partner burden.  Pt still needs overall min assist for safety during selfcare tasks and functional transfers.  She does not have assist at home so feel further rehab at SNF level will be the best and safest option at this time.    Equipment: No equipment provided Discharge to SNF  Reasons for discharge: treatment goals met and discharge from hospital  Patient/family agrees with progress made and goals achieved: Yes  OT Discharge Vision Baseline Vision/History: No visual deficits;Wears glasses Wears Glasses: Reading only Patient Visual Report: No change from baseline Vision Assessment?: No apparent visual deficits Perception  Perception: Within Functional Limits Praxis Praxis: Intact Cognition Overall Cognitive Status: Impaired/Different from baseline Arousal/Alertness:  Awake/alert Orientation Level: Oriented X4 Attention: Sustained Sustained Attention: Appears intact Memory: Appears intact Awareness: Impaired Awareness Impairment: Emergent impairment Problem Solving: Impaired Safety/Judgment: Impaired Sensation Sensation Light Touch: Appears Intact Hot/Cold: Appears Intact Proprioception: Appears Intact Stereognosis: Not tested Additional Comments: sensation appears intact B LEs.  Coordination Gross Motor Movements are Fluid and Coordinated: No Fine Motor Movements are Fluid and Coordinated: No Coordination and Movement Description: slow movements overall Motor  Motor Motor: Within Functional Limits Motor - Discharge Observations: generalized weakness Mobility  Bed Mobility Bed Mobility: Rolling Right;Rolling Left;Supine to Sit;Sit to Supine Rolling Right: Supervision/verbal cueing Rolling Left: Supervision/Verbal cueing Supine to Sit: Supervision/Verbal cueing Sit to Supine: Supervision/Verbal cueing Transfers Sit to Stand: Supervision/Verbal cueing  Trunk/Postural Assessment  Cervical Assessment Cervical Assessment: Exceptions to WFL(forward head) Thoracic Assessment Thoracic Assessment: Exceptions to WFL(kyphotic) Lumbar Assessment Lumbar Assessment: Within Functional Limits Postural Control Postural Control: Deficits on evaluation(limited)  Balance Balance Balance Assessed: Yes Static Sitting Balance Static Sitting - Level of Assistance: 5: Stand by assistance Dynamic Sitting Balance Dynamic Sitting - Level of Assistance: 5: Stand by assistance Static Standing Balance Static Standing - Level of Assistance: 5: Stand by assistance Dynamic Standing Balance Dynamic Standing - Level of Assistance: 5: Stand by assistance;4: Min assist Extremity/Trunk Assessment RUE Assessment RUE Assessment: Exceptions to [facility] General Strength Comments: 3+/5 overall LUE Assessment General Strength Comments: 3+/5 overall   Leroy Libman 01/09/2018, 2:29 PM

## 2018-01-09 NOTE — Progress Notes (Signed)
Social Work Patient ID: [name], female   DOB: 11/22/1940, 77 y.o.   MRN: [id number] Spoke with [facility] who can admit pt tomorrow. Have informed pt and brother-Chip to inform of this. Pt continues to vacillate back and forth. Plan for transfer tomorrow.

## 2018-01-09 NOTE — Progress Notes (Signed)
Speech Language Pathology Discharge Summary  Patient Details  Name: Brandy Flowers MRN: [id number] Date of Birth: 31-Dec-1940  Today's Date: 01/09/2018 SLP Individual Time: 0810-0900 SLP Individual Time Calculation (min): 50 min   Skilled Therapeutic Interventions: Skilled treatment session focused on cognitive goals. SLP facilitated session by providing supervision verbal cues for patient to recall her current medications and their functions. However, patient requires overall Min-Mod A verbal cues for complex problem solving and error awareness while organizing a BID pill box. Patient intermittently tearful throughout session. Patient left upright in bed with alarm on and all needs within reach. Continue with current plan of care.   Patient has met 5 of 7 long term goals.  Patient to discharge at overall Supervision level.   Reasons goals not met: Patient continues to require overall Min A verbal cues for emergent awareness and complex problem solving    Clinical Impression/Discharge Summary: Patient has made functional gains and has met 5 of 7 LTGs this admission. Currently, patient is consuming Dys. 2 textures with thin liquids with minimal overt s/s of aspiration and overall supervision verbal cues for use of swallowing compensatory strategies, especially a chin tuck with all liquids and solids. Patient is overall Mod I for use of speech intelligibility strategies and is 100% intelligible at the conversation level. Patient demonstrates improved attention and recall but continues to require overall Min A verbal cues for complex problem solving and emergent awareness of errors with functional tasks with impacts her overall safety. Patient's family is unable to provide the necessary assistance needed at this time, therefore, patient will discharge to a SNF. Patient would benefit from f/u SLP services to maximize her cognitive and swallowing function and overall functional independence prior to  discharge.   Care Partner:  Caregiver Able to Provide Assistance: No  Type of Caregiver Assistance: Physical;Cognitive  Recommendation:  Skilled Nursing facility  Rationale for SLP Follow Up: Reduce caregiver burden;Maximize cognitive function and independence;Maximize swallowing safety;Maximize functional communication   Equipment: N/A   Reasons for discharge: Discharged from hospital   Patient/Family Agrees with Progress Made and Goals Achieved: Yes    [place], [place] 01/09/2018, 3:07 PM

## 2018-01-09 NOTE — Discharge Summary (Signed)
NAMEugene Garnet: Corradi, Talley MEDICAL RECORD [id number] ACCOUNT [id number] DATE OF BIRTH:1940-04-03 FACILITY: [facility] LOCATION: [facility] PHYSICIAN:ANDREW Wynn BankerKIRSTEINS, MD  DISCHARGE SUMMARY  DATE OF DISCHARGE:  01/10/2018  DISCHARGE DIAGNOSES:  1.  Bilateral anterior pontine nonhemorrhagic infarction. 2.  Subcutaneous Lovenox for deep venous thrombosis prophylaxis.   3.  Mood. 5.  Dysphagia. 6.  Hypertension. 7.  Hyperlipidemia.   8.  Diet-controlled diabetes mellitus. 9.  Hyponatremia, resolved. 9.  Renal artery stenosis.  HOSPITAL COURSE:  This is a 77 year old right-handed female with history of hypertension, renal artery stenosis, who lives alone, independent prior to admission.  One level home, 2 steps to entry.  She has a brother who is a Engineer, civil (consulting)nurse.  Presented 12/17/2017  with slurred speech, altered mental status.  Cranial CT scan unremarkable for acute process.  MRI showed acute -subacute bilateral anterior pontine nonhemorrhagic infarction.  The patient did not receive tPA.  CT angiogram of head and neck, no  significant carotid or vertebral artery stenosis, negative for emergent large vessel occlusion.  Echocardiogram with ejection fraction 70%, grade I diastolic dysfunction, no wall motion abnormalities.  Maintained on aspirin and Plavix x3 months, then  Plavix alone.  Subcutaneous Lovenox for DVT prophylaxis.  Initially on a dysphagia #1 pudding-thick liquid diet.  The patient was admitted for comprehensive rehabilitation program.  PAST MEDICAL HISTORY:  See discharge diagnoses.  SOCIAL HISTORY:  Lives alone, independent prior to admission.  FUNCTIONAL STATUS:  Upon admission to rehab services was minimal assist 6 feet rolling walker, minimal assist sit to stand, moderate assist upper body max assist lower body ADLs.  PHYSICAL EXAMINATION: VITAL SIGNS:  Blood pressure 192/53, pulse 52, temperature 97, respirations 18. GENERAL:  Alert female.  Speech mildly dysarthric, follow commands.   Fair awareness of deficits. HEENT:  EOMs intact. NECK:  Supple, nontender, no JVD. CARDIOVASCULAR:  Rate controlled. ABDOMEN:  Soft, nontender, good bowel sounds. LUNGS:  Clear to auscultation.  Strength grossly graded 3-4/5.  REHABILITATION HOSPITAL COURSE:  The patient was admitted to inpatient rehabilitation services.  Therapies initiated on a 3-hour daily basis, consisting of physical therapy, occupational therapy, speech therapy and rehabilitation nursing.  The following  issues were addressed during patient's rehabilitation stay:    Pertaining to the patient's bilateral anterior pontine, nonhemorrhagic infarction, remained stable.  She would remain on aspirin and Plavix x3 months, then Plavix alone.  Subcutaneous Lovenox for DVT prophylaxis.  No bleeding episodes.  Her diet had been  advanced to a dysphagia #2 thin liquid.  Blood pressure is controlled on Tenormin as well as Cozaar.    Intermittent bouts of headache responding well to Topamax.    Mood stabilization with Klonopin.    Diet-controlled diabetes mellitus.  Hemoglobin A1c of 6.5.    She had some mild dizziness.  A followup scan did show some extension of the ventral pontine infarction.  No change neurologically.  Continue aspirin and Plavix therapy.  A scopolamine patch was added for nausea.    The patient received weekly collaborative interdisciplinary team conferences to discuss estimated length of stay, family teaching, any barriers to discharge.  The patient was slow but progressive gains.  Needed some encouragement at times to participate.  Due to limited advances felt skilled nursing facility was needed bed becoming available 01/10/2018.  DISCHARGE MEDICATIONS:  Included Plavix 75 mg p.o. daily, Voltaren gel 2 grams 4 times daily, Zetia 10 mg p.o. daily, Neurontin 100 mg p.o. at bedtime, aspirin 325 mg p.o. daily, Tenormin 25 mg p.o. daily, Klonopin 0.5  mg p.o. b.i.d., Plavix 75 mg p.o.  daily,  Synthroid 100 mg p.o.  daily, Cozaar 12.5 mg p.o. daily, scopolamine patch 1.5 mg, change every 72 hours.  Topamax 50 mg p.o. b.i.d., tramadol 50 mg every 6 hours as needed for pain.    DIET:  Diet was dysphagia #2 thin liquids.  FOLLOWUP:  The patient would follow up with Dr. Claudette Laws at the outpatient rehab center as directed; Dr. Roda Shutters of [facility], call for appointment.  AN/NUANCE D:01/09/2018 T:01/09/2018 JOB:[id number]

## 2018-01-09 NOTE — Discharge Summary (Signed)
Discharge summary job [phone]

## 2018-01-09 NOTE — Progress Notes (Signed)
[facility] PHYSICAL MEDICINE & REHABILITATION PROGRESS NOTE   Subjective/Complaints:  SNF bed available in am  C/o about too much therapy yesterday ROS: Denies CP, SOB, N/V/D  Objective:   No results found. No results for input(s): WBC, HGB, HCT, PLT in the last 72 hours. No results for input(s): NA, K, CL, CO2, GLUCOSE, BUN, CREATININE, CALCIUM in the last 72 hours.  Intake/Output Summary (Last 24 hours) at 01/09/2018 0922 Last data filed at 01/09/2018 0810 Gross per 24 hour  Intake 780 ml  Output -  Net 780 ml     Physical Exam: Vital Signs Blood pressure (!) 151/70, pulse 75, temperature 98 F (36.7 C), temperature source Oral, resp. rate 18, height 5\' 2"  (1.575 m), weight 58.3 kg, SpO2 97 %. Constitutional: No distress . Vital signs reviewed. HENT: Normocephalic.  Atraumatic. Eyes: EOMI. No discharge. Cardiovascular: RRR.  No JVD. Respiratory: CTA bilaterally.  Normal effort. GI: BS +. Non-distended. Musc: No evidence of right knee effusion no erythema, there is joint line tenderness medially and laterally no tenderness over the quadriceps or patellar tendons Neurologic: Alert Motor: 4/5 in bilateral deltoid, bicep, tricep, grip Right lower extremity: hip flexor 4--4/5, knee extensors 4/5, ankle dorsiflexor 4+/5, stable Left lower extremity: hip flexor 4/5, knee extensors 4-4+/5, ankle dorsiflexor 4+/5, stable Skin: Warm and dry.  Intact.  Assessment/Plan: 1. 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  Physiatrist is providing close team supervision and 24 hour management of active medical problems listed below.  Physiatrist and rehab team continue to assess barriers to discharge/monitor patient progress toward functional and medical goals  Care Tool:  Bathing  Bathing activity did not occur: Refused Body parts bathed by patient: Right arm,  Left arm, Chest, Abdomen, Front perineal area, Right upper leg, Left upper leg, Face   Body parts bathed by helper: Buttocks, Right lower leg, Left lower leg Body parts n/a: Chest, Abdomen   Bathing assist Assist Level: Minimal Assistance - Patient > 75%     Upper Body Dressing/Undressing Upper body dressing Upper body dressing/undressing activity did not occur (including orthotics): Refused What is the patient wearing?: Pull over shirt    Upper body assist Assist Level: Set up assist    Lower Body Dressing/Undressing Lower body dressing    Lower body dressing activity did not occur: Refused What is the patient wearing?: Incontinence brief, Pants     Lower body assist Assist for lower body dressing: Minimal Assistance - Patient > 75%     Toileting Toileting Toileting Activity did not occur (Clothing management and hygiene only): Safety/medical concerns(she reported not needing to void this session and reported some incontinence)  Toileting assist Assist for toileting: Minimal Assistance - Patient > 75%     Transfers Chair/bed transfer  Transfers assist  Chair/bed transfer activity did not occur: Safety/medical concerns  Chair/bed transfer assist level: Minimal Assistance - Patient > 75%     Locomotion Ambulation   Ambulation assist   Ambulation activity did not occur: Safety/medical concerns(dizziness/BP )  Assist level: Minimal Assistance - Patient > 75% Assistive device: Walker-rolling Max distance: 10'   Walk 10 feet activity   Assist  Walk 10 feet activity did not occur: Safety/medical concerns(dizziness/BP )  Assist level: Contact Guard/Touching assist Assistive device: Walker-rolling   Walk 50 feet activity   Assist Walk 50 feet with 2 turns activity did not occur: Safety/medical concerns(dizziness/BP )  Assist level: Contact  Guard/Touching assist Assistive device: Walker-rolling    Walk 150 feet activity   Assist Walk 150 feet activity  did not occur: Safety/medical concerns(dizziness/BP )  Assist level: Contact Guard/Touching assist Assistive device: Walker-rolling    Walk 10 feet on uneven surface  activity   Assist Walk 10 feet on uneven surfaces activity did not occur: Safety/medical concerns(dizziness/BP)         Wheelchair     Assist Will patient use wheelchair at discharge?: (TBD ) Type of Wheelchair: (TBD) Wheelchair activity did not occur: Safety/medical concerns(dizziness/BP)  Wheelchair assist level: Minimal Assistance - Patient > 75% Max wheelchair distance: 20    Wheelchair 50 feet with 2 turns activity    Assist    Wheelchair 50 feet with 2 turns activity did not occur: Safety/medical concerns(dizziness/BP)   Assist Level: Minimal Assistance - Patient > 75%   Wheelchair 150 feet activity     Assist Wheelchair 150 feet activity did not occur: Safety/medical concerns(dizziness/BP)          Medical Problem List and Plan: 1.Decreased functional mobility with dysarthria/dysphagiasecondary to bilateral anterior pontine nonhemorrhagic infarction 12/17/17, extension on 11/6- no new neuro deficits   Cont CIR- to SNF in am  Improvements with  scop patch to increase activity tolerance, monitor for anticholinergic side effects 2. DVT Prophylaxis/Anticoagulation: subcutaneous Lovenox. Monitor for any bleeding episodes 3. Pain Management:Tylenol as needed 4. Mood:Ativan 1 mg 3 times a day as needed, changed to Klonopin 0.25 3 times daily on 11/14, decreased to twice daily on 11/16 per patient preference 5. Neuropsych: This patientiscapable of making decisions on herown behalf. 6. Skin/Wound Care:routine skin checks 7. Fluids/Electrolytes/Nutrition:Encourage PO intake.  8.Dysphagia.   D2 thins, encourage fluid intake, 340 mL on 01/03/2018 9. Hypertension.Tenormin 25 mg daily. Monitor with increased mobility Vitals:   01/09/18 0427 01/09/18 0800  BP: (!) 146/69 (!) 151/70   Pulse: 66 75  Resp: 18   Temp: 98 F (36.7 C)   SpO2: 97%    Cozaar decreased to 12.5 and 11/17  per neuro desired range 130-160 systolic, changed parameter for clonidine    10. Hypothyroidism. Synthroid 11. Hyperlipidemia. Zetia 12.Diet-controlled diabetes mellitus. Hemoglobin A1c 6.5. CBGs discontinued  Discussed podiatry does not provide inpt services 13.  HypoK- no diuretic, likely nutritional  Resolved on  supplement     14.  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  Nausea likely due to CVA added Zofran prn  16.  Hx migraine, now controlled  used tenormin at home   Topamax increased to  50mg  bid, prn tramadol added as well 17.  Hyponatremia- resolved     18.  Renal artery stenosis  BP controlled   Continue current plan, follow-up with vascular in 6 months. 19.  Osteoarthritis of the right knee, Voltaren gel ordered LOS: 20 days A FACE TO FACE EVALUATION WAS PERFORMED  Erick Colacendrew E Kaien Pezzullo 01/09/2018, 9:22 AM

## 2018-01-09 NOTE — Progress Notes (Signed)
Occupational Therapy Session Note  Patient Details  Name: Brandy GarnetBarbara Flowers MRN: [id number] Date of Birth: 05/30/1940  Today's Date: 01/09/2018 OT Individual Time: [id number] OT Individual Time Calculation (min): 70 min    Short Term Goals: Week 3:  OT Short Term Goal 1 (Week 3): Continue working on established LTGs downgraded to supervision level overall.   Skilled Therapeutic Interventions/Progress Updates:    Pt resting in recliner upon arrival.  Pt declined bathing/dressing this morning but agreeable to participating in therapy.  OT intervention with focus on functional transfers, sit<>stand, standing balance, activity tolerance, and safety awareness to increase independence with BADLs and tolerance to participation in therapy.  Pt to d/c to SNF tomorrow.  Pt engaged in variety of standing activities at table while engaged in assembling puzzle, peg board activities, and assembling pvc pipe structure.  Pt stood X 6 (average time of 7 mins)with extended rest breaks between.  Pt c/o increase R knee discomfort with activity but able to continue after rest.  Pt completed all functional transfers with CGA and min verbal cues for safety awareness.  Pt returned to room and transferred to recliner.  Pt remained in recliner with all needs within reach and belt alarm activated.   Therapy Documentation Precautions:  Precautions Precautions: Fall, Other (comment) Precaution Comments: watch BP, dizziness  Restrictions Weight Bearing Restrictions: No Pain: Pain Assessment Pain Scale: 0-10 Pain Score: 2  Pain Type: Acute pain Pain Location: Head Pain Orientation: Anterior Pain Intervention(s): Medication (See eMAR)   Therapy/Group: Individual Therapy  Rich BraveLanier, Miryah Ralls Chappell 01/09/2018, 2:14 PM

## 2018-01-10 ENCOUNTER — Inpatient Hospital Stay (HOSPITAL_COMMUNITY): Payer: Medicare Other | Admitting: Speech Pathology

## 2018-01-10 ENCOUNTER — Inpatient Hospital Stay (HOSPITAL_COMMUNITY): Payer: Medicare Other

## 2018-01-10 LAB — CREATININE, SERUM: Creatinine, Ser: 0.85 mg/dL (ref 0.44–1.00)

## 2018-01-10 NOTE — Progress Notes (Signed)
Report was call to [place]ica was the person that took the report.

## 2018-01-10 NOTE — Progress Notes (Signed)
[facility]IC[facility]ILIT[facility] PROGRESS NOTE   Subjective/Complaints:  Right knee pain , was practicing steps yesterday, pt gives prior hx of this problem ROS: Denies CP, SOB, N/V/D  Objective:   No results found. No results for input(s): WBC, HGB, HCT, PLT in the last 72 hours. Recent Labs    01/10/18 0737  CREATININE 0.85    Intake/Output Summary (Last 24 hours) at 01/10/2018 0828 Last data filed at 01/10/2018 0752 Gross per 24 hour  Intake 600 ml  Output -  Net 600 ml     Physical Exam: Vital Signs Blood pressure 137/63, pulse 70, temperature 97.6 F (36.4 C), temperature source Oral, resp. rate 18, height 5\' 2"  (1.575 m), weight 58.3 kg, SpO2 98 %. Constitutional: No distress . Vital signs reviewed. HENT: Normocephalic.  Atraumatic. Eyes: EOMI. No discharge. Cardiovascular: RRR.  No JVD. Respiratory: CTA bilaterally.  Normal effort. GI: BS +. Non-distended. Musc: No evidence of right knee effusion no erythema, there is joint line tenderness medially and laterally mild tenderness  patellar tendon Neurologic: Alert Motor: 4/5 in bilateral deltoid, bicep, tricep, grip Right lower extremity: hip flexor 4--4/5, knee extensors 4/5, ankle dorsiflexor 4+/5, stable Left lower extremity: hip flexor 4/5, knee extensors 4-4+/5, ankle dorsiflexor 4+/5, stable Skin: Warm and dry.  Intact.  Assessment/Plan:  1. 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  Stable for D/C today to SNF  F/u PCP in 3-4 weeks F/u PM&R 2-3 weeks See D/C summary  See D/C instructions  Care Tool:  Bathing  Bathing activity did not occur: Refused Body parts bathed by patient: Right arm, Left arm, Chest, Abdomen, Front perineal area, Right upper leg, Left upper leg, Face   Body parts bathed by helper: Buttocks, Right lower leg, Left lower leg Body parts n/a: Chest, Abdomen   Bathing assist Assist Level: Minimal Assistance - Patient > 75%     Upper Body  Dressing/Undressing Upper body dressing Upper body dressing/undressing activity did not occur (including orthotics): Refused What is the patient wearing?: Pull over shirt    Upper body assist Assist Level: Set up assist    Lower Body Dressing/Undressing Lower body dressing    Lower body dressing activity did not occur: Refused What is the patient wearing?: Underwear/pull up, Pants     Lower body assist Assist for lower body dressing: Minimal Assistance - Patient > 75%     Toileting Toileting Toileting Activity did not occur Press photographer and hygiene only): Safety/medical concerns(she reported not needing to void this session and reported some incontinence)  Toileting assist Assist for toileting: Contact Guard/Touching assist     Transfers Chair/bed transfer  Transfers assist  Chair/bed transfer activity did not occur: Safety/medical concerns  Chair/bed transfer assist level: Contact Guard/Touching assist     Locomotion Ambulation   Ambulation assist   Ambulation activity did not occur: Safety/medical concerns(dizziness/BP )  Assist level: Minimal Assistance - Patient > 75% Assistive device: Walker-rolling Max distance: 50 ft   Walk 10 feet activity   Assist  Walk 10 feet activity did not occur: Safety/medical concerns(dizziness/BP )  Assist level: Minimal Assistance - Patient > 75% Assistive device: Walker-rolling   Walk 50 feet activity   Assist Walk 50 feet with 2 turns activity did not occur: Safety/medical concerns(dizziness/BP )  Assist level: Minimal Assistance - Patient > 75% Assistive device: Walker-rolling    Walk 150 feet activity   Assist Walk 150 feet activity did not occur: Safety/medical concerns(faituge/pain)  Assist level: Contact Guard/Touching assist  Assistive device: Walker-rolling    Walk 10 feet on uneven surface  activity   Assist Walk 10 feet on uneven surfaces activity did not occur: Safety/medical  concerns         Wheelchair     Assist Will patient use wheelchair at discharge?: Yes Type of Wheelchair: Manual Wheelchair activity did not occur: Safety/medical concerns(dizziness/BP)  Wheelchair assist level: Supervision/Verbal cueing Max wheelchair distance: 100 ft    Wheelchair 50 feet with 2 turns activity    Assist    Wheelchair 50 feet with 2 turns activity did not occur: Safety/medical concerns(dizziness/BP)   Assist Level: Supervision/Verbal cueing   Wheelchair 150 feet activity     Assist Wheelchair 150 feet activity did not occur: Safety/medical concerns          Medical Problem List and Plan: 1.Decreased functional mobility with dysarthria/dysphagiasecondary to 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 DVT Prophylaxis/Anticoagulation: subcutaneous Lovenox. Monitor for any bleeding episodes 3. Pain Management:Tylenol as needed, chronic r knee pain cont diclofenac gel, and prn ice 4. Mood:Ativan 1 mg 3 times a day as needed, changed to Klonopin 0.25 3 times daily on 11/14, decreased to twice daily on 11/16 per patient preference 5. Neuropsych: This patientiscapable of making decisions on herown behalf. 6. Skin/Wound Care:routine skin checks 7. Fluids/Electrolytes/Nutrition:Encourage PO intake.  8.Dysphagia.   D2 thins, encourage fluid intake, 340 mL on 01/03/2018 9. Hypertension.Tenormin 25 mg daily. Monitor with increased mobility Vitals:   01/09/18 1917 01/10/18 0410  BP: 135/60 137/63  Pulse: 72 70  Resp: 19 18  Temp: 97.7 F (36.5 C) 97.6 F (36.4 C)  SpO2: 99% 98%   Cozaar decreased to 12.5 and 11/17  per neuro desired range 130-160 systolic, changed parameter for clonidine    10. Hypothyroidism. Synthroid 11. Hyperlipidemia.  Zetia 12.Diet-controlled diabetes mellitus. Hemoglobin A1c 6.5. CBGs discontinued  Discussed podiatry does not provide inpt services 13.  HypoK- no diuretic, likely nutritional  Resolved on  supplement     14.  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  Nausea likely due to CVA added Zofran prn  16.  Hx migraine, now controlled  used tenormin at home   Topamax increased to  50mg  bid, prn tramadol added as well 17.  Hyponatremia- resolved     18.  Renal artery stenosis  BP controlled   Continue current plan, follow-up with vascular in 6 months. 19.  Osteoarthritis of the right knee, Voltaren gel ordered LOS: 21 days A FACE TO FACE EVALUATION WAS PERFORMED  Erick Colacendrew E Kirsteins 01/10/2018, 8:28 AM

## 2018-01-26 ENCOUNTER — Encounter (HOSPITAL_COMMUNITY): Payer: Medicare Other

## 2018-01-26 ENCOUNTER — Ambulatory Visit: Payer: Medicare Other | Admitting: Vascular Surgery

## 2018-02-20 ENCOUNTER — Inpatient Hospital Stay: Payer: Medicare Other | Admitting: Physical Medicine & Rehabilitation

## 2018-02-23 ENCOUNTER — Ambulatory Visit: Payer: Medicare Other | Admitting: Sports Medicine

## 2018-03-03 ENCOUNTER — Ambulatory Visit: Payer: Medicare Other | Admitting: Adult Health

## 2018-03-23 ENCOUNTER — Ambulatory Visit (INDEPENDENT_AMBULATORY_CARE_PROVIDER_SITE_OTHER): Payer: Medicare Other | Admitting: Sports Medicine

## 2018-03-23 ENCOUNTER — Encounter: Payer: Self-pay | Admitting: Sports Medicine

## 2018-03-23 VITALS — BP 135/64 | HR 70 | Resp 16

## 2018-03-23 DIAGNOSIS — E1140 Type 2 diabetes mellitus with diabetic neuropathy, unspecified: Secondary | ICD-10-CM

## 2018-03-23 DIAGNOSIS — Z8673 Personal history of transient ischemic attack (TIA), and cerebral infarction without residual deficits: Secondary | ICD-10-CM

## 2018-03-23 DIAGNOSIS — M79676 Pain in unspecified toe(s): Secondary | ICD-10-CM | POA: Diagnosis not present

## 2018-03-23 DIAGNOSIS — Z7901 Long term (current) use of anticoagulants: Secondary | ICD-10-CM

## 2018-03-23 DIAGNOSIS — B351 Tinea unguium: Secondary | ICD-10-CM | POA: Diagnosis not present

## 2018-03-23 NOTE — Progress Notes (Signed)
Subjective: Brandy Flowers is a 78 y.o. female patient with history of diabetes, not on any medications, who presents to office today complaining of long, painful nails while ambulating in shoes; unable to trim. Patient states that the glucose reading this morning was 120 and saw PCP 3 weeks ago with last A1c recorded at 6.8.  Patient admits that she had a stroke and was admitted to call for 21 days and then after that went to rehab and is currently getting home PT.  Patient will bring medication list next office visit.  Patient Active Problem List   Diagnosis Date Noted  . Renal artery stenosis ([place])   . Hyponatremia   . Labile blood pressure   . 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 Oropharyngeal dysphagia   . Slurred speech   . Diastolic dysfunction   . Dysphagia, post-stroke   . Hypokalemia   . Stroke (cerebrum) ([place]) 12/17/2017  . Anxiety state 12/17/2017  . 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 Left renal artery stenosis ([place]) 12/05/2014  . 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 Hypertension 09/20/2013  . Hypothyroid 09/20/2013   Current Outpatient Medications on File Prior to Visit  Medication Sig Dispense Refill  . acetaminophen (TYLENOL) 325 MG tablet Take 2 tablets (650 mg total) by mouth every 4 (four) hours as needed for mild pain (or temp > 37.5 C (99.5 F)).    Marland Kitchen aspirin 325 MG tablet Take 1 tablet (325 mg total) by mouth daily.    Marland Kitchen atenolol (TENORMIN) 25 MG tablet Take 1 tablet (25 mg total) by mouth daily.    . clonazePAM (KLONOPIN) 0.5 MG disintegrating tablet Take 1 tablet (0.5 mg total) by mouth 2 (two) times daily. 6 tablet 0  . clopidogrel (PLAVIX) 75 MG tablet Take 1 tablet (75 mg total) by mouth daily.    Marland Kitchen ezetimibe (ZETIA) 10 MG tablet Take 1 tablet (10 mg total) by mouth daily.    Marland Kitchen gabapentin (NEURONTIN) 100 MG capsule Take 1 capsule (100 mg total) by mouth at bedtime. 3 capsule 0  . levothyroxine  (SYNTHROID, LEVOTHROID) 50 MCG tablet Take 100 mcg by mouth daily before breakfast.     . losartan (COZAAR) 25 MG tablet Take 0.5 tablets (12.5 mg total) by mouth daily.    Marland Kitchen scopolamine (TRANSDERM-SCOP) 1 MG/3DAYS Place 1 patch (1.5 mg total) onto the skin every 3 (three) days. 3 patch 0  . topiramate (TOPAMAX) 50 MG tablet Take 1 tablet (50 mg total) by mouth 2 (two) times daily. 6 tablet 0  . traMADol (ULTRAM) 50 MG tablet Take 1 tablet (50 mg total) by mouth every 6 (six) hours as needed for moderate pain. 3 tablet 0   No current facility-administered medications on file prior to visit.    Allergies  Allergen Reactions  . Blue Dyes (Parenteral) Shortness Of Breath and Rash  . Sulfa Antibiotics Anaphylaxis  . Ace Inhibitors Nausea And Vomiting  . Norvasc [Amlodipine] Nausea And Vomiting  . Calcium Channel Blockers Diarrhea    Recent Results (from the past 2160 hour(s))  Creatinine, serum     Status: None   Collection Time: 12/27/17  5:09 AM  Result Value Ref Range   Creatinine, Ser 0.75 0.44 - 1.00 mg/dL   GFR calc non Af Amer >60 >60 mL/min   GFR calc Af Amer >60 >60 mL/min    Comment: (NOTE) The eGFR has been calculated using the CKD EPI equation. This calculation has not  been validated in all clinical situations. eGFR's persistently <60 mL/min signify possible Chronic Kidney Disease. Performed at [facility], [place] [address], [place], [place] [address]   Basic metabolic panel     Status: Abnormal   Collection Time: 12/28/17  4:49 AM  Result Value Ref Range   Sodium 133 (L) 135 - 145 mmol/L   Potassium 4.3 3.5 - 5.1 mmol/L   Chloride 108 98 - 111 mmol/L   CO2 20 (L) 22 - 32 mmol/L   Glucose, Bld 127 (H) 70 - 99 mg/dL   BUN 12 8 - 23 mg/dL   Creatinine, Ser 0.79 0.44 - 1.00 mg/dL   Calcium 9.1 8.9 - 10.3 mg/dL   GFR calc non Af Amer >60 >60 mL/min   GFR calc Af Amer >60 >60 mL/min    Comment: (NOTE) The eGFR has been calculated using the CKD EPI equation. This  calculation has not been validated in all clinical situations. eGFR's persistently <60 mL/min signify possible Chronic Kidney Disease.    Anion gap 5 5 - 15    Comment: Performed at [facility] [address], [place], [place] [address]  Basic metabolic panel     Status: Abnormal   Collection Time: 12/30/17  4:59 AM  Result Value Ref Range   Sodium 136 135 - 145 mmol/L   Potassium 4.0 3.5 - 5.1 mmol/L   Chloride 107 98 - 111 mmol/L   CO2 20 (L) 22 - 32 mmol/L   Glucose, Bld 150 (H) 70 - 99 mg/dL   BUN 18 8 - 23 mg/dL   Creatinine, Ser 0.97 0.44 - 1.00 mg/dL   Calcium 9.3 8.9 - 10.3 mg/dL   GFR calc non Af Amer 55 (L) >60 mL/min   GFR calc Af Amer >60 >60 mL/min    Comment: (NOTE) The eGFR has been calculated using the CKD EPI equation. This calculation has not been validated in all clinical situations. eGFR's persistently <60 mL/min signify possible Chronic Kidney Disease.    Anion gap 9 5 - 15    Comment: Performed at [facility] [address] [place], [place] [address]  Creatinine, serum     Status: None   Collection Time: 01/03/18  5:53 AM  Result Value Ref Range   Creatinine, Ser 0.86 0.44 - 1.00 mg/dL   GFR calc non Af Amer >60 >60 mL/min   GFR calc Af Amer >60 >60 mL/min    Comment: (NOTE) The eGFR has been calculated using the CKD EPI equation. This calculation has not been validated in all clinical situations. eGFR's persistently <60 mL/min signify possible Chronic Kidney Disease. Performed at [facility], [place] [address], [place], [place] [address]   Creatinine, serum     Status: None   Collection Time: 01/10/18  7:37 AM  Result Value Ref Range   Creatinine, Ser 0.85 0.44 - 1.00 mg/dL   GFR calc non Af Amer >60 >60 mL/min   GFR calc Af Amer >60 >60 mL/min    Comment: Performed at [facility] [address], [place],  [address]    Objective: General: Patient is awake, alert and in no acute distress.  Using rolling  walker.  Integument: Skin is warm, dry and supple bilateral. Nails are tender, long, thickened and dystrophic with subungual debris, consistent with onychomycosis, 1-5 bilateral. No signs of infection. No open lesions or preulcerative lesions present bilateral. Small pinch callus at distal tuft of left  4th toe with no signs of infection. Remaining integument unremarkable.  Vasculature:  Dorsalis Pedis pulse 1/4 bilateral. Posterior Tibial pulse  1/4 bilateral. Capillary fill time <3 sec 1-5 bilateral. Positive hair growth to the level of the digits.Temperature gradient within normal limits. No varicosities present bilateral. No edema present bilateral.   Neurology: The patient has intact sensation measured with a 5.07/10g Semmes Weinstein Monofilament at all pedal sites bilateral . Vibratory sensation diminished bilateral with tuning fork. No Babinski sign present bilateral.   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 Muscular strength 5/5 in all lower extremity muscular groups bilateral without pain on range of motion . No tenderness with calf compression bilateral.  Assessment and Plan: Problem List Items Addressed This Visit      Endocrine   Type 2 diabetes, controlled, with neuropathy ([place])    Other Visit Diagnoses    Pain due to onychomycosis of toenail    -  Primary   Anticoagulant long-term use       History of stroke         -Examined patient. -Discussed and educated patient on diabetic foot care, especially with  regards to the vascular, neurological and musculoskeletal systems.  Advised patient to continue with close primary care and neurology follow-up after stroke -Continue with rolling walker for stability in gait -Mechanically debrided all nails 1-5 bilateral using sterile nail nipper and filed with dremel without incident  -Smoothed callus using bur at tip of left 4th toe without incident  -Patient to return  in 8 days for at risk  foot care -Patient advised to call the office if any problems or questions arise in the meantime.  Landis Martins, DPM

## 2018-05-14 ENCOUNTER — Other Ambulatory Visit: Payer: Self-pay

## 2018-05-14 ENCOUNTER — Encounter (HOSPITAL_COMMUNITY): Payer: Self-pay | Admitting: Emergency Medicine

## 2018-05-14 ENCOUNTER — Emergency Department (HOSPITAL_COMMUNITY)
Admission: EM | Admit: 2018-05-14 | Discharge: 2018-05-14 | Disposition: A | Discharge status: Home / Self Care | Payer: Medicare Other | Attending: Emergency Medicine | Admitting: Emergency Medicine

## 2018-05-14 ENCOUNTER — Emergency Department (HOSPITAL_COMMUNITY): Payer: Medicare Other

## 2018-05-14 DIAGNOSIS — R519 Headache, unspecified: Secondary | ICD-10-CM

## 2018-05-14 DIAGNOSIS — R51 Headache: Secondary | ICD-10-CM | POA: Diagnosis not present

## 2018-05-14 DIAGNOSIS — E039 Hypothyroidism, unspecified: Secondary | ICD-10-CM | POA: Insufficient documentation

## 2018-05-14 DIAGNOSIS — Z7982 Long term (current) use of aspirin: Secondary | ICD-10-CM | POA: Insufficient documentation

## 2018-05-14 DIAGNOSIS — E1140 Type 2 diabetes mellitus with diabetic neuropathy, unspecified: Secondary | ICD-10-CM | POA: Insufficient documentation

## 2018-05-14 DIAGNOSIS — Z7902 Long term (current) use of antithrombotics/antiplatelets: Secondary | ICD-10-CM | POA: Insufficient documentation

## 2018-05-14 DIAGNOSIS — I5032 Chronic diastolic (congestive) heart failure: Secondary | ICD-10-CM | POA: Insufficient documentation

## 2018-05-14 DIAGNOSIS — I110 Hypertensive heart disease with heart failure: Secondary | ICD-10-CM | POA: Insufficient documentation

## 2018-05-14 DIAGNOSIS — Z79899 Other long term (current) drug therapy: Secondary | ICD-10-CM | POA: Insufficient documentation

## 2018-05-14 DIAGNOSIS — E876 Hypokalemia: Secondary | ICD-10-CM | POA: Insufficient documentation

## 2018-05-14 DIAGNOSIS — Z8673 Personal history of transient ischemic attack (TIA), and cerebral infarction without residual deficits: Secondary | ICD-10-CM | POA: Diagnosis not present

## 2018-05-14 LAB — CBC WITH DIFFERENTIAL/PLATELET
ABS IMMATURE GRANULOCYTES: 0.02 10*3/uL (ref 0.00–0.07)
BASOS ABS: 0.1 10*3/uL (ref 0.0–0.1)
Basophils Relative: 1 %
EOS PCT: 2 %
Eosinophils Absolute: 0.1 10*3/uL (ref 0.0–0.5)
HEMATOCRIT: 41.9 % (ref 36.0–46.0)
Hemoglobin: 13.9 g/dL (ref 12.0–15.0)
IMMATURE GRANULOCYTES: 0 %
LYMPHS ABS: 2.1 10*3/uL (ref 0.7–4.0)
LYMPHS PCT: 31 %
MCH: 27.7 pg (ref 26.0–34.0)
MCHC: 33.2 g/dL (ref 30.0–36.0)
MCV: 83.5 fL (ref 80.0–100.0)
Monocytes Absolute: 0.9 10*3/uL (ref 0.1–1.0)
Monocytes Relative: 14 %
NEUTROS ABS: 3.5 10*3/uL (ref 1.7–7.7)
NRBC: 0 % (ref 0.0–0.2)
Neutrophils Relative %: 52 %
Platelets: 240 10*3/uL (ref 150–400)
RBC: 5.02 MIL/uL (ref 3.87–5.11)
RDW: 13.2 % (ref 11.5–15.5)
WBC: 6.7 10*3/uL (ref 4.0–10.5)

## 2018-05-14 LAB — COMPREHENSIVE METABOLIC PANEL
ALBUMIN: 3.6 g/dL (ref 3.5–5.0)
ALT: 45 U/L — ABNORMAL HIGH (ref 0–44)
ANION GAP: 9 (ref 5–15)
AST: 41 U/L (ref 15–41)
Alkaline Phosphatase: 49 U/L (ref 38–126)
BUN: 25 mg/dL — ABNORMAL HIGH (ref 8–23)
CHLORIDE: 109 mmol/L (ref 98–111)
CO2: 20 mmol/L — AB (ref 22–32)
Calcium: 9.4 mg/dL (ref 8.9–10.3)
Creatinine, Ser: 0.71 mg/dL (ref 0.44–1.00)
GFR calc Af Amer: >60 mL/min (ref >60)
GFR calc non Af Amer: >60 mL/min (ref >60)
GLUCOSE: 96 mg/dL (ref 70–99)
Potassium: 2.9 mmol/L — ABNORMAL LOW (ref 3.5–5.1)
Sodium: 138 mmol/L (ref 135–145)
Total Bilirubin: 0.8 mg/dL (ref 0.3–1.2)
Total Protein: 6.3 g/dL — ABNORMAL LOW (ref 6.5–8.1)

## 2018-05-14 LAB — MAGNESIUM: MAGNESIUM: 1.9 mg/dL (ref 1.7–2.4)

## 2018-05-14 MED ORDER — POTASSIUM CHLORIDE CRYS ER 20 MEQ PO TBCR
40.0000 meq | EXTENDED_RELEASE_TABLET | Freq: Once | ORAL | Status: AC
  Administered 2018-05-14: 40 meq via ORAL
  Filled 2018-05-14: qty 2

## 2018-05-14 NOTE — ED Notes (Signed)
John Decker,patient's care taker left his number for contact [phone]

## 2018-05-14 NOTE — ED Provider Notes (Signed)
[facility] EMERGENCY DEPARTMENT Provider Note   CSN: [id number] Arrival date & time: 05/14/18  8177    History   Chief Complaint Chief Complaint  Patient presents with  . Headache    HPI Brandy Flowers is a 78 y.o. female.     HPI  78 year old female presents with headache.  Headache started when she woke up at 6 AM.  She states she felt a little bit dizzy like she was a little off balance.  However she was able to walk with a cane.  The headache lasted about an hour or hour and a half and then spontaneously resolved.  The headache was bitemporal in between she did take her blood pressure medicine because she checked her blood pressure and it was 170 systolic.  This included multiple meds and she also took the 325 mg aspirin and her Ativan.  She states that 2 weeks ago she was taken off of Plavix by her PCP due to concern for abnormal LFTs and switched to the full dose aspirin.  She did not have any blurry or double vision, weakness or numbness in the extremities.  No vomiting.  Has some chronic neck pain but no new neck pain or stiffness.   Past Medical History:  Diagnosis Date  . Anxiety   . Arthritis   . Diabetes mellitus without complication ([facility])   . Hypertension   . Sinus problem   . Thyroid disease     Patient Active Problem List   Diagnosis Date Noted  . Renal artery stenosis ([facility])   . Hyponatremia   . Labile blood pressure   . 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 Oropharyngeal dysphagia   . Slurred speech   . Diastolic dysfunction   . Dysphagia, post-stroke   . Hypokalemia   . Stroke (cerebrum) ([facility]) 12/17/2017  . Anxiety state 12/17/2017  . 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 Left renal artery stenosis ([facility]) 12/05/2014  . 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 Hypertension 09/20/2013  . Hypothyroid 09/20/2013    Past Surgical History:  Procedure Laterality Date  . BREAST SURGERY     . EYE SURGERY       OB History   No obstetric history on file.      Home Medications    Prior to Admission medications   Medication Sig Start Date End Date Taking? Authorizing Provider  acetaminophen (TYLENOL) 325 MG tablet Take 2 tablets (650 mg total) by mouth every 4 (four) hours as needed for mild pain (or temp > 37.5 C (99.5 F)). 01/09/18   Angiulli, Mcarthur Rossetti, PA-C  aspirin 325 MG tablet Take 1 tablet (325 mg total) by mouth daily. 12/21/17   Zannie Cove, MD  atenolol (TENORMIN) 25 MG tablet Take 1 tablet (25 mg total) by mouth daily. 01/10/18   Angiulli, Mcarthur Rossetti, PA-C  clonazePAM (KLONOPIN) 0.5 MG disintegrating tablet Take 1 tablet (0.5 mg total) by mouth 2 (two) times daily. 01/09/18   Angiulli, Mcarthur Rossetti, PA-C  clopidogrel (PLAVIX) 75 MG tablet Take 1 tablet (75 mg total) by mouth daily. 12/21/17   Zannie Cove, MD  ezetimibe (ZETIA) 10 MG tablet Take 1 tablet (10 mg total) by mouth daily. 12/21/17   Zannie Cove, MD  gabapentin (NEURONTIN) 100 MG capsule Take 1 capsule (100 mg total) by mouth at bedtime. 01/09/18   Angiulli, Mcarthur Rossetti, PA-C  levothyroxine (SYNTHROID, LEVOTHROID) 50 MCG tablet Take 100 mcg by mouth daily before  breakfast.     [provider]  losartan (COZAAR) 25 MG tablet Take 0.5 tablets (12.5 mg total) by mouth daily. 01/10/18   Angiulli, Mcarthur Rossetti, PA-C  scopolamine (TRANSDERM-SCOP) 1 MG/3DAYS Place 1 patch (1.5 mg total) onto the skin every 3 (three) days. 01/12/18   Angiulli, Mcarthur Rossetti, PA-C  topiramate (TOPAMAX) 50 MG tablet Take 1 tablet (50 mg total) by mouth 2 (two) times daily. 01/09/18   Angiulli, Mcarthur Rossetti, PA-C  traMADol (ULTRAM) 50 MG tablet Take 1 tablet (50 mg total) by mouth every 6 (six) hours as needed for moderate pain. 01/09/18   Angiulli, Mcarthur Rossetti, PA-C    Family History Family History  Problem Relation Age of Onset  . Stroke Mother   . Diabetes Mother   . Heart disease Mother        Before age 9  . Hypertension  Mother   . Hyperlipidemia Mother   . Heart disease Father   . Heart attack Father   . Heart disease Brother        After age 95- CABG  . Hypertension Brother     Social History Social History   Tobacco Use  . Smoking status: Never Smoker  . Smokeless tobacco: Never Used  Substance Use Topics  . Alcohol use: No  . Drug use: No     Allergies   Blue dyes (parenteral); Sulfa antibiotics; Ace inhibitors; Norvasc [amlodipine]; and Calcium channel blockers   Review of Systems Review of Systems  Eyes: Negative for visual disturbance.  Gastrointestinal: Negative for vomiting.  Musculoskeletal: Negative for neck pain.  Neurological: Positive for dizziness and headaches. Negative for weakness and numbness.  All other systems reviewed and are negative.    Physical Exam Updated Vital Signs BP (!) 147/93   Pulse 60   Temp 98.3 F (36.8 C) (Oral)   Resp 14   SpO2 99%   Physical Exam Vitals signs and nursing note reviewed.  Constitutional:      General: She is not in acute distress.    Appearance: She is well-developed. She is not ill-appearing or diaphoretic.  HENT:     Head: Normocephalic and atraumatic.     Right Ear: External ear normal.     Left Ear: External ear normal.     Nose: Nose normal.  Eyes:     General:        Right eye: No discharge.        Left eye: No discharge.     Extraocular Movements: Extraocular movements intact.     Pupils: Pupils are equal, round, and reactive to light.  Neck:     Musculoskeletal: Normal range of motion and neck supple. No neck rigidity.  Cardiovascular:     Rate and Rhythm: Normal rate and regular rhythm.     Heart sounds: Normal heart sounds.  Pulmonary:     Effort: Pulmonary effort is normal.     Breath sounds: Normal breath sounds.  Abdominal:     Palpations: Abdomen is soft.     Tenderness: There is no abdominal tenderness.  Skin:    General: Skin is warm and dry.  Neurological:     Mental Status: She is alert.      Comments: CN 3-12 grossly intact. 5/5 strength in all 4 extremities. Grossly normal sensation. Normal finger to nose.   Psychiatric:        Mood and Affect: Mood is not anxious.      ED Treatments / Results  Labs (all labs ordered are listed, but only abnormal results are displayed) Labs Reviewed  COMPREHENSIVE METABOLIC PANEL - Abnormal; Notable for the following components:      Result Value   Potassium 2.9 (*)    CO2 20 (*)    BUN 25 (*)    Total Protein 6.3 (*)    ALT 45 (*)    All other components within normal limits  CBC WITH DIFFERENTIAL/PLATELET  MAGNESIUM    EKG EKG Interpretation  Date/Time:  Sunday May 14 2018 09:31:31 EDT Ventricular Rate:  60 PR Interval:    QRS Duration: 86 QT Interval:  425 QTC Calculation: 425 R Axis:   31 Text Interpretation:  Sinus rhythm Anteroseptal infarct, old no acute ST/T change no significant change since Nov 2019 Confirmed by Pricilla Loveless [phone]) on 05/14/2018 9:43:13 AM   Radiology Ct Head Wo Contrast  Result Date: 05/14/2018 CLINICAL DATA:  Headache. EXAM: CT HEAD WITHOUT CONTRAST TECHNIQUE: Contiguous axial images were obtained from the base of the skull through the vertex without intravenous contrast. COMPARISON:  MRI of December 21, 2017.  CT scan of December 17, 2017. FINDINGS: Brain: Mild diffuse cortical atrophy is noted. No mass effect or midline shift is noted. Ventricular size is within normal limits. There is no evidence of mass lesion, hemorrhage or acute infarction. Vascular: No hyperdense vessel or unexpected calcification. Skull: Normal. Negative for fracture or focal lesion. Sinuses/Orbits: Minimal left maxillary mucosal thickening is noted. Other: None. IMPRESSION: Mild diffuse cortical atrophy. No acute intracranial abnormality seen. Electronically Signed   By: Lupita Raider, M.D.   On: 05/14/2018 10:24    Procedures Procedures (including critical care time)  Medications Ordered in ED Medications   potassium chloride SA (K-DUR,KLOR-CON) CR tablet 40 mEq (40 mEq Oral Given 05/14/18 1240)     Initial Impression / Assessment and Plan / ED Course  I have reviewed the triage vital signs and the nursing notes.  Pertinent labs & imaging results that were available during my care of the patient were reviewed by me and considered in my medical decision making (see chart for details).        Unclear what caused the patient's headache but it is gone and has not come back.  Her labs are reassuring besides hypokalemia.  When telling her about this, she endorses she has known hypokalemia and is supposed to be on twice daily potassium but she has not taken this for a few days if not longer.  I discussed the importance of taking this potassium.  She will be given a dose now and she has some at home and does not need prescription.  Magnesium is benign.  ECG without QTC prolongation.  The head CT was negative and given the head CT was obtained within 6 hours of sudden onset headache I doubt need for LP as I do not think this is subarachnoid hemorrhage.  She was a little dizzy but not so bad she could not walk with a benign neuro exam I think TIA/cerebellar stroke is pretty unlikely.  She appears stable for discharge home to follow-up with PCP.  Final Clinical Impressions(s) / ED Diagnoses   Final diagnoses:  Temporal headache  Hypokalemia    ED Discharge Orders    None       Pricilla Loveless, MD 05/14/18 1324

## 2018-05-14 NOTE — Discharge Instructions (Signed)
It is important to take your potassium as instructed as your potassium was moderately low today.  If you develop continued, recurrent, or worsening headache, fever, neck stiffness, vomiting, blurry or double vision, weakness or numbness in your arms or legs, trouble speaking, or any other new/concerning symptoms then return to the ER for evaluation.

## 2018-05-14 NOTE — ED Triage Notes (Signed)
BIB [facility] from home, lives alone, c/o headache-- took medicine and now has  No complaints.  States that she has taken an ativan, and asa, and bp  meds now feels better

## 2018-06-14 ENCOUNTER — Other Ambulatory Visit: Payer: Self-pay

## 2018-06-14 DIAGNOSIS — I701 Atherosclerosis of renal artery: Secondary | ICD-10-CM

## 2018-06-23 ENCOUNTER — Ambulatory Visit (INDEPENDENT_AMBULATORY_CARE_PROVIDER_SITE_OTHER): Payer: Medicare Other | Admitting: Sports Medicine

## 2018-06-23 ENCOUNTER — Encounter: Payer: Self-pay | Admitting: Sports Medicine

## 2018-06-23 ENCOUNTER — Other Ambulatory Visit: Payer: Self-pay

## 2018-06-23 VITALS — Temp 97.8°F

## 2018-06-23 DIAGNOSIS — Z8673 Personal history of transient ischemic attack (TIA), and cerebral infarction without residual deficits: Secondary | ICD-10-CM

## 2018-06-23 DIAGNOSIS — Z7901 Long term (current) use of anticoagulants: Secondary | ICD-10-CM

## 2018-06-23 DIAGNOSIS — B351 Tinea unguium: Secondary | ICD-10-CM | POA: Diagnosis not present

## 2018-06-23 DIAGNOSIS — M79676 Pain in unspecified toe(s): Secondary | ICD-10-CM

## 2018-06-23 DIAGNOSIS — E1140 Type 2 diabetes mellitus with diabetic neuropathy, unspecified: Secondary | ICD-10-CM

## 2018-06-23 NOTE — Progress Notes (Signed)
Subjective: Brandy GarnetBarbara Flowers is a 78 y.o. female patient with history of diabetes, not on any medications, who presents to office today complaining of long, painful nails while ambulating in shoes; unable to trim. Patient states that the glucose reading this morning was 110 and saw PCP 3 weeks ago with last A1c recorded at 6.8 like before.  Patient reports that she thinks she is getting a callus or rubbing from the third toenail cutting into the second toenail on the right foot.  Patient denies any pain but did notice the spot.  Patient denies any warmth redness drainage or any other constitutional symptoms at this time. No other issues noted.  Patient Active Problem List   Diagnosis Date Noted  . Renal artery stenosis (HCC)   . Hyponatremia   . Labile blood pressure   . 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 Oropharyngeal dysphagia   . Slurred speech   . Diastolic dysfunction   . Dysphagia, post-stroke   . Hypokalemia   . Stroke (cerebrum) (HCC) 12/17/2017  . Anxiety state 12/17/2017  . 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 Left renal artery stenosis (HCC) 12/05/2014  . 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 Hypertension 09/20/2013  . Hypothyroid 09/20/2013   Current Outpatient Medications on File Prior to Visit  Medication Sig Dispense Refill  . acetaminophen (TYLENOL) 325 MG tablet Take 2 tablets (650 mg total) by mouth every 4 (four) hours as needed for mild pain (or temp > 37.5 C (99.5 F)).    Marland Kitchen. aspirin 325 MG tablet Take 1 tablet (325 mg total) by mouth daily.    Marland Kitchen. atenolol (TENORMIN) 25 MG tablet Take 1 tablet (25 mg total) by mouth daily.    . clonazePAM (KLONOPIN) 0.5 MG disintegrating tablet Take 1 tablet (0.5 mg total) by mouth 2 (two) times daily. 6 tablet 0  . clopidogrel (PLAVIX) 75 MG tablet Take 1 tablet (75 mg total) by mouth daily.    Marland Kitchen. ezetimibe (ZETIA) 10 MG tablet Take 1 tablet (10 mg total) by mouth daily.    Marland Kitchen.  gabapentin (NEURONTIN) 100 MG capsule Take 1 capsule (100 mg total) by mouth at bedtime. 3 capsule 0  . levothyroxine (SYNTHROID, LEVOTHROID) 50 MCG tablet Take 100 mcg by mouth daily before breakfast.     . losartan (COZAAR) 25 MG tablet Take 0.5 tablets (12.5 mg total) by mouth daily.    Marland Kitchen. scopolamine (TRANSDERM-SCOP) 1 MG/3DAYS Place 1 patch (1.5 mg total) onto the skin every 3 (three) days. 3 patch 0  . topiramate (TOPAMAX) 50 MG tablet Take 1 tablet (50 mg total) by mouth 2 (two) times daily. 6 tablet 0  . traMADol (ULTRAM) 50 MG tablet Take 1 tablet (50 mg total) by mouth every 6 (six) hours as needed for moderate pain. 3 tablet 0   No current facility-administered medications on file prior to visit.    Allergies  Allergen Reactions  . Blue Dyes (Parenteral) Shortness Of Breath and Rash  . Sulfa Antibiotics Anaphylaxis  . Ace Inhibitors Nausea And Vomiting  . Norvasc [Amlodipine] Nausea And Vomiting  . Calcium Channel Blockers Diarrhea    Recent Results (from the past 2160 hour(s))  Comprehensive metabolic panel     Status: Abnormal   Collection Time: 05/14/18 11:13 AM  Result Value Ref Range   Sodium 138 135 - 145 mmol/L   Potassium 2.9 (L) 3.5 - 5.1 mmol/L   Chloride 109 98 - 111 mmol/L   CO2  20 (L) 22 - 32 mmol/L   Glucose, Bld 96 70 - 99 mg/dL   BUN 25 (H) 8 - 23 mg/dL   Creatinine, Ser 0.45 0.44 - 1.00 mg/dL   Calcium 9.4 8.9 - 40.9 mg/dL   Total Protein 6.3 (L) 6.5 - 8.1 g/dL   Albumin 3.6 3.5 - 5.0 g/dL   AST 41 15 - 41 U/L   ALT 45 (H) 0 - 44 U/L   Alkaline Phosphatase 49 38 - 126 U/L   Total Bilirubin 0.8 0.3 - 1.2 mg/dL   GFR calc non Af Amer >60 >60 mL/min   GFR calc Af Amer >60 >60 mL/min   Anion gap 9 5 - 15    Comment: Performed at [facility], [address], [place], [place] [address]  CBC with Differential     Status: None   Collection Time: 05/14/18 11:13 AM  Result Value Ref Range   WBC 6.7 4.0 - 10.5 K/uL   RBC 5.02 3.87 - 5.11 MIL/uL    Hemoglobin 13.9 12.0 - 15.0 g/dL   HCT 47.8 29.5 - 62.1 %   MCV 83.5 80.0 - 100.0 fL   MCH 27.7 26.0 - 34.0 pg   MCHC 33.2 30.0 - 36.0 g/dL   RDW 30.8 65.7 - 84.6 %   Platelets 240 150 - 400 K/uL   nRBC 0.0 0.0 - 0.2 %   Neutrophils Relative % 52 %   Neutro Abs 3.5 1.7 - 7.7 K/uL   Lymphocytes Relative 31 %   Lymphs Abs 2.1 0.7 - 4.0 K/uL   Monocytes Relative 14 %   Monocytes Absolute 0.9 0.1 - 1.0 K/uL   Eosinophils Relative 2 %   Eosinophils Absolute 0.1 0.0 - 0.5 K/uL   Basophils Relative 1 %   Basophils Absolute 0.1 0.0 - 0.1 K/uL   Immature Granulocytes 0 %   Abs Immature Granulocytes 0.02 0.00 - 0.07 K/uL    Comment: Performed at [facility], [address] [place], [place] [address]  Magnesium     Status: None   Collection Time: 05/14/18 11:13 AM  Result Value Ref Range   Magnesium 1.9 1.7 - 2.4 mg/dL    Comment: Performed at [facility], [address] [place], [place] [address]    Objective: General: Patient is awake, alert and in no acute distress.  Using rolling walker.  Integument: Skin is warm, dry and supple bilateral. Nails are tender, long, thickened and dystrophic with subungual debris, consistent with onychomycosis, 1-5 bilateral. No signs of infection. No open lesions or preulcerative lesions present bilateral. Small pinch callus at distal tuft of left 4th toe with no signs of infection.  There is a small abrasion at the lateral aspect of the right second toe from the third toenail digging in with no signs of infection.  Remaining integument unremarkable.  Vasculature:  Dorsalis Pedis pulse 1/4 bilateral. Posterior Tibial pulse  1/4 bilateral. Capillary fill time <3 sec 1-5 bilateral. Positive hair growth to the level of the digits.Temperature gradient within normal limits. No varicosities present bilateral. No edema present bilateral.   Neurology: The patient has intact sensation measured with a 5.07/10g Semmes Weinstein Monofilament at all pedal  sites bilateral . Vibratory sensation diminished bilateral with tuning fork. No Babinski sign present bilateral.   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  Strength acceptable for patient status.  No tenderness with calf compression bilateral.  Assessment and Plan: Problem  List Items Addressed This Visit      Endocrine   Type 2 diabetes, controlled, with neuropathy (HCC)    Other Visit Diagnoses    Pain due to onychomycosis of toenail    -  Primary   Anticoagulant long-term use       History of stroke         -Examined patient. -Discussed and educated patient on diabetic foot care, especially with  regards to the vascular, neurological and musculoskeletal systems.  -Advised patient to closely monitor the abrasion at the right second toe from her third toenail growing out so long digging in continue to apply antibiotic cream and a Band-Aid for 1 week and if worsens to return to office for follow-up evaluation -Mechanically debrided all nails 1-5 bilateral using sterile nail nipper and filed with dremel without incident  -Smoothed callus using bur at tip of left 4th toe without incident  -Continue with cane or walker for stability in gait secondary to stroke -Patient to return  in 2 to 3 months for at risk foot care -Patient advised to call the office if any problems or questions arise in the meantime.  Asencion Islam, DPM

## 2018-07-06 ENCOUNTER — Encounter (HOSPITAL_COMMUNITY): Payer: Medicare Other

## 2018-07-06 ENCOUNTER — Ambulatory Visit: Payer: Medicare Other | Admitting: Vascular Surgery

## 2018-07-19 ENCOUNTER — Telehealth (HOSPITAL_COMMUNITY): Payer: Self-pay | Admitting: Rehabilitation

## 2018-07-19 NOTE — Telephone Encounter (Signed)

## 2018-07-20 ENCOUNTER — Ambulatory Visit: Payer: Medicare Other | Admitting: Vascular Surgery

## 2018-07-20 ENCOUNTER — Other Ambulatory Visit: Payer: Self-pay

## 2018-07-20 ENCOUNTER — Ambulatory Visit (INDEPENDENT_AMBULATORY_CARE_PROVIDER_SITE_OTHER): Payer: Medicare Other | Admitting: Vascular Surgery

## 2018-07-20 ENCOUNTER — Encounter (HOSPITAL_COMMUNITY): Payer: Medicare Other

## 2018-07-20 ENCOUNTER — Encounter: Payer: Self-pay | Admitting: Vascular Surgery

## 2018-07-20 ENCOUNTER — Ambulatory Visit (HOSPITAL_COMMUNITY)
Admission: RE | Admit: 2018-07-20 | Discharge: 2018-07-20 | Disposition: A | Discharge status: Home / Self Care | Payer: Medicare Other | Source: Ambulatory Visit | Attending: Vascular Surgery | Admitting: Vascular Surgery

## 2018-07-20 VITALS — BP 140/84 | HR 78

## 2018-07-20 DIAGNOSIS — I701 Atherosclerosis of renal artery: Secondary | ICD-10-CM

## 2018-07-20 NOTE — Progress Notes (Signed)
Virtual Visit via Telephone Note  We initially tried to set up this visit as a video visit but were unable to do this due to patient's limitations of video access.  I connected with Brandy Flowers on 07/20/2018 using the telephone and verified that I was speaking with the correct person using two identifiers. Patient was located at home and accompanied by no one. I am located at our [facility] office.   The limitations of evaluation and management by telemedicine and the availability of in person appointments have been previously discussed with the patient and are documented in the patients chart. The patient expressed understanding and consented to proceed.  PCP: Philemon Kingdom, MD   07/20/2018, 10:48 AM Patient is a 78 year old female that we have been following for borderline renal artery stenosis.  She states that her blood pressure has been fairly well controlled.  She occasionally goes into the 140s to 150s systolic but usually is below this.  Occasionally her diastolic pressure will climb as high as 86-92 but again usually remains below this.  Her blood pressure medications have not changed since the last time we saw her.  She currently is on aspirin but her Plavix was discontinued due to concerns about liver dysfunction.  She did have a deep brain pontine stroke in November 2019.  This was thought to be secondary to hypertension.  She is currently on 2 antihypertensives.  Is on losartan 50 daily and atenolol 25 daily.  She states she has monitored her diabetes closely and her glucose levels are good.  She also is walking twice daily and states that this helps her mental status and physical wellbeing.  Past Medical History:  Diagnosis Date  . Anxiety   . Arthritis   . Diabetes mellitus without complication (HCC)   . Hypertension   . Sinus problem   . Thyroid disease     Past Surgical History:  Procedure Laterality Date  . BREAST SURGERY    . EYE SURGERY     Current  Outpatient Medications on File Prior to Visit  Medication Sig Dispense Refill  . acetaminophen (TYLENOL) 325 MG tablet Take 2 tablets (650 mg total) by mouth every 4 (four) hours as needed for mild pain (or temp > 37.5 C (99.5 F)).    Marland Kitchen aspirin 325 MG tablet Take 1 tablet (325 mg total) by mouth daily.    Marland Kitchen atenolol (TENORMIN) 25 MG tablet Take 1 tablet (25 mg total) by mouth daily.    . clonazePAM (KLONOPIN) 0.5 MG disintegrating tablet Take 1 tablet (0.5 mg total) by mouth 2 (two) times daily. 6 tablet 0  . diclofenac sodium (VOLTAREN) 1 % GEL APPLY 2 GRAMS TO AFFECT JOINT WITH PAIN FOUR TIMES A DAY AS NEEDED FOR 30 DAYS    . ezetimibe (ZETIA) 10 MG tablet Take 1 tablet (10 mg total) by mouth daily.    Marland Kitchen gabapentin (NEURONTIN) 100 MG capsule Take 1 capsule (100 mg total) by mouth at bedtime. 3 capsule 0  . levothyroxine (SYNTHROID, LEVOTHROID) 50 MCG tablet Take 100 mcg by mouth daily before breakfast.     . lidocaine (LIDODERM) 5 % APPLY 1 PATCH TO INTACT SKIN REMOVE AFTER 12 HOURS ONCE A DAY TO NECK AS NEEDED    . LORazepam (ATIVAN) 1 MG tablet Take 1 mg by mouth 3 (three) times daily as needed.    Marland Kitchen losartan (COZAAR) 50 MG tablet     . scopolamine (TRANSDERM-SCOP) 1 MG/3DAYS Place 1 patch (  1.5 mg total) onto the skin every 3 (three) days. 3 patch 0  . topiramate (TOPAMAX) 50 MG tablet Take 1 tablet (50 mg total) by mouth 2 (two) times daily. 6 tablet 0   No current facility-administered medications on file prior to visit.      Physical exam:  Vitals:   07/20/18 1026  BP: 140/84  Pulse: 78   Data: Patient had a renal duplex ultrasound today which showed less than 60% renal artery stenosis bilaterally.  Renal mass was 10 cm bilaterally.  Assessment: Mild renal artery stenosis not significant enough to require intervention at this point.  Would consider intervention only with velocities indicating a stenosis greater than 70% with additions of multiple blood pressure medications or  decline in renal function.  She currently has none of this.  Plan: Patient will have a follow-up renal duplex scan in 1 year.   I spent 10 minutes with the patient via telephone encounter. Fabienne Brunsharles Kaysey Berndt, MD [facility] Office: [phone] Pager: [phone]

## 2018-08-25 ENCOUNTER — Ambulatory Visit: Payer: Medicare Other | Admitting: Sports Medicine

## 2018-09-01 ENCOUNTER — Encounter: Payer: Self-pay | Admitting: Sports Medicine

## 2018-09-01 ENCOUNTER — Other Ambulatory Visit: Payer: Self-pay

## 2018-09-01 ENCOUNTER — Ambulatory Visit (INDEPENDENT_AMBULATORY_CARE_PROVIDER_SITE_OTHER): Payer: Medicare Other | Admitting: Sports Medicine

## 2018-09-01 VITALS — Temp 98.4°F | Resp 16

## 2018-09-01 DIAGNOSIS — B351 Tinea unguium: Secondary | ICD-10-CM

## 2018-09-01 DIAGNOSIS — Z7901 Long term (current) use of anticoagulants: Secondary | ICD-10-CM

## 2018-09-01 DIAGNOSIS — E1140 Type 2 diabetes mellitus with diabetic neuropathy, unspecified: Secondary | ICD-10-CM

## 2018-09-01 DIAGNOSIS — M79676 Pain in unspecified toe(s): Secondary | ICD-10-CM

## 2018-09-01 DIAGNOSIS — Z8673 Personal history of transient ischemic attack (TIA), and cerebral infarction without residual deficits: Secondary | ICD-10-CM | POA: Diagnosis not present

## 2018-09-01 NOTE — Progress Notes (Signed)
Subjective: Brandy GarnetBarbara Flowers is a 78 y.o. female patient with history of diabetes, not on any medications, who presents to office today complaining of long, painful nails while ambulating in shoes; unable to trim. Patient states that the glucose reading this morning was 130 and saw PCP 3 weeks ago with last A1c recorded at 6. No other issues noted.  Patient Active Problem List   Diagnosis Date Noted  . Renal artery stenosis (HCC)   . Hyponatremia   . Labile blood pressure   . 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 Oropharyngeal dysphagia   . Slurred speech   . Diastolic dysfunction   . Dysphagia, post-stroke   . Hypokalemia   . Stroke (cerebrum) (HCC) 12/17/2017  . Anxiety state 12/17/2017  . 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 Left renal artery stenosis (HCC) 12/05/2014  . 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 Hypertension 09/20/2013  . Hypothyroid 09/20/2013   Current Outpatient Medications on File Prior to Visit  Medication Sig Dispense Refill  . acetaminophen (TYLENOL) 325 MG tablet Take 2 tablets (650 mg total) by mouth every 4 (four) hours as needed for mild pain (or temp > 37.5 C (99.5 F)).    Marland Kitchen. aspirin 325 MG tablet Take 1 tablet (325 mg total) by mouth daily.    Marland Kitchen. atenolol (TENORMIN) 25 MG tablet Take 1 tablet (25 mg total) by mouth daily.    . clonazePAM (KLONOPIN) 0.5 MG disintegrating tablet Take 1 tablet (0.5 mg total) by mouth 2 (two) times daily. 6 tablet 0  . diclofenac sodium (VOLTAREN) 1 % GEL APPLY 2 GRAMS TO AFFECT JOINT WITH PAIN FOUR TIMES A DAY AS NEEDED FOR 30 DAYS    . ezetimibe (ZETIA) 10 MG tablet Take 1 tablet (10 mg total) by mouth daily.    Marland Kitchen. gabapentin (NEURONTIN) 100 MG capsule Take 1 capsule (100 mg total) by mouth at bedtime. 3 capsule 0  . levothyroxine (SYNTHROID, LEVOTHROID) 50 MCG tablet Take 100 mcg by mouth daily before breakfast.     . lidocaine (LIDODERM) 5 % APPLY 1 PATCH TO INTACT SKIN  REMOVE AFTER 12 HOURS ONCE A DAY TO NECK AS NEEDED    . LORazepam (ATIVAN) 1 MG tablet Take 1 mg by mouth 3 (three) times daily as needed.    Marland Kitchen. losartan (COZAAR) 50 MG tablet     . scopolamine (TRANSDERM-SCOP) 1 MG/3DAYS Place 1 patch (1.5 mg total) onto the skin every 3 (three) days. 3 patch 0  . topiramate (TOPAMAX) 50 MG tablet Take 1 tablet (50 mg total) by mouth 2 (two) times daily. 6 tablet 0   No current facility-administered medications on file prior to visit.    Allergies  Allergen Reactions  . Blue Dyes (Parenteral) Shortness Of Breath and Rash  . Sulfa Antibiotics Anaphylaxis  . Ace Inhibitors Nausea And Vomiting  . Norvasc [Amlodipine] Nausea And Vomiting  . Calcium Channel Blockers Diarrhea  . Plavix [Clopidogrel Bisulfate]     Elevated liver enzymes    No results found for this or any previous visit (from the past 2160 hour(s)).  Objective: General: Patient is awake, alert and in no acute distress.  Using rolling walker.  Integument: Skin is warm, dry and supple bilateral. Nails are tender, long, thickened and dystrophic with subungual debris, consistent with onychomycosis, 1-5 bilateral. No signs of infection. No open lesions or preulcerative lesions present bilateral. Small pinch callus at distal tuft of left 4th toe with no signs  of infection.  Remaining integument unremarkable.  Vasculature:  Dorsalis Pedis pulse 1/4 bilateral. Posterior Tibial pulse  1/4 bilateral. Capillary fill time <3 sec 1-5 bilateral. Positive hair growth to the level of the digits.Temperature gradient within normal limits. No varicosities present bilateral. No edema present bilateral.   Neurology: The patient has intact sensation measured with a 5.07/10g Semmes Weinstein Monofilament at all pedal sites bilateral . Vibratory sensation diminished bilateral with tuning fork. No Babinski sign present bilateral.   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   Strength acceptable for patient status.  No tenderness with calf compression bilateral.  Assessment and Plan: Problem List Items Addressed This Visit      Endocrine   Type 2 diabetes, controlled, with neuropathy ([place])    Other Visit Diagnoses    Pain due to onychomycosis of toenail    -  Primary   Anticoagulant long-term use       History of stroke         -Examined patient. -Discussed and educated patient on diabetic foot care, especially with  regards to the vascular, neurological and musculoskeletal systems.  -Mechanically debrided all nails 1-5 bilateral using sterile nail nipper and filed with dremel without incident   -Smoothed callus using bur at tip of left 4th toe without incident  -Continue with cane or walker for stability in gait secondary to stroke -Patient to return  in 2 to 3 months for at risk foot care -Patient advised to call the office if any problems or questions arise in the meantime.  Landis Martins, DPM

## 2018-12-01 ENCOUNTER — Ambulatory Visit: Payer: Medicare Other | Admitting: Sports Medicine

## 2018-12-06 ENCOUNTER — Ambulatory Visit (INDEPENDENT_AMBULATORY_CARE_PROVIDER_SITE_OTHER): Payer: Medicare Other | Admitting: Sports Medicine

## 2018-12-06 ENCOUNTER — Encounter: Payer: Self-pay | Admitting: Sports Medicine

## 2018-12-06 ENCOUNTER — Other Ambulatory Visit: Payer: Self-pay

## 2018-12-06 DIAGNOSIS — M79676 Pain in unspecified toe(s): Secondary | ICD-10-CM

## 2018-12-06 DIAGNOSIS — B351 Tinea unguium: Secondary | ICD-10-CM | POA: Diagnosis not present

## 2018-12-06 DIAGNOSIS — Z8673 Personal history of transient ischemic attack (TIA), and cerebral infarction without residual deficits: Secondary | ICD-10-CM

## 2018-12-06 DIAGNOSIS — Z7901 Long term (current) use of anticoagulants: Secondary | ICD-10-CM

## 2018-12-06 DIAGNOSIS — E1140 Type 2 diabetes mellitus with diabetic neuropathy, unspecified: Secondary | ICD-10-CM

## 2018-12-06 NOTE — Progress Notes (Signed)
Subjective: Brandy Flowers is a 78 y.o. female patient with history of diabetes, not on any medications, who presents to office today complaining of long, painful nails while ambulating in shoes; unable to trim. Patient states that the glucose reading this morning was 110 and saw PCP Dr. Verdell Face x Sept. with last A1c recorded at 6. No other issues noted.  Reports that she is still recovering from her stroke but otherwise is doing ok.  Patient Active Problem List   Diagnosis Date Noted  . Renal artery stenosis ([place])   . Hyponatremia   . Labile blood pressure   . 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 ([place]) 12/20/2017  . Oropharyngeal dysphagia   . Slurred speech   . Diastolic dysfunction   . Dysphagia, post-stroke   . Hypokalemia   . Stroke (cerebrum) ([place]) 12/17/2017  . Anxiety state 12/17/2017  . Splenic artery aneurysm ([place]) 12/05/2014  . Left renal artery stenosis ([place]) 12/05/2014  . Type 2 diabetes, controlled, with neuropathy ([place]) 09/20/2013  . Hypertension 09/20/2013  . Hypothyroid 09/20/2013   Current Outpatient Medications on File Prior to Visit  Medication Sig Dispense Refill  . ACCU-CHEK GUIDE test strip CHECK BLOOD SUGAR DAILY DX  E11.9 50    . acetaminophen (TYLENOL) 325 MG tablet Take 2 tablets (650 mg total) by mouth every 4 (four) hours as needed for mild pain (or temp > 37.5 C (99.5 F)).    Marland Kitchen aspirin 325 MG tablet Take 1 tablet (325 mg total) by mouth daily.    Marland Kitchen atenolol (TENORMIN) 25 MG tablet Take 1 tablet (25 mg total) by mouth daily.    . clonazePAM (KLONOPIN) 0.5 MG disintegrating tablet Take 1 tablet (0.5 mg total) by mouth 2 (two) times daily. 6 tablet 0  . diclofenac sodium (VOLTAREN) 1 % GEL APPLY 2 GRAMS TO AFFECT JOINT WITH PAIN FOUR TIMES A DAY AS NEEDED FOR 30 DAYS    . ezetimibe (ZETIA) 10 MG tablet Take 1 tablet (10 mg total) by mouth daily.    Marland Kitchen gabapentin (NEURONTIN) 100 MG capsule Take 1 capsule (100 mg total) by mouth at bedtime. 3 capsule 0  .  levothyroxine (SYNTHROID, LEVOTHROID) 50 MCG tablet Take 100 mcg by mouth daily before breakfast.     . lidocaine (LIDODERM) 5 % APPLY 1 PATCH TO INTACT SKIN REMOVE AFTER 12 HOURS ONCE A DAY TO NECK AS NEEDED    . LORazepam (ATIVAN) 1 MG tablet Take 1 mg by mouth 3 (three) times daily as needed.    Marland Kitchen losartan (COZAAR) 25 MG tablet Take 50 mg by mouth 2 (two) times daily.    Marland Kitchen losartan (COZAAR) 50 MG tablet     . scopolamine (TRANSDERM-SCOP) 1 MG/3DAYS Place 1 patch (1.5 mg total) onto the skin every 3 (three) days. 3 patch 0  . topiramate (TOPAMAX) 50 MG tablet Take 1 tablet (50 mg total) by mouth 2 (two) times daily. 6 tablet 0   No current facility-administered medications on file prior to visit.    Allergies  Allergen Reactions  . Blue Dyes (Parenteral) Shortness Of Breath and Rash  . Sulfa Antibiotics Anaphylaxis  . Ace Inhibitors Nausea And Vomiting  . Norvasc [Amlodipine] Nausea And Vomiting  . Calcium Channel Blockers Diarrhea  . Plavix [Clopidogrel Bisulfate]     Elevated liver enzymes    No results found for this or any previous visit (from the past 2160 hour(s)).  Objective: General: Patient is awake, alert and in no acute distress.  Using rolling  walker.  Integument: Skin is warm, dry and supple bilateral. Nails are tender, long, thickened and dystrophic with subungual debris, consistent with onychomycosis, 1-5 bilateral. No signs of infection. No open lesions or preulcerative lesions present bilateral. Small pinch callus at distal tuft of left 4th toe with no signs of infection.  Remaining integument unremarkable.  Vasculature:  Dorsalis Pedis pulse 1/4 bilateral. Posterior Tibial pulse  1/4 bilateral. Capillary fill time <3 sec 1-5 bilateral. Positive hair growth to the level of the digits.Temperature gradient within normal limits. No varicosities present bilateral. No edema present bilateral.   Neurology: The patient has intact sensation measured with a 5.07/10g Semmes  Weinstein Monofilament at all pedal sites bilateral . Vibratory sensation diminished bilateral with tuning fork. No Babinski sign present bilateral.   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  Strength acceptable for patient status.  No tenderness with calf compression bilateral.  Assessment and Plan: Problem List Items Addressed This Visit      Endocrine   Type 2 diabetes, controlled, with neuropathy (HCC)   Relevant Medications   losartan (COZAAR) 25 MG tablet    Other Visit Diagnoses    Pain due to onychomycosis of toenail    -  Primary   Anticoagulant long-term use       History of stroke          -Examined patient. -Re-discussed and educated patient on diabetic foot care, especially with  regards to the vascular, neurological and musculoskeletal systems.  -Mechanically debrided all nails 1-5 bilateral using sterile nail nipper and filed with dremel without incident   -Smoothed callus using bur at tip of left 4th toe without incident  -Dispensed toe cushion for left 4th toe -Continue with cane or walker for stability in gait secondary to stroke -Patient to return in 2.5 to 3 months for at risk foot care -Patient advised to call the office if any problems or questions arise in the meantime.  Asencion Islam, DPM

## 2018-12-26 ENCOUNTER — Emergency Department (HOSPITAL_COMMUNITY): Payer: Medicare Other

## 2018-12-26 ENCOUNTER — Encounter (HOSPITAL_COMMUNITY): Payer: Self-pay

## 2018-12-26 ENCOUNTER — Emergency Department (HOSPITAL_COMMUNITY)
Admission: EM | Admit: 2018-12-26 | Discharge: 2018-12-26 | Disposition: A | Discharge status: Home / Self Care | Payer: Medicare Other | Attending: Emergency Medicine | Admitting: Emergency Medicine

## 2018-12-26 ENCOUNTER — Other Ambulatory Visit: Payer: Self-pay

## 2018-12-26 DIAGNOSIS — Z79899 Other long term (current) drug therapy: Secondary | ICD-10-CM | POA: Diagnosis not present

## 2018-12-26 DIAGNOSIS — E1140 Type 2 diabetes mellitus with diabetic neuropathy, unspecified: Secondary | ICD-10-CM | POA: Diagnosis not present

## 2018-12-26 DIAGNOSIS — R42 Dizziness and giddiness: Secondary | ICD-10-CM | POA: Insufficient documentation

## 2018-12-26 DIAGNOSIS — I10 Essential (primary) hypertension: Secondary | ICD-10-CM | POA: Diagnosis not present

## 2018-12-26 DIAGNOSIS — E039 Hypothyroidism, unspecified: Secondary | ICD-10-CM | POA: Insufficient documentation

## 2018-12-26 DIAGNOSIS — Z7982 Long term (current) use of aspirin: Secondary | ICD-10-CM | POA: Insufficient documentation

## 2018-12-26 DIAGNOSIS — R519 Headache, unspecified: Secondary | ICD-10-CM | POA: Diagnosis not present

## 2018-12-26 DIAGNOSIS — R531 Weakness: Secondary | ICD-10-CM | POA: Diagnosis not present

## 2018-12-26 LAB — HEPATIC FUNCTION PANEL
ALT: 22 U/L (ref 0–44)
AST: 24 U/L (ref 15–41)
Albumin: 3.8 g/dL (ref 3.5–5.0)
Alkaline Phosphatase: 60 U/L (ref 38–126)
Bilirubin, Direct: <0.1 mg/dL (ref 0.0–0.2)
Total Bilirubin: 1 mg/dL (ref 0.3–1.2)
Total Protein: 6.9 g/dL (ref 6.5–8.1)

## 2018-12-26 LAB — BASIC METABOLIC PANEL
Anion gap: 11 (ref 5–15)
BUN: 28 mg/dL — ABNORMAL HIGH (ref 8–23)
CO2: 23 mmol/L (ref 22–32)
Calcium: 9.1 mg/dL (ref 8.9–10.3)
Chloride: 105 mmol/L (ref 98–111)
Creatinine, Ser: 0.74 mg/dL (ref 0.44–1.00)
GFR calc Af Amer: >60 mL/min (ref >60)
GFR calc non Af Amer: >60 mL/min (ref >60)
Glucose, Bld: 95 mg/dL (ref 70–99)
Potassium: 5.3 mmol/L — ABNORMAL HIGH (ref 3.5–5.1)
Sodium: 139 mmol/L (ref 135–145)

## 2018-12-26 LAB — CBC
HCT: 45.6 % (ref 36.0–46.0)
Hemoglobin: 15.2 g/dL — ABNORMAL HIGH (ref 12.0–15.0)
MCH: 28.9 pg (ref 26.0–34.0)
MCHC: 33.3 g/dL (ref 30.0–36.0)
MCV: 86.7 fL (ref 80.0–100.0)
Platelets: 256 10*3/uL (ref 150–400)
RBC: 5.26 MIL/uL — ABNORMAL HIGH (ref 3.87–5.11)
RDW: 12.8 % (ref 11.5–15.5)
WBC: 7.8 10*3/uL (ref 4.0–10.5)
nRBC: 0 % (ref 0.0–0.2)

## 2018-12-26 LAB — ETHANOL: Alcohol, Ethyl (B): <10 mg/dL (ref <10)

## 2018-12-26 LAB — CBG MONITORING, ED: Glucose-Capillary: 90 mg/dL (ref 70–99)

## 2018-12-26 MED ORDER — DIAZEPAM 5 MG/ML IJ SOLN
2.5000 mg | Freq: Once | INTRAMUSCULAR | Status: AC
  Administered 2018-12-26: 2.5 mg via INTRAVENOUS
  Filled 2018-12-26: qty 2

## 2018-12-26 MED ORDER — DIPHENHYDRAMINE HCL 50 MG/ML IJ SOLN
25.0000 mg | Freq: Once | INTRAMUSCULAR | Status: AC
  Administered 2018-12-26: 25 mg via INTRAVENOUS
  Filled 2018-12-26: qty 1

## 2018-12-26 MED ORDER — LOSARTAN POTASSIUM 50 MG PO TABS
50.0000 mg | ORAL_TABLET | Freq: Once | ORAL | Status: AC
  Administered 2018-12-26: 21:00:00 50 mg via ORAL
  Filled 2018-12-26: qty 1

## 2018-12-26 MED ORDER — MECLIZINE HCL 25 MG PO TABS: 25.0000 mg | ORAL_TABLET | Freq: Three times a day (TID) | ORAL | 0 refills | Status: DC | PRN

## 2018-12-26 MED ORDER — ATENOLOL 25 MG PO TABS
25.0000 mg | ORAL_TABLET | Freq: Once | ORAL | Status: AC
  Administered 2018-12-26: 25 mg via ORAL
  Filled 2018-12-26: qty 1

## 2018-12-26 MED ORDER — SODIUM CHLORIDE 0.9% FLUSH: 3.0000 mL | Freq: Once | INTRAVENOUS | Status: DC

## 2018-12-26 MED ORDER — SODIUM CHLORIDE 0.9 % IV BOLUS
1000.0000 mL | Freq: Once | INTRAVENOUS | Status: AC
  Administered 2018-12-26: 16:00:00 1000 mL via INTRAVENOUS

## 2018-12-26 MED ORDER — PROCHLORPERAZINE EDISYLATE 10 MG/2ML IJ SOLN
10.0000 mg | Freq: Once | INTRAMUSCULAR | Status: AC
  Administered 2018-12-26: 16:00:00 10 mg via INTRAVENOUS
  Filled 2018-12-26: qty 2

## 2018-12-26 NOTE — ED Triage Notes (Signed)
Pt BIB [facility] w/dizziness upon standing and Right leg pain starting at 0600 this. Denies SOB, HA, CP, falls  20g RFA   190/86 64 98% RA  119 CBG

## 2018-12-26 NOTE — ED Notes (Signed)
Pt walked with strong gait, complains of feeling slightly dizzy

## 2018-12-26 NOTE — ED Notes (Signed)
Patient transported to MRI 

## 2018-12-26 NOTE — ED Notes (Addendum)
Pt now c/o headache, reports she's been under a lot of stress with the recent election.

## 2018-12-26 NOTE — ED Notes (Signed)
Pt stated she is due for BP home meds, Atenolol and Losartan. Notified ER MD.

## 2018-12-26 NOTE — ED Provider Notes (Signed)
[facility] EMERGENCY DEPARTMENT Provider Note   CSN: [id number] Arrival date & time: 12/26/18  4098     History   Chief Complaint Chief Complaint  Patient presents with  . Dizziness  . Leg Pain    HPI Brandy Flowers is a 78 y.o. female.     78 yo F with a chief complaint of a sensation that the room is spinning.  This started spontaneously while she was lying in bed.  Then resolved when she sat up.  She laid back down and it reoccurred.  Lasted about a minute at a time.  She was letting her dog out and spontaneously felt like both of her legs were too weak to stand and she collapsed to the ground.  Had to crawl to the phone and called 911.  She is unsure if her legs are still weak now.  Does not currently feel dizzy.  Denies cough congestion or fever denies nausea vomiting or diarrhea.  Has some mild headache that does not feel like one of her prior migraines.  Denies difficulty swallowing denies change in speech denies unilateral numbness or weakness.  She is concerned because her blood pressure is elevated.  Was recently seen by her family doctor couple weeks ago and had some difficulties taking a statin and so was trying diet modification for the past couple weeks.  The history is provided by the patient.  Dizziness Quality:  Head spinning Severity:  Moderate Onset quality:  Gradual Duration:  1 day Timing:  Constant Progression:  Partially resolved Chronicity:  New Context: head movement   Relieved by:  Being still Worsened by:  Nothing Ineffective treatments:  None tried Associated symptoms: weakness (bilateral legs)   Associated symptoms: no chest pain, no headaches, no nausea, no palpitations, no shortness of breath and no vomiting   Leg Pain Associated symptoms: no fever     Past Medical History:  Diagnosis Date  . Anxiety   . Arthritis   . Diabetes mellitus without complication (HCC)   . Hypertension   . Sinus problem   . Thyroid disease      Patient Active Problem List   Diagnosis Date Noted  . Renal artery stenosis (HCC)   . Hyponatremia   . Labile blood pressure   . 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 Oropharyngeal dysphagia   . Slurred speech   . Diastolic dysfunction   . Dysphagia, post-stroke   . Hypokalemia   . Stroke (cerebrum) (HCC) 12/17/2017  . Anxiety state 12/17/2017  . 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 Left renal artery stenosis (HCC) 12/05/2014  . 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 Hypertension 09/20/2013  . Hypothyroid 09/20/2013    Past Surgical History:  Procedure Laterality Date  . BREAST SURGERY    . EYE SURGERY       OB History   No obstetric history on file.      Home Medications    Prior to Admission medications   Medication Sig Start Date End Date Taking? Authorizing Provider  ACCU-CHEK GUIDE test strip CHECK BLOOD SUGAR DAILY DX  E11.9 50 11/20/18   [provider]  acetaminophen (TYLENOL) 325 MG tablet Take 2 tablets (650 mg total) by mouth every 4 (four) hours as needed for mild pain (or temp > 37.5 C (99.5 F)). 01/09/18   Angiulli, Mcarthur Rossetti, PA-C  aspirin 325 MG tablet Take 1 tablet (325 mg total) by mouth daily. 12/21/17  Domenic Polite, MD  atenolol (TENORMIN) 25 MG tablet Take 1 tablet (25 mg total) by mouth daily. 01/10/18   Angiulli, Lavon Paganini, PA-C  clonazePAM (KLONOPIN) 0.5 MG disintegrating tablet Take 1 tablet (0.5 mg total) by mouth 2 (two) times daily. 01/09/18   Angiulli, Lavon Paganini, PA-C  diclofenac sodium (VOLTAREN) 1 % GEL APPLY 2 GRAMS TO AFFECT JOINT WITH PAIN FOUR TIMES A DAY AS NEEDED FOR 30 DAYS 05/17/18   [provider]  ezetimibe (ZETIA) 10 MG tablet Take 1 tablet (10 mg total) by mouth daily. 12/21/17   Domenic Polite, MD  gabapentin (NEURONTIN) 100 MG capsule Take 1 capsule (100 mg total) by mouth at bedtime. 01/09/18   Angiulli, Lavon Paganini, PA-C  levothyroxine (SYNTHROID,  LEVOTHROID) 50 MCG tablet Take 100 mcg by mouth daily before breakfast.     [provider]  lidocaine (LIDODERM) 5 % APPLY 1 PATCH TO INTACT SKIN REMOVE AFTER 12 HOURS ONCE A DAY TO NECK AS NEEDED 06/15/18   [provider]  LORazepam (ATIVAN) 1 MG tablet Take 1 mg by mouth 3 (three) times daily as needed. 05/28/18   [provider]  losartan (COZAAR) 25 MG tablet Take 50 mg by mouth 2 (two) times daily. 11/12/18   [provider]  losartan (COZAAR) 50 MG tablet  02/23/18   [provider]  meclizine (ANTIVERT) 25 MG tablet Take 1 tablet (25 mg total) by mouth 3 (three) times daily as needed for dizziness. 12/26/18   Deno Etienne, DO  scopolamine (TRANSDERM-SCOP) 1 MG/3DAYS Place 1 patch (1.5 mg total) onto the skin every 3 (three) days. 01/12/18   Angiulli, Lavon Paganini, PA-C  topiramate (TOPAMAX) 50 MG tablet Take 1 tablet (50 mg total) by mouth 2 (two) times daily. 01/09/18   Angiulli, Lavon Paganini, PA-C    Family History Family History  Problem Relation Age of Onset  . Stroke Mother   . Diabetes Mother   . Heart disease Mother        Before age 73  . Hypertension Mother   . Hyperlipidemia Mother   . Heart disease Father   . Heart attack Father   . Heart disease Brother        After age 26- CABG  . Hypertension Brother     Social History Social History   Tobacco Use  . Smoking status: Never Smoker  . Smokeless tobacco: Never Used  Substance Use Topics  . Alcohol use: No  . Drug use: No     Allergies   Blue dyes (parenteral), Sulfa antibiotics, Ace inhibitors, Norvasc [amlodipine], Calcium channel blockers, and Plavix [clopidogrel bisulfate]   Review of Systems Review of Systems  Constitutional: Negative for chills and fever.  HENT: Negative for congestion and rhinorrhea.   Eyes: Negative for redness and visual disturbance.  Respiratory: Negative for shortness of breath and wheezing.   Cardiovascular: Negative for chest pain and  palpitations.  Gastrointestinal: Negative for nausea and vomiting.  Genitourinary: Negative for dysuria and urgency.  Musculoskeletal: Negative for arthralgias and myalgias.  Skin: Negative for pallor and wound.  Neurological: Positive for dizziness and weakness (bilateral legs). Negative for headaches.     Physical Exam Updated Vital Signs BP (!) 211/71 (BP Location: Right Arm)   Pulse 65   Temp 98.4 F (36.9 C) (Oral)   Resp 18   SpO2 100%   Physical Exam Vitals signs and nursing note reviewed.  Constitutional:      General: She is not in  acute distress.    Appearance: She is well-developed. She is not diaphoretic.  HENT:     Head: Normocephalic and atraumatic.     Comments: Tacky mucous membranes Eyes:     Pupils: Pupils are equal, round, and reactive to light.  Neck:     Musculoskeletal: Normal range of motion and neck supple.  Cardiovascular:     Rate and Rhythm: Normal rate and regular rhythm.     Heart sounds: No murmur. No friction rub. No gallop.   Pulmonary:     Effort: Pulmonary effort is normal.     Breath sounds: No wheezing or rales.  Abdominal:     General: There is no distension.     Palpations: Abdomen is soft.     Tenderness: There is no abdominal tenderness.  Musculoskeletal:        General: No tenderness.  Skin:    General: Skin is warm and dry.  Neurological:     Mental Status: She is alert and oriented to person, place, and time.     GCS: GCS eye subscore is 4. GCS verbal subscore is 5. GCS motor subscore is 6.     Cranial Nerves: Cranial nerves are intact.     Sensory: Sensation is intact.     Motor: Motor function is intact.     Coordination: Coordination is intact.     Comments: Benign neurologic exam  Psychiatric:        Behavior: Behavior normal.      ED Treatments / Results  Labs (all labs ordered are listed, but only abnormal results are displayed) Labs Reviewed  CBC - Abnormal; Notable for the following components:       Result Value   RBC 5.26 (*)    Hemoglobin 15.2 (*)    All other components within normal limits  BASIC METABOLIC PANEL - Abnormal; Notable for the following components:   Potassium 5.3 (*)    BUN 28 (*)    All other components within normal limits  HEPATIC FUNCTION PANEL  ETHANOL  URINALYSIS, ROUTINE W REFLEX MICROSCOPIC  CBG MONITORING, ED    EKG EKG Interpretation  Date/Time:  Tuesday December 26 2018 09:52:44 EST Ventricular Rate:  61 PR Interval:  152 QRS Duration: 82 QT Interval:  418 QTC Calculation: 420 R Axis:   9 Text Interpretation: Normal sinus rhythm Septal infarct , age undetermined Abnormal ECG No significant change since last tracing Confirmed by Melene PlanFloyd, Marguis Mathieson [phone]([id number]) on 12/26/2018 3:05:02 PM   Radiology Mr Brain Wo Contrast  Result Date: 12/26/2018 CLINICAL DATA:  Ataxia.  Rule out stroke. EXAM: MRI HEAD WITHOUT CONTRAST TECHNIQUE: Multiplanar, multiecho pulse sequences of the brain and surrounding structures were obtained without intravenous contrast. COMPARISON:  MRI head 12/21/2017 FINDINGS: Brain: Negative for acute infarct. 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 No significant white matter ischemia. Negative for hemorrhage or mass. Mild atrophy without hydrocephalus. Vascular: Normal arterial flow voids Skull and upper cervical spine: Negative Sinuses/Orbits: Mild mucosal edema paranasal sinuses. Bilateral cataract surgery Other: None IMPRESSION: No acute abnormality Atrophy and mild chronic ischemic change including the pons. Electronically Signed   By: Marlan Palauharles  Clark M.D.   On: 12/26/2018 20:37    Procedures Procedures (including critical care time)  Medications Ordered in ED Medications  sodium chloride flush (NS) 0.9 % injection 3 mL (has no administration in time range)  sodium chloride 0.9 % bolus 1,000 mL (0 mLs Intravenous Stopped 12/26/18 1700)  prochlorperazine (COMPAZINE) injection 10 mg (10 mg Intravenous  Given 12/26/18 1546)  diphenhydrAMINE  (BENADRYL) injection 25 mg (25 mg Intravenous Given 12/26/18 1546)  diazepam (VALIUM) injection 2.5 mg (2.5 mg Intravenous Given 12/26/18 1931)  atenolol (TENORMIN) tablet 25 mg (25 mg Oral Given 12/26/18 2032)  losartan (COZAAR) tablet 50 mg (50 mg Oral Given 12/26/18 2032)     Initial Impression / Assessment and Plan / ED Course  I have reviewed the triage vital signs and the nursing notes.  Pertinent labs & imaging results that were available during my care of the patient were reviewed by me and considered in my medical decision making (see chart for details).        78 yo F with a chief complaints of dizziness.  Sounds peripheral by history Short lasted occurred with minimal head movement and then resolved with keeping her head still.  She is no longer dizzy.  She then separately had an episode where she felt like her legs were spontaneously weak.  This was reportedly bilateral.  She has a benign neurologic exam now.  She is convinced that this was a stroke.  Has had a stroke in the past.  Is hypertensive here.  Has a mild headache.  Will treat with a headache cocktail meclizine bolus of IV fluids.  MRI to evaluate for posterior circulation stroke.  MRI negative, feeling better, ambulatory.  D/c home.   11:11 PM:  I have discussed the diagnosis/risks/treatment options with the patient and believe the pt to be eligible for discharge home to follow-up with PCP. We also discussed returning to the ED immediately if new or worsening sx occur. We discussed the sx which are most concerning (e.g., sudden worsening pain, fever, inability to tolerate by mouth) that necessitate immediate return. Medications administered to the patient during their visit and any new prescriptions provided to the patient are listed below.  Medications given during this visit Medications  sodium chloride flush (NS) 0.9 % injection 3 mL (has no administration in time range)  sodium chloride 0.9 % bolus 1,000 mL (0 mLs  Intravenous Stopped 12/26/18 1700)  prochlorperazine (COMPAZINE) injection 10 mg (10 mg Intravenous Given 12/26/18 1546)  diphenhydrAMINE (BENADRYL) injection 25 mg (25 mg Intravenous Given 12/26/18 1546)  diazepam (VALIUM) injection 2.5 mg (2.5 mg Intravenous Given 12/26/18 1931)  atenolol (TENORMIN) tablet 25 mg (25 mg Oral Given 12/26/18 2032)  losartan (COZAAR) tablet 50 mg (50 mg Oral Given 12/26/18 2032)     The patient appears reasonably screen and/or stabilized for discharge and I doubt any other medical condition or other [facility] requiring further screening, evaluation, or treatment in the ED at this time prior to discharge.    Final Clinical Impressions(s) / ED Diagnoses   Final diagnoses:  Dizziness    ED Discharge Orders         Ordered    meclizine (ANTIVERT) 25 MG tablet  3 times daily PRN     12/26/18 2117           Melene Plan, DO 12/26/18 2311

## 2018-12-26 NOTE — Discharge Instructions (Signed)
Return for worsening symptoms, inability to walk were one-sided weakness numbness difficulty with speech or swallowing.

## 2018-12-28 ENCOUNTER — Encounter: Payer: Self-pay | Admitting: Sports Medicine

## 2019-02-05 ENCOUNTER — Other Ambulatory Visit: Payer: Self-pay

## 2019-02-05 ENCOUNTER — Ambulatory Visit (INDEPENDENT_AMBULATORY_CARE_PROVIDER_SITE_OTHER): Payer: Medicare Other | Admitting: Neurology

## 2019-02-05 ENCOUNTER — Encounter: Payer: Self-pay | Admitting: Neurology

## 2019-02-05 VITALS — BP 165/70 | HR 64 | Temp 98.0°F | Ht 61.0 in | Wt 105.0 lb

## 2019-02-05 DIAGNOSIS — I701 Atherosclerosis of renal artery: Secondary | ICD-10-CM | POA: Diagnosis not present

## 2019-02-05 DIAGNOSIS — I6350 Cerebral infarction due to unspecified occlusion or stenosis of unspecified cerebral artery: Secondary | ICD-10-CM

## 2019-02-05 NOTE — Progress Notes (Addendum)
Reason for visit: Headaches, dizziness, cerebrovascular disease  Referring physician: Dr. Gennette Flowers is a 78 y.o. female  History of present illness:  Brandy Flowers is a 78 year old right-handed white female with a history of hypertension, diet-controlled diabetes, and dyslipidemia.  The patient was seen in the hospital with a pontine stroke on 17 December 2017.  The patient presented with generalized weakness and slurred speech that was acute in onset.  The patient has had some therapy since coming out of the hospital, she is walking with a cane.  She returned to the hospital on 26 December 2018 with vertigo that was positional in nature, coming on while lying down, going away with sitting.  MRI of the brain done at that time showed no acute changes.  The patient began having headaches sometime in the late summer 2020, she was referred to the office mainly for this reason.  The patient however claims that she has not had a headache over the last 2 months.  She claims that she is allergic to blue dye, she was taking losartan which was a green pill and called the pharmacist and got switched to a white tablet.  She has stopped having headaches since switching over to this other medication.  The patient has had some low back pain and some occasional right leg pain that comes and goes.  She may feel weak all over at times.  She does have a history of restless leg syndrome.  She has not been able to tolerate statin drugs, her LDL cholesterol remains elevated at 141.  Her last hemoglobin A1c was 6.1.  She comes to this office for further evaluation.  Past Medical History:  Diagnosis Date  . Anxiety   . Arthritis   . Diabetes mellitus without complication (HCC)   . Hypertension   . Sinus problem   . Stroke (HCC)   . Thyroid disease     Past Surgical History:  Procedure Laterality Date  . BREAST SURGERY    . CHOLECYSTECTOMY    . EYE SURGERY      Family History  Problem Relation  Age of Onset  . Stroke Mother   . Diabetes Mother   . Heart disease Mother        Before age 78  . Hypertension Mother   . Hyperlipidemia Mother   . Heart disease Father   . Heart attack Father   . Hypertension Father   . Heart disease Brother        After age 79- CABG  . Hypertension Brother     Social history:  reports that she has never smoked. She has never used smokeless tobacco. She reports that she does not drink alcohol or use drugs.  Medications:  Prior to Admission medications   Medication Sig Start Date End Date Taking? Authorizing Provider  ACCU-CHEK GUIDE test strip CHECK BLOOD SUGAR DAILY DX  E11.9 50 11/20/18  Yes [provider]  aspirin 325 MG tablet Take 1 tablet (325 mg total) by mouth daily. 12/21/17  Yes Zannie Cove, MD  atenolol (TENORMIN) 25 MG tablet Take 1 tablet (25 mg total) by mouth daily. Patient taking differently: Take 50 mg by mouth 2 (two) times daily.  01/10/18  Yes Angiulli, Mcarthur Rossetti, PA-C  diclofenac (FLECTOR) 1.3 % PTCH SMARTSIG:1 Patch(s) Topical Twice Daily PRN 01/22/19  Yes [provider]  diclofenac sodium (VOLTAREN) 1 % GEL APPLY 2 GRAMS TO AFFECT JOINT WITH PAIN FOUR TIMES A DAY AS  NEEDED FOR 30 DAYS 05/17/18  Yes [provider]  gabapentin (NEURONTIN) 100 MG capsule Take 1 capsule (100 mg total) by mouth at bedtime. 01/09/18  Yes Angiulli, Mcarthur Rossettianiel J, PA-C  lidocaine (LIDODERM) 5 % APPLY 1 PATCH TO INTACT SKIN REMOVE AFTER 12 HOURS ONCE A DAY TO NECK AS NEEDED 06/15/18  Yes [provider]  LORazepam (ATIVAN) 1 MG tablet Take 1 mg by mouth 3 (three) times daily as needed. 05/28/18  Yes [provider]  losartan (COZAAR) 25 MG tablet Take 25 mg by mouth 2 (two) times daily.  11/12/18  Yes [provider]  SYNTHROID 50 MCG tablet  02/01/19  Yes [provider]      Allergies  Allergen Reactions  . Amiloride Shortness Of Breath  . Blue Dyes (Parenteral) Shortness Of Breath and Rash   . Sulfa Antibiotics Anaphylaxis  . Ace Inhibitors Nausea And Vomiting  . Norvasc [Amlodipine] Nausea And Vomiting  . Calcium Channel Blockers Diarrhea  . Catapres [Clonidine Hcl]     Decreased mental clarity   . Plavix [Clopidogrel Bisulfate]     Elevated liver enzymes  . Plavix [Clopidogrel]     Increased LFT's   . Tramadol     Nausea as a dog per pt  . Zetia [Ezetimibe]     Liver  Function increase labs  . Amoxicillin Diarrhea  . Chlorthalidone     Dizziness.   . Doxycycline Diarrhea  . Fosamax [Alendronate Sodium]     Pt's teeth fell out while taking.   . Lexapro [Escitalopram]     Brain Fog   . Spironolactone Palpitations  . Statins     Upset stomach     ROS:  Out of a complete 14 system review of symptoms, the patient complains only of the following symptoms, and all other reviewed systems are negative.  Low back pain, right leg pain Walking difficulty  Blood pressure (!) 165/70, pulse 64, temperature 98 F (36.7 C), height 5\' 1"  (1.549 m), weight 105 lb (47.6 kg).   Repeat blood pressure, right arm, sitting is 194/70.  Physical Exam  General: The patient is alert and cooperative at the time of the examination.  Eyes: Pupils are equal, round, and reactive to light. Discs are flat bilaterally.  Neck: The neck is supple, no carotid bruits are noted.  Respiratory: The respiratory examination is clear.  Cardiovascular: The cardiovascular examination reveals a regular rate and rhythm, no obvious murmurs or rubs are noted.  Skin: Extremities are without significant edema.  Neurologic Exam  Mental status: The patient is alert and oriented x 3 at the time of the examination. The patient has apparent normal recent and remote memory, with an apparently normal attention span and concentration ability.  Cranial nerves: Facial symmetry is present. There is good sensation of the face to pinprick and soft touch bilaterally. The strength of the facial muscles and the  muscles to head turning and shoulder shrug are normal bilaterally. Speech is well enunciated, no aphasia or dysarthria is noted. Extraocular movements are full. Visual fields are full. The tongue is midline, and the patient has symmetric elevation of the soft palate. No obvious hearing deficits are noted.  Motor: The motor testing reveals 5 over 5 strength of all 4 extremities, with exception of 4/5 strength with hip flexion bilaterally. Good symmetric motor tone is noted throughout.  Sensory: Sensory testing is intact to pinprick, soft touch, vibration sensation, and position sense on all 4 extremities. No evidence of  extinction is noted.  Coordination: Cerebellar testing reveals good finger-nose-finger and heel-to-shin bilaterally.  Gait and station: Gait is slightly wide-based, the patient walk independently, usually uses a cane.  Tandem gait is slightly unsteady.  Romberg is negative.  Reflexes: Deep tendon reflexes are symmetric and normal bilaterally. Toes are downgoing bilaterally.   Assessment/Plan:  1.  History of cerebrovascular disease, pontine stroke  2.  Dyslipidemia  3.  Mild diet-controlled diabetes  4.  Hypertension  The patient originally was referred here for headache which has resolved when she altered her medication, she believes that her headaches were related to the blue dye in her tablet.  The patient has had 1 event of positional vertigo on 26 December 2018, this has not recurred.  The patient has not been able to tolerate the statin drugs, she may be a candidate for IV cholesterol lowering agents.  Blood pressure control is important, given the history of diabetes, her systolic blood pressure should be under 130, if this is noted regularly at home, adjustment of blood pressure medication may be required.  The patient will follow-up here on an as-needed basis.  Jill Alexanders MD 02/05/2019 11:50 AM  [facility] [address] [place], [place] [address]  Phone [phone] Fax [phone]

## 2019-03-05 ENCOUNTER — Ambulatory Visit: Payer: Medicare Other | Admitting: Neurology

## 2019-03-07 ENCOUNTER — Ambulatory Visit: Payer: Medicare Other | Attending: Internal Medicine

## 2019-03-07 DIAGNOSIS — Z23 Encounter for immunization: Secondary | ICD-10-CM | POA: Insufficient documentation

## 2019-03-09 ENCOUNTER — Ambulatory Visit: Payer: Medicare Other | Admitting: Sports Medicine

## 2019-03-14 ENCOUNTER — Ambulatory Visit (INDEPENDENT_AMBULATORY_CARE_PROVIDER_SITE_OTHER): Payer: Medicare Other | Admitting: Sports Medicine

## 2019-03-14 ENCOUNTER — Encounter: Payer: Self-pay | Admitting: Sports Medicine

## 2019-03-14 ENCOUNTER — Other Ambulatory Visit: Payer: Self-pay

## 2019-03-14 DIAGNOSIS — Z8673 Personal history of transient ischemic attack (TIA), and cerebral infarction without residual deficits: Secondary | ICD-10-CM

## 2019-03-14 DIAGNOSIS — B351 Tinea unguium: Secondary | ICD-10-CM

## 2019-03-14 DIAGNOSIS — M79676 Pain in unspecified toe(s): Secondary | ICD-10-CM

## 2019-03-14 DIAGNOSIS — Z7901 Long term (current) use of anticoagulants: Secondary | ICD-10-CM

## 2019-03-14 DIAGNOSIS — E1140 Type 2 diabetes mellitus with diabetic neuropathy, unspecified: Secondary | ICD-10-CM

## 2019-03-14 NOTE — Progress Notes (Signed)
Subjective: Brandy Flowers is a 79 y.o. female patient with history of diabetes, not on any medications, who presents to office today complaining of long, painful nails while ambulating in shoes; unable to trim. Patient states that the glucose reading this morning was 110 and saw PCP Dr. Stana Bunting x Dec. with last A1c recorded at 6. Reports that her doctors are still working with trying to get her blood pressure under control and have been adjusting medications.  No other issues noted.  Patient Active Problem List   Diagnosis Date Noted  . Renal artery stenosis (HCC)   . Hyponatremia   . Labile blood pressure   . 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 Oropharyngeal dysphagia   . Slurred speech   . Diastolic dysfunction   . Dysphagia, post-stroke   . Hypokalemia   . Stroke (cerebrum) (HCC) 12/17/2017  . Anxiety state 12/17/2017  . 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 Left renal artery stenosis (HCC) 12/05/2014  . 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 Hypertension 09/20/2013  . Hypothyroid 09/20/2013   Current Outpatient Medications on File Prior to Visit  Medication Sig Dispense Refill  . ACCU-CHEK GUIDE test strip CHECK BLOOD SUGAR DAILY DX  E11.9 50    . aspirin 325 MG tablet Take 1 tablet (325 mg total) by mouth daily.    Marland Kitchen atenolol (TENORMIN) 25 MG tablet Take 1 tablet (25 mg total) by mouth daily. (Patient taking differently: Take 50 mg by mouth 2 (two) times daily. )    . diclofenac (FLECTOR) 1.3 % PTCH SMARTSIG:1 Patch(s) Topical Twice Daily PRN    . diclofenac sodium (VOLTAREN) 1 % GEL APPLY 2 GRAMS TO AFFECT JOINT WITH PAIN FOUR TIMES A DAY AS NEEDED FOR 30 DAYS    . gabapentin (NEURONTIN) 100 MG capsule Take 1 capsule (100 mg total) by mouth at bedtime. 3 capsule 0  . lidocaine (LIDODERM) 5 % APPLY 1 PATCH TO INTACT SKIN REMOVE AFTER 12 HOURS ONCE A DAY TO NECK AS NEEDED    . LORazepam (ATIVAN) 1 MG tablet Take 1 mg by  mouth 3 (three) times daily as needed.    Marland Kitchen losartan (COZAAR) 25 MG tablet Take 25 mg by mouth 2 (two) times daily.     Marland Kitchen SYNTHROID 50 MCG tablet      No current facility-administered medications on file prior to visit.   Allergies  Allergen Reactions  . Amiloride Shortness Of Breath  . Blue Dyes (Parenteral) Shortness Of Breath and Rash  . Sulfa Antibiotics Anaphylaxis  . Ace Inhibitors Nausea And Vomiting  . Norvasc [Amlodipine] Nausea And Vomiting  . Calcium Channel Blockers Diarrhea  . Catapres [Clonidine Hcl]     Decreased mental clarity   . Plavix [Clopidogrel Bisulfate]     Elevated liver enzymes  . Plavix [Clopidogrel]     Increased LFT's   . Tramadol     Nausea as a dog per pt  . Zetia [Ezetimibe]     Liver  Function increase labs  . Amoxicillin Diarrhea  . Chlorthalidone     Dizziness.   . Doxycycline Diarrhea  . Fosamax [Alendronate Sodium]     Pt's teeth fell out while taking.   . Lexapro [Escitalopram]     Brain Fog   . Spironolactone Palpitations  . Statins     Upset stomach     Recent Results (from the past 2160 hour(s))  CBC     Status: Abnormal   Collection  Time: 12/26/18  9:44 AM  Result Value Ref Range   WBC 7.8 4.0 - 10.5 K/uL   RBC 5.26 (H) 3.87 - 5.11 MIL/uL   Hemoglobin 15.2 (H) 12.0 - 15.0 g/dL   HCT 45.6 36.0 - 46.0 %   MCV 86.7 80.0 - 100.0 fL   MCH 28.9 26.0 - 34.0 pg   MCHC 33.3 30.0 - 36.0 g/dL   RDW 12.8 11.5 - 15.5 %   Platelets 256 150 - 400 K/uL   nRBC 0.0 0.0 - 0.2 %    Comment: Performed at [facility], [place] [address], [place], [place] [address]  CBG monitoring, ED     Status: None   Collection Time: 12/26/18  1:59 PM  Result Value Ref Range   Glucose-Capillary 90 70 - 99 mg/dL  Basic metabolic panel     Status: Abnormal   Collection Time: 12/26/18  2:20 PM  Result Value Ref Range   Sodium 139 135 - 145 mmol/L   Potassium 5.3 (H) 3.5 - 5.1 mmol/L    Comment: HEMOLYSIS AT THIS LEVEL MAY AFFECT RESULT   Chloride 105  98 - 111 mmol/L   CO2 23 22 - 32 mmol/L   Glucose, Bld 95 70 - 99 mg/dL   BUN 28 (H) 8 - 23 mg/dL   Creatinine, Ser 0.74 0.44 - 1.00 mg/dL   Calcium 9.1 8.9 - 10.3 mg/dL   GFR calc non Af Amer >60 >60 mL/min   GFR calc Af Amer >60 >60 mL/min   Anion gap 11 5 - 15    Comment: Performed at [facility], [place] [address] [place], [place] [address]  Hepatic function panel     Status: None   Collection Time: 12/26/18  3:52 PM  Result Value Ref Range   Total Protein 6.9 6.5 - 8.1 g/dL   Albumin 3.8 3.5 - 5.0 g/dL   AST 24 15 - 41 U/L   ALT 22 0 - 44 U/L   Alkaline Phosphatase 60 38 - 126 U/L   Total Bilirubin 1.0 0.3 - 1.2 mg/dL   Bilirubin, Direct <0.1 0.0 - 0.2 mg/dL   Indirect Bilirubin NOT CALCULATED 0.3 - 0.9 mg/dL    Comment: Performed at [facility] [address] [place], [place] [address]  Ethanol     Status: None   Collection Time: 12/26/18  3:52 PM  Result Value Ref Range   Alcohol, Ethyl (B) <10 <10 mg/dL    Comment: (NOTE) Lowest detectable limit for serum alcohol is 10 mg/dL. For medical purposes only. Performed at [facility], [place] [address], [place],  [address]     Objective: General: Patient is awake, alert and in no acute distress.  Using rolling walker.  Integument: Skin is warm, dry and supple bilateral. Nails are tender, long, thickened and dystrophic with subungual debris, consistent with onychomycosis, 1-5 bilateral. No signs of infection. No open lesions or preulcerative lesions present bilateral. Small pinch callus at distal tuft of left 4th toe with no signs of infection.  Remaining integument unremarkable.  Vasculature:  Dorsalis Pedis pulse 1/4 bilateral. Posterior Tibial pulse  1/4 bilateral. Capillary fill time <3 sec 1-5 bilateral. Positive hair growth to the level of the digits.Temperature gradient within normal limits. No varicosities present bilateral. No edema present bilateral.   Neurology: The patient has intact  sensation measured with a 5.07/10g Semmes Weinstein Monofilament at all pedal sites bilateral . Vibratory sensation diminished bilateral with  tuning fork. No Babinski sign present bilateral.   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  Strength acceptable for patient status.  No tenderness with calf compression bilateral.  Assessment and Plan: Problem List Items Addressed This Visit      Endocrine   Type 2 diabetes, controlled, with neuropathy (HCC)    Other Visit Diagnoses    Pain due to onychomycosis of toenail    -  Primary   Anticoagulant long-term use       History of stroke          -Examined patient. -Discussed and educated patient on diabetic foot care, especially with  regards to the vascular, neurological and musculoskeletal systems.  -Mechanically debrided all nails 1-5 bilateral using sterile nail nipper and filed with dremel without incident   -Smoothed callus using bur at tip of left 4th toe without incident  -Continue with toe cushion for left fourth toe as needed -Continue with cane or walker for stability in gait secondary to stroke -Patient to return in 2.5 to 3 months for at risk foot care -Patient advised to call the office if any problems or questions arise in the meantime.  Asencion Islam, DPM

## 2019-03-28 ENCOUNTER — Ambulatory Visit: Payer: Medicare Other | Attending: Internal Medicine

## 2019-03-28 DIAGNOSIS — Z23 Encounter for immunization: Secondary | ICD-10-CM | POA: Insufficient documentation

## 2019-03-28 NOTE — Progress Notes (Signed)
   Covid-19 Vaccination Clinic  Name:  Brandy Flowers    MRN: [id number] DOB: [birth year]  03/28/2019  [name] was observed post Covid-19 immunization for 15 minutes without incidence. She was provided with Vaccine Information Sheet and instruction to access the V-Safe system.   [name] was instructed to call 911 with any severe reactions post vaccine: Marland Kitchen Difficulty breathing  . Swelling of your face and throat  . A fast heartbeat  . A bad rash all over your body  . Dizziness and weakness    Immunizations Administered    Name Date Dose VIS Date Route   Pfizer COVID-19 Vaccine 03/28/2019  1:13 PM 0.3 mL 01/26/2019 Intramuscular   Manufacturer: [employer], [employer]   Lot: [id number]   NDC: [address]-6

## 2019-06-08 ENCOUNTER — Other Ambulatory Visit: Payer: Self-pay

## 2019-06-08 ENCOUNTER — Ambulatory Visit (INDEPENDENT_AMBULATORY_CARE_PROVIDER_SITE_OTHER): Payer: Medicare Other | Admitting: Sports Medicine

## 2019-06-08 ENCOUNTER — Encounter: Payer: Self-pay | Admitting: Sports Medicine

## 2019-06-08 DIAGNOSIS — Z8673 Personal history of transient ischemic attack (TIA), and cerebral infarction without residual deficits: Secondary | ICD-10-CM

## 2019-06-08 DIAGNOSIS — B351 Tinea unguium: Secondary | ICD-10-CM | POA: Diagnosis not present

## 2019-06-08 DIAGNOSIS — M79676 Pain in unspecified toe(s): Secondary | ICD-10-CM | POA: Diagnosis not present

## 2019-06-08 DIAGNOSIS — Z7901 Long term (current) use of anticoagulants: Secondary | ICD-10-CM | POA: Diagnosis not present

## 2019-06-08 DIAGNOSIS — E1140 Type 2 diabetes mellitus with diabetic neuropathy, unspecified: Secondary | ICD-10-CM | POA: Diagnosis not present

## 2019-06-08 NOTE — Progress Notes (Signed)
Subjective: Brandy Flowers is a 79 y.o. female patient with history of diabetes, not on any medications, who presents to office today complaining of long, painful nails while ambulating in shoes; unable to trim. Patient states that the glucose reading this morning was 121 and saw PCP Dr. Verdell Face x February. with last A1c recorded at 6. No other pedal complaints.   Patient Active Problem List   Diagnosis Date Noted  . Renal artery stenosis ([place])   . Hyponatremia   . Labile blood pressure   . 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 ([place]) 12/20/2017  . Oropharyngeal dysphagia   . Slurred speech   . Diastolic dysfunction   . Dysphagia, post-stroke   . Hypokalemia   . Stroke (cerebrum) ([place]) 12/17/2017  . Anxiety state 12/17/2017  . Splenic artery aneurysm ([place]) 12/05/2014  . Left renal artery stenosis ([place]) 12/05/2014  . Type 2 diabetes, controlled, with neuropathy ([place]) 09/20/2013  . Hypertension 09/20/2013  . Hypothyroid 09/20/2013   Current Outpatient Medications on File Prior to Visit  Medication Sig Dispense Refill  . ACCU-CHEK GUIDE test strip CHECK BLOOD SUGAR DAILY DX  E11.9 50    . aspirin 325 MG tablet Take 1 tablet (325 mg total) by mouth daily.    Marland Kitchen atenolol (TENORMIN) 25 MG tablet Take 1 tablet (25 mg total) by mouth daily. (Patient taking differently: Take 50 mg by mouth 2 (two) times daily. )    . diclofenac (FLECTOR) 1.3 % PTCH SMARTSIG:1 Patch(s) Topical Twice Daily PRN    . diclofenac sodium (VOLTAREN) 1 % GEL APPLY 2 GRAMS TO AFFECT JOINT WITH PAIN FOUR TIMES A DAY AS NEEDED FOR 30 DAYS    . gabapentin (NEURONTIN) 100 MG capsule Take 1 capsule (100 mg total) by mouth at bedtime. 3 capsule 0  . lidocaine (LIDODERM) 5 % APPLY 1 PATCH TO INTACT SKIN REMOVE AFTER 12 HOURS ONCE A DAY TO NECK AS NEEDED    . LORazepam (ATIVAN) 1 MG tablet Take 1 mg by mouth 3 (three) times daily as needed.    Marland Kitchen losartan (COZAAR) 25 MG tablet Take 25 mg by mouth 2 (two) times daily.     Marland Kitchen  SYNTHROID 50 MCG tablet      No current facility-administered medications on file prior to visit.   Allergies  Allergen Reactions  . Amiloride Shortness Of Breath  . Blue Dyes (Parenteral) Shortness Of Breath and Rash  . Sulfa Antibiotics Anaphylaxis  . Ace Inhibitors Nausea And Vomiting  . Norvasc [Amlodipine] Nausea And Vomiting  . Calcium Channel Blockers Diarrhea  . Catapres [Clonidine Hcl]     Decreased mental clarity   . Plavix [Clopidogrel Bisulfate]     Elevated liver enzymes  . Plavix [Clopidogrel]     Increased LFT's   . Tramadol     Nausea as a dog per pt  . Zetia [Ezetimibe]     Liver  Function increase labs  . Amoxicillin Diarrhea  . Chlorthalidone     Dizziness.   . Doxycycline Diarrhea  . Fosamax [Alendronate Sodium]     Pt's teeth fell out while taking.   . Lexapro [Escitalopram]     Brain Fog   . Spironolactone Palpitations  . Statins     Upset stomach     No results found for this or any previous visit (from the past 2160 hour(s)).  Objective: General: Patient is awake, alert and in no acute distress.  Using rolling walker.  Integument: Skin is warm, dry and supple bilateral.  Nails are tender, long, thickened and dystrophic with subungual debris, consistent with onychomycosis, 1-5 bilateral. No signs of infection. No open lesions or preulcerative lesions present bilateral. Small pinch callus at distal tuft of left 4th toe with no signs of infection.  Remaining integument unremarkable.  Vasculature:  Dorsalis Pedis pulse 1/4 bilateral. Posterior Tibial pulse  1/4 bilateral. Capillary fill time <3 sec 1-5 bilateral. Positive hair growth to the level of the digits.Temperature gradient within normal limits. No varicosities present bilateral. No edema present bilateral.   Neurology: The patient has intact sensation measured with a 5.07/10g Semmes Weinstein Monofilament at all pedal sites bilateral . Vibratory sensation diminished bilateral with tuning fork. No  Babinski sign present bilateral.   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  Strength acceptable for patient status.  No tenderness with calf compression bilateral.  Assessment and Plan: Problem List Items Addressed This Visit      Endocrine   Type 2 diabetes, controlled, with neuropathy (HCC)    Other Visit Diagnoses    Pain due to onychomycosis of toenail    -  Primary   Anticoagulant long-term use       History of stroke          -Examined patient. -re-Discussed and educated patient on diabetic foot care, especially with regards to the vascular, neurological and musculoskeletal systems.  -Mechanically debrided all nails 1-5 bilateral using sterile nail nipper and filed with dremel without incident   -Smoothed callus using bur at tip of left 4th toe without incident  -Patient to return in 2.5 to 3 months for at risk foot care -Patient advised to call the office if any problems or questions arise in the meantime.  Asencion Islam, DPM

## 2019-08-16 ENCOUNTER — Other Ambulatory Visit: Payer: Self-pay | Admitting: *Deleted

## 2019-08-16 DIAGNOSIS — I701 Atherosclerosis of renal artery: Secondary | ICD-10-CM

## 2019-08-22 ENCOUNTER — Ambulatory Visit: Payer: Medicare Other

## 2019-08-22 ENCOUNTER — Encounter (HOSPITAL_COMMUNITY): Payer: Medicare Other

## 2019-08-29 ENCOUNTER — Other Ambulatory Visit: Payer: Self-pay

## 2019-08-29 ENCOUNTER — Ambulatory Visit (INDEPENDENT_AMBULATORY_CARE_PROVIDER_SITE_OTHER): Payer: Medicare Other | Admitting: Physician Assistant

## 2019-08-29 ENCOUNTER — Ambulatory Visit (HOSPITAL_COMMUNITY)
Admission: RE | Admit: 2019-08-29 | Discharge: 2019-08-29 | Disposition: A | Discharge status: Home / Self Care | Payer: Medicare Other | Source: Ambulatory Visit | Attending: Surgery | Admitting: Surgery

## 2019-08-29 VITALS — BP 159/65 | HR 58 | Temp 97.1°F | Resp 20 | Ht 61.0 in | Wt 108.5 lb

## 2019-08-29 DIAGNOSIS — I701 Atherosclerosis of renal artery: Secondary | ICD-10-CM | POA: Insufficient documentation

## 2019-08-29 NOTE — Progress Notes (Signed)
HISTORY AND PHYSICAL     CC:  follow up Requesting Provider:  Philemon Kingdom, MD  HPI: Brandy Flowers is a 79 y.o. (12/23/1940) female who presents with hx of RAS.  She was last seen by Dr. Darrick Penna in June 2020 via telephone visit.  At that time, her BP was fairly well controlled.    She would occasionally have systolic BP in the 140's-150's range, but usually stayed below this.  She was on asa.  She was taken off her Plavix due to concerns about liver function.  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  She had good control of her DM.  She was walking on a regular basis, which was helpful for her mental status and physical well being.  She comes in today for follow up.  She states that her blood pressure is elevated today but mostly runs in the 120's at home.  She states it has been higher with her thyroid issues.  She states she is now on Olmesartan daily and atenolol twice daily with good control.  She states that she has left leg pain and was told it was due to her stroke.  She does not have any non healing wounds.    Pt states she graduated from [employer] with 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    The pt is not on a statin for cholesterol management.  allergy The pt is on a daily aspirin.   Other AC:  none The pt is on BB and ARB for hypertension.   The pt is  diabetic.   Tobacco hx:  never  Pt does not have family hx of AAA.  Past Medical History:  Diagnosis Date  . Anxiety   . Arthritis   . Diabetes mellitus without complication (HCC)   . Hypertension   . Sinus problem   . Stroke (HCC)   . Thyroid disease     Past Surgical History:  Procedure Laterality Date  . BREAST SURGERY    . CHOLECYSTECTOMY    . EYE SURGERY      Social History   Socioeconomic History  . Marital status: Widowed    Spouse name: Not on file  . Number of children: Not on file    . Years of education: Not on file  . Highest education level: Not on file  Occupational History  . Not on file  Tobacco Use  . Smoking status: Never Smoker  . Smokeless tobacco: Never Used  Vaping Use  . Vaping Use: Never used  Substance and Sexual Activity  . Alcohol use: No  . Drug use: No  . Sexual activity: Not Currently  Other Topics Concern  . Not on file  Social History Narrative  . Not on file   Social Determinants of Health   Financial Resource Strain:   . Difficulty of Paying Living Expenses:   Food Insecurity:   . Worried About Programme researcher, broadcasting/film/video in the Last Year:   . Barista in the Last Year:   Transportation Needs:   . Freight forwarder (Medical):   Marland Kitchen Lack of Transportation (Non-Medical):   Physical Activity:   . Days of Exercise per Week:   . Minutes of Exercise per Session:   Stress:   . Feeling of Stress :   Social Connections:   . Frequency of Communication with Friends and  Family:   . Frequency of Social Gatherings with Friends and Family:   . Attends Religious Services:   . Active Member of Clubs or Organizations:   . Attends Banker Meetings:   Marland Kitchen Marital Status:   Intimate Partner Violence:   . Fear of Current or Ex-Partner:   . Emotionally Abused:   Marland Kitchen Physically Abused:   . Sexually Abused:     Family History  Problem Relation Age of Onset  . Stroke Mother   . Diabetes Mother   . Heart disease Mother        Before age 21  . Hypertension Mother   . Hyperlipidemia Mother   . Heart disease Father   . Heart attack Father   . Hypertension Father   . Heart disease Brother        After age 6- CABG  . Hypertension Brother     Current Outpatient Medications  Medication Sig Dispense Refill  . ACCU-CHEK GUIDE test strip CHECK BLOOD SUGAR DAILY DX  E11.9 50    . aspirin 325 MG tablet Take 1 tablet (325 mg total) by mouth daily.    Marland Kitchen atenolol (TENORMIN) 25 MG tablet Take 1 tablet (25 mg total) by mouth daily.  (Patient taking differently: Take 50 mg by mouth 2 (two) times daily. )    . diclofenac (FLECTOR) 1.3 % PTCH SMARTSIG:1 Patch(s) Topical Twice Daily PRN    . diclofenac sodium (VOLTAREN) 1 % GEL APPLY 2 GRAMS TO AFFECT JOINT WITH PAIN FOUR TIMES A DAY AS NEEDED FOR 30 DAYS    . gabapentin (NEURONTIN) 100 MG capsule Take 1 capsule (100 mg total) by mouth at bedtime. 3 capsule 0  . lidocaine (LIDODERM) 5 % APPLY 1 PATCH TO INTACT SKIN REMOVE AFTER 12 HOURS ONCE A DAY TO NECK AS NEEDED    . LORazepam (ATIVAN) 1 MG tablet Take 1 mg by mouth 3 (three) times daily as needed.    Marland Kitchen losartan (COZAAR) 25 MG tablet Take 25 mg by mouth 2 (two) times daily.     Marland Kitchen SYNTHROID 50 MCG tablet      No current facility-administered medications for this visit.    Allergies  Allergen Reactions  . Amiloride Shortness Of Breath  . Blue Dyes (Parenteral) Shortness Of Breath and Rash  . Sulfa Antibiotics Anaphylaxis  . Ace Inhibitors Nausea And Vomiting  . Norvasc [Amlodipine] Nausea And Vomiting  . Calcium Channel Blockers Diarrhea  . Catapres [Clonidine Hcl]     Decreased mental clarity   . Plavix [Clopidogrel Bisulfate]     Elevated liver enzymes  . Plavix [Clopidogrel]     Increased LFT's   . Tramadol     Nausea as a dog per pt  . Zetia [Ezetimibe]     Liver  Function increase labs  . Amoxicillin Diarrhea  . Chlorthalidone     Dizziness.   . Doxycycline Diarrhea  . Fosamax [Alendronate Sodium]     Pt's teeth fell out while taking.   . Lexapro [Escitalopram]     Brain Fog   . Spironolactone Palpitations  . Statins     Upset stomach      REVIEW OF SYSTEMS:   [X]  denotes positive finding, [ ]  denotes negative finding Cardiac  Comments:  Chest pain or chest pressure:    Shortness of breath upon exertion:    Short of breath when lying flat:    Irregular heart rhythm:        Vascular  Pain in calf, thigh, or hip brought on by ambulation:    Pain in feet at night that wakes you up from  your sleep:     Blood clot in your veins:    Leg swelling:         Pulmonary    Oxygen at home:    Productive cough:     Wheezing:         Neurologic    Sudden weakness in arms or legs:     Sudden numbness in arms or legs:     Sudden onset of difficulty speaking or slurred speech:    Temporary loss of vision in one eye:     Problems with dizziness:  x       Gastrointestinal    Blood in stool:     Vomited blood:         Genitourinary    Burning when urinating:     Blood in urine:        Psychiatric    Major depression:         Hematologic    Bleeding problems:    Problems with blood clotting too easily:        Skin    Rashes or ulcers:        Constitutional    Fever or chills:      PHYSICAL EXAMINATION:  Today's Vitals   08/29/19 1001  BP: (!) 159/65  Pulse: (!) 58  Resp: 20  Temp: (!) 97.1 F (36.2 C)  TempSrc: Temporal  SpO2: 96%  Weight: 108 lb 8 oz (49.2 kg)  Height: 5\' 1"  (1.549 m)   Body mass index is 20.5 kg/m.   General:  WDWN in NAD; vital signs documented above Gait: Not observed HENT: WNL, normocephalic Pulmonary: normal non-labored breathing Cardiac: regular HR, without  Murmur without carotid bruits Abdomen: soft, NT, no masses Skin: without rashes Vascular Exam/Pulses:  Right Left  Radial 2+ (normal) 2+ (normal)  Popliteal Unable to palpate  Unable to palpate   DP 2+ (normal) 2+ (normal)  PT 2+ (normal) 2+ (normal)   Extremities: without ischemic changes, without Gangrene , without cellulitis; without open wounds;  Musculoskeletal: no muscle wasting or atrophy  Neurologic: A&O X 3;  No focal weakness or paresthesias are detected Psychiatric:  The pt has Normal affect.   Non-Invasive Vascular Imaging on 08/29/2019:   Renal artery duplex 08/29/2019: Right: RRV flow present. 1-59% stenosis of the right renal artery.  Left: LRV flow present. 1-59% stenosis of the left renal artery    ASSESSMENT/PLAN:: 79 y.o. female here for  follow up for renal artery stenosis   -pt doing well with reasonably controlled blood pressure.  Her renal artery duplex today is essentially unchanged from previous with 1-59% stenosis bilaterally.   Dr. 62 would only consider intervention with velocities indicating stenosis greater than 70% with additions of multiple blood pressure medication or decline in renal function. -she will f/u in one year with renal artery duplex and see PA on Dr. Darrick Penna clinic day.  -left leg pain-pt has easily palpable pedal pulses bilaterally.   -she will call sooner should she have any issues before then.   Darrick Penna, [facility] [phone]  Clinic MD:   [phone]

## 2019-09-07 ENCOUNTER — Ambulatory Visit (INDEPENDENT_AMBULATORY_CARE_PROVIDER_SITE_OTHER): Payer: Medicare Other | Admitting: Sports Medicine

## 2019-09-07 ENCOUNTER — Encounter: Payer: Self-pay | Admitting: Sports Medicine

## 2019-09-07 ENCOUNTER — Other Ambulatory Visit: Payer: Self-pay

## 2019-09-07 DIAGNOSIS — Z8673 Personal history of transient ischemic attack (TIA), and cerebral infarction without residual deficits: Secondary | ICD-10-CM

## 2019-09-07 DIAGNOSIS — Z7901 Long term (current) use of anticoagulants: Secondary | ICD-10-CM | POA: Diagnosis not present

## 2019-09-07 DIAGNOSIS — M79676 Pain in unspecified toe(s): Secondary | ICD-10-CM

## 2019-09-07 DIAGNOSIS — E1140 Type 2 diabetes mellitus with diabetic neuropathy, unspecified: Secondary | ICD-10-CM

## 2019-09-07 DIAGNOSIS — B351 Tinea unguium: Secondary | ICD-10-CM | POA: Diagnosis not present

## 2019-09-07 NOTE — Progress Notes (Signed)
Subjective: Brandy Flowers is a 79 y.o. female patient with history of diabetes, not on any medications, who presents to office today complaining of long, painful nails while ambulating in shoes; unable to trim. Patient states that the glucose reading this morning was 130 and saw PCP Dr. Stana Bunting x 1 week ago with last A1c recorded at 6.0. No other pedal complaints.   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     aspirin 325 MG tablet Take 1 tablet (325 mg total) by mouth daily.     atenolol (TENORMIN) 25 MG tablet Take 1 tablet (25 mg total) by mouth daily. (Patient taking differently: Take 50 mg by mouth 2 (two) times daily. )     gabapentin (NEURONTIN) 100 MG capsule Take 1 capsule (100 mg total) by mouth at bedtime. 3 capsule 0   lidocaine (LIDODERM) 5 % APPLY 1 PATCH TO INTACT SKIN REMOVE AFTER 12 HOURS ONCE A DAY TO NECK AS NEEDED     LORazepam (ATIVAN) 1 MG tablet Take 1 mg by mouth 3 (three) times daily as needed.     losartan (COZAAR) 25 MG tablet Take 25 mg by mouth 2 (two) times daily.      olmesartan (BENICAR) 40 MG tablet Take 40 mg by mouth daily.     SYNTHROID 50 MCG tablet      No  current facility-administered medications on file prior to visit.   Allergies  Allergen Reactions   Amiloride Shortness Of Breath   Blue Dyes (Parenteral) Shortness Of Breath and Rash   Sulfa Antibiotics Anaphylaxis   Ace Inhibitors Nausea And Vomiting   Norvasc [Amlodipine] Nausea And Vomiting   Calcium Channel Blockers Diarrhea   Catapres [Clonidine Hcl]     Decreased mental clarity    Plavix [Clopidogrel Bisulfate]     Elevated liver enzymes   Plavix [Clopidogrel]     Increased LFT's    Tramadol     Nausea as a dog per pt   Zetia [Ezetimibe]     Liver  Function increase labs   Amoxicillin Diarrhea   Chlorthalidone     Dizziness.    Doxycycline Diarrhea   Fosamax [Alendronate Sodium]     Pt's teeth fell out while taking.    Lexapro [Escitalopram]     Brain Fog    Spironolactone Palpitations   Statins     Upset stomach     No results found for this or any previous visit (from the past 2160 hour(s)).  Objective: General: Patient is awake, alert and in no acute distress.  Using rolling walker.  Integument: Skin is warm, dry and supple bilateral. Nails are tender, long, thickened and  dystrophic with subungual debris, consistent with onychomycosis, 1-5 bilateral. No signs of infection. No open lesions or preulcerative lesions present bilateral. Small pinch callus at distal tuft of left 4th toe with no signs of infection.  Remaining integument unremarkable.  Vasculature:  Dorsalis Pedis pulse 1/4 bilateral. Posterior Tibial pulse  1/4 bilateral. Capillary fill time <3 sec 1-5 bilateral. Positive hair growth to the level of the digits.Temperature gradient within normal limits. No varicosities present bilateral. No edema present bilateral.   Neurology: The patient has intact sensation measured with a 5.07/10g Semmes Weinstein Monofilament at all pedal sites bilateral . Vibratory sensation diminished bilateral with tuning fork. No Babinski sign present bilateral.    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  Strength acceptable for patient status.  No tenderness with calf compression bilateral.  Assessment and Plan: Problem List Items Addressed This Visit      Endocrine   Type 2 diabetes, controlled, with neuropathy (HCC)    Other Visit Diagnoses    Pain due to onychomycosis of toenail    -  Primary   Anticoagulant long-term use       History of stroke          -Examined patient. -Discussed and educated patient on diabetic foot care, especially with regards to the vascular, neurological and musculoskeletal systems.  -Mechanically debrided all nails 1-5 bilateral using sterile nail nipper and filed with dremel without incident   -Smoothed callus using bur at tip of left 4th toe without incident  -Patient to return in 2.5 to 3 months for at risk foot care -Patient advised to call the office if any problems or questions arise in the meantime.  Asencion Islam, DPM

## 2019-09-27 IMAGING — RF DG SWALLOWING FUNCTION - NRPT [facility]
13 of 24 series · 13 of 24 positions shown · non-contrast
Comparison: none

[Series 1: run · 1 of 19 frames shown (1 of 13)]
[frame 1/19]
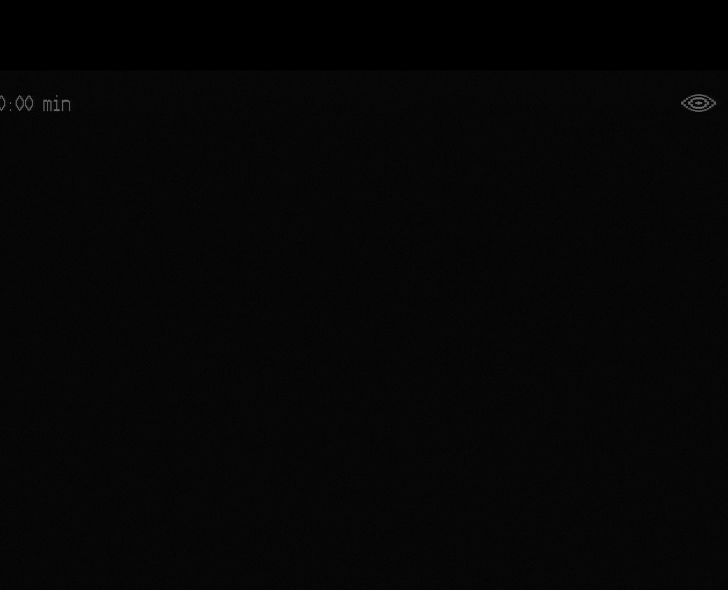

[Series 3: run · 1 of 15 frames shown (2 of 13)]
[frame 3/15]
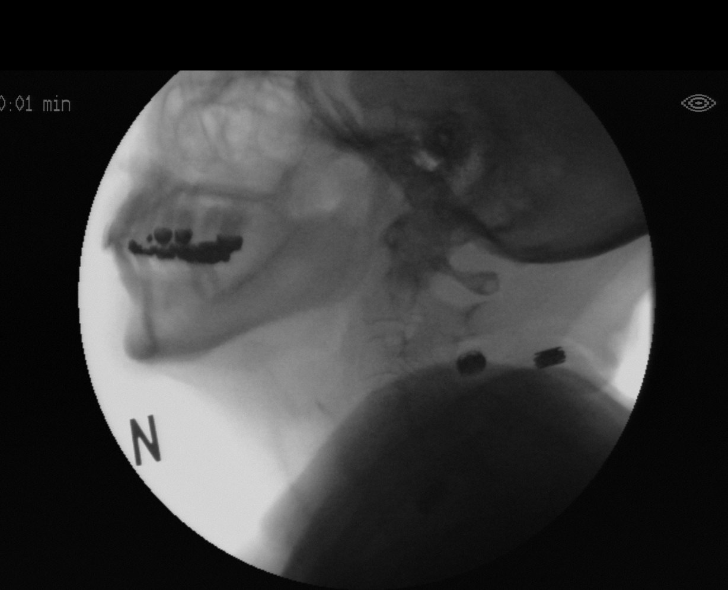

[Series 5: run · 1 of 106 frames shown (3 of 13)]
[frame 16/106]
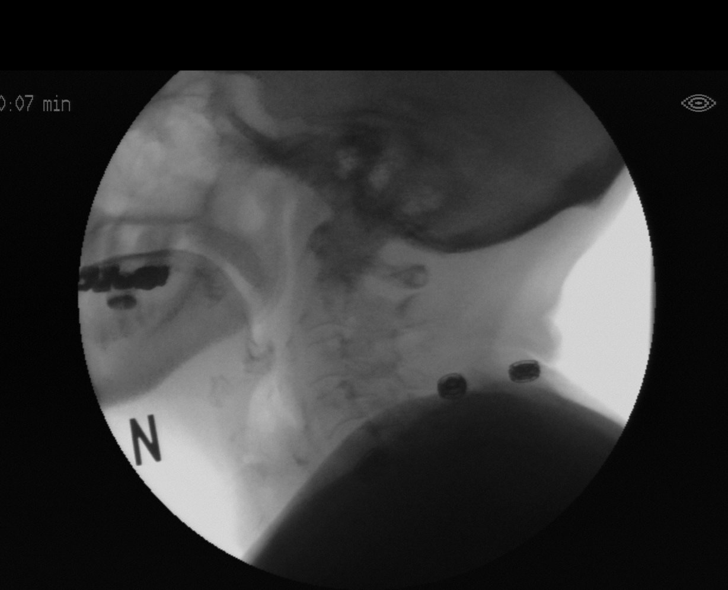

[Series 7: run · 1 of 27 frames shown (4 of 13)]
[frame 11/27]
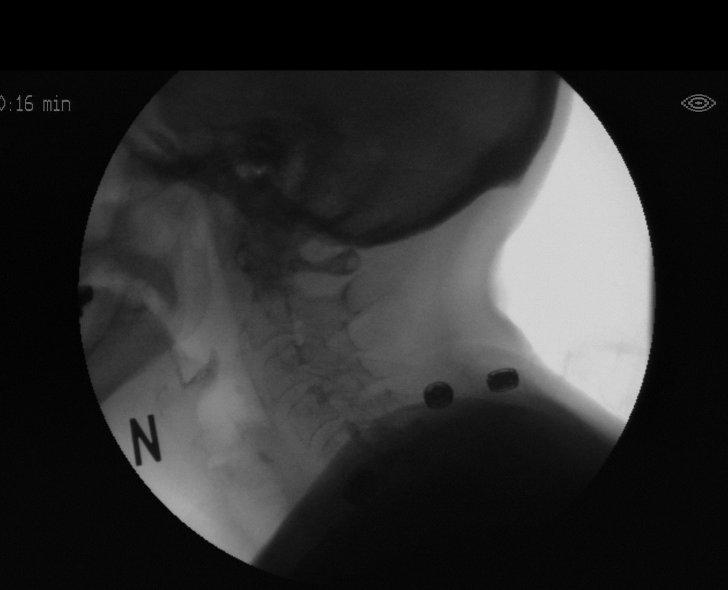

[Series 9: run · 1 of 147 frames shown (5 of 13)]
[frame 55/147]
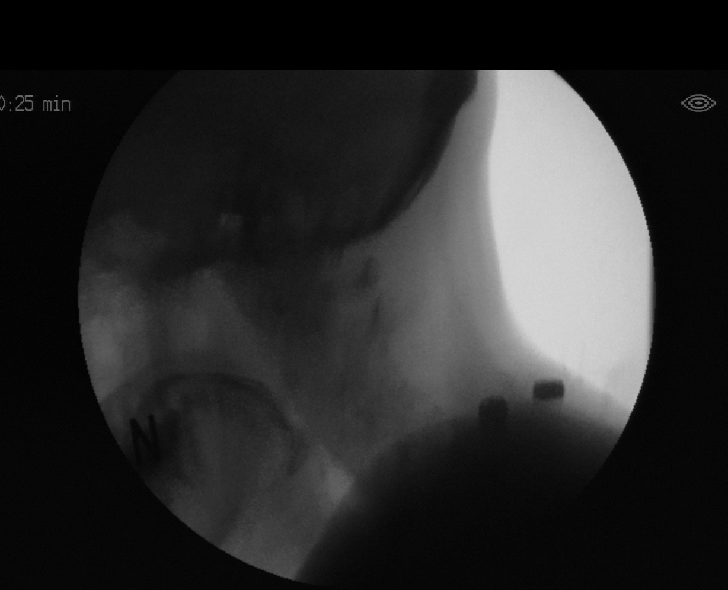

[Series 11: run · 1 of 104 frames shown (6 of 13)]
[frame 16/104]
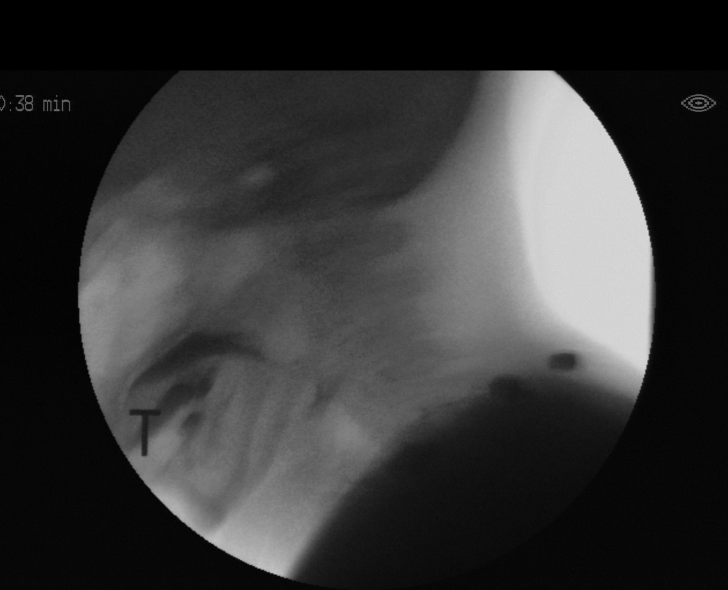

[Series 13: run · 1 of 200 frames shown (7 of 13)]
[frame 101/200]
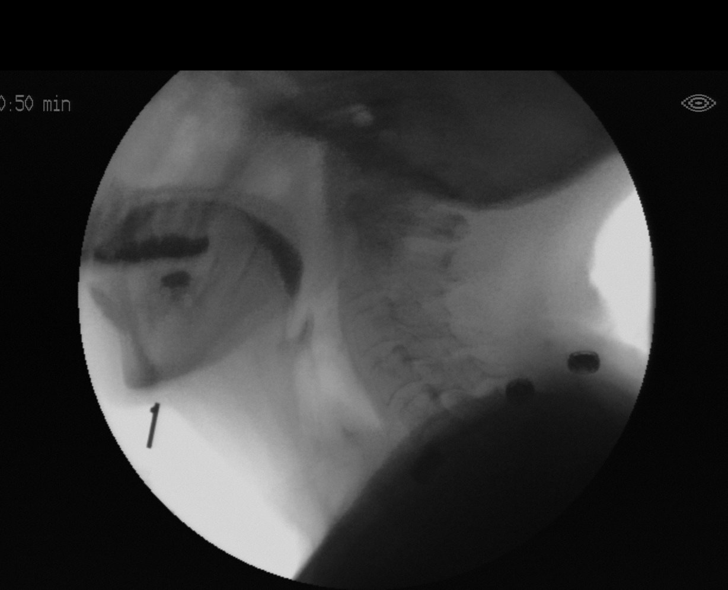

[Series 14: run · 1 of 134 frames shown (8 of 13)]
[frame 114/134]
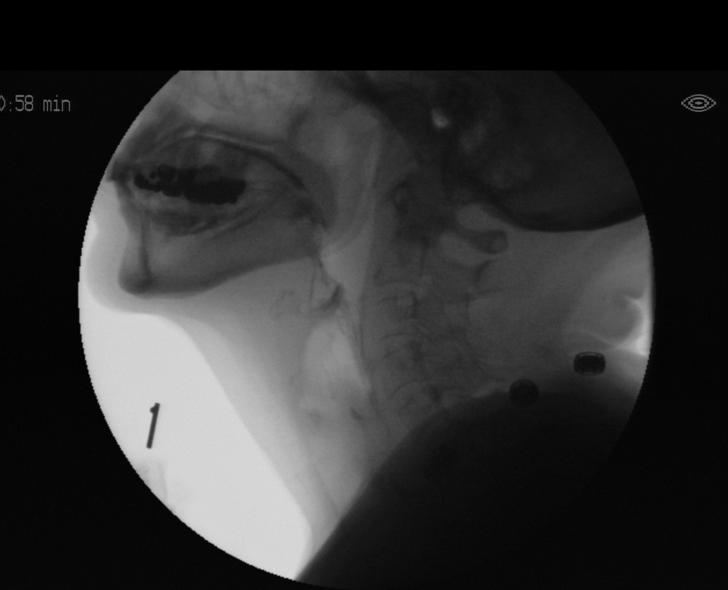

[Series 16: run · 1 of 504 frames shown (9 of 13)]
[frame 253/504]
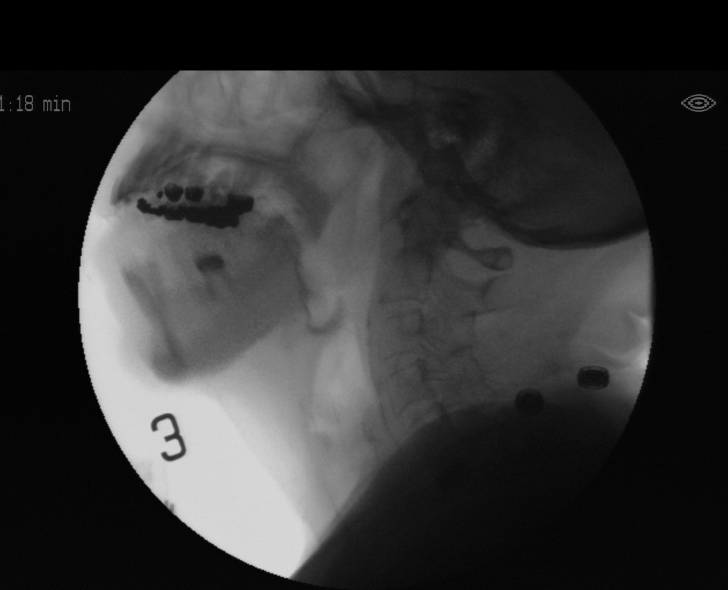

[Series 18: run · 1 of 244 frames shown (10 of 13)]
[frame 208/244]
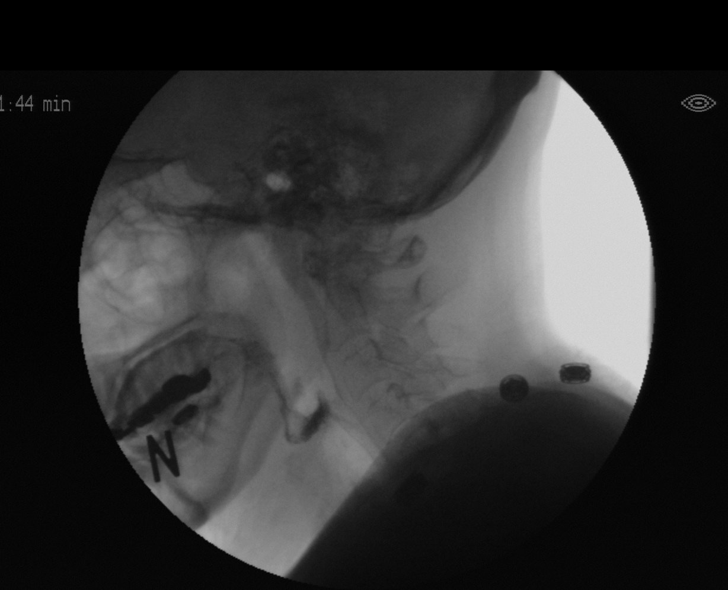

[Series 20: run · 1 of 431 frames shown (11 of 13)]
[frame 216/431]
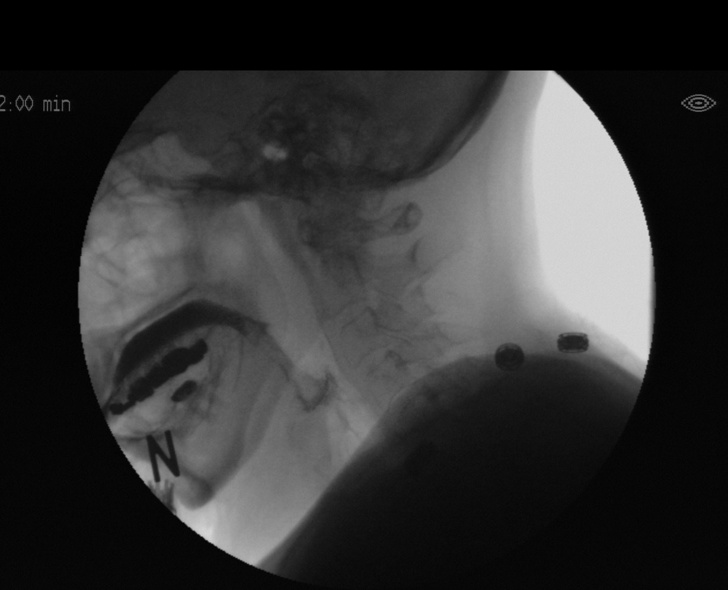

[Series 22: run · 1 of 63 frames shown (12 of 13)]
[frame 62/63]
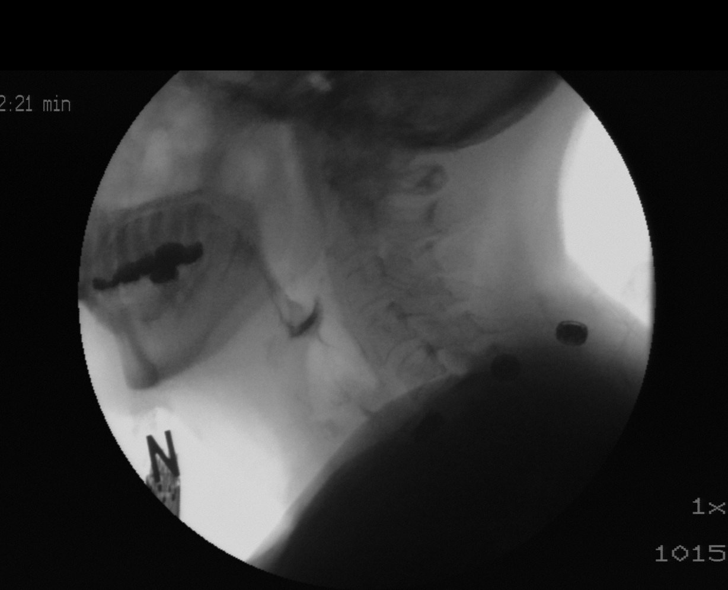

[Series 24: run · 1 of 184 frames shown (13 of 13)]
[frame 157/184]
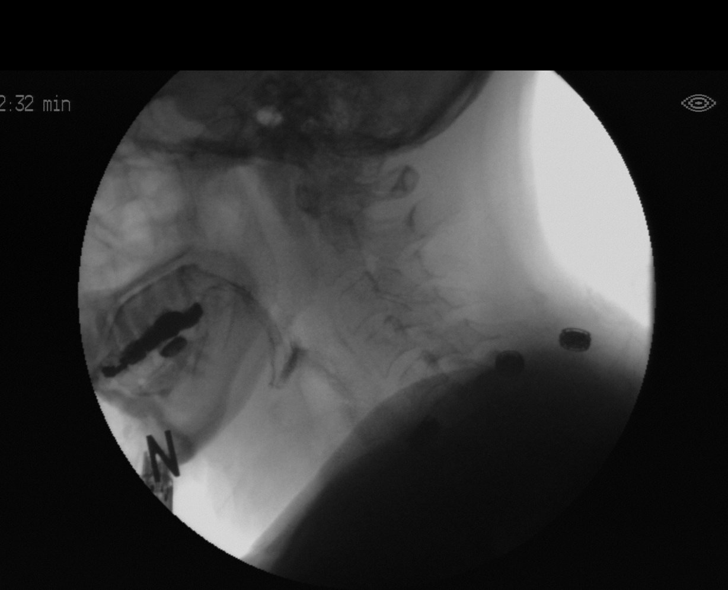

[13 of 24 positions shown; findings below may reference images not displayed]

FLUOROSCOPY FOR SWALLOWING FUNCTION STUDY:
Fluoroscopy was provided for swallowing function study, which was administered by a speech pathologist.  Final results and recommendations from this study are contained within the speech pathology report.

## 2019-10-02 ENCOUNTER — Encounter: Payer: Self-pay | Admitting: Cardiology

## 2019-10-04 DIAGNOSIS — E119 Type 2 diabetes mellitus without complications: Secondary | ICD-10-CM | POA: Insufficient documentation

## 2019-10-04 DIAGNOSIS — J349 Unspecified disorder of nose and nasal sinuses: Secondary | ICD-10-CM | POA: Insufficient documentation

## 2019-10-04 DIAGNOSIS — M1990 Unspecified osteoarthritis, unspecified site: Secondary | ICD-10-CM | POA: Insufficient documentation

## 2019-10-04 DIAGNOSIS — F419 Anxiety disorder, unspecified: Secondary | ICD-10-CM | POA: Insufficient documentation

## 2019-10-04 DIAGNOSIS — E785 Hyperlipidemia, unspecified: Secondary | ICD-10-CM | POA: Insufficient documentation

## 2019-10-04 DIAGNOSIS — E079 Disorder of thyroid, unspecified: Secondary | ICD-10-CM | POA: Insufficient documentation

## 2019-10-04 DIAGNOSIS — E039 Hypothyroidism, unspecified: Secondary | ICD-10-CM | POA: Insufficient documentation

## 2019-10-04 DIAGNOSIS — I639 Cerebral infarction, unspecified: Secondary | ICD-10-CM | POA: Insufficient documentation

## 2019-10-04 DIAGNOSIS — G43909 Migraine, unspecified, not intractable, without status migrainosus: Secondary | ICD-10-CM | POA: Insufficient documentation

## 2019-10-04 DIAGNOSIS — K5730 Diverticulosis of large intestine without perforation or abscess without bleeding: Secondary | ICD-10-CM | POA: Insufficient documentation

## 2019-10-07 ENCOUNTER — Encounter: Payer: Self-pay | Admitting: Cardiology

## 2019-10-07 NOTE — Progress Notes (Signed)
Cardiology Office Note:    Date:  10/08/2019   ID:  Brandy Flowers, DOB 05/19/1940, MRN [id number]  PCP:  Brandy Kingdom, MD  Cardiologist:  Brandy Herrlich, MD   Referring MD: Brandy Kingdom, MD  ASSESSMENT: CAD   1. Hyperlipidemia, unspecified hyperlipidemia type   2. Statin not tolerated   3. Essential hypertension   4. Renal artery stenosis (HCC)   5. Type 2 diabetes, controlled, with neuropathy (HCC)    PLAN:    In order of problems listed above:  1. She has severe dyslipidemia intolerant of statins and Zetia appropriate for well prescribed PCSK9 therapy and if necessary for approval refer to pharmacy clinic for lipid disorder. 2. Stable hypertension BP at target continue current treatment beta-blocker 3. Stable followed by vascular surgery 4. Stable managed with her PCP 5. Stable hypothyroidism continue her current supplement TSH normal 0.48  Next appointment   Medication Adjustments/Labs and Tests Ordered: Current medicines are reviewed at length with the patient today.  Concerns regarding medicines are outlined above.  Orders Placed This Encounter  Procedures  . AMB Referral to [facility]  . EKG 12-Lead   No orders of the defined types were placed in this encounter.    No chief complaint on file.   History of Present Illness:    Brandy Flowers is a 79 y.o. female with a history of hypertension renal artery stenosis type 2 diabetes hyperlipidemia and stroke November 2019 who is being seen today for the evaluation of CAD at the request of Brandy Kingdom, MD.  She was seen by vascular surgery 08/29/2019 I reviewed their note prior to the office she has mild bilateral renal artery stenosis.  Non-Invasive Vascular Imaging on 08/29/2019:   Renal artery duplex 08/29/2019: Right: RRV flow present. 1-59% stenosis of the right renal artery.  Left: LRV flow present. 1-59% stenosis of the left renal artery  She had a cardiac echo  performed 12/18/2017 showing normal left ventricular size and function ejection fraction 65 to 70%.  There was no significant valvular abnormality.  When seen for stroke 01/09/2018 she had no significant cerebrovascular stenotic lesion and was treated with aspirin and clopidogrel.  Laboratory studies 07/13/2019 cholesterol 226 triglycerides 133 LDL 137 HDL 66.  CMP was normal except for glucose 138 and there is a notation that she is intolerant of all statins and has tried multiple preparations low intermediate and high intensity.  She is here today to discuss alternatives for treating her lipid disorder high risk with stroke.  She is intolerant of all doses all statins including low intensity pravastatin simvastatin lovastatin a atorvastatin and rosuvastatin and also intolerant of Zetia with muscle pain and weakness.  She inquires about PCSK9 therapy assay its appropriate for and will initiate and if need be sent her to lipid clinic for approval.  She tells me that her primary care physician tried but was unable to get it performed.  She has no known history of heart disease no angina dyspnea palpitation or syncope.  Past Medical History:  Diagnosis Date  . Anxiety   . Arthritis   . COPD with exacerbation (HCC)   . Diabetes mellitus without complication (HCC)   . Diastolic dysfunction    Grade 1  . Hyperlipidemia   . Hypertension   . Hypothyroidism   . Migraines   . Renal artery stenosis (HCC)    Left 50%  . Sigmoid diverticulosis   . Sinus problem   . Splenic artery aneurysm (HCC)  46mm  . Stroke (HCC)   . Thyroid disease     Past Surgical History:  Procedure Laterality Date  . BREAST REDUCTION SURGERY  2007  . CATARACT EXTRACTION Bilateral   . CHOLECYSTECTOMY      Current Medications: Current Meds  Medication Sig  . ACCU-CHEK GUIDE test strip CHECK BLOOD SUGAR DAILY DX  E11.9 50  . ACETAMINOPHEN-BUTALBITAL 50-325 MG TABS Take 1 tablet by mouth 3 (three) times daily as  needed.  Marland Kitchen aspirin 325 MG tablet Take 1 tablet (325 mg total) by mouth daily. (Patient taking differently: Take 325 mg by mouth daily. Takes 2 daily)  . atenolol (TENORMIN) 50 MG tablet Take 50 mg by mouth 2 (two) times daily.  . diclofenac (FLECTOR) 1.3 % PTCH Place 1 patch onto the skin daily as needed.   . diclofenac Sodium (VOLTAREN) 1 % GEL Apply 2 g topically 4 (four) times daily.  Marland Kitchen gabapentin (NEURONTIN) 100 MG capsule Take 1 capsule (100 mg total) by mouth at bedtime.  . lidocaine (LIDODERM) 5 % APPLY 1 PATCH TO INTACT SKIN REMOVE AFTER 12 HOURS ONCE A DAY TO NECK AS NEEDED  . LORazepam (ATIVAN) 1 MG tablet Take 1 mg by mouth 3 (three) times daily as needed.  . meclizine (ANTIVERT) 25 MG tablet Take 25 mg by mouth 2 (two) times daily as needed for dizziness.  Marland Kitchen olmesartan (BENICAR) 40 MG tablet Take 40 mg by mouth daily.  Marland Kitchen SYNTHROID 50 MCG tablet Take 100 mcg by mouth daily before breakfast.      Allergies:   Amiloride, Blue dyes (parenteral), Inderal la [propranolol hcl], Sulfa antibiotics, Ace inhibitors, Norvasc [amlodipine], Calcium channel blockers, Catapres [clonidine hcl], Clonidine derivatives, Diovan [valsartan], Hydralazine, Metformin and related, Micardis [telmisartan], Minoxidil, Plavix [clopidogrel], Serotonin reuptake inhibitors (ssris), Topamax [topiramate], Tramadol, Tylenol with codeine #3 [acetaminophen-codeine], Zetia [ezetimibe], Amoxicillin, Chlorthalidone, Doxycycline, Fosamax [alendronate sodium], Lexapro [escitalopram], Spironolactone, and Statins   Social History   Socioeconomic History  . Marital status: Widowed    Spouse name: Not on file  . Number of children: Not on file  . Years of education: Not on file  . Highest education level: Not on file  Occupational History  . Not on file  Tobacco Use  . Smoking status: Never Smoker  . Smokeless tobacco: Never Used  Vaping Use  . Vaping Use: Never used  Substance and Sexual Activity  . Alcohol use: No  .  Drug use: No  . Sexual activity: Not Currently  Other Topics Concern  . Not on file  Social History Narrative  . Not on file   Social Determinants of Health   Financial Resource Strain:   . Difficulty of Paying Living Expenses: Not on file  Food Insecurity:   . Worried About Programme researcher, broadcasting/film/video in the Last Year: Not on file  . Ran Out of Food in the Last Year: Not on file  Transportation Needs:   . Lack of Transportation (Medical): Not on file  . Lack of Transportation (Non-Medical): Not on file  Physical Activity:   . Days of Exercise per Week: Not on file  . Minutes of Exercise per Session: Not on file  Stress:   . Feeling of Stress : Not on file  Social Connections:   . Frequency of Communication with Friends and Family: Not on file  . Frequency of Social Gatherings with Friends and Family: Not on file  . Attends Religious Services: Not on file  . Active Member of Clubs  or Organizations: Not on file  . Attends Banker Meetings: Not on file  . Marital Status: Not on file     Family History: The patient's family history includes Diabetes in her mother; Heart attack in her father; Heart disease in her brother, father, and mother; Hyperlipidemia in her mother; Hypertension in her brother, father, and mother; Stroke in her mother.  ROS:   ROS Please see the history of present illness.     All other systems reviewed and are negative.  EKGs/Labs/Other Studies Reviewed:    The following studies were reviewed today:   EKG:  EKG is  ordered today.  The ekg ordered today is personally reviewed and demonstrates QS V2 lead placement possible septal MI  Recent Labs: 12/26/2018: ALT 22; BUN 28; Creatinine, Ser 0.74; Hemoglobin 15.2; Platelets 256; Potassium 5.3; Sodium 139  Recent Lipid Panel    Component Value Date/Time   CHOL 205 (H) 12/18/2017 0353   TRIG 124 12/18/2017 0353   HDL 41 12/18/2017 0353   CHOLHDL 5.0 12/18/2017 0353   VLDL 25 12/18/2017 0353    LDLCALC 139 (H) 12/18/2017 0353    Physical Exam:    VS:  BP (!) 150/60   Pulse 63   Ht 5\' 1"  (1.549 m)   Wt 110 lb (49.9 kg)   SpO2 97%   BMI 20.78 kg/m     Wt Readings from Last 3 Encounters:  10/08/19 110 lb (49.9 kg)  08/22/19 111 lb (50.3 kg)  08/29/19 108 lb 8 oz (49.2 kg)     GEN: Quite thin well nourished, well developed in no acute distress HEENT: Normal NECK: No JVD; No carotid bruits LYMPHATICS: No lymphadenopathy CARDIAC: RRR, no murmurs, rubs, gallops RESPIRATORY:  Clear to auscultation without rales, wheezing or rhonchi  ABDOMEN: Soft, non-tender, non-distended MUSCULOSKELETAL:  No edema; No deformity  SKIN: Warm and dry NEUROLOGIC:  Alert and oriented x 3 PSYCHIATRIC:  Normal affect     Signed, 08/31/19, MD  10/08/2019 11:08 AM    [facility]

## 2019-10-08 ENCOUNTER — Telehealth: Payer: Self-pay | Admitting: Cardiology

## 2019-10-08 ENCOUNTER — Other Ambulatory Visit: Payer: Self-pay

## 2019-10-08 ENCOUNTER — Ambulatory Visit (INDEPENDENT_AMBULATORY_CARE_PROVIDER_SITE_OTHER): Payer: Medicare Other | Admitting: Cardiology

## 2019-10-08 ENCOUNTER — Encounter: Payer: Self-pay | Admitting: Cardiology

## 2019-10-08 VITALS — BP 150/60 | HR 63 | Ht 61.0 in | Wt 110.0 lb

## 2019-10-08 DIAGNOSIS — I10 Essential (primary) hypertension: Secondary | ICD-10-CM

## 2019-10-08 DIAGNOSIS — Z789 Other specified health status: Secondary | ICD-10-CM

## 2019-10-08 DIAGNOSIS — I701 Atherosclerosis of renal artery: Secondary | ICD-10-CM

## 2019-10-08 DIAGNOSIS — E1140 Type 2 diabetes mellitus with diabetic neuropathy, unspecified: Secondary | ICD-10-CM | POA: Diagnosis not present

## 2019-10-08 DIAGNOSIS — E785 Hyperlipidemia, unspecified: Secondary | ICD-10-CM

## 2019-10-08 HISTORY — DX: Other specified health status: Z78.9

## 2019-10-08 NOTE — Patient Instructions (Signed)
Medication Instructions:  Your physician recommends that you continue on your current medications as directed. Please refer to the Current Medication list given to you today.  *If you need a refill on your cardiac medications before your next appointment, please call your pharmacy*   Lab Work: Your physician recommends that you return for lab work in: Within one month of starting Repatha If you have labs (blood work) drawn today and your tests are completely normal, you will receive your results only by: Marland Kitchen MyChart Message (if you have MyChart) OR . A paper copy in the mail If you have any lab test that is abnormal or we need to change your treatment, we will call you to review the results.   Testing/Procedures: None   Follow-Up: At [facility], you and your health needs are our priority.  As part of our continuing mission to provide you with exceptional heart care, we have created designated Provider Care Teams.  These Care Teams include your primary Cardiologist (physician) and Advanced Practice Providers (APPs -  Physician Assistants and Nurse Practitioners) who all work together to provide you with the care you need, when you need it.  We recommend signing up for the patient portal called "MyChart".  Sign up information is provided on this After Visit Summary.  MyChart is used to connect with patients for Virtual Visits (Telemedicine).  Patients are able to view lab/test results, encounter notes, upcoming appointments, etc.  Non-urgent messages can be sent to your provider as well.   To learn more about what you can do with MyChart, go to [email or web address].    Your next appointment:   3 month(s)  The format for your next appointment:   In Person  Provider:   Norman Herrlich, MD   Other Instructions We have put in a referral for you to speak with our advanced lipid clinic. They will help you to get started on Repatha.

## 2019-10-08 NOTE — Telephone Encounter (Signed)
Patient calling to schedule PharmD appt - she wants to do virtual, is this possible? Please advise.

## 2019-10-08 NOTE — Telephone Encounter (Signed)
Spoke with pt, scheduled virtual lipid appt for tomorrow. She is familiar with PCSK9i so education can be provided on the phone.

## 2019-10-09 ENCOUNTER — Telehealth (INDEPENDENT_AMBULATORY_CARE_PROVIDER_SITE_OTHER): Payer: Medicare Other | Admitting: Pharmacist

## 2019-10-09 DIAGNOSIS — T466X5A Adverse effect of antihyperlipidemic and antiarteriosclerotic drugs, initial encounter: Secondary | ICD-10-CM

## 2019-10-09 DIAGNOSIS — E785 Hyperlipidemia, unspecified: Secondary | ICD-10-CM

## 2019-10-09 DIAGNOSIS — G720 Drug-induced myopathy: Secondary | ICD-10-CM

## 2019-10-09 HISTORY — DX: Drug-induced myopathy: G72.0

## 2019-10-09 HISTORY — DX: Drug-induced myopathy: T46.6X5A

## 2019-10-09 NOTE — Progress Notes (Signed)
Patient ID: Brandy Flowers                 DOB: 07-08-1940                    MRN: [id number]     HPI: Brandy Flowers is a 79 y.o. female patient referred to lipid clinic by Dr Dulce Sellar. PMH is significant for HTN, HLD, DM2, stroke in November 2019, and 1-59% left and right renal artery stenosis. Pt has a history of statin intolerance and was referred to lipid clinic to discuss PCSK9i therapy.  Visit conducted today via telehealth per pt request. She reports experiencing muscle pain and weakness in her legs on statins as well as GI upset with a few. She has tried 5 statins over the years, most recently rosuvastatin 10mg  3 days a week. She experienced similar side effects on Zetia 10mg  daily. She does still have some residual leg pain from her stroke, she takes gabapentin for this. Her PCP recently tried to start her on Repatha but prior was denied since she had not taken a high intensity statin for long enough. Prior statin doses are unknown as they were not prescribed by anyone in our health system. There is no clinical indication to rechallenge with this since pt is intolerant to low dose statin a few days a week.  Current Medications: none Intolerances: pravastatin, simvastatin, lovastatin, atorvastatin, rosuvastatin 10mg  3 days a week, Zetia - muscle pain and weakness, GI upset Risk Factors: stroke, DM, HTN, age, family history LDL goal: 70mg /dL  Diet: Doesn't eat eggs or red meat. Likes chicken and . Occasionally eats a TV dinner. Cereal for breakfast, sandwich for lunch.  Exercise:  Uses bike pedals to exercise her legs.  Family History: Diabetes in her mother; Heart attack in her father; Heart disease in her brother, father, and mother; Hyperlipidemia in her mother; Hypertension in her brother, father, and mother; Stroke in her mother.  Social History: Denies tobacco, alcohol, and illicit drug use.  Labs: 07/13/19: TC 226, TG 133, VLDL 23, HDL 66. Calculated LDL of since not  reported  Past Medical History:  Diagnosis Date  . Anxiety   . Arthritis   . COPD with exacerbation (HCC)   . Diabetes mellitus without complication (HCC)   . Diastolic dysfunction    Grade 1  . Hyperlipidemia   . Hypertension   . Hypothyroidism   . Migraines   . Renal artery stenosis (HCC)    Left 50%  . Sigmoid diverticulosis   . Sinus problem   . Splenic artery aneurysm (HCC)    49mm  . Stroke (HCC)   . Thyroid disease     Current Outpatient Medications on File Prior to Visit  Medication Sig Dispense Refill  . ACCU-CHEK GUIDE test strip CHECK BLOOD SUGAR DAILY DX  E11.9 50    . ACETAMINOPHEN-BUTALBITAL 50-325 MG TABS Take 1 tablet by mouth 3 (three) times daily as needed.    07/15/19 aspirin 325 MG tablet Take 1 tablet (325 mg total) by mouth daily. (Patient taking differently: Take 325 mg by mouth daily. Takes 2 daily)    . atenolol (TENORMIN) 50 MG tablet Take 50 mg by mouth 2 (two) times daily.    . diclofenac (FLECTOR) 1.3 % PTCH Place 1 patch onto the skin daily as needed.     . diclofenac Sodium (VOLTAREN) 1 % GEL Apply 2 g topically 4 (four) times daily.    782 gabapentin (NEURONTIN) 100  MG capsule Take 1 capsule (100 mg total) by mouth at bedtime. 3 capsule 0  . lidocaine (LIDODERM) 5 % APPLY 1 PATCH TO INTACT SKIN REMOVE AFTER 12 HOURS ONCE A DAY TO NECK AS NEEDED    . LORazepam (ATIVAN) 1 MG tablet Take 1 mg by mouth 3 (three) times daily as needed.    . meclizine (ANTIVERT) 25 MG tablet Take 25 mg by mouth 2 (two) times daily as needed for dizziness.    Marland Kitchen olmesartan (BENICAR) 40 MG tablet Take 40 mg by mouth daily.    Marland Kitchen SYNTHROID 50 MCG tablet Take 100 mcg by mouth daily before breakfast.      No current facility-administered medications on file prior to visit.    Allergies  Allergen Reactions  . Amiloride Shortness Of Breath  . Blue Dyes (Parenteral) Shortness Of Breath, Rash and Other (See Comments)    Joint pain  . Inderal La [Propranolol Hcl] Shortness Of Breath   . Sulfa Antibiotics Anaphylaxis  . Ace Inhibitors Nausea And Vomiting  . Norvasc [Amlodipine] Nausea And Vomiting  . Calcium Channel Blockers Diarrhea  . Catapres [Clonidine Hcl]     Decreased mental clarity   . Clonidine Derivatives Other (See Comments)    Headache, flu like symptoms  . Diovan [Valsartan] Cough  . Hydralazine Other (See Comments)    Headache  . Metformin And Related Diarrhea  . Micardis [Telmisartan] Other (See Comments)    Hallucinations, increase in BP  . Minoxidil Other (See Comments)    Fast heart rate, chest pain  . Plavix [Clopidogrel]     Increased LFT's   . Serotonin Reuptake Inhibitors (Ssris) Other (See Comments)    Headache  . Topamax [Topiramate] Other (See Comments)    Confusion  . Tramadol     Nausea as a dog per pt  . Tylenol With Codeine #3 [Acetaminophen-Codeine] Other (See Comments)    Lightheaded  . Zetia [Ezetimibe]     Liver  Function increase labs  . Amoxicillin Diarrhea  . Chlorthalidone     Dizziness.   . Doxycycline Diarrhea  . Fosamax [Alendronate Sodium]     Pt's teeth fell out while taking.   . Lexapro [Escitalopram]     Brain Fog   . Spironolactone Palpitations  . Statins     Upset stomach     Assessment/Plan:  1. Hyperlipidemia - LDL is 133 above goal < 70 due to history of ASCVD. Pt is intolerant to 5 statins including rosuvastatin 10mg  3 days per week as well as Zetia. Discussed PCSK9i therapy including benefit, injection technique, and side effects. Submitted prior authorization for Repatha and will reach out to pt once approved. She will qualify for copay card but will need to activate this herself.  Gail Creekmore E. Vernette Moise, PharmD, BCACP, CPP [facility] [address], [place], [place] Phone: [phone]; Fax: [phone] 10/09/2019 9:45 AM

## 2019-10-23 ENCOUNTER — Telehealth: Payer: Self-pay

## 2019-10-23 MED ORDER — REPATHA SURECLICK 140 MG/ML ~~LOC~~ SOAJ: 140.0000 mg | SUBCUTANEOUS | 11 refills | Status: DC

## 2019-10-23 NOTE — Telephone Encounter (Signed)
lmomed the pt to start repatha and to call us back if its unaffordable

## 2019-10-24 NOTE — Telephone Encounter (Signed)
Pt returned a call to [place] and I explained the repatha rx sent and how to apply for copay card and to call us if she has issues

## 2019-11-16 ENCOUNTER — Encounter: Payer: Self-pay | Admitting: Sports Medicine

## 2019-11-16 ENCOUNTER — Other Ambulatory Visit: Payer: Self-pay

## 2019-11-16 ENCOUNTER — Ambulatory Visit (INDEPENDENT_AMBULATORY_CARE_PROVIDER_SITE_OTHER): Payer: Medicare Other | Admitting: Sports Medicine

## 2019-11-16 DIAGNOSIS — Z8673 Personal history of transient ischemic attack (TIA), and cerebral infarction without residual deficits: Secondary | ICD-10-CM

## 2019-11-16 DIAGNOSIS — Z7901 Long term (current) use of anticoagulants: Secondary | ICD-10-CM | POA: Diagnosis not present

## 2019-11-16 DIAGNOSIS — B351 Tinea unguium: Secondary | ICD-10-CM | POA: Diagnosis not present

## 2019-11-16 DIAGNOSIS — E1140 Type 2 diabetes mellitus with diabetic neuropathy, unspecified: Secondary | ICD-10-CM

## 2019-11-16 DIAGNOSIS — M79676 Pain in unspecified toe(s): Secondary | ICD-10-CM

## 2019-11-16 NOTE — Progress Notes (Signed)
Subjective: Brandy Flowers is a 79 y.o. female patient with history of diabetes, not on any medications, who presents to office today complaining of long, painful nails while ambulating in shoes; unable to trim. Patient states that the glucose reading this morning was 140. No other pedal complaints.   Patient Active Problem List   Diagnosis Date Noted  . 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 Statin not tolerated 10/08/2019  . Thyroid disease   . Stroke (HCC)   . Sinus problem   . Sigmoid diverticulosis   . Migraines   . Hypothyroidism   . Hyperlipidemia   . Diabetes mellitus without complication (HCC)   . Arthritis   . Anxiety   . Renal artery stenosis (HCC)   . Hyponatremia   . Labile blood pressure   . 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 Oropharyngeal dysphagia   . Diastolic dysfunction   . Dysphagia, post-stroke   . Hypokalemia   . Stroke (cerebrum) (HCC) 12/17/2017  . Anxiety state 12/17/2017  . 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 Left renal artery stenosis (HCC) 12/05/2014  . 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 Hypertension 09/20/2013  . Hypothyroid 09/20/2013   Current Outpatient Medications on File Prior to Visit  Medication Sig Dispense Refill  . ACCU-CHEK GUIDE test strip CHECK BLOOD SUGAR DAILY DX  E11.9 50    . ACETAMINOPHEN-BUTALBITAL 50-325 MG TABS Take 1 tablet by mouth 3 (three) times daily as needed.    Marland Kitchen aspirin 325 MG tablet Take 1 tablet (325 mg total) by mouth daily. (Patient taking differently: Take 325 mg by mouth daily. Takes 2 daily)    . atenolol (TENORMIN) 50 MG tablet Take 50 mg by mouth 2 (two) times daily.    . diclofenac (FLECTOR) 1.3 % PTCH Place 1 patch onto the skin daily as needed.     . diclofenac Sodium (VOLTAREN) 1 % GEL Apply 2 g topically 4 (four) times daily.    . Evolocumab (REPATHA SURECLICK) 140 MG/ML SOAJ Inject 140 mg into the skin every 14 (fourteen) days. 2 mL 11  . gabapentin  (NEURONTIN) 100 MG capsule Take 1 capsule (100 mg total) by mouth at bedtime. 3 capsule 0  . lidocaine (LIDODERM) 5 % APPLY 1 PATCH TO INTACT SKIN REMOVE AFTER 12 HOURS ONCE A DAY TO NECK AS NEEDED    . LORazepam (ATIVAN) 1 MG tablet Take 1 mg by mouth 3 (three) times daily as needed.    . meclizine (ANTIVERT) 25 MG tablet Take 25 mg by mouth 2 (two) times daily as needed for dizziness.    Marland Kitchen olmesartan (BENICAR) 40 MG tablet Take 40 mg by mouth daily.    Marland Kitchen SYNTHROID 50 MCG tablet Take 100 mcg by mouth daily before breakfast.      No current facility-administered medications on file prior to visit.   Allergies  Allergen Reactions  . Amiloride Shortness Of Breath  . Blue Dyes (Parenteral) Shortness Of Breath, Rash and Other (See Comments)    Joint pain  . Inderal La [Propranolol Hcl] Shortness Of Breath  . Sulfa Antibiotics Anaphylaxis  . Ace Inhibitors Nausea And Vomiting  . Norvasc [Amlodipine] Nausea And Vomiting  . Calcium Channel Blockers Diarrhea  . Catapres [Clonidine Hcl]     Decreased mental clarity   . Clonidine Derivatives Other (See Comments)    Headache, flu like symptoms  . Diovan [Valsartan] Cough  . Hydralazine Other (See Comments)    Headache  .  Metformin And Related Diarrhea  . Micardis [Telmisartan] Other (See Comments)    Hallucinations, increase in BP  . Minoxidil Other (See Comments)    Fast heart rate, chest pain  . Plavix [Clopidogrel]     Increased LFT's   . Serotonin Reuptake Inhibitors (Ssris) Other (See Comments)    Headache  . Topamax [Topiramate] Other (See Comments)    Confusion  . Tramadol     Nausea as a dog per pt  . Tylenol With Codeine #3 [Acetaminophen-Codeine] Other (See Comments)    Lightheaded  . Zetia [Ezetimibe]     Liver  Function increase labs  . Amoxicillin Diarrhea  . Chlorthalidone     Dizziness.   . Doxycycline Diarrhea  . Fosamax [Alendronate Sodium]     Pt's teeth fell out while taking.   . Lexapro [Escitalopram]      Brain Fog   . Spironolactone Palpitations  . Statins     Upset stomach     No results found for this or any previous visit (from the past 2160 hour(s)).  Objective: General: Patient is awake, alert and in no acute distress.  Using rolling walker.  Integument: Skin is warm, dry and supple bilateral. Nails are tender, long, thickened and dystrophic with subungual debris, consistent with onychomycosis, 1-5 bilateral. No signs of infection. No open lesions or preulcerative lesions present bilateral. Small pinch callus at distal tuft of left 4th toe with no signs of infection.  Remaining integument unremarkable.  Vasculature:  Dorsalis Pedis pulse 1/4 bilateral. Posterior Tibial pulse  1/4 bilateral. Capillary fill time <3 sec 1-5 bilateral. Positive hair growth to the level of the digits.Temperature gradient within normal limits. No varicosities present bilateral. No edema present bilateral.   Neurology: The patient has intact sensation measured with a 5.07/10g Semmes Weinstein Monofilament at all pedal sites bilateral . Vibratory sensation diminished bilateral with tuning fork. No Babinski sign present bilateral.   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  Strength acceptable for patient status.  No tenderness with calf compression bilateral.  Assessment and Plan: Problem List Items Addressed This Visit      Endocrine   Type 2 diabetes, controlled, with neuropathy (HCC)    Other Visit Diagnoses    Pain due to onychomycosis of toenail    -  Primary   Anticoagulant long-term use       History of stroke         -Examined patient. -Discussed and educated patient on diabetic foot care, especially with regards to the vascular, neurological and musculoskeletal systems.  -Mechanically debrided all nails 1-5 bilateral using sterile nail nipper and filed with dremel without incident   -Smoothed callus using bur at tip of left 4th toe without incident   -Patient to return in 2.5 to 3 months for at risk foot care -Patient advised to call the office if any problems or questions arise in the meantime.  Asencion Islam, DPM

## 2019-12-01 ENCOUNTER — Ambulatory Visit: Payer: Medicare Other | Attending: Internal Medicine

## 2019-12-01 DIAGNOSIS — Z23 Encounter for immunization: Secondary | ICD-10-CM

## 2019-12-01 NOTE — Progress Notes (Signed)
   Covid-19 Vaccination Clinic  Name:  Brandy Flowers    MRN: [id number] DOB: 05-Jun-1940  12/01/2019  [name] was observed post Covid-19 immunization for 15 minutes without incident. She was provided with Vaccine Information Sheet and instruction to access the V-Safe system.   [name] was instructed to call 911 with any severe reactions post vaccine: Marland Kitchen Difficulty breathing  . Swelling of face and throat  . A fast heartbeat  . A bad rash all over body  . Dizziness and weakness

## 2019-12-11 ENCOUNTER — Telehealth: Payer: Self-pay | Admitting: Cardiology

## 2019-12-11 NOTE — Telephone Encounter (Signed)
She can hold the medication until she sees Dr. Dulce Sellar on 11/18.

## 2019-12-11 NOTE — Telephone Encounter (Signed)
Pt c/o medication issue:  1. Name of Medication: Evolocumab (REPATHA SURECLICK) 140 MG/ML SOAJ  2. How are you currently taking this medication (dosage and times per day)? 1 injection every 14 days  3. Are you having a reaction (difficulty breathing--STAT)? no  4. What is your medication issue? Patient states she is having bad headaches while on the medication. She states this is the worst headaches she's ever had. She also states her head is foggy and she has pain in her left arm and right leg. She states it is an itchy pain in her arm and when she uses a pain patch it goes away.

## 2019-12-12 NOTE — Telephone Encounter (Signed)
Left message on patients voicemail to please return our call.   

## 2019-12-12 NOTE — Telephone Encounter (Signed)
Spoke to the patient just now and let her know Dr. Mallory Shirk recommendation. She verbalizes understanding and thanks me for the call back.   Encouraged patient to call back with any questions or concerns.

## 2019-12-12 NOTE — Telephone Encounter (Signed)
Patient is returning call.  °

## 2019-12-31 DIAGNOSIS — J441 Chronic obstructive pulmonary disease with (acute) exacerbation: Secondary | ICD-10-CM | POA: Insufficient documentation

## 2020-01-02 NOTE — Progress Notes (Signed)
Cardiology Office Note:    Date:  01/03/2020   ID:  Brandy Flowers, DOB [birth year], MRN [id number]  PCP:  Philemon Kingdom, MD  Cardiologist:  Norman Herrlich, MD    Referring MD: Philemon Kingdom, MD    ASSESSMENT:    1. Pure hypercholesterolemia   2. Essential hypertension   3. Type 2 diabetes, controlled, with neuropathy (HCC)    PLAN:    In order of problems listed above:  1. She will resume her Repatha follow-up with her primary care physician for refills and labs 2. BP at target continue current treatment including beta-blocker and MR a along with Benicar 3. Stable managed by her PCP   Next appointment: As needed   Medication Adjustments/Labs and Tests Ordered: Current medicines are reviewed at length with the patient today.  Concerns regarding medicines are outlined above.  No orders of the defined types were placed in this encounter.  No orders of the defined types were placed in this encounter.   Chief Complaint  Patient presents with  . Follow-up  . Hypertension  . Hyperlipidemia    History of Present Illness:   - Brandy Flowers is a 79 y.o. female with a hx of hypertension with renal artery stenosis type 2 diabetes with neuropathy and severe dyslipidemia intolerant of statins and Zetia last seen 10/08/2019 and referred to lipid clinic for PCSK9 inhibitor therapy.. Compliance with diet, lifestyle and medications: Yes  She was having headache she did receive Covid vaccine Repatha worsened headache contacted lipid clinic and was told to stop Repatha till seen by me. We had a nice opportunity along with her daughter to discuss her symptoms mechanism Repatha and although I do believe she is having severe headache I do not think it had anything to do with her PCSK9 inhibitor she agrees with me she is thrilled by the response she had to Repatha reports a LDL of 76 on the drug and will resume. She not having muscle pain or weakness I will leave further follow-up  in my office as needed. Past Medical History:  Diagnosis Date  . Anxiety   . Arthritis   . COPD with exacerbation (HCC)   . Diabetes mellitus without complication (HCC)   . Diastolic dysfunction    Grade 1  . Hyperlipidemia   . Hypertension   . Hypothyroidism   . Migraines   . Renal artery stenosis (HCC)    Left 50%  . Sigmoid diverticulosis   . Sinus problem   . Splenic artery aneurysm (HCC)    21mm  . Stroke (HCC)   . Thyroid disease     Past Surgical History:  Procedure Laterality Date  . BREAST REDUCTION SURGERY  2007  . CATARACT EXTRACTION Bilateral   . CHOLECYSTECTOMY      Current Medications: Current Meds  Medication Sig  . ACCU-CHEK GUIDE test strip CHECK BLOOD SUGAR DAILY DX  E11.9 50  . ACETAMINOPHEN-BUTALBITAL 50-325 MG TABS Take 1 tablet by mouth 3 (three) times daily as needed.  Marland Kitchen aspirin 325 MG tablet Take 1 tablet (325 mg total) by mouth daily. (Patient taking differently: Take 325 mg by mouth daily. Takes 2 daily PRN)  . atenolol (TENORMIN) 50 MG tablet Take 50 mg by mouth 2 (two) times daily.  . diclofenac (FLECTOR) 1.3 % PTCH Place 1 patch onto the skin daily as needed.   . diclofenac Sodium (VOLTAREN) 1 % GEL Apply 2 g topically 4 (four) times daily.  Marland Kitchen eplerenone (INSPRA) 50 MG tablet Take  50 mg by mouth daily.  Marland Kitchen gabapentin (NEURONTIN) 100 MG capsule Take 1 capsule (100 mg total) by mouth at bedtime. (Patient taking differently: Take 100 mg by mouth in the morning. )  . gabapentin (NEURONTIN) 300 MG capsule Take 300 mg by mouth at bedtime.  Marland Kitchen ketoconazole (NIZORAL) 2 % shampoo Apply topically daily.  Marland Kitchen LORazepam (ATIVAN) 1 MG tablet Take 1 mg by mouth 3 (three) times daily as needed.  . meclizine (ANTIVERT) 25 MG tablet Take 25 mg by mouth 2 (two) times daily as needed for dizziness.  Marland Kitchen olmesartan (BENICAR) 40 MG tablet Take 40 mg by mouth daily.  Marland Kitchen SYNTHROID 50 MCG tablet Take 100 mcg by mouth daily before breakfast.      Allergies:   Amiloride,  Blue dyes (parenteral), Inderal la [propranolol hcl], Sulfa antibiotics, Ace inhibitors, Norvasc [amlodipine], Calcium channel blockers, Catapres [clonidine hcl], Clonidine derivatives, Diovan [valsartan], Hydralazine, Metformin and related, Micardis [telmisartan], Minoxidil, Plavix [clopidogrel], Repatha [evolocumab], Serotonin reuptake inhibitors (ssris), Topamax [topiramate], Tramadol, Tylenol with codeine #3 [acetaminophen-codeine], Zetia [ezetimibe], Amoxicillin, Chlorthalidone, Doxycycline, Fosamax [alendronate sodium], Lexapro [escitalopram], Spironolactone, and Statins   Social History   Socioeconomic History  . Marital status: Widowed    Spouse name: Not on file  . Number of children: Not on file  . Years of education: Not on file  . Highest education level: Not on file  Occupational History  . Not on file  Tobacco Use  . Smoking status: Never Smoker  . Smokeless tobacco: Never Used  Vaping Use  . Vaping Use: Never used  Substance and Sexual Activity  . Alcohol use: No  . Drug use: No  . Sexual activity: Not Currently  Other Topics Concern  . Not on file  Social History Narrative  . Not on file   Social Determinants of Health   Financial Resource Strain:   . Difficulty of Paying Living Expenses: Not on file  Food Insecurity:   . Worried About Programme researcher, broadcasting/film/video in the Last Year: Not on file  . Ran Out of Food in the Last Year: Not on file  Transportation Needs:   . Lack of Transportation (Medical): Not on file  . Lack of Transportation (Non-Medical): Not on file  Physical Activity:   . Days of Exercise per Week: Not on file  . Minutes of Exercise per Session: Not on file  Stress:   . Feeling of Stress : Not on file  Social Connections:   . Frequency of Communication with Friends and Family: Not on file  . Frequency of Social Gatherings with Friends and Family: Not on file  . Attends Religious Services: Not on file  . Active Member of Clubs or Organizations: Not  on file  . Attends Banker Meetings: Not on file  . Marital Status: Not on file     Family History: The patient's family history includes Diabetes in her mother; Heart attack in her father; Heart disease in her brother, father, and mother; Hyperlipidemia in her mother; Hypertension in her brother, father, and mother; Stroke in her mother. ROS:   Please see the history of present illness.    All other systems reviewed and are negative.  EKGs/Labs/Other Studies Reviewed:    The following studies were reviewed today:  CT of head [facility] 2019-12-24 no acute findings mild age-related volume loss there is atherosclerotic calcification of the major vessels of the base of the brain  Recent Labs: No results found for requested labs within  last 8760 hours.  Recent Lipid Panel    Component Value Date/Time   CHOL 205 (H) 12/18/2017 0353   TRIG 124 12/18/2017 0353   HDL 41 12/18/2017 0353   CHOLHDL 5.0 12/18/2017 0353   VLDL 25 12/18/2017 0353   LDLCALC 139 (H) 12/18/2017 0353    Physical Exam:    VS:  BP 121/66   Pulse 64   Ht 5\' 1"  (1.549 m)   Wt 113 lb 12.8 oz (51.6 kg)   SpO2 97%   BMI 21.50 kg/m     Wt Readings from Last 3 Encounters:  01/03/20 113 lb 12.8 oz (51.6 kg)  10/08/19 110 lb (49.9 kg)  08/22/19 111 lb (50.3 kg)     GEN:  Well nourished, well developed in no acute distress HEENT: Normal NECK: No JVD; No carotid bruits LYMPHATICS: No lymphadenopathy CARDIAC: RRR, no murmurs, rubs, gallops RESPIRATORY:  Clear to auscultation without rales, wheezing or rhonchi  ABDOMEN: Soft, non-tender, non-distended MUSCULOSKELETAL:  No edema; No deformity  SKIN: Warm and dry NEUROLOGIC:  Alert and oriented x 3 PSYCHIATRIC:  Normal affect    Signed, 10/23/19, MD  01/03/2020 10:46 AM    [facility]

## 2020-01-03 ENCOUNTER — Other Ambulatory Visit: Payer: Self-pay

## 2020-01-03 ENCOUNTER — Ambulatory Visit (INDEPENDENT_AMBULATORY_CARE_PROVIDER_SITE_OTHER): Payer: Medicare Other | Admitting: Cardiology

## 2020-01-03 ENCOUNTER — Encounter: Payer: Self-pay | Admitting: Cardiology

## 2020-01-03 VITALS — BP 121/66 | HR 64 | Ht 61.0 in | Wt 113.8 lb

## 2020-01-03 DIAGNOSIS — E7800 Pure hypercholesterolemia, unspecified: Secondary | ICD-10-CM

## 2020-01-03 DIAGNOSIS — E1140 Type 2 diabetes mellitus with diabetic neuropathy, unspecified: Secondary | ICD-10-CM | POA: Diagnosis not present

## 2020-01-03 DIAGNOSIS — I10 Essential (primary) hypertension: Secondary | ICD-10-CM

## 2020-01-03 NOTE — Patient Instructions (Signed)

## 2020-01-25 ENCOUNTER — Ambulatory Visit: Payer: Medicare Other | Admitting: Sports Medicine

## 2020-02-06 ENCOUNTER — Other Ambulatory Visit: Payer: Self-pay

## 2020-02-06 ENCOUNTER — Ambulatory Visit (INDEPENDENT_AMBULATORY_CARE_PROVIDER_SITE_OTHER): Payer: Medicare Other | Admitting: Sports Medicine

## 2020-02-06 ENCOUNTER — Encounter: Payer: Self-pay | Admitting: Sports Medicine

## 2020-02-06 DIAGNOSIS — M79676 Pain in unspecified toe(s): Secondary | ICD-10-CM | POA: Diagnosis not present

## 2020-02-06 DIAGNOSIS — B351 Tinea unguium: Secondary | ICD-10-CM | POA: Diagnosis not present

## 2020-02-06 DIAGNOSIS — Z7901 Long term (current) use of anticoagulants: Secondary | ICD-10-CM | POA: Diagnosis not present

## 2020-02-06 DIAGNOSIS — E1140 Type 2 diabetes mellitus with diabetic neuropathy, unspecified: Secondary | ICD-10-CM

## 2020-02-06 DIAGNOSIS — Z8673 Personal history of transient ischemic attack (TIA), and cerebral infarction without residual deficits: Secondary | ICD-10-CM

## 2020-02-06 NOTE — Progress Notes (Signed)
Subjective: Brandy Flowers is a 79 y.o. female patient with history of diabetes, not on any medications, who presents to office today complaining of long, painful nails and callus while ambulating in shoes; unable to trim. Patient states that the glucose reading this morning was 135.  No other pedal complaints.   Last visit to PCP few months ago.  Patient Active Problem List   Diagnosis Date Noted  . COPD with exacerbation (HCC)   . 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 Statin not tolerated 10/08/2019  . Thyroid disease   . Stroke (HCC)   . Sinus problem   . Sigmoid diverticulosis   . Migraines   . Hypothyroidism   . Hyperlipidemia   . Diabetes mellitus without complication (HCC)   . Arthritis   . Anxiety   . Renal artery stenosis (HCC)   . Hyponatremia   . Labile blood pressure   . 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 Oropharyngeal dysphagia   . Diastolic dysfunction   . Dysphagia, post-stroke   . Hypokalemia   . Stroke (cerebrum) (HCC) 12/17/2017  . Anxiety state 12/17/2017  . 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 Left renal artery stenosis (HCC) 12/05/2014  . 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 Hypertension 09/20/2013  . Hypothyroid 09/20/2013   Current Outpatient Medications on File Prior to Visit  Medication Sig Dispense Refill  . ACCU-CHEK GUIDE test strip CHECK BLOOD SUGAR DAILY DX  E11.9 50    . ACETAMINOPHEN-BUTALBITAL 50-325 MG TABS Take 1 tablet by mouth 3 (three) times daily as needed.    Marland Kitchen aspirin 325 MG tablet Take 1 tablet (325 mg total) by mouth daily. (Patient taking differently: Take 325 mg by mouth daily. Takes 2 daily PRN)    . atenolol (TENORMIN) 50 MG tablet Take 50 mg by mouth 2 (two) times daily.    . diclofenac (FLECTOR) 1.3 % PTCH Place 1 patch onto the skin daily as needed.     . diclofenac Sodium (VOLTAREN) 1 % GEL Apply 2 g topically 4 (four) times daily.    Marland Kitchen eplerenone (INSPRA) 50 MG tablet  Take 50 mg by mouth daily.    . Evolocumab (REPATHA SURECLICK) 140 MG/ML SOAJ Inject 140 mg into the skin every 14 (fourteen) days. (Patient not taking: Reported on 01/03/2020) 2 mL 11  . gabapentin (NEURONTIN) 100 MG capsule Take 1 capsule (100 mg total) by mouth at bedtime. (Patient taking differently: Take 100 mg by mouth in the morning. ) 3 capsule 0  . gabapentin (NEURONTIN) 300 MG capsule Take 300 mg by mouth at bedtime.    Marland Kitchen ketoconazole (NIZORAL) 2 % shampoo Apply topically daily.    Marland Kitchen lidocaine (LIDODERM) 5 % APPLY 1 PATCH TO INTACT SKIN REMOVE AFTER 12 HOURS ONCE A DAY TO NECK AS NEEDED (Patient not taking: Reported on 01/03/2020)    . LORazepam (ATIVAN) 1 MG tablet Take 1 mg by mouth 3 (three) times daily as needed.    . meclizine (ANTIVERT) 25 MG tablet Take 25 mg by mouth 2 (two) times daily as needed for dizziness.    Marland Kitchen olmesartan (BENICAR) 40 MG tablet Take 40 mg by mouth daily.    Marland Kitchen SYNTHROID 50 MCG tablet Take 100 mcg by mouth daily before breakfast.      No current facility-administered medications on file prior to visit.   Allergies  Allergen Reactions  . Amiloride Shortness Of Breath  . Blue Dyes (Parenteral) Shortness Of Breath, Rash and  Other (See Comments)    Joint pain  . Inderal La [Propranolol Hcl] Shortness Of Breath  . Sulfa Antibiotics Anaphylaxis  . Ace Inhibitors Nausea And Vomiting  . Norvasc [Amlodipine] Nausea And Vomiting  . Calcium Channel Blockers Diarrhea  . Catapres [Clonidine Hcl]     Decreased mental clarity   . Clonidine Derivatives Other (See Comments)    Headache, flu like symptoms  . Diovan [Valsartan] Cough  . Hydralazine Other (See Comments)    Headache  . Metformin And Related Diarrhea  . Micardis [Telmisartan] Other (See Comments)    Hallucinations, increase in BP  . Minoxidil Other (See Comments)    Fast heart rate, chest pain  . Plavix [Clopidogrel]     Increased LFT's   . Repatha [Evolocumab]   . Serotonin Reuptake Inhibitors  (Ssris) Other (See Comments)    Headache  . Topamax [Topiramate] Other (See Comments)    Confusion  . Tramadol     Nausea as a dog per pt  . Tylenol With Codeine #3 [Acetaminophen-Codeine] Other (See Comments)    Lightheaded  . Zetia [Ezetimibe]     Liver  Function increase labs  . Amoxicillin Diarrhea  . Chlorthalidone     Dizziness.   . Doxycycline Diarrhea  . Fosamax [Alendronate Sodium]     Pt's teeth fell out while taking.   . Lexapro [Escitalopram]     Brain Fog   . Spironolactone Palpitations  . Statins     Upset stomach     No results found for this or any previous visit (from the past 2160 hour(s)).  Objective: General: Patient is awake, alert and in no acute distress.  Using rolling walker.  Integument: Skin is warm, dry and supple bilateral. Nails are tender, long, thickened and dystrophic with subungual debris, consistent with onychomycosis, 1-5 bilateral. No signs of infection. No open lesions or preulcerative lesions present bilateral. Small pinch callus at distal tuft of left 4th toe and minimal reactive keratosis plantar left forefoot with no signs of infection.  Remaining integument unremarkable.  Vasculature:  Dorsalis Pedis pulse 1/4 bilateral. Posterior Tibial pulse  1/4 bilateral. Capillary fill time <3 sec 1-5 bilateral. Positive hair growth to the level of the digits.Temperature gradient within normal limits. No varicosities present bilateral. No edema present bilateral.   Neurology: The patient has intact sensation measured with a 5.07/10g Semmes Weinstein Monofilament at all pedal sites bilateral . Vibratory sensation diminished bilateral with tuning fork. No Babinski sign present bilateral.   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  Strength acceptable for patient status.  No tenderness with calf compression bilateral.  Assessment and Plan: Problem List Items Addressed This Visit      Endocrine   Type 2  diabetes, controlled, with neuropathy (HCC)    Other Visit Diagnoses    Pain due to onychomycosis of toenail    -  Primary   Anticoagulant long-term use       History of stroke         -Examined patient. -Discussed and educated patient on diabetic foot care, especially with regards to the vascular, neurological and musculoskeletal systems.  -Mechanically debrided all nails 1-5 bilateral using sterile nail nipper and filed with dremel without incident   -Smoothed callus using bur at tip of left 4th toe and ball of left foot without incident  Advised patient to start using foot cream or moisturizers for dry skin -Patient to return in 2.5 to 3 months for at  risk foot care -Patient advised to call the office if any problems or questions arise in the meantime.  Landis Martins, DPM

## 2020-02-25 ENCOUNTER — Telehealth: Payer: Self-pay | Admitting: Cardiology

## 2020-02-25 NOTE — Telephone Encounter (Signed)
Tried to call pt but they do not accept anonymous calls will route to pharmd pool as I am unable t contact pt

## 2020-02-25 NOTE — Telephone Encounter (Signed)
Patient called to report headaches, was wondering if related to Repatha use.  States headaches are recurrent, but don't seem to be related in any way to timing of her Repatha injections.  Assured her that Repatha should not cause headaches.  She will follow up with primary MD.

## 2020-02-25 NOTE — Telephone Encounter (Signed)
Patient calling to talk with someone in the lipid clinic

## 2020-04-02 ENCOUNTER — Telehealth: Payer: Self-pay | Admitting: Cardiology

## 2020-04-02 NOTE — Telephone Encounter (Signed)
Patient wanted to know if Repatha would help with her renal artery narrowing. Advised that there are no studies showing any benefit of the renal arteries

## 2020-04-02 NOTE — Telephone Encounter (Signed)
Pt c/o medication issue:  1. Name of Medication: Evolocumab [facility] SURECLICK) 140 MG/ML SOAJ [[id number]]   2. How are you currently taking this medication (dosage and times per day)? Evolocumab (REPATHA SURECLICK) 140 MG/ML SOAJ    3. Are you having a reaction (difficulty breathing--STAT)? No   4. What is your medication issue? Pt has some questions for the pharmacist about his med    Best number [phone]

## 2020-04-16 ENCOUNTER — Encounter: Payer: Self-pay | Admitting: Sports Medicine

## 2020-04-16 ENCOUNTER — Ambulatory Visit (INDEPENDENT_AMBULATORY_CARE_PROVIDER_SITE_OTHER): Payer: Medicare Other | Admitting: Sports Medicine

## 2020-04-16 ENCOUNTER — Other Ambulatory Visit: Payer: Self-pay

## 2020-04-16 DIAGNOSIS — B351 Tinea unguium: Secondary | ICD-10-CM

## 2020-04-16 DIAGNOSIS — E1140 Type 2 diabetes mellitus with diabetic neuropathy, unspecified: Secondary | ICD-10-CM | POA: Diagnosis not present

## 2020-04-16 DIAGNOSIS — Z8673 Personal history of transient ischemic attack (TIA), and cerebral infarction without residual deficits: Secondary | ICD-10-CM

## 2020-04-16 DIAGNOSIS — Z7901 Long term (current) use of anticoagulants: Secondary | ICD-10-CM

## 2020-04-16 DIAGNOSIS — M79676 Pain in unspecified toe(s): Secondary | ICD-10-CM

## 2020-04-16 NOTE — Progress Notes (Signed)
Subjective: Brandy Flowers is a 80 y.o. female patient with history of diabetes, not on any medications, who presents to office today complaining of long, painful nails and callus while ambulating in shoes; unable to trim. Patient states that the glucose reading this morning was 110, last A1c 6.1.  No other pedal complaints.   Last visit to PCP was last month.  Patient Active Problem List   Diagnosis Date Noted  . COPD with exacerbation (HCC)   . 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 Statin not tolerated 10/08/2019  . Thyroid disease   . Stroke (HCC)   . Sinus problem   . Sigmoid diverticulosis   . Migraines   . Hypothyroidism   . Hyperlipidemia   . Diabetes mellitus without complication (HCC)   . Arthritis   . Anxiety   . Renal artery stenosis (HCC)   . Hyponatremia   . Labile blood pressure   . 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 Oropharyngeal dysphagia   . Diastolic dysfunction   . Dysphagia, post-stroke   . Hypokalemia   . Stroke (cerebrum) (HCC) 12/17/2017  . Anxiety state 12/17/2017  . 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 Left renal artery stenosis (HCC) 12/05/2014  . 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 Hypertension 09/20/2013  . Hypothyroid 09/20/2013   Current Outpatient Medications on File Prior to Visit  Medication Sig Dispense Refill  . ACCU-CHEK GUIDE test strip CHECK BLOOD SUGAR DAILY DX  E11.9 50    . ACETAMINOPHEN-BUTALBITAL 50-325 MG TABS Take 1 tablet by mouth 3 (three) times daily as needed.    . Acetaminophen-Codeine 300-30 MG tablet Take by mouth.    Marland Kitchen aspirin 325 MG tablet Take 1 tablet (325 mg total) by mouth daily. (Patient taking differently: Take 325 mg by mouth daily. Takes 2 daily PRN)    . atenolol (TENORMIN) 50 MG tablet Take 50 mg by mouth 2 (two) times daily.    . Continuous Blood Gluc Receiver (FREESTYLE LIBRE 2 READER) DEVI     . Continuous Blood Gluc Sensor (FREESTYLE LIBRE 2 SENSOR) MISC      . diclofenac (FLECTOR) 1.3 % PTCH Place 1 patch onto the skin daily as needed.     . diclofenac Sodium (VOLTAREN) 1 % GEL Apply 2 g topically 4 (four) times daily.    Marland Kitchen eplerenone (INSPRA) 50 MG tablet Take 50 mg by mouth daily.    . Evolocumab (REPATHA SURECLICK) 140 MG/ML SOAJ Inject 140 mg into the skin every 14 (fourteen) days. (Patient not taking: Reported on 01/03/2020) 2 mL 11  . gabapentin (NEURONTIN) 100 MG capsule Take 1 capsule (100 mg total) by mouth at bedtime. (Patient taking differently: Take 100 mg by mouth in the morning. ) 3 capsule 0  . gabapentin (NEURONTIN) 300 MG capsule Take 300 mg by mouth at bedtime.    Marland Kitchen ketoconazole (NIZORAL) 2 % shampoo Apply topically daily.    Marland Kitchen lidocaine (LIDODERM) 5 % APPLY 1 PATCH TO INTACT SKIN REMOVE AFTER 12 HOURS ONCE A DAY TO NECK AS NEEDED (Patient not taking: Reported on 01/03/2020)    . LORazepam (ATIVAN) 1 MG tablet Take 1 mg by mouth 3 (three) times daily as needed.    . meclizine (ANTIVERT) 25 MG tablet Take 25 mg by mouth 2 (two) times daily as needed for dizziness.    Marland Kitchen olmesartan (BENICAR) 40 MG tablet Take 40 mg by mouth daily.    . predniSONE (DELTASONE) 20 MG tablet Take  20 mg by mouth 2 (two) times daily.    Marland Kitchen SYNTHROID 50 MCG tablet Take 100 mcg by mouth daily before breakfast.      No current facility-administered medications on file prior to visit.   Allergies  Allergen Reactions  . Amiloride Shortness Of Breath  . Blue Dyes (Parenteral) Shortness Of Breath, Rash and Other (See Comments)    Joint pain  . Inderal La [Propranolol Hcl] Shortness Of Breath  . Sulfa Antibiotics Anaphylaxis  . Ace Inhibitors Nausea And Vomiting  . Norvasc [Amlodipine] Nausea And Vomiting  . Calcium Channel Blockers Diarrhea  . Catapres [Clonidine Hcl]     Decreased mental clarity   . Clonidine Derivatives Other (See Comments)    Headache, flu like symptoms  . Diovan [Valsartan] Cough  . Hydralazine Other (See Comments)    Headache   . Metformin And Related Diarrhea  . Micardis [Telmisartan] Other (See Comments)    Hallucinations, increase in BP  . Minoxidil Other (See Comments)    Fast heart rate, chest pain  . Plavix [Clopidogrel]     Increased LFT's   . Repatha [Evolocumab]   . Serotonin Reuptake Inhibitors (Ssris) Other (See Comments)    Headache  . Topamax [Topiramate] Other (See Comments)    Confusion  . Tramadol     Nausea as a dog per pt  . Tylenol With Codeine #3 [Acetaminophen-Codeine] Other (See Comments)    Lightheaded  . Zetia [Ezetimibe]     Liver  Function increase labs  . Amoxicillin Diarrhea  . Chlorthalidone     Dizziness.   . Doxycycline Diarrhea  . Fosamax [Alendronate Sodium]     Pt's teeth fell out while taking.   . Lexapro [Escitalopram]     Brain Fog   . Spironolactone Palpitations  . Statins     Upset stomach     No results found for this or any previous visit (from the past 2160 hour(s)).  Objective: General: Patient is awake, alert and in no acute distress.  Using rolling walker.  Integument: Skin is warm, dry and supple bilateral. Nails are tender, long, thickened and dystrophic with subungual debris, consistent with onychomycosis, 1-5 bilateral. No signs of infection. No open lesions or preulcerative lesions present bilateral. Small pinch callus at distal tuft of left 4th toe and minimal reactive keratosis plantar left forefoot with no signs of infection.  Remaining integument unremarkable.  Vasculature:  Dorsalis Pedis pulse 1/4 bilateral. Posterior Tibial pulse  1/4 bilateral. Capillary fill time <3 sec 1-5 bilateral. Positive hair growth to the level of the digits.Temperature gradient within normal limits. No varicosities present bilateral. No edema present bilateral.   Neurology: The patient has intact sensation measured with a 5.07/10g Semmes Weinstein Monofilament at all pedal sites bilateral . Vibratory sensation diminished bilateral with tuning fork. No Babinski sign  present bilateral.   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  Strength acceptable for patient status.  No tenderness with calf compression bilateral.  Assessment and Plan: Problem List Items Addressed This Visit      Endocrine   Type 2 diabetes, controlled, with neuropathy (HCC)    Other Visit Diagnoses    Pain due to onychomycosis of toenail    -  Primary   Anticoagulant long-term use       History of stroke         -Examined patient. -Re-Discussed and educated patient on diabetic foot care, especially with regards to the vascular, neurological and musculoskeletal  systems.  -Mechanically debrided all nails 1-5 bilateral using sterile nail nipper and filed with dremel without incident   -Smoothed callus using bur at tip of left 4th toe and ball of left foot without incident  Advised patient to start using foot cream or moisturizers for dry skin -Patient to return in 2.5 to 3 months for at risk foot care -Patient advised to call the office if any problems or questions arise in the meantime.  Asencion Islam, DPM

## 2020-05-26 ENCOUNTER — Ambulatory Visit: Payer: Medicare Other

## 2020-05-26 NOTE — Progress Notes (Signed)
   Covid-19 Vaccination Clinic  Name:  Kristyn Obyrne    MRN: [id number] DOB: [birth year]  05/26/2020  Ms. Allbee was observed post Covid-19 immunization for 15 minutes without incident. She was provided with Vaccine Information Sheet and instruction to access the V-Safe system.   Ms. Cyr was instructed to call 911 with any severe reactions post vaccine: Marland Kitchen Difficulty breathing  . Swelling of face and throat  . A fast heartbeat  . A bad rash all over body  . Dizziness and weakness

## 2020-07-10 ENCOUNTER — Other Ambulatory Visit: Payer: Self-pay | Admitting: *Deleted

## 2020-07-10 DIAGNOSIS — I701 Atherosclerosis of renal artery: Secondary | ICD-10-CM

## 2020-07-18 ENCOUNTER — Encounter: Payer: Self-pay | Admitting: Sports Medicine

## 2020-07-18 ENCOUNTER — Other Ambulatory Visit: Payer: Self-pay

## 2020-07-18 ENCOUNTER — Ambulatory Visit (INDEPENDENT_AMBULATORY_CARE_PROVIDER_SITE_OTHER): Payer: Medicare Other | Admitting: Sports Medicine

## 2020-07-18 DIAGNOSIS — E1140 Type 2 diabetes mellitus with diabetic neuropathy, unspecified: Secondary | ICD-10-CM

## 2020-07-18 DIAGNOSIS — B351 Tinea unguium: Secondary | ICD-10-CM

## 2020-07-18 DIAGNOSIS — Z8673 Personal history of transient ischemic attack (TIA), and cerebral infarction without residual deficits: Secondary | ICD-10-CM | POA: Diagnosis not present

## 2020-07-18 DIAGNOSIS — Z7901 Long term (current) use of anticoagulants: Secondary | ICD-10-CM | POA: Diagnosis not present

## 2020-07-18 DIAGNOSIS — M79676 Pain in unspecified toe(s): Secondary | ICD-10-CM

## 2020-07-18 NOTE — Progress Notes (Signed)
Subjective: Brandy Flowers is a 80 y.o. female patient with history of diabetes, not on any medications, who presents to office today complaining of long, painful nails and callus while ambulating in shoes; unable to trim. Patient states that the glucose reading this morning was not recorded.  Patient also reports that she is having issues with headaches and has medication that was prescribed by her PCP but makes her dizzy and will be following up soon with her neurologist.  No other concerns noted.  Patient Active Problem List   Diagnosis Date Noted  . COPD with exacerbation (HCC)   . 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 Statin not tolerated 10/08/2019  . Thyroid disease   . Stroke (HCC)   . Sinus problem   . Sigmoid diverticulosis   . Migraines   . Hypothyroidism   . Hyperlipidemia   . Diabetes mellitus without complication (HCC)   . Arthritis   . Anxiety   . Renal artery stenosis (HCC)   . Hyponatremia   . Labile blood pressure   . 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 Oropharyngeal dysphagia   . Diastolic dysfunction   . Dysphagia, post-stroke   . Hypokalemia   . Stroke (cerebrum) (HCC) 12/17/2017  . Anxiety state 12/17/2017  . 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 Left renal artery stenosis (HCC) 12/05/2014  . 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 Hypertension 09/20/2013  . Hypothyroid 09/20/2013   Current Outpatient Medications on File Prior to Visit  Medication Sig Dispense Refill  . ACCU-CHEK GUIDE test strip CHECK BLOOD SUGAR DAILY DX  E11.9 50    . ACETAMINOPHEN-BUTALBITAL 50-325 MG TABS Take 1 tablet by mouth 3 (three) times daily as needed.    . Acetaminophen-Codeine 300-30 MG tablet Take by mouth.    Marland Kitchen aspirin 325 MG tablet Take 1 tablet (325 mg total) by mouth daily. (Patient taking differently: Take 325 mg by mouth daily. Takes 2 daily PRN)    . atenolol (TENORMIN) 50 MG tablet Take 50 mg by mouth 2 (two) times  daily.    . Continuous Blood Gluc Receiver (FREESTYLE LIBRE 2 READER) DEVI     . Continuous Blood Gluc Sensor (FREESTYLE LIBRE 2 SENSOR) MISC     . diclofenac (FLECTOR) 1.3 % PTCH Place 1 patch onto the skin daily as needed.     . diclofenac Sodium (VOLTAREN) 1 % GEL Apply 2 g topically 4 (four) times daily.    Marland Kitchen eplerenone (INSPRA) 50 MG tablet Take 50 mg by mouth daily.    . Evolocumab (REPATHA SURECLICK) 140 MG/ML SOAJ Inject 140 mg into the skin every 14 (fourteen) days. (Patient not taking: Reported on 01/03/2020) 2 mL 11  . gabapentin (NEURONTIN) 100 MG capsule Take 1 capsule (100 mg total) by mouth at bedtime. (Patient taking differently: Take 100 mg by mouth in the morning. ) 3 capsule 0  . gabapentin (NEURONTIN) 300 MG capsule Take 300 mg by mouth at bedtime.    Marland Kitchen ketoconazole (NIZORAL) 2 % shampoo Apply topically daily.    Marland Kitchen lidocaine (LIDODERM) 5 % APPLY 1 PATCH TO INTACT SKIN REMOVE AFTER 12 HOURS ONCE A DAY TO NECK AS NEEDED (Patient not taking: Reported on 01/03/2020)    . LORazepam (ATIVAN) 1 MG tablet Take 1 mg by mouth 3 (three) times daily as needed.    . meclizine (ANTIVERT) 25 MG tablet Take 25 mg by mouth 2 (two) times daily as needed for dizziness.    Marland Kitchen  olmesartan (BENICAR) 40 MG tablet Take 40 mg by mouth daily.    . predniSONE (DELTASONE) 20 MG tablet Take 20 mg by mouth 2 (two) times daily.    Marland Kitchen SYNTHROID 50 MCG tablet Take 100 mcg by mouth daily before breakfast.      No current facility-administered medications on file prior to visit.   Allergies  Allergen Reactions  . Amiloride Shortness Of Breath  . Blue Dyes (Parenteral) Shortness Of Breath, Rash and Other (See Comments)    Joint pain  . Inderal La [Propranolol Hcl] Shortness Of Breath  . Sulfa Antibiotics Anaphylaxis  . Ace Inhibitors Nausea And Vomiting  . Norvasc [Amlodipine] Nausea And Vomiting  . Calcium Channel Blockers Diarrhea  . Catapres [Clonidine Hcl]     Decreased mental clarity   . Clonidine  Derivatives Other (See Comments)    Headache, flu like symptoms  . Diovan [Valsartan] Cough  . Hydralazine Other (See Comments)    Headache  . Metformin And Related Diarrhea  . Micardis [Telmisartan] Other (See Comments)    Hallucinations, increase in BP  . Minoxidil Other (See Comments)    Fast heart rate, chest pain  . Plavix [Clopidogrel]     Increased LFT's   . Repatha [Evolocumab]   . Serotonin Reuptake Inhibitors (Ssris) Other (See Comments)    Headache  . Topamax [Topiramate] Other (See Comments)    Confusion  . Tramadol     Nausea as a dog per pt  . Tylenol With Codeine #3 [Acetaminophen-Codeine] Other (See Comments)    Lightheaded  . Zetia [Ezetimibe]     Liver  Function increase labs  . Amoxicillin Diarrhea  . Chlorthalidone     Dizziness.   . Doxycycline Diarrhea  . Fosamax [Alendronate Sodium]     Pt's teeth fell out while taking.   . Lexapro [Escitalopram]     Brain Fog   . Spironolactone Palpitations  . Statins     Upset stomach     No results found for this or any previous visit (from the past 2160 hour(s)).  Objective: General: Patient is awake, alert and in no acute distress.  Using rolling walker.  Integument: Skin is warm, dry and supple bilateral. Nails are tender, long, thickened and dystrophic with subungual debris, consistent with onychomycosis, 1-5 bilateral. No signs of infection. No open lesions or preulcerative lesions present bilateral. Small pinch callus at distal tuft of left 4th toe and minimal reactive keratosis plantar left forefoot with no signs of infection.  Remaining integument unremarkable.  Vasculature:  Dorsalis Pedis pulse 1/4 bilateral. Posterior Tibial pulse  1/4 bilateral. Capillary fill time <3 sec 1-5 bilateral. Positive hair growth to the level of the digits.Temperature gradient within normal limits. No varicosities present bilateral. No edema present bilateral.   Neurology: The patient has intact sensation measured with a  5.07/10g Semmes Weinstein Monofilament at all pedal sites bilateral . Vibratory sensation diminished bilateral with tuning fork. No Babinski sign present bilateral.   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  Strength acceptable for patient status.  No tenderness with calf compression bilateral.  Assessment and Plan: Problem List Items Addressed This Visit      Endocrine   Type 2 diabetes, controlled, with neuropathy (HCC)    Other Visit Diagnoses    Pain due to onychomycosis of toenail    -  Primary   Anticoagulant long-term use       History of stroke         -  Examined patient. -Re-Discussed and educated patient on diabetic foot care, especially with regards to the vascular, neurological and musculoskeletal systems.  -Mechanically debrided all nails 1-5 bilateral using sterile nail nipper and filed with dremel without incident   -Smoothed callus using bur at tip of left 4th toe and ball of left foot without incident  Advised patient to start using foot cream or moisturizers for dry skin -Patient to return in 2.5 to 3 months for at risk foot care -Patient advised to call the office if any problems or questions arise in the meantime.  Asencion Islam, DPM

## 2020-09-01 ENCOUNTER — Ambulatory Visit: Payer: Medicare Other

## 2020-09-01 ENCOUNTER — Encounter (HOSPITAL_COMMUNITY): Payer: Medicare Other

## 2020-09-01 NOTE — Progress Notes (Deleted)
HISTORY AND PHYSICAL     CC:  follow up Requesting Provider:  Philemon KingdomProchnau, Caroline, MD  HPI: Brandy GarnetBarbara Flowers is a 80 y.o. (04/12/1940) female who presents with hx of RAS.  She was last seen by Dr. Darrick PennaFields in June 2020 via telephone visit.  At that time, her BP was fairly well controlled.    She would occasionally have systolic BP in the 140's-150's range, but usually stayed below this.  She was on asa.  She was taken off her Plavix due to concerns about liver function.  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  She had good control of her DM.  She was walking on a regular basis, which was helpful for her mental status and physical well being.  She comes in today for follow up.  She states that her blood pressure is elevated today but mostly runs in the 120's at home.  She states it has been higher with her thyroid issues.  She states she is now on Olmesartan daily and atenolol twice daily with good control.  She states that she has left leg pain and was told it was due to her stroke.  She does not have any non healing wounds.    Pt states she graduated from [employer] with 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    The pt is not on a statin for cholesterol management.  allergy The pt is on a daily aspirin.   Other AC:  none The pt is on BB and ARB for hypertension.   The pt is  diabetic.   Tobacco hx:  never  Pt does not have family hx of AAA.  Past Medical History:  Diagnosis Date   Anxiety    Arthritis    COPD with exacerbation (HCC)    Diabetes mellitus without complication (HCC)    Diastolic dysfunction    Grade 1   Hyperlipidemia    Hypertension    Hypothyroidism    Migraines    Renal artery stenosis (HCC)    Left 50%   Sigmoid diverticulosis    Sinus problem    Splenic artery aneurysm (HCC)    7mm   Stroke [facility](HCC)    Thyroid disease     Past Surgical History:   Procedure Laterality Date   BREAST REDUCTION SURGERY  2007   CATARACT EXTRACTION Bilateral    CHOLECYSTECTOMY      Social History   Socioeconomic History   Marital status: Widowed    Spouse name: Not on file   Number of children: Not on file   Years of education: Not on file   Highest education level: Not on file  Occupational History   Not on file  Tobacco Use   Smoking status: Never   Smokeless tobacco: Never  Vaping Use   Vaping Use: Never used  Substance and Sexual Activity   Alcohol use: No   Drug use: No   Sexual activity: Not Currently  Other Topics Concern   Not on file  Social History Narrative   Not on file   Social Determinants of Health   Financial Resource Strain: Not on file  Food Insecurity: Not on file  Transportation Needs: Not on file  Physical Activity: Not on file  Stress: Not on file  Social Connections: Not on file  Intimate Partner Violence: Not on file    Family History  Problem Relation Age of Onset   Stroke Mother    Diabetes Mother    Heart disease Mother        Before age 23   Hypertension Mother    Hyperlipidemia Mother    Heart disease Father    Heart attack Father    Hypertension Father    Heart disease Brother        After age 32- CABG   Hypertension Brother     Current Outpatient Medications  Medication Sig Dispense Refill   ACCU-CHEK GUIDE test strip CHECK BLOOD SUGAR DAILY DX  E11.9 50     ACETAMINOPHEN-BUTALBITAL 50-325 MG TABS Take 1 tablet by mouth 3 (three) times daily as needed.     Acetaminophen-Codeine 300-30 MG tablet Take by mouth.     aspirin 325 MG tablet Take 1 tablet (325 mg total) by mouth daily. (Patient taking differently: Take 325 mg by mouth daily. Takes 2 daily PRN)     atenolol (TENORMIN) 50 MG tablet Take 50 mg by mouth 2 (two) times daily.     Continuous Blood Gluc Receiver (FREESTYLE LIBRE 2 READER) DEVI      Continuous Blood Gluc Sensor (FREESTYLE LIBRE 2 SENSOR) MISC      diclofenac  (FLECTOR) 1.3 % PTCH Place 1 patch onto the skin daily as needed.      diclofenac Sodium (VOLTAREN) 1 % GEL Apply 2 g topically 4 (four) times daily.     eplerenone (INSPRA) 50 MG tablet Take 50 mg by mouth daily.     Evolocumab (REPATHA SURECLICK) 140 MG/ML SOAJ Inject 140 mg into the skin every 14 (fourteen) days. (Patient not taking: Reported on 01/03/2020) 2 mL 11   gabapentin (NEURONTIN) 100 MG capsule Take 1 capsule (100 mg total) by mouth at bedtime. (Patient taking differently: Take 100 mg by mouth in the morning. ) 3 capsule 0   gabapentin (NEURONTIN) 300 MG capsule Take 300 mg by mouth at bedtime.     ketoconazole (NIZORAL) 2 % shampoo Apply topically daily.     lidocaine (LIDODERM) 5 % APPLY 1 PATCH TO INTACT SKIN REMOVE AFTER 12 HOURS ONCE A DAY TO NECK AS NEEDED (Patient not taking: Reported on 01/03/2020)     LORazepam (ATIVAN) 1 MG tablet Take 1 mg by mouth 3 (three) times daily as needed.     meclizine (ANTIVERT) 25 MG tablet Take 25 mg by mouth 2 (two) times daily as needed for dizziness.     olmesartan (BENICAR) 40 MG tablet Take 40 mg by mouth daily.     predniSONE (DELTASONE) 20 MG tablet Take 20 mg by mouth 2 (two) times daily.     SYNTHROID 50 MCG tablet Take 100 mcg by mouth daily before breakfast.      No current facility-administered medications for this visit.    Allergies  Allergen Reactions   Amiloride Shortness Of Breath   Blue Dyes (Parenteral) Shortness Of Breath, Rash and Other (See Comments)    Joint pain   Inderal La [Propranolol Hcl] Shortness Of Breath   Sulfa Antibiotics Anaphylaxis   Ace Inhibitors Nausea And Vomiting   Norvasc [Amlodipine] Nausea And Vomiting   Calcium Channel Blockers Diarrhea   Catapres [Clonidine Hcl]     Decreased mental clarity    Clonidine Derivatives Other (See Comments)    Headache, flu like symptoms   Diovan [Valsartan] Cough   Hydralazine Other (See Comments)    Headache   Metformin And Related Diarrhea   Micardis  [  Telmisartan] Other (See Comments)    Hallucinations, increase in BP   Minoxidil Other (See Comments)    Fast heart rate, chest pain   Plavix [Clopidogrel]     Increased LFT's    Repatha [Evolocumab]    Serotonin Reuptake Inhibitors (Ssris) Other (See Comments)    Headache   Topamax [Topiramate] Other (See Comments)    Confusion   Tramadol     Nausea as a dog per pt   Tylenol With Codeine #3 [Acetaminophen-Codeine] Other (See Comments)    Lightheaded   Zetia [Ezetimibe]     Liver  Function increase labs   Amoxicillin Diarrhea   Chlorthalidone     Dizziness.    Doxycycline Diarrhea   Fosamax [Alendronate Sodium]     Pt's teeth fell out while taking.    Lexapro [Escitalopram]     Brain Fog    Spironolactone Palpitations   Statins     Upset stomach      REVIEW OF SYSTEMS:   [X]  denotes positive finding, [ ]  denotes negative finding Cardiac  Comments:  Chest pain or chest pressure:    Shortness of breath upon exertion:    Short of breath when lying flat:    Irregular heart rhythm:        Vascular    Pain in calf, thigh, or hip brought on by ambulation:    Pain in feet at night that wakes you up from your sleep:     Blood clot in your veins:    Leg swelling:         Pulmonary    Oxygen at home:    Productive cough:     Wheezing:         Neurologic    Sudden weakness in arms or legs:     Sudden numbness in arms or legs:     Sudden onset of difficulty speaking or slurred speech:    Temporary loss of vision in one eye:     Problems with dizziness:  x       Gastrointestinal    Blood in stool:     Vomited blood:         Genitourinary    Burning when urinating:     Blood in urine:        Psychiatric    Major depression:         Hematologic    Bleeding problems:    Problems with blood clotting too easily:        Skin    Rashes or ulcers:        Constitutional    Fever or chills:      PHYSICAL EXAMINATION:  There were no vitals filed for this  visit.  There is no height or weight on file to calculate BMI.   General:  WDWN in NAD; vital signs documented above Gait: Not observed HENT: WNL, normocephalic Pulmonary: normal non-labored breathing Cardiac: regular HR, without  Murmur without carotid bruits Abdomen: soft, NT, no masses Skin: without rashes Vascular Exam/Pulses:  Right Left  Radial 2+ (normal) 2+ (normal)  Popliteal Unable to palpate  Unable to palpate   DP 2+ (normal) 2+ (normal)  PT 2+ (normal) 2+ (normal)   Extremities: without ischemic changes, without Gangrene , without cellulitis; without open wounds;  Musculoskeletal: no muscle wasting or atrophy  Neurologic: A&O X 3;  No focal weakness or paresthesias are detected Psychiatric:  The pt has Normal affect.   Non-Invasive Vascular Imaging on 08/29/2019:  Renal artery duplex 08/29/2019: Right: RRV flow present. 1-59% stenosis of the right renal artery.  Left:  LRV flow present. 1-59% stenosis of the left renal artery    ASSESSMENT/PLAN:: 80 y.o. female here for follow up for renal artery stenosis   -pt doing well with reasonably controlled blood pressure.  Her renal artery duplex today is essentially unchanged from previous with 1-59% stenosis bilaterally.   Dr. Darrick Penna would only consider intervention with velocities indicating stenosis greater than 70% with additions of multiple blood pressure medication or decline in renal function. -she will f/u in one year with renal artery duplex and see PA on Dr. Darrick Penna clinic day.  -left leg pain-pt has easily palpable pedal pulses bilaterally.   -she will call sooner should she have any issues before then.  Rande Brunt. Lenell Antu, MD [facility] Phone Number: [phone] 09/01/2020 8:08 PM

## 2020-09-02 ENCOUNTER — Ambulatory Visit (HOSPITAL_COMMUNITY)
Admission: RE | Admit: 2020-09-02 | Discharge: 2020-09-02 | Disposition: A | Discharge status: Home / Self Care | Payer: Medicare Other | Source: Ambulatory Visit | Attending: Vascular Surgery | Admitting: Vascular Surgery

## 2020-09-02 ENCOUNTER — Other Ambulatory Visit: Payer: Self-pay

## 2020-09-02 ENCOUNTER — Ambulatory Visit: Payer: Medicare Other | Admitting: Vascular Surgery

## 2020-09-02 DIAGNOSIS — I701 Atherosclerosis of renal artery: Secondary | ICD-10-CM | POA: Diagnosis present

## 2020-09-09 ENCOUNTER — Ambulatory Visit (INDEPENDENT_AMBULATORY_CARE_PROVIDER_SITE_OTHER): Payer: Medicare Other | Admitting: Vascular Surgery

## 2020-09-09 ENCOUNTER — Encounter: Payer: Self-pay | Admitting: Vascular Surgery

## 2020-09-09 ENCOUNTER — Other Ambulatory Visit: Payer: Self-pay

## 2020-09-09 VITALS — BP 153/72 | HR 69 | Temp 98.1°F | Resp 16 | Ht 61.0 in | Wt 113.0 lb

## 2020-09-09 DIAGNOSIS — I701 Atherosclerosis of renal artery: Secondary | ICD-10-CM

## 2020-09-09 NOTE — Progress Notes (Signed)
HISTORY AND PHYSICAL     CC:  follow up Requesting Provider:  Philemon Kingdom, MD  HPI: Brandy Flowers is a 80 y.o. (May 18, 1940) female who presents with hx of RAS.  She was last seen by Dr. Darrick Penna in June 2020 via telephone visit.  At that time, her BP was fairly well controlled.    She would occasionally have systolic BP in the 140's-150's range, but usually stayed below this.  She was on asa.  She was taken off her Plavix due to concerns about liver function.  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  She had good control of her DM.  She was walking on a regular basis, which was helpful for her mental status and physical well being.  She comes in today for follow up.  She continues to enjoy good control of her blood pressure.  She has no issues with renal function.  She has started Repatha with good response of her LDL.  I counseled the patient extensively about the benign nature of her serial duplex findings.  She is still interested in continued surveillance  Pt states she graduated from [employer] with 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    The pt is not on a statin for cholesterol management.  allergy The pt is on a daily aspirin.   Other AC:  none The pt is on BB and ARB for hypertension.   The pt is  diabetic.   Tobacco hx:  never  Pt does not have family hx of AAA.  Past Medical History:  Diagnosis Date   Anxiety    Arthritis    COPD with exacerbation (HCC)    Diabetes mellitus without complication (HCC)    Diastolic dysfunction    Grade 1   Hyperlipidemia    Hypertension    Hypothyroidism    Migraines    Renal artery stenosis (HCC)    Left 50%   Sigmoid diverticulosis    Sinus problem    Splenic artery aneurysm (HCC)    88mm   Stroke [facility])    Thyroid disease     Past Surgical History:  Procedure Laterality Date   BREAST REDUCTION SURGERY  2007    CATARACT EXTRACTION Bilateral    CHOLECYSTECTOMY      Social History   Socioeconomic History   Marital status: Widowed    Spouse name: Not on file   Number of children: Not on file   Years of education: Not on file   Highest education level: Not on file  Occupational History   Not on file  Tobacco Use   Smoking status: Never   Smokeless tobacco: Never  Vaping Use   Vaping Use: Never used  Substance and Sexual Activity   Alcohol use: No   Drug use: No   Sexual activity: Not Currently  Other Topics Concern   Not on file  Social History Narrative   Not on file   Social Determinants of Health   Financial Resource Strain: Not on file  Food Insecurity: Not on file  Transportation Needs: Not on file  Physical Activity: Not on file  Stress: Not on file  Social Connections: Not on file  Intimate Partner Violence: Not on file    Family History  Problem Relation Age of Onset   Stroke Mother    Diabetes Mother    Heart disease Mother  Before age 59   Hypertension Mother    Hyperlipidemia Mother    Heart disease Father    Heart attack Father    Hypertension Father    Heart disease Brother        After age 27- CABG   Hypertension Brother     Current Outpatient Medications  Medication Sig Dispense Refill   ACETAMINOPHEN-BUTALBITAL 50-325 MG TABS Take 1 tablet by mouth 3 (three) times daily as needed.     Acetaminophen-Codeine 300-30 MG tablet Take by mouth.     aspirin 325 MG tablet Take 1 tablet (325 mg total) by mouth daily. (Patient taking differently: Take 325 mg by mouth daily. Takes 2 daily PRN)     atenolol (TENORMIN) 50 MG tablet Take 50 mg by mouth 2 (two) times daily.     Continuous Blood Gluc Receiver (FREESTYLE LIBRE 2 READER) DEVI      Continuous Blood Gluc Sensor (FREESTYLE LIBRE 2 SENSOR) MISC      diclofenac (FLECTOR) 1.3 % PTCH Place 1 patch onto the skin daily as needed.      diclofenac Sodium (VOLTAREN) 1 % GEL Apply 2 g topically 4 (four)  times daily.     eplerenone (INSPRA) 50 MG tablet Take 50 mg by mouth daily.     Evolocumab (REPATHA SURECLICK) 140 MG/ML SOAJ Inject 140 mg into the skin every 14 (fourteen) days. 2 mL 11   ketoconazole (NIZORAL) 2 % shampoo Apply topically daily.     lidocaine (LIDODERM) 5 % APPLY 1 PATCH TO INTACT SKIN REMOVE AFTER 12 HOURS ONCE A DAY TO NECK AS NEEDED     LORazepam (ATIVAN) 1 MG tablet Take 1 mg by mouth 3 (three) times daily as needed.     meclizine (ANTIVERT) 25 MG tablet Take 25 mg by mouth 2 (two) times daily as needed for dizziness.     olmesartan (BENICAR) 40 MG tablet Take 40 mg by mouth daily.     SYNTHROID 50 MCG tablet Take 100 mcg by mouth daily before breakfast.      No current facility-administered medications for this visit.    Allergies  Allergen Reactions   Amiloride Shortness Of Breath   Blue Dyes (Parenteral) Shortness Of Breath, Rash and Other (See Comments)    Joint pain   Inderal La [Propranolol Hcl] Shortness Of Breath   Sulfa Antibiotics Anaphylaxis   Ace Inhibitors Nausea And Vomiting   Norvasc [Amlodipine] Nausea And Vomiting   Calcium Channel Blockers Diarrhea   Catapres [Clonidine Hcl]     Decreased mental clarity    Clonidine Derivatives Other (See Comments)    Headache, flu like symptoms   Diovan [Valsartan] Cough   Hydralazine Other (See Comments)    Headache   Metformin And Related Diarrhea   Micardis [Telmisartan] Other (See Comments)    Hallucinations, increase in BP   Minoxidil Other (See Comments)    Fast heart rate, chest pain   Plavix [Clopidogrel]     Increased LFT's    Repatha [Evolocumab]    Serotonin Reuptake Inhibitors (Ssris) Other (See Comments)    Headache   Topamax [Topiramate] Other (See Comments)    Confusion   Tramadol     Nausea as a dog per pt   Tylenol With Codeine #3 [Acetaminophen-Codeine] Other (See Comments)    Lightheaded   Zetia [Ezetimibe]     Liver  Function increase labs   Amoxicillin Diarrhea    Chlorthalidone     Dizziness.    Doxycycline  Diarrhea   Fosamax [Alendronate Sodium]     Pt's teeth fell out while taking.    Lexapro [Escitalopram]     Brain Fog    Spironolactone Palpitations   Statins     Upset stomach      REVIEW OF SYSTEMS:   [X]  denotes positive finding, [ ]  denotes negative finding Cardiac  Comments:  Chest pain or chest pressure:    Shortness of breath upon exertion:    Short of breath when lying flat:    Irregular heart rhythm:        Vascular    Pain in calf, thigh, or hip brought on by ambulation:    Pain in feet at night that wakes you up from your sleep:     Blood clot in your veins:    Leg swelling:         Pulmonary    Oxygen at home:    Productive cough:     Wheezing:         Neurologic    Sudden weakness in arms or legs:     Sudden numbness in arms or legs:     Sudden onset of difficulty speaking or slurred speech:    Temporary loss of vision in one eye:     Problems with dizziness:  x       Gastrointestinal    Blood in stool:     Vomited blood:         Genitourinary    Burning when urinating:     Blood in urine:        Psychiatric    Major depression:         Hematologic    Bleeding problems:    Problems with blood clotting too easily:        Skin    Rashes or ulcers:        Constitutional    Fever or chills:      PHYSICAL EXAMINATION:  Today's Vitals   09/09/20 1449  BP: (!) 153/72  Pulse: 69  Resp: 16  Temp: 98.1 F (36.7 C)  SpO2: 93%  Weight: 113 lb (51.3 kg)  Height: 5\' 1"  (1.549 m)    Body mass index is 21.35 kg/m.   General:  WDWN in NAD; vital signs documented above Gait: Not observed HENT: WNL, normocephalic Pulmonary: normal non-labored breathing Cardiac: regular HR, without  Murmur without carotid bruits Abdomen: soft, NT, no masses Skin: without rashes Vascular Exam/Pulses:  Right Left  DP 2+ (normal) 2+ (normal)  PT 2+ (normal) 2+ (normal)   Extremities: without ischemic  changes, without Gangrene , without cellulitis; without open wounds;  Musculoskeletal: no muscle wasting or atrophy  Neurologic: A&O X 3;  No focal weakness or paresthesias are detected Psychiatric:  The pt has Normal affect.   Non-Invasive Vascular Imaging on 08/29/2019:   Renal artery duplex 08/29/2019: No evidence of right renal artery stenosis.  Borderline elevated velocities Left renal artery has mild (1-59%) cyst.    ASSESSMENT/PLAN:: 80 y.o. female here for follow up for renal artery stenosis.  I counseled the patient extensively that she does not need continued surveillance for renal artery stenosis.  She prefers yearly surveillance.  We will have her return in 1 year with renal artery duplex.  08/31/2019. [phone], MD [facility] Phone Number: [phone] 09/09/2020 2:58 PM

## 2020-09-15 DIAGNOSIS — E7800 Pure hypercholesterolemia, unspecified: Secondary | ICD-10-CM

## 2020-09-15 NOTE — Progress Notes (Signed)
This encounter was created in error - please disregard.

## 2020-10-21 ENCOUNTER — Other Ambulatory Visit: Payer: Self-pay

## 2020-10-21 ENCOUNTER — Encounter: Payer: Self-pay | Admitting: Sports Medicine

## 2020-10-21 ENCOUNTER — Ambulatory Visit (INDEPENDENT_AMBULATORY_CARE_PROVIDER_SITE_OTHER): Payer: Medicare Other | Admitting: Sports Medicine

## 2020-10-21 DIAGNOSIS — B351 Tinea unguium: Secondary | ICD-10-CM | POA: Diagnosis not present

## 2020-10-21 DIAGNOSIS — E1140 Type 2 diabetes mellitus with diabetic neuropathy, unspecified: Secondary | ICD-10-CM

## 2020-10-21 DIAGNOSIS — Z7901 Long term (current) use of anticoagulants: Secondary | ICD-10-CM

## 2020-10-21 DIAGNOSIS — M79676 Pain in unspecified toe(s): Secondary | ICD-10-CM | POA: Diagnosis not present

## 2020-10-21 DIAGNOSIS — Z8673 Personal history of transient ischemic attack (TIA), and cerebral infarction without residual deficits: Secondary | ICD-10-CM

## 2020-10-21 NOTE — Progress Notes (Signed)
Subjective: Brandy Flowers is a 80 y.o. female patient with history of diabetes, not on any medications, who presents to office today complaining of long, painful nails and callus while ambulating in shoes; unable to trim. Patient states that the glucose reading this morning was 115 last A1c 6.5 and last visit to PCP 2 months ago.  Patient also reports that she is going to see a neurologist having issues with headaches and a rash that her PCP is treating.  No other concerns noted.  Patient Active Problem List   Diagnosis Date Noted   COPD with exacerbation (HCC)    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   Statin not tolerated 10/08/2019   Thyroid disease    Stroke [facility])    Sinus problem    Sigmoid diverticulosis    Migraines    Hypothyroidism    Hyperlipidemia    Diabetes mellitus without complication (HCC)    Arthritis    Anxiety    Renal artery stenosis (HCC)    Hyponatremia    Labile blood pressure    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   Oropharyngeal dysphagia    Diastolic dysfunction    Dysphagia, post-stroke    Hypokalemia    Stroke (cerebrum) (HCC) 12/17/2017   Anxiety state 12/17/2017   Splenic artery aneurysm (HCC) 12/05/2014   Left renal artery stenosis (HCC) 12/05/2014   Type 2 diabetes, controlled, 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     Acetaminophen-Codeine 300-30 MG tablet Take by mouth.     aspirin 325 MG tablet Take 1 tablet (325 mg total) by mouth daily. (Patient taking differently: Take 325 mg by mouth daily. Takes 2 daily PRN)     atenolol (TENORMIN) 50 MG tablet Take 50 mg by mouth 2 (two) times daily.     Continuous Blood Gluc Receiver (FREESTYLE LIBRE 2 READER) DEVI      Continuous Blood Gluc Sensor (FREESTYLE  LIBRE 2 SENSOR) MISC      diclofenac (FLECTOR) 1.3 % PTCH Place 1 patch onto the skin daily as needed.      diclofenac Sodium (VOLTAREN) 1 % GEL Apply 2 g topically 4 (four) times daily.     eplerenone (INSPRA) 50 MG tablet Take 50 mg by mouth daily.     Evolocumab (REPATHA SURECLICK) 140 MG/ML SOAJ Inject 140 mg into the skin every 14 (fourteen) days. 2 mL 11   ketoconazole (NIZORAL) 2 % shampoo Apply topically daily.     lidocaine (LIDODERM) 5 % APPLY 1 PATCH TO INTACT SKIN REMOVE AFTER 12 HOURS ONCE A DAY TO NECK AS NEEDED     LORazepam (ATIVAN) 1 MG tablet Take 1 mg by mouth 3 (three) times daily as needed.     meclizine (ANTIVERT) 25 MG tablet Take 25 mg by mouth 2 (two) times daily as needed for dizziness.     olmesartan (BENICAR) 40 MG tablet Take 40 mg by mouth daily.     SYNTHROID 50 MCG tablet Take 100 mcg by mouth daily before breakfast.      No current facility-administered medications on file prior to visit.   Allergies  Allergen Reactions   Amiloride Shortness Of Breath   Blue Dyes (Parenteral) Shortness Of Breath, Rash and Other (See Comments)    Joint pain   Inderal  La [Propranolol Hcl] Shortness Of Breath   Sulfa Antibiotics Anaphylaxis   Ace Inhibitors Nausea And Vomiting   Norvasc [Amlodipine] Nausea And Vomiting   Calcium Channel Blockers Diarrhea   Catapres [Clonidine Hcl]     Decreased mental clarity    Clonidine Derivatives Other (See Comments)    Headache, flu like symptoms   Diovan [Valsartan] Cough   Hydralazine Other (See Comments)    Headache   Metformin And Related Diarrhea   Micardis [Telmisartan] Other (See Comments)    Hallucinations, increase in BP   Minoxidil Other (See Comments)    Fast heart rate, chest pain   Plavix [Clopidogrel]     Increased LFT's    Repatha [Evolocumab]    Serotonin Reuptake Inhibitors (Ssris) Other (See Comments)    Headache   Topamax [Topiramate] Other (See Comments)    Confusion   Tramadol     Nausea as a dog per  pt   Tylenol With Codeine #3 [Acetaminophen-Codeine] Other (See Comments)    Lightheaded   Zetia [Ezetimibe]     Liver  Function increase labs   Amoxicillin Diarrhea   Chlorthalidone     Dizziness.    Doxycycline Diarrhea   Fosamax [Alendronate Sodium]     Pt's teeth fell out while taking.    Lexapro [Escitalopram]     Brain Fog    Spironolactone Palpitations   Statins     Upset stomach     No results found for this or any previous visit (from the past 2160 hour(s)).  Objective: General: Patient is awake, alert and in no acute distress.  Using rolling walker.  Integument: Skin is warm, dry and supple bilateral. Nails are tender, long, thickened and dystrophic with subungual debris, consistent with onychomycosis, 1-5 bilateral. No signs of infection. No open lesions or preulcerative lesions present bilateral. Small pinch callus at distal tuft of left 4th toe and minimal reactive keratosis plantar left forefoot with no signs of infection.  Remaining integument unremarkable.  Vasculature:  Dorsalis Pedis pulse 1/4 bilateral. Posterior Tibial pulse  1/4 bilateral. Capillary fill time <3 sec 1-5 bilateral. Positive hair growth to the level of the digits.Temperature gradient within normal limits. No varicosities present bilateral. No edema present bilateral.   Neurology: The patient has intact sensation measured with a 5.07/10g Semmes Weinstein Monofilament at all pedal sites bilateral . Vibratory sensation diminished bilateral with tuning fork. No Babinski sign present bilateral.   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  Strength acceptable for patient status.  No tenderness with calf compression bilateral.  Assessment and Plan: Problem List Items Addressed This Visit       Endocrine   Type 2 diabetes, controlled, with neuropathy (HCC)   Other Visit Diagnoses     Pain due to onychomycosis of toenail    -  Primary   Anticoagulant  long-term use       History of stroke          -Examined patient. -Re-Discussed and educated patient on diabetic foot care, especially with regards to the vascular, neurological and musculoskeletal systems.  -Mechanically debrided all nails 1-5 bilateral using sterile nail nipper and filed with dremel without incident   -Smoothed callus using bur at tip of left 4th toe and ball of left foot without incident  -Advised patient to start using foot cream or moisturizers for dry skin and to avoid walking barefoot -Patient to return in 2.5 to 3 months for at risk foot care -Patient  advised to call the office if any problems or questions arise in the meantime.  Landis Martins, DPM

## 2020-12-17 ENCOUNTER — Ambulatory Visit (INDEPENDENT_AMBULATORY_CARE_PROVIDER_SITE_OTHER): Payer: Medicare Other | Admitting: Neurology

## 2020-12-17 ENCOUNTER — Other Ambulatory Visit: Payer: Self-pay

## 2020-12-17 ENCOUNTER — Encounter: Payer: Self-pay | Admitting: Neurology

## 2020-12-17 VITALS — BP 162/68 | HR 66 | Ht 61.0 in | Wt 113.0 lb

## 2020-12-17 DIAGNOSIS — R51 Headache with orthostatic component, not elsewhere classified: Secondary | ICD-10-CM | POA: Diagnosis not present

## 2020-12-17 DIAGNOSIS — G43009 Migraine without aura, not intractable, without status migrainosus: Secondary | ICD-10-CM | POA: Diagnosis not present

## 2020-12-17 DIAGNOSIS — I701 Atherosclerosis of renal artery: Secondary | ICD-10-CM | POA: Diagnosis not present

## 2020-12-17 DIAGNOSIS — R519 Headache, unspecified: Secondary | ICD-10-CM

## 2020-12-17 MED ORDER — UBRELVY 100 MG PO TABS: 100.0000 mg | ORAL_TABLET | ORAL | 0 refills | Status: DC | PRN

## 2020-12-17 NOTE — Progress Notes (Addendum)
[facility]    Provider:  Dr Jaynee Eagles Requesting Provider: Ernestene Kiel, MD Primary Care Provider:  Ernestene Kiel, MD  CC:  headaches  HPI:  Brandy Flowers is a 80 y.o. female here as requested by Ernestene Kiel, MD for headaches. Pmhx diabetes, headaches, COPD, statin myopathy, thyroid disease, stroke, migraines, hypothyroidism, hyperlipidemia, arthritis, anxiety, sinus problems, renal artery stenosis, hyponatremia, labile blood pressure, pontine cerebrovascular accident, oropharyngeal dysphagia, hypertension.  I reviewed Dr. Noe Gens notes: Patient sees a chiropractor, she has neck problems with dizziness and headaches.  I reviewed Epic notes, Patient was seen by Dr. Jannifer Franklin here in the office in December 2020 after stroke in January 03, 2018, she presented with generalized weakness and slurred speech that was acute in onset, at the time she was walking with a cane, she returned to the hospital on December 26, 2018 with vertigo that was positional in nature, MRI of the brain done at that time showed no acute changes, she started having headaches sometime in late summer 2020, but at the time she saw Dr. Jannifer Franklin on February 08, 2019 she states that the headaches had resolved the last 2 months after stopping a medication with a diet and at that she thinks she was allergic to or sensitive to.  She also complained of some low back pain and some occasional right leg pain that comes and goes, history of restless leg syndrome, she has not been able to tolerate statins in the past.  Headaches are In the temples and feels like her whole head is pressure, pulsating, nausea, not much light sensitivity. Codeine helps but makes her dizzy. Headaches started after the olmesartan and inspra. Not pulsating/pounding. Not photophobia or phonophobia, some mild nausea. Dramamine helps with the nausea but not dizziness. Sometimes unilateral, she is having headaches daily. Sometimes she wakes  up with them. She is not excessively fatigued during the day, may feel a little weak. When she gets headaches she check blood pressure. Stopped taking the butalbital. She is taking a codeine. Laying down helps. Dizziness is from the codeine, no vision changes. Ongoing over a year, worsening, more frequent, some days they can be severe but other days mild, usually doesn't wake up in the morning with them but develops during the day, between 4-14 total headache days a month   Reviewed notes, labs and imaging from outside physicians, which showed:  Recent blood work(from referrng provider notes) includes normal TSH and T4, LDL 64, CBC normal, CMP with elevated glucose 128 otherwise normal, hemoglobin A1c 6.3.  From a thorough review of records, medications tried that can be used in migraine management include aspirin, meclizine, Fioricet, atenolol, gabapentin, Tylenol, amlodipine, aspirin, atenolol, Lexapro, losartan, meclizine, Mobic, Zofran, prednisone, Compazine injections, scopolamine patches, tizanidine, topiramate, amitriptyline  MRI brain 12/2018: FINDINGS: personally reviewed images and agree Brain: Negative for acute infarct. 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 No significant white matter ischemia. Negative for hemorrhage or mass. Mild atrophy without hydrocephalus.   Vascular: Normal arterial flow voids   Skull and upper cervical spine: Negative   Sinuses/Orbits: Mild mucosal edema paranasal sinuses. Bilateral cataract surgery   Other: None   IMPRESSION: No acute abnormality   Atrophy and mild chronic ischemic change including the pons.  Review of Systems: Patient complains of symptoms per HPI as well as the following symptoms headache. Pertinent negatives and positives per HPI. All others negative.   Social History   Socioeconomic History   Marital status: Widowed    Spouse name: Not on  file   Number of children: Not on file   Years of education: Not on file   Highest  education level: Not on file  Occupational History   Not on file  Tobacco Use   Smoking status: Never   Smokeless tobacco: Never  Vaping Use   Vaping Use: Never used  Substance and Sexual Activity   Alcohol use: No   Drug use: No   Sexual activity: Not Currently  Other Topics Concern   Not on file  Social History Narrative   Not on file   Social Determinants of Health   Financial Resource Strain: Not on file  Food Insecurity: Not on file  Transportation Needs: Not on file  Physical Activity: Not on file  Stress: Not on file  Social Connections: Not on file  Intimate Partner Violence: Not on file    Family History  Problem Relation Age of Onset   Stroke Mother    Diabetes Mother    Heart disease Mother        Before age 8   Hypertension Mother    Hyperlipidemia Mother    Heart disease Father    Heart attack Father    Hypertension Father    Heart disease Brother        After age 11- CABG   Hypertension Brother    Migraines Neg Hx     Past Medical History:  Diagnosis Date   Anxiety    Arthritis    COPD with exacerbation ([place])    Diabetes mellitus without complication (HCC)    Diastolic dysfunction    Grade 1   Hyperlipidemia    Hypertension    Hypothyroidism    Migraines    Renal artery stenosis (HCC)    Left 50%   Sigmoid diverticulosis    Sinus problem    Splenic artery aneurysm (HCC)    8m   Stroke ([facility]    Thyroid disease     Patient Active Problem List   Diagnosis Date Noted   COPD with exacerbation ([facility]    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   Statin not tolerated 10/08/2019   Thyroid disease    [facility]    Sinus problem    Sigmoid diverticulosis    Migraines    Hypothyroidism    Hyperlipidemia    Diabetes mellitus without complication (HCC)    Arthritis    Anxiety    Renal artery stenosis (HCC)    Hyponatremia    Labile blood pressure    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 ([facility] 12/20/2017   Oropharyngeal dysphagia     Diastolic dysfunction    Dysphagia, post-stroke    Hypokalemia    [place]cerebrum) [place] 12/17/[place]   Anxiety state 12/17/2017   [place]   Left renal artery stenosis ([facility] 12/05/2014   Type 2 diabetes, controlled, with neuropathy ([facility] 09/20/2013   Hypertension 09/20/2013   Hypothyroid 09/20/2013    Past Surgical History:  Procedure Laterality Date   BREAST REDUCTION SURGERY  2007   CATARACT EXTRACTION Bilateral    CHOLECYSTECTOMY      Current Outpatient Medications  Medication Sig Dispense Refill   ACETAMINOPHEN-BUTALBITAL 50-325 MG TABS Take 1 tablet by mouth 3 (three) times daily as needed.     Acetaminophen-Codeine 300-30 MG tablet Take by mouth.     aspirin 325 MG tablet Take 1 tablet (325 mg total) by mouth daily. (Patient taking differently: Take 325 mg by mouth daily. Takes 2 daily PRN)  atenolol (TENORMIN) 50 MG tablet Take 50 mg by mouth 2 (two) times daily.     Continuous Blood Gluc Receiver (FREESTYLE LIBRE 2 READER) DEVI      Continuous Blood Gluc Sensor (FREESTYLE LIBRE 2 SENSOR) MISC      diclofenac (FLECTOR) 1.3 % PTCH Place 1 patch onto the skin daily as needed.      eplerenone (INSPRA) 50 MG tablet Take 50 mg by mouth daily.     Evolocumab (REPATHA SURECLICK) 354 MG/ML SOAJ Inject 140 mg into the skin every 14 (fourteen) days. 2 mL 11   lidocaine (LIDODERM) 5 % APPLY 1 PATCH TO INTACT SKIN REMOVE AFTER 12 HOURS ONCE A DAY TO NECK AS NEEDED     LORazepam (ATIVAN) 1 MG tablet Take 1 mg by mouth 3 (three) times daily as needed.     meclizine (ANTIVERT) 25 MG tablet Take 25 mg by mouth 2 (two) times daily as needed for dizziness.     olmesartan (BENICAR) 40 MG tablet Take 40 mg by mouth daily.     SYNTHROID 50 MCG tablet Take 100 mcg by mouth daily before breakfast.      Ubrogepant (UBRELVY) 100 MG TABS Take 100 mg by mouth every 2 (two) hours as needed. Maximum 245m a day. 16 tablet 0   No current facility-administered  medications for this visit.    Allergies as of 12/17/2020 - Review Complete 12/17/2020  Allergen Reaction Noted   Amiloride Shortness Of Breath 02/01/2019   Blue dyes (parenteral) Shortness Of Breath, Rash, and Other (See Comments) 02/22/2013   Inderal la [propranolol hcl] Shortness Of Breath 10/02/2019   Sulfa antibiotics Anaphylaxis 02/22/2013   Ace inhibitors Nausea And Vomiting 12/05/2014   Norvasc [amlodipine] Nausea And Vomiting 12/05/2014   Calcium channel blockers Diarrhea 12/17/2017   Catapres [clonidine hcl]  02/01/2019   Clonidine derivatives Other (See Comments) 10/02/2019   Diovan [valsartan] Cough 10/02/2019   Hydralazine Other (See Comments) 10/02/2019   Metformin and related Diarrhea 10/02/2019   Micardis [telmisartan] Other (See Comments) 10/02/2019   Minoxidil Other (See Comments) 10/02/2019   Plavix [clopidogrel]  02/01/2019   Serotonin reuptake inhibitors (ssris) Other (See Comments) 10/02/2019   Topamax [topiramate] Other (See Comments) 10/02/2019   Tramadol  02/05/2019   Tylenol with codeine #3 [acetaminophen-codeine] Other (See Comments) 10/02/2019   Zetia [ezetimibe]  02/05/2019   Amoxicillin Diarrhea 02/01/2019   Chlorthalidone  02/01/2019   Doxycycline Diarrhea 02/01/2019   Fosamax [alendronate sodium]  02/01/2019   Lexapro [escitalopram]  02/01/2019   Spironolactone Palpitations 02/01/2019   Statins  02/01/2019    Vitals: BP (!) 162/68   Pulse 66   Ht 5' 1"  (1.549 m)   Wt 113 lb (51.3 kg)   BMI 21.35 kg/m  Last Weight:  Wt Readings from Last 1 Encounters:  12/17/20 113 lb (51.3 kg)   Last Height:   Ht Readings from Last 1 Encounters:  12/17/20 5' 1"  (1.549 m)     Physical exam: Exam: Gen: NAD, conversant, well nourised, well groomed                     CV: RRR, no MRG. No Carotid Bruits. No peripheral edema, warm, nontender Eyes: Conjunctivae clear without exudates or hemorrhage  Neuro: Detailed Neurologic Exam  Speech:    Speech  is normal; fluent and spontaneous with normal comprehension.  Cognition:    The patient is oriented to person, place, and time;     recent and remote  memory intact;     language fluent;     normal attention, concentration,     fund of knowledge Cranial Nerves:    The pupils are equal, round, and reactive to light. The fundi are normal and spontaneous venous pulsations are present. Visual fields are full to finger confrontation. Extraocular movements are intact. Trigeminal sensation is intact and the muscles of mastication are normal. The face is symmetric. The palate elevates in the midline. Hearing intact. Voice is normal. Shoulder shrug is normal. The tongue has normal motion without fasciculations.   Coordination:    Normal finger to nose and heel to shin. Normal rapid alternating movements.   Gait:   Slow, cautious, narrow-based, stumbled, with a cane.   Motor Observation:    No asymmetry, no atrophy, and no involuntary movements noted. Tone:    Normal muscle tone.    Posture:    Slightly stooped    Strength:    Strength is equal and antigravity, no focal deficits, some generalized mild weakness     Sensation: intact to LT     Reflex Exam:  DTR's:    Deep tendon reflexes in the upper and lower extremities are brisk  bilaterally.   Toes:    The toes are downgoing bilaterally.   Clonus:    Clonus is absent.    Assessment/Plan:  80 y.o. female here as requested by Ernestene Kiel, MD for headaches. Pmhx diabetes, headaches, COPD, statin myopathy, thyroid disease, stroke, migraines, hypothyroidism, hyperlipidemia, arthritis, anxiety, sinus problems, renal artery stenosis, hyponatremia, labile blood pressure, pontine cerebrovascular accident, oropharyngeal dysphagia, hypertension  - between 4-14 total headache(migraine + headache) days a month, can qualift for ubrelvy, 4 may be slightly migrainous. Triptans contraindicated.  - patient states the headaches started after  she as changed from [place] to Olmesartan and started inspra. I recommended she stop inspra first to see if the headaches resolve but would need approval from Dr. Laqueta Due first. If that does not help headaches we could switch back to [facility] temporarily to see if it is a medication effect but again need to make sure ok with Dr. Laqueta Due. If stopping these meds do not improve headache I would discuss starting migraine/headache prevention. Also recommended MRI she declines, she will however agree to a CT scan. - Triptans contraindicate, gave samples of Ubrelvy she can send me a mychart to tell me if it helps. - She declines an MRI, she can have a CT [place] - Tried calling Dr. Felipa Emory office, closed, my cell is [phone] and I wil try again - esr/crp   Orders Placed This Encounter  Procedures   CT HEAD W & WO CONTRAST (5MM)   Sedimentation rate   C-reactive protein   Basic Metabolic Panel    Meds ordered this encounter  Medications   Ubrogepant (UBRELVY) 100 MG TABS    Sig: Take 100 mg by mouth every 2 (two) hours as needed. Maximum 237m a day.    Dispense:  16 tablet    Refill:  0   Discussed: To prevent or relieve headaches, try the following: Cool Compress. Lie down and place a cool compress on your head.  Avoid headache triggers. If certain foods or odors seem to have triggered your migraines in the past, avoid them. A headache diary might help you identify triggers.  Include physical activity in your daily routine. Try a daily walk or other moderate aerobic exercise.  Manage stress. Find healthy ways to cope with the stressors, such as delegating  tasks on your to-do list.  Practice relaxation techniques. Try deep breathing, yoga, massage and visualization.  Eat regularly. Eating regularly scheduled meals and maintaining a healthy diet might help prevent headaches. Also, drink plenty of fluids.  Follow a regular sleep schedule. Sleep deprivation might contribute to  headaches Consider biofeedback. With this mind-body technique, you learn to control certain bodily functions -- such as muscle tension, heart rate and blood pressure -- to prevent headaches or reduce headache pain.    Proceed to emergency room if you experience new or worsening symptoms or symptoms do not resolve, if you have new neurologic symptoms or if headache is severe, or for any concerning symptom.   Provided education and documentation from American headache Society toolbox including articles on: chronic migraine medication overuse headache, chronic migraines, prevention of migraines, behavioral and other nonpharmacologic treatments for headache.    Cc: Ernestene Kiel, MD,  Ernestene Kiel, MD  Sarina Ill, MD  [facility] [address] [place], [place] [address]  Phone [phone] Fax [phone]

## 2020-12-17 NOTE — Patient Instructions (Addendum)
CT of the head will call Blood work today Bernita Raisin: Please take one tablet at the onset of your headache. If it does not improve the symptoms please take one additional tablet. Do not take more then 2 tablets in 24hrs.   Discuss with Dr. [name]:  Stop olmesartan and switch back to losaartan 1 week and see if the headaches go away 2. If the headaches do not resolve then can try to stop inspra for aweek  Pending above we will follow up together and discuss plan  General Headache Without Cause A headache is pain or discomfort that is felt around the head or neck area. There are many causes and types of headaches. In some cases, the cause may not be found. Follow these instructions at home: Watch your condition for any changes. Let your doctor know about them. Take these steps to help with your condition: Managing pain   Take over-the-counter and prescription medicines only as told by your doctor. Lie down in a dark, quiet room when you have a headache. If told, put ice on your head and neck area: Put ice in a plastic bag. Place a towel between your skin and the bag. Leave the ice on for 20 minutes, 2-3 times per day. If told, put heat on the affected area. Use the heat source that your doctor recommends, such as a moist heat pack or a heating pad. Place a towel between your skin and the heat source. Leave the heat on for 20-30 minutes. Remove the heat if your skin turns bright red. This is very important if you are unable to feel pain, heat, or cold. You may have a greater risk of getting burned. Keep lights dim if bright lights bother you or make your headaches worse. Eating and drinking Eat meals on a regular schedule. If you drink alcohol: Limit how much you use to: 0-1 drink a day for women. 0-2 drinks a day for men. Be aware of how much alcohol is in your drink. In the [place] one drink equals one 12 oz bottle of beer (355 mL), one 5 oz glass of wine (148 mL), or one 1 oz glass of  hard liquor (44 mL). Stop drinking caffeine, or reduce how much caffeine you drink. General instructions  Keep a journal to find out if certain things bring on headaches. For example, write down: What you eat and drink. How much sleep you get. Any change to your diet or medicines. Get a massage or try other ways to relax. Limit stress. Sit up straight. Do not tighten (tense) your muscles. Do not use any products that contain nicotine or tobacco. This includes cigarettes, e-cigarettes, and chewing tobacco. If you need help quitting, ask your doctor. Exercise regularly as told by your doctor. Get enough sleep. This often means 7-9 hours of sleep each night. Keep all follow-up visits as told by your doctor. This is important. Contact a doctor if: Your symptoms are not helped by medicine. You have a headache that feels different than the other headaches. You feel sick to your stomach (nauseous) or you throw up (vomit). You have a fever. Get help right away if: Your headache gets very bad quickly. Your headache gets worse after a lot of physical activity. You keep throwing up. You have a stiff neck. You have trouble seeing. You have trouble speaking. You have pain in the eye or ear. Your muscles are weak or you lose muscle control. You lose your balance or have trouble walking.  You feel like you will pass out (faint) or you pass out. You are mixed up (confused). You have a seizure. Summary A headache is pain or discomfort that is felt around the head or neck area. There are many causes and types of headaches. In some cases, the cause may not be found. Keep a journal to help find out what causes your headaches. Watch your condition for any changes. Let your doctor know about them. Contact a doctor if you have a headache that is different from usual, or if your headache is not helped by medicine. Get help right away if your headache gets very bad, you throw up, you have trouble seeing,  you lose your balance, or you have a seizure. This information is not intended to replace advice given to you by your health care provider. Make sure you discuss any questions you have with your health care provider. Document Revised: 08/22/2017 Document Reviewed: 08/22/2017 Elsevier Patient Education  2022 [employer].

## 2020-12-18 ENCOUNTER — Telehealth: Payer: Self-pay | Admitting: Neurology

## 2020-12-18 LAB — BASIC METABOLIC PANEL
BUN/Creatinine Ratio: 38 — ABNORMAL HIGH (ref 12–28)
BUN: 27 mg/dL (ref 8–27)
CO2: 25 mmol/L (ref 20–29)
Calcium: 10.5 mg/dL — ABNORMAL HIGH (ref 8.7–10.3)
Chloride: 99 mmol/L (ref 96–106)
Creatinine, Ser: 0.71 mg/dL (ref 0.57–1.00)
Glucose: 144 mg/dL — ABNORMAL HIGH (ref 70–99)
Potassium: 4.3 mmol/L (ref 3.5–5.2)
Sodium: 139 mmol/L (ref 134–144)
eGFR: 86 mL/min/{1.73_m2} (ref >59)

## 2020-12-18 LAB — SEDIMENTATION RATE: Sed Rate: 6 mm/hr (ref 0–40)

## 2020-12-18 LAB — C-REACTIVE PROTEIN: CRP: 2 mg/L (ref 0–10)

## 2020-12-18 NOTE — Telephone Encounter (Signed)
Medicare/[employer] fed no auth require faxed to [facility] they will reach out to the patient to schedule

## 2020-12-22 NOTE — Telephone Encounter (Signed)
Pt sending multiple messages. Dr Lucia Gaskins has responded to pt in another message.

## 2021-01-01 ENCOUNTER — Encounter: Payer: Self-pay | Admitting: Neurology

## 2021-01-02 NOTE — Telephone Encounter (Signed)
Pt given the results of MRI, on another email. I faxed copy of MRI report to pcp, Dr. [name] this am.

## 2021-01-03 ENCOUNTER — Telehealth: Payer: Self-pay | Admitting: Neurology

## 2021-01-03 NOTE — Telephone Encounter (Signed)
Would you please call Dr. Donita Brooks office and leave a message for Dr. [name] to call me on my cell or text me regarding this patient. My cell is [phone] Dr. [name] can text me or call me. I'd like to discus changing Patient's  olmesartan to another medication, when patient stopped the olmesartan her headaches resolved but she had to restart it due to elevated BP. I don't want to change patient's hypertensive medication plan without discussing with primary care who has established the plan. I will also forward this telephone note to her office and see if I can get a message to Dr. [name]. Thanks    Email to patient: Brandy Flowers, if we still think it is the olmesartan causing the headaches then Dr. [name] needs to put you on different medication for your blood pressure and stop the olmesartan, that is the only way we will know for sure. There are many blood pressure medications, you don't have to live with one that gives you headaches thankfully. I have contacted her office and hope to hear from Dr. [name] to discuss. Do you have email access to her like you do with me? If so, can you email her and let her know that when you stopped the olmesartan the headaches went away but you had to restart it due to your blood pressure increasing? You can ask her directly to change that medication to a different blood presure pill. I will keep trying to reach Dr. [name] as well thanks

## 2021-01-05 ENCOUNTER — Encounter: Payer: Self-pay | Admitting: Neurology

## 2021-01-05 NOTE — Telephone Encounter (Signed)
LMVM for Dr. [name] to return call about pt, left Dr Trevor Mace mobile to contact about pt via phone or text.

## 2021-01-06 ENCOUNTER — Other Ambulatory Visit: Payer: Self-pay | Admitting: Neurology

## 2021-01-06 DIAGNOSIS — G43709 Chronic migraine without aura, not intractable, without status migrainosus: Secondary | ICD-10-CM

## 2021-01-06 MED ORDER — EMGALITY 120 MG/ML ~~LOC~~ SOAJ: 120.0000 mg | SUBCUTANEOUS | 11 refills | Status: DC

## 2021-01-06 NOTE — Telephone Encounter (Signed)
I called and LMVM for pt that can come in for injection (emgality) for loading dose of 240mg , then will be 120mg  every 30 days after that.

## 2021-01-09 ENCOUNTER — Encounter: Payer: Self-pay | Admitting: Neurology

## 2021-01-10 ENCOUNTER — Encounter: Payer: Self-pay | Admitting: Sports Medicine

## 2021-01-12 ENCOUNTER — Other Ambulatory Visit: Payer: Self-pay | Admitting: Neurology

## 2021-01-12 DIAGNOSIS — G43009 Migraine without aura, not intractable, without status migrainosus: Secondary | ICD-10-CM

## 2021-01-12 HISTORY — DX: Migraine without aura, not intractable, without status migrainosus: G43.009

## 2021-01-12 MED ORDER — UBRELVY 100 MG PO TABS: 100.0000 mg | ORAL_TABLET | ORAL | 2 refills | Status: DC | PRN

## 2021-01-12 NOTE — Telephone Encounter (Signed)
Ubrelvy 100 mg sample order reordered and sent electronically to [facility] in [place] as a prescription.

## 2021-01-13 ENCOUNTER — Telehealth: Payer: Self-pay | Admitting: Neurology

## 2021-01-13 NOTE — Telephone Encounter (Signed)
Pt called, computer is down, not able to send messages. Want to make sure form has been filled out. (Concerning last MyChart message.)

## 2021-01-14 ENCOUNTER — Telehealth: Payer: Self-pay | Admitting: Neurology

## 2021-01-14 NOTE — Telephone Encounter (Signed)
Pt called states she is having another headache she already took one of the Ubrogepant (UBRELVY) 100 MG TABS and it did not help, asking if she can take another. Pt requesting a call back.

## 2021-01-14 NOTE — Telephone Encounter (Signed)
I called [employer] spoke to [place] after trying to do PA on CMM.  She said that need to have faxed form vs electronically.  I received completed and signed by Dr Lucia Gaskins (fax confirmation received (ovf note and med list included).  [phone].

## 2021-01-14 NOTE — Telephone Encounter (Signed)
I called pt and she would like to use ubrelvy and is asking if that would be ok, to take additional tablet, I relayed can take 2 hours after first tablet if needed.  She also needed non formulary exception for this with her insurance.  I will work on this for her.  Emgality she is not wanting this right now.

## 2021-01-15 MED ORDER — NURTEC 75 MG PO TBDP: ORAL_TABLET | ORAL | 5 refills | Status: DC

## 2021-01-15 NOTE — Addendum Note (Signed)
Addended by: Guy Begin on: 01/15/2021 12:32 PM   Modules accepted: Orders

## 2021-01-15 NOTE — Addendum Note (Signed)
Addended by: Guy Begin on: 01/15/2021 01:52 PM   Modules accepted: Orders

## 2021-01-15 NOTE — Telephone Encounter (Signed)
Received denial for Brandy Flowers, from [facility] FEP (she had not experienced a treatment failure, intolerance to, adverse reaction to or a contraindication to one or more of the covered options in the therapeutic class.  I called [employer] FEP and she stated that pt had not tried NURTEC.  This was ok'd to try by Dr. Lucia Gaskins.

## 2021-01-17 ENCOUNTER — Encounter: Payer: Self-pay | Admitting: Neurology

## 2021-01-20 ENCOUNTER — Ambulatory Visit (INDEPENDENT_AMBULATORY_CARE_PROVIDER_SITE_OTHER): Payer: Medicare Other | Admitting: Sports Medicine

## 2021-01-20 ENCOUNTER — Encounter: Payer: Self-pay | Admitting: Sports Medicine

## 2021-01-20 DIAGNOSIS — Z7901 Long term (current) use of anticoagulants: Secondary | ICD-10-CM

## 2021-01-20 DIAGNOSIS — M79676 Pain in unspecified toe(s): Secondary | ICD-10-CM | POA: Diagnosis not present

## 2021-01-20 DIAGNOSIS — E1140 Type 2 diabetes mellitus with diabetic neuropathy, unspecified: Secondary | ICD-10-CM

## 2021-01-20 DIAGNOSIS — B351 Tinea unguium: Secondary | ICD-10-CM | POA: Diagnosis not present

## 2021-01-20 DIAGNOSIS — Z8673 Personal history of transient ischemic attack (TIA), and cerebral infarction without residual deficits: Secondary | ICD-10-CM

## 2021-01-20 NOTE — Progress Notes (Signed)
Subjective: Brandy Flowers is a 80 y.o. female patient with history of diabetes, not on any medications, who presents to office today complaining of long, painful nails and callus while ambulating in shoes; unable to trim. Patient states that the glucose reading this morning was not recorded and last visit to PCP was several months ago states that she has been having issues with her headaches and is seeing a neurologist.  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     Acetaminophen-Codeine 300-30 MG tablet Take by mouth.     aspirin 325 MG tablet Take 1 tablet (325 mg total) by mouth daily. (Patient taking differently: Take 325 mg by mouth daily. Takes 2 daily PRN)     atenolol (TENORMIN) 50 MG tablet Take 50 mg by mouth 2 (two) times daily.     Continuous Blood Gluc Receiver (FREESTYLE LIBRE 2 READER) DEVI      Continuous Blood Gluc  Sensor (FREESTYLE LIBRE 2 SENSOR) MISC      diclofenac (FLECTOR) 1.3 % PTCH Place 1 patch onto the skin daily as needed.      eplerenone (INSPRA) 50 MG tablet Take 50 mg by mouth daily.     Evolocumab (REPATHA SURECLICK) 440 MG/ML SOAJ Inject 140 mg into the skin every 14 (fourteen) days. 2 mL 11   Galcanezumab-gnlm (EMGALITY) 120 MG/ML SOAJ Inject 120 mg into the skin every 30 (thirty) days. 1.12 mL 11   lidocaine (LIDODERM) 5 % APPLY 1 PATCH TO INTACT SKIN REMOVE AFTER 12 HOURS ONCE A DAY TO NECK AS NEEDED     LORazepam (ATIVAN) 1 MG tablet Take 1 mg by mouth 3 (three) times daily as needed.     meclizine (ANTIVERT) 25 MG tablet Take 25 mg by mouth 2 (two) times daily as needed for dizziness.     olmesartan (BENICAR) 40 MG tablet Take 40 mg by mouth daily.     Rimegepant Sulfate (NURTEC) 75 MG TBDP Take one tablet daily onset migraine. (Max one tablet daily). 10 tablet 5   SYNTHROID 50 MCG tablet Take 100 mcg by mouth daily before breakfast.      No current facility-administered medications on file prior to visit.   Allergies  Allergen Reactions   Amiloride Shortness Of Breath   Blue Dyes (Parenteral) Shortness Of Breath, Rash and Other (See Comments)  Joint pain   Inderal La [Propranolol Hcl] Shortness Of Breath   Sulfa Antibiotics Anaphylaxis   Ace Inhibitors Nausea And Vomiting   Norvasc [Amlodipine] Nausea And Vomiting   Calcium Channel Blockers Diarrhea   Catapres [Clonidine Hcl]     Decreased mental clarity    Clonidine Derivatives Other (See Comments)    Headache, flu like symptoms   Diovan [Valsartan] Cough   Hydralazine Other (See Comments)    Headache   Metformin And Related Diarrhea   Micardis [Telmisartan] Other (See Comments)    Hallucinations, increase in BP   Minoxidil Other (See Comments)    Fast heart rate, chest pain   Plavix [Clopidogrel]     Increased LFT's    Serotonin Reuptake Inhibitors (Ssris) Other (See Comments)    Headache   Topamax [Topiramate]  Other (See Comments)    Confusion   Tramadol     Nausea as a dog per pt   Tylenol With Codeine #3 [Acetaminophen-Codeine] Other (See Comments)    Lightheaded   Zetia [Ezetimibe]     Liver  Function increase labs   Amoxicillin Diarrhea   Chlorthalidone     Dizziness.    Doxycycline Diarrhea   Fosamax [Alendronate Sodium]     Pt's teeth fell out while taking.    Lexapro [Escitalopram]     Brain Fog    Spironolactone Palpitations   Statins     Upset stomach     Recent Results (from the past 2160 hour(s))  Sedimentation rate     Status: None   Collection Time: 12/17/20 12:31 PM  Result Value Ref Range   Sed Rate 6 0 - 40 mm/hr  C-reactive protein     Status: None   Collection Time: 12/17/20 12:31 PM  Result Value Ref Range   CRP 2 0 - 10 mg/L  Basic Metabolic Panel     Status: Abnormal   Collection Time: 12/17/20 12:31 PM  Result Value Ref Range   Glucose 144 (H) 70 - 99 mg/dL   BUN 27 8 - 27 mg/dL   Creatinine, Ser 0.71 0.57 - 1.00 mg/dL   eGFR 86 >59 mL/min/1.73   BUN/Creatinine Ratio 38 (H) 12 - 28   Sodium 139 134 - 144 mmol/L   Potassium 4.3 3.5 - 5.2 mmol/L   Chloride 99 96 - 106 mmol/L   CO2 25 20 - 29 mmol/L   Calcium 10.5 (H) 8.7 - 10.3 mg/dL    Objective: General: Patient is awake, alert and in no acute distress.  Using rolling walker.  Integument: Skin is warm, dry and supple bilateral. Nails are tender, long, thickened and dystrophic with subungual debris, consistent with onychomycosis, 1-5 bilateral. No signs of infection. No open lesions or preulcerative lesions present bilateral. Small pinch callus at distal tuft of left 4th toe and minimal reactive keratosis plantar left forefoot with no signs of infection.  Remaining integument unremarkable.  Vasculature:  Dorsalis Pedis pulse 1/4 bilateral. Posterior Tibial pulse  1/4 bilateral. Capillary fill time <3 sec 1-5 bilateral. Positive hair growth to the level of the digits.Temperature gradient within normal  limits. No varicosities present bilateral. No edema present bilateral.   Neurology: Gross sensation present via light touch bilateral.  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  Strength acceptable for patient status.  No tenderness with calf compression bilateral.  Assessment and Plan: Problem List Items Addressed This Visit       Endocrine   Type 2 diabetes, controlled, with  neuropathy ([place])   Other Visit Diagnoses     Pain due to onychomycosis of toenail    -  Primary   Anticoagulant long-term use       History of stroke          -Examined patient. -Re-Discussed and educated patient on diabetic foot care, especially with regards to the vascular, neurological and musculoskeletal systems.  -Mechanically debrided all nails 1-5 bilateral using sterile nail nipper and filed with dremel without incident   -Smoothed callus using bur at tip of left 4th toe and ball of left foot without incident  -Advised patient to start using foot cream or moisturizers for dry skin and applied foot miracle to her feet at today's visit -Patient to return in 2.5 to 3 months for at risk foot care -Patient advised to call the office if any problems or questions arise in the meantime.  Landis Martins, DPM

## 2021-01-20 NOTE — Telephone Encounter (Signed)
CMM PA KEY [id number] for NURTEC PA.  APPROVED 12-21-2020 thru 07-19-2021.  BCBCFEP/ [employer].

## 2021-01-21 ENCOUNTER — Encounter: Payer: Self-pay | Admitting: Neurology

## 2021-01-22 ENCOUNTER — Telehealth: Payer: Self-pay | Admitting: *Deleted

## 2021-01-22 ENCOUNTER — Encounter: Payer: Self-pay | Admitting: Neurology

## 2021-01-22 NOTE — Telephone Encounter (Signed)
I spoke with the patient on the phone.  As she stated, the Nurtec did not seem to help and the Bernita Raisin worked better.  She also understands the Emgality is a once monthly injection (every 30 days) to prevent migraines and that we are waiting to hear back from insurance on whether or not Emgality is approved.  Pt would like for [place] to do an appeal on the Ubrelvy.  I let her know we would be in touch once we hear back from insurance.  She verbalized appreciation and her questions were answered.

## 2021-01-22 NOTE — Telephone Encounter (Signed)
Completed Emgality PA on Cover My Meds. Key: [id number]. Awaiting determination from [employer].

## 2021-01-22 NOTE — Telephone Encounter (Signed)
Spoke with pt on phone. We will try to appeal Ubrelvy.

## 2021-01-25 ENCOUNTER — Encounter: Payer: Self-pay | Admitting: Neurology

## 2021-01-26 ENCOUNTER — Other Ambulatory Visit: Payer: Self-pay | Admitting: *Deleted

## 2021-01-26 NOTE — Telephone Encounter (Signed)
Resubmitted formulary exception for Ubrelvy after trying Nurtec and was not effective.  (Fax confirmation received 01-22-21, received fax from [facility] 01-23-21 completed and faxed back to them this am.

## 2021-01-27 NOTE — Telephone Encounter (Signed)
Pt relayed via email she called [employer] and they did approve the ubrelvy.  I had not received approval notice as yet.  That is wonderful for her.  She will let us know if any other concerns.

## 2021-02-04 ENCOUNTER — Other Ambulatory Visit: Payer: Self-pay | Admitting: *Deleted

## 2021-02-04 MED ORDER — EMGALITY 120 MG/ML ~~LOC~~ SOAJ: SUBCUTANEOUS | 0 refills | Status: DC

## 2021-02-04 NOTE — Telephone Encounter (Signed)
Emgality 120 mg has been approved through 08/02/21. Faxed approval to pharmacy. Received a receipt of confirmation.

## 2021-02-10 ENCOUNTER — Telehealth: Payer: Self-pay | Admitting: Neurology

## 2021-02-10 NOTE — Telephone Encounter (Signed)
[facility]) need clarification, ICD-10 Code  for .Galcanezumab-gnlm (EMGALITY) 120 MG/ML SOAJ  Would like a call from the nurse.

## 2021-02-10 NOTE — Telephone Encounter (Signed)
Received fax.  Completed, signed by Dr. Lucia Gaskins.  FAXED to [facility] at [phone] with confirmation received.   Expedite Please.

## 2021-02-12 ENCOUNTER — Ambulatory Visit (INDEPENDENT_AMBULATORY_CARE_PROVIDER_SITE_OTHER): Payer: Medicare Other | Admitting: Neurology

## 2021-02-12 ENCOUNTER — Encounter: Payer: Self-pay | Admitting: Neurology

## 2021-02-12 ENCOUNTER — Ambulatory Visit: Payer: Medicare Other | Admitting: Neurology

## 2021-02-12 VITALS — BP 160/83 | HR 69 | Ht 61.0 in | Wt 111.0 lb

## 2021-02-12 DIAGNOSIS — G43709 Chronic migraine without aura, not intractable, without status migrainosus: Secondary | ICD-10-CM

## 2021-02-12 MED ORDER — EMGALITY 120 MG/ML ~~LOC~~ SOAJ
240.0000 mg | SUBCUTANEOUS | 0 refills | Status: DC
Start: 2021-02-12 — End: 2021-02-12

## 2021-02-12 NOTE — Progress Notes (Signed)
Patient was unable to inject Emgality loading dose yesterday. Unsure what happened but she said the medication sprayed out of syringe and didn't get into her skin. Today patient was injected with loading dose of Emgality 120 mg x 2 pens. I injected 1 pen into her RLQ of abdomen and she injected the second pen in the same quadrant. The pt tolerated the injections well and her questions were answered. Her friend was also present during the education and verbalized understanding. Order placed for the samples used today.

## 2021-02-12 NOTE — Progress Notes (Signed)
[facility]    Provider:  Dr Lucia Gaskins Requesting Provider: Philemon Kingdom, MD Primary Care Provider:  Philemon Kingdom, MD  CC:  headaches  02/12/2021: Patient perseverates on medications causing her headaches.  I have had contact with Dr. [name] who does not believe that her medications are playing a role.  We tried [name] for her as she just wanted to start something acutely and not preventatively.  She continued to have migrainous headaches.  We got [name] approved. We got emgality approved. She is getting her first injections with training today. She reports unilateral migraines worsening > 3 months, pulsating/pounding/throbbing with nausea and light sensitivity, lasting 4-24 hours, movement makes it worse. She is on Technical sales engineer and ubrelvy as needed.  Just here for a nurse viti to inject.  HPI:  Brandy Flowers is a 80 y.o. female here as requested by Philemon Kingdom, MD for headaches. Pmhx diabetes, headaches, COPD, statin myopathy, thyroid disease, stroke, migraines, hypothyroidism, hyperlipidemia, arthritis, anxiety, sinus problems, renal artery stenosis, hyponatremia, labile blood pressure, pontine cerebrovascular accident, oropharyngeal dysphagia, hypertension.  I reviewed Dr. Geoffery Spruce notes: Patient sees a chiropractor, she has neck problems with dizziness and headaches.  I reviewed Epic notes, Patient was seen by Dr. Anne Hahn here in the office in December 2020 after stroke in January 03, 2018, she presented with generalized weakness and slurred speech that was acute in onset, at the time she was walking with a cane, she returned to the hospital on December 26, 2018 with vertigo that was positional in nature, MRI of the brain done at that time showed no acute changes, she started having headaches sometime in late summer 2020, but at the time she saw Dr. Anne Hahn on February 08, 2019 she states that the headaches had resolved the last 2 months after  stopping a medication with a diet and at that she thinks she was allergic to or sensitive to.  She also complained of some low back pain and some occasional right leg pain that comes and goes, history of restless leg syndrome, she has not been able to tolerate statins in the past.  Headaches are In the temples and feels like her whole head is pressure, pulsating, nausea, not much light sensitivity. Codeine helps but makes her dizzy. Headaches started after the olmesartan and inspra. Not pulsating/pounding. Not photophobia or phonophobia, some mild nausea. Dramamine helps with the nausea but not dizziness. Sometimes unilateral, she is having headaches daily. Sometimes she wakes up with them. She is not excessively fatigued during the day, may feel a little weak. When she gets headaches she check blood pressure. Stopped taking the butalbital. She is taking a codeine. Laying down helps. Dizziness is from the codeine, no vision changes. Ongoing over a year, worsening, more frequent, some days they can be severe but other days mild, usually doesn't wake up in the morning with them but develops during the day, between 4-14 total headache days a month   Reviewed notes, labs and imaging from outside physicians, which showed:  Recent blood work(from referrng provider notes) includes normal TSH and T4, LDL 64, CBC normal, CMP with elevated glucose 128 otherwise normal, hemoglobin A1c 6.3.  From a thorough review of records, medications tried that can be used in migraine management include aspirin, meclizine, Fioricet, atenolol, gabapentin, Tylenol, amlodipine, aspirin, atenolol, Lexapro, losartan, meclizine, Mobic, Zofran, prednisone, Compazine injections, scopolamine patches, tizanidine, topiramate, amitriptyline  MRI brain 12/2018: FINDINGS: personally reviewed images and agree Brain: Negative for acute infarct. 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 No significant white matter ischemia. Negative for  hemorrhage or mass. Mild atrophy without hydrocephalus.   Vascular: Normal arterial flow voids   Skull and upper cervical spine: Negative   Sinuses/Orbits: Mild mucosal edema paranasal sinuses. Bilateral cataract surgery   Other: None   IMPRESSION: No acute abnormality   Atrophy and mild chronic ischemic change including the pons.  Review of Systems: Patient complains of symptoms per HPI as well as the following symptoms headache. Pertinent negatives and positives per HPI. All others negative.   Social History   Socioeconomic History   Marital status: Widowed    Spouse name: Not on file   Number of children: Not on file   Years of education: Not on file   Highest education level: Not on file  Occupational History   Not on file  Tobacco Use   Smoking status: Never   Smokeless tobacco: Never  Vaping Use   Vaping Use: Never used  Substance and Sexual Activity   Alcohol use: No   Drug use: No   Sexual activity: Not Currently  Other Topics Concern   Not on file  Social History Narrative   Not on file   Social Determinants of Health   Financial Resource Strain: Not on file  Food Insecurity: Not on file  Transportation Needs: Not on file  Physical Activity: Not on file  Stress: Not on file  Social Connections: Not on file  Intimate Partner Violence: Not on file    Family History  Problem Relation Age of Onset   Stroke Mother    Diabetes Mother    Heart disease Mother        Before age 55   Hypertension Mother    Hyperlipidemia Mother    Heart disease Father    Heart attack Father    Hypertension Father    Heart disease Brother        After age 58- CABG   Hypertension Brother    Migraines Neg Hx     Past Medical History:  Diagnosis Date   Anxiety    Arthritis    COPD with exacerbation (HCC)    Diabetes mellitus without complication (HCC)    Diastolic dysfunction    Grade 1   Hyperlipidemia    Hypertension    Hypothyroidism    Migraines     Renal artery stenosis (HCC)    Left 50%   Sigmoid diverticulosis    Sinus problem    Splenic artery aneurysm (HCC)    36mm   Stroke [facility])    Thyroid disease     Patient Active Problem List   Diagnosis Date Noted   Migraine without aura and without status migrainosus, not intractable 01/12/2021   COPD with exacerbation (HCC)    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   Statin not tolerated 10/08/2019   Thyroid disease    Stroke [facility])    Sinus problem    Sigmoid diverticulosis    Migraines    Hypothyroidism    Hyperlipidemia    Diabetes mellitus without complication (HCC)    Arthritis    Anxiety    Renal artery stenosis (HCC)    Hyponatremia    Labile blood pressure    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   Oropharyngeal dysphagia    Diastolic dysfunction    Dysphagia, post-stroke    Hypokalemia    Stroke (cerebrum) (HCC) 12/17/2017   Anxiety state 12/17/2017   Splenic artery aneurysm (HCC) 12/05/2014   Left renal artery  stenosis (HCC) 12/05/2014   Type 2 diabetes, controlled, 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     Acetaminophen-Codeine 300-30 MG tablet Take by mouth.     aspirin 325 MG tablet Take 1 tablet (325 mg total) by mouth daily. (Patient taking differently: Take 325 mg by mouth daily. Takes 2 daily PRN)     atenolol (TENORMIN) 50 MG tablet Take 50 mg by mouth 2 (two) times daily.     Continuous Blood Gluc Receiver (FREESTYLE LIBRE 2 READER) DEVI      Continuous Blood Gluc Sensor (FREESTYLE LIBRE 2 SENSOR) MISC      diclofenac (FLECTOR) 1.3 % PTCH Place 1 patch onto the skin daily as needed.       eplerenone (INSPRA) 50 MG tablet Take 50 mg by mouth daily.     Evolocumab (REPATHA SURECLICK) 140 MG/ML SOAJ Inject 140 mg into the skin every 14 (fourteen) days. 2 mL 11   Galcanezumab-gnlm (EMGALITY) 120 MG/ML SOAJ Inject 120 mg into the skin every 30 (thirty) days. 1.12 mL 11   Galcanezumab-gnlm (EMGALITY) 120 MG/ML SOAJ Inject 240mg /23ml loading dose then in 30 days start 120mg /ml.  PA done and approved 02-03-21 [employer] FEP. 2.24 mL 0   lidocaine (LIDODERM) 5 % APPLY 1 PATCH TO INTACT SKIN REMOVE AFTER 12 HOURS ONCE A DAY TO NECK AS NEEDED     LORazepam (ATIVAN) 1 MG tablet Take 1 mg by mouth 3 (three) times daily as needed.     meclizine (ANTIVERT) 25 MG tablet Take 25 mg by mouth 2 (two) times daily as needed for dizziness.     olmesartan (BENICAR) 40 MG tablet Take 40 mg by mouth daily.     Rimegepant Sulfate (NURTEC) 75 MG TBDP Take one tablet daily onset migraine. (Max one tablet daily). 10 tablet 5   SYNTHROID 50 MCG tablet Take 100 mcg by mouth daily before breakfast.      No current facility-administered medications for this visit.    Allergies as of 02/12/2021 - Review Complete 01/20/2021  Allergen Reaction Noted   Amiloride Shortness Of Breath 02/01/2019   Blue dyes (parenteral) Shortness Of Breath, Rash, and Other (See Comments) 02/22/2013   Inderal la [propranolol hcl] Shortness Of Breath 10/02/2019   Sulfa antibiotics Anaphylaxis 02/22/2013   Ace inhibitors Nausea And Vomiting 12/05/2014   Norvasc [amlodipine] Nausea And Vomiting 12/05/2014   Calcium channel blockers Diarrhea 12/17/2017   Catapres [clonidine hcl]  02/01/2019   Clonidine derivatives Other (See Comments) 10/02/2019   Diovan [valsartan] Cough 10/02/2019   Hydralazine Other (See Comments) 10/02/2019   Metformin and related Diarrhea 10/02/2019   Micardis [telmisartan] Other (See Comments) 10/02/2019   Minoxidil Other (See Comments) 10/02/2019   Plavix [clopidogrel]  02/01/2019   Serotonin reuptake inhibitors  (ssris) Other (See Comments) 10/02/2019   Topamax [topiramate] Other (See Comments) 10/02/2019   Tramadol  02/05/2019   Tylenol with codeine #3 [acetaminophen-codeine] Other (See Comments) 10/02/2019   Zetia [ezetimibe]  02/05/2019   Amoxicillin Diarrhea 02/01/2019   Chlorthalidone  02/01/2019   Doxycycline Diarrhea 02/01/2019   Fosamax [alendronate sodium]  02/01/2019   Lexapro [escitalopram]  02/01/2019  Spironolactone Palpitations 02/01/2019   Statins  02/01/2019    Vitals: There were no vitals taken for this visit. Last Weight:  Wt Readings from Last 1 Encounters:  12/17/20 113 lb (51.3 kg)   Last Height:   Ht Readings from Last 1 Encounters:  12/17/20 5\' 1"  (1.549 m)     Physical exam: Exam: Gen: NAD, conversant, well nourised, well groomed                     CV: RRR, no MRG. No Carotid Bruits. No peripheral edema, warm, nontender Eyes: Conjunctivae clear without exudates or hemorrhage  Neuro: Detailed Neurologic Exam  Speech:    Speech is normal; fluent and spontaneous with normal comprehension.  Cognition:    The patient is oriented to person, place, and time;     recent and remote memory intact;     language fluent;     normal attention, concentration,     fund of knowledge Cranial Nerves:    The pupils are equal, round, and reactive to light. The fundi are normal and spontaneous venous pulsations are present. Visual fields are full to finger confrontation. Extraocular movements are intact. Trigeminal sensation is intact and the muscles of mastication are normal. The face is symmetric. The palate elevates in the midline. Hearing intact. Voice is normal. Shoulder shrug is normal. The tongue has normal motion without fasciculations.   Coordination:    Normal finger to nose and heel to shin. Normal rapid alternating movements.   Gait:   Slow, cautious, narrow-based, stumbled, with a cane.   Motor Observation:    No asymmetry, no atrophy, and no involuntary  movements noted. Tone:    Normal muscle tone.    Posture:    Slightly stooped    Strength:    Strength is equal and antigravity, no focal deficits, some generalized mild weakness     Sensation: intact to LT     Reflex Exam:  DTR's:    Deep tendon reflexes in the upper and lower extremities are brisk  bilaterally.   Toes:    The toes are downgoing bilaterally.   Clonus:    Clonus is absent.    Assessment/Plan:  80 y.o. female here as requested by 77, MD for headaches. Pmhx diabetes, headaches, COPD, statin myopathy, thyroid disease, stroke, migraines, hypothyroidism, hyperlipidemia, arthritis, anxiety, sinus problems, renal artery stenosis, hyponatremia, labile blood pressure, pontine cerebrovascular accident, oropharyngeal dysphagia, hypertension   Just here for a nurse visit to inject emgality We will help get her copay assistance or give her samples (wont let her pay $100) Philemon Kingdom also approved  NO CHARGE   No orders of the defined types were placed in this encounter.   No orders of the defined types were placed in this encounter.  Discussed: To prevent or relieve headaches, try the following: Cool Compress. Lie down and place a cool compress on your head.  Avoid headache triggers. If certain foods or odors seem to have triggered your migraines in the past, avoid them. A headache diary might help you identify triggers.  Include physical activity in your daily routine. Try a daily walk or other moderate aerobic exercise.  Manage stress. Find healthy ways to cope with the stressors, such as delegating tasks on your to-do list.  Practice relaxation techniques. Try deep breathing, yoga, massage and visualization.  Eat regularly. Eating regularly scheduled meals and maintaining a healthy diet might help prevent headaches. Also, drink plenty of fluids.  Follow a  regular sleep schedule. Sleep deprivation might contribute to headaches Consider biofeedback. With  this mind-body technique, you learn to control certain bodily functions -- such as muscle tension, heart rate and blood pressure -- to prevent headaches or reduce headache pain.    Proceed to emergency room if you experience new or worsening symptoms or symptoms do not resolve, if you have new neurologic symptoms or if headache is severe, or for any concerning symptom.   Provided education and documentation from American headache Society toolbox including articles on: chronic migraine medication overuse headache, chronic migraines, prevention of migraines, behavioral and other nonpharmacologic treatments for headache.    Cc: Prochnau, CarolPhilemon Kingdommon Kingdom, MD  Naomie Dean, MD  [facility] [address] [place], [place] [address]  Phone [phone] Fax [phone]

## 2021-02-14 ENCOUNTER — Encounter: Payer: Self-pay | Admitting: Neurology

## 2021-03-09 ENCOUNTER — Encounter: Payer: Self-pay | Admitting: Neurology

## 2021-03-10 NOTE — Telephone Encounter (Addendum)
[employer] application for [employer] completed to Dr. Lucia Gaskins to sign. Signed.  MD part of application was faxed with insurance cards, med list, demog, to [phone] FAX with confirmation received.  [id number] ov. Relayed that pts part to come later.

## 2021-03-11 ENCOUNTER — Encounter: Payer: Self-pay | Admitting: Neurology

## 2021-03-19 ENCOUNTER — Encounter: Payer: Self-pay | Admitting: Neurology

## 2021-03-21 ENCOUNTER — Encounter: Payer: Self-pay | Admitting: Neurology

## 2021-03-23 NOTE — Telephone Encounter (Signed)
Addressed in other mychart message

## 2021-03-31 ENCOUNTER — Telehealth: Payer: Self-pay | Admitting: *Deleted

## 2021-03-31 NOTE — Telephone Encounter (Signed)
Received pt's portion of application to [employer] for [employer] assistance. Prescription portion completed signed, and faxed to [employer] along with pt's portion, insurance, and allergies/medication sheet (requested by [employer]). Received a receipt of confirmation. Will update when we hear back. Per [employer], can take 10 business days to process.

## 2021-04-06 ENCOUNTER — Encounter: Payer: Self-pay | Admitting: Neurology

## 2021-04-06 ENCOUNTER — Encounter: Payer: Self-pay | Admitting: *Deleted

## 2021-04-06 ENCOUNTER — Ambulatory Visit (INDEPENDENT_AMBULATORY_CARE_PROVIDER_SITE_OTHER): Payer: Medicare Other | Admitting: Neurology

## 2021-04-06 DIAGNOSIS — R519 Headache, unspecified: Secondary | ICD-10-CM | POA: Diagnosis not present

## 2021-04-06 HISTORY — DX: Headache, unspecified: R51.9

## 2021-04-06 NOTE — Patient Instructions (Addendum)
[facility]    Provider:  Dr Lucia GaskinsAhern Requesting Provider: Philemon KingdomProchnau, Caroline, MD Primary Care Provider:  Philemon KingdomProchnau, Caroline, MD  CC:  headaches  04/06/2021: Some times she has mild, moderate or severe headaches but she has less severe headaches now. She is having mild headaches. She was switched off of the olmesartan then the new medication made her dizzy, her primary care told her to stop it but her blood pressure went up to 180 and she restarted it.  Patient keeps perseverating on medications.  I really do not know why she is having headaches, CT of the head was negative, I recommended getting an MRI of the brain I will talk to her about it again, MRI in the past in 2020  02/12/2021: Patient perseverates on medications causing her headaches.  I have had contact with Dr. Sudie BaileyProchnau who does not believe that her medications are playing a role.  We tried Vanuatubrelvy for her as she just wanted to start something acutely and not preventatively.  She continued to have migrainous headaches.  We got Vanu[place]ubrelvy approved. We got emgality approved. She is getting her first injections with training today. She reports unilateral migraines worsening > 3 months, pulsating/pounding/throbbing with nausea and light sensitivity, lasting 4-24 hours, movement makes it worse. She is on Technical sales engineermgality prevention and ubrelvy as needed.  Just here for a nurse viti to inject.  CT head 12/31/20: reviewed images: EXAM: CT HEAD WITHOUT AND WITH CONTRAST  TECHNIQUE: Contiguous axial images were obtained from the base of the skull through the vertex without and with intravenous contrast  CONTRAST: 100 mL Isovue 370  COMPARISON: CT head 12/24/2019  FINDINGS: Brain: No abnormal enhancement. No acute infarct, hemorrhage, mass, mass effect, or midline shift. No hydrocephalus or extra-axial collection. Cerebral volume is within normal limits for age.  Vascular: No hyperdense vessel. Atherosclerotic calcifications  in the intracranial carotid and vertebral arteries.  Skull: Normal. Negative for fracture or focal lesion.  Sinuses/Orbits: Status post bilateral maxillary antrostomies. Mild mucosal thickening in the ethmoid air cells. Status post bilateral lens replacements.  Other: The mastoids are well aerated.  IMPRESSION: No acute intracranial process. No etiology is seen for the patient's headaches.  HPI:  Eugene GarnetBarbara Anger is a 81 y.o. female here as requested by Philemon KingdomProchnau, Caroline, MD for headaches. Pmhx diabetes, headaches, COPD, statin myopathy, thyroid disease, stroke, migraines, hypothyroidism, hyperlipidemia, arthritis, anxiety, sinus problems, renal artery stenosis, hyponatremia, labile blood pressure, pontine cerebrovascular accident, oropharyngeal dysphagia, hypertension.  I reviewed Dr. Geoffery SpruceProchnow's notes: Patient sees a chiropractor, she has neck problems with dizziness and headaches.  I reviewed Epic notes, Patient was seen by Dr. Anne HahnWillis here in the office in December 2020 after stroke in January 03, 2018, she presented with generalized weakness and slurred speech that was acute in onset, at the time she was walking with a cane, she returned to the hospital on December 26, 2018 with vertigo that was positional in nature, MRI of the brain done at that time showed no acute changes, she started having headaches sometime in late summer 2020, but at the time she saw Dr. Anne HahnWillis on February 08, 2019 she states that the headaches had resolved the last 2 months after stopping a medication with a diet and at that she thinks she was allergic to or sensitive to.  She also complained of some low back pain and some occasional right leg pain that comes and goes, history of restless leg syndrome, she has not been able to tolerate statins in  the past.  Headaches are In the temples and feels like her whole head is pressure, pulsating, nausea, not much light sensitivity. Codeine helps but makes her dizzy. Headaches  started after the olmesartan and inspra. Not pulsating/pounding. Not photophobia or phonophobia, some mild nausea. Dramamine helps with the nausea but not dizziness. Sometimes unilateral, she is having headaches daily. Sometimes she wakes up with them. She is not excessively fatigued during the day, may feel a little weak. When she gets headaches she check blood pressure. Stopped taking the butalbital. She is taking a codeine. Laying down helps. Dizziness is from the codeine, no vision changes. Ongoing over a year, worsening, more frequent, some days they can be severe but other days mild, usually doesn't wake up in the morning with them but develops during the day, between 4-14 total headache days a month   Reviewed notes, labs and imaging from outside physicians, which showed:  Recent blood work(from referrng provider notes) includes normal TSH and T4, LDL 64, CBC normal, CMP with elevated glucose 128 otherwise normal, hemoglobin A1c 6.3.  From a thorough review of records, medications tried that can be used in migraine management include aspirin, meclizine, Fioricet, atenolol, gabapentin, Tylenol, amlodipine, aspirin, atenolol, Lexapro, losartan, meclizine, Mobic, Zofran, prednisone, Compazine injections, scopolamine patches, tizanidine, topiramate, amitriptyline  MRI brain 12/2018: FINDINGS: personally reviewed images and agree Brain: Negative for acute infarct. 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 No significant white matter ischemia. Negative for hemorrhage or mass. Mild atrophy without hydrocephalus.   Vascular: Normal arterial flow voids   Skull and upper cervical spine: Negative   Sinuses/Orbits: Mild mucosal edema paranasal sinuses. Bilateral cataract surgery   Other: None   IMPRESSION: No acute abnormality   Atrophy and mild chronic ischemic change including the pons.  Review of Systems: Patient complains of symptoms per HPI as well as the following symptoms headache.  Pertinent negatives and positives per HPI. All others negative.   Social History   Socioeconomic History   Marital status: Widowed    Spouse name: Not on file   Number of children: Not on file   Years of education: Not on file   Highest education level: Not on file  Occupational History   Not on file  Tobacco Use   Smoking status: Never   Smokeless tobacco: Never  Vaping Use   Vaping Use: Never used  Substance and Sexual Activity   Alcohol use: No   Drug use: No   Sexual activity: Not Currently  Other Topics Concern   Not on file  Social History Narrative   Lives at home with her dog    Right handed   Caffeine: maybe 1 cup/day   Social Determinants of Health   Financial Resource Strain: Not on file  Food Insecurity: Not on file  Transportation Needs: Not on file  Physical Activity: Not on file  Stress: Not on file  Social Connections: Not on file  Intimate Partner Violence: Not on file    Family History  Problem Relation Age of Onset   Stroke Mother    Diabetes Mother    Heart disease Mother        Before age 49   Hypertension Mother    Hyperlipidemia Mother    Heart disease Father    Heart attack Father    Hypertension Father    Heart disease Brother        After age 24- CABG   Hypertension Brother    Migraines Neg Hx  Past Medical History:  Diagnosis Date   Anxiety    Arthritis    COPD with exacerbation (HCC)    Diabetes mellitus without complication (HCC)    Diastolic dysfunction    Grade 1   Hyperlipidemia    Hypertension    Hypothyroidism    Migraines    Renal artery stenosis (HCC)    Left 50%   Sigmoid diverticulosis    Sinus problem    Splenic artery aneurysm (HCC)    58mm   Stroke [facility])    Thyroid disease     Patient Active Problem List   Diagnosis Date Noted   Migraine without aura and without status migrainosus, not intractable 01/12/2021   COPD with exacerbation (HCC)    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   Statin not tolerated  10/08/2019   Thyroid disease    Stroke [facility])    Sinus problem    Sigmoid diverticulosis    Migraines    Hypothyroidism    Hyperlipidemia    Diabetes mellitus without complication (HCC)    Arthritis    Anxiety    Renal artery stenosis (HCC)    Hyponatremia    Labile blood pressure    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   Oropharyngeal dysphagia    Diastolic dysfunction    Dysphagia, post-stroke    Hypokalemia    Stroke (cerebrum) (HCC) 12/17/2017   Anxiety state 12/17/2017   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     Acetaminophen-Codeine 300-30 MG tablet Take by mouth.     aspirin 325 MG tablet Take 1 tablet (325 mg total) by mouth daily. (Patient taking differently: Take 325 mg by mouth daily. Takes 2 daily PRN)     atenolol (TENORMIN) 50 MG tablet Take 50 mg by mouth 2 (two) times daily.     Continuous Blood Gluc Receiver (FREESTYLE LIBRE 2 READER) DEVI      Continuous Blood Gluc Sensor (FREESTYLE LIBRE 2 SENSOR) MISC      diclofenac (FLECTOR) 1.3 % PTCH Place 1 patch onto the skin daily as needed.      eplerenone (INSPRA) 50 MG tablet Take 50 mg by mouth daily.     Evolocumab (REPATHA SURECLICK) 140 MG/ML SOAJ Inject 140 mg into the skin every 14 (fourteen) days. 2 mL 11   Galcanezumab-gnlm (EMGALITY) 120 MG/ML SOAJ Inject 120 mg into the skin every 30 (thirty) days. 1.12 mL 11   lidocaine (LIDODERM) 5 % APPLY 1 PATCH TO INTACT SKIN REMOVE AFTER 12 HOURS ONCE A DAY TO NECK AS NEEDED      LORazepam (ATIVAN) 1 MG tablet Take 1 mg by mouth 3 (three) times daily as needed.     meclizine (ANTIVERT) 25 MG tablet Take 25 mg by mouth 2 (two) times daily as needed for dizziness.     olmesartan (BENICAR) 40 MG tablet Take 40 mg by mouth daily.     Rimegepant Sulfate (NURTEC) 75 MG TBDP Take one tablet daily onset migraine. (Max one tablet daily). 10 tablet 5  SYNTHROID 50 MCG tablet Take 100 mcg by mouth daily before breakfast.      No current facility-administered medications for this visit.    Allergies as of 04/06/2021 - Review Complete 02/12/2021  Allergen Reaction Noted   Amiloride Shortness Of Breath 02/01/2019   Blue dyes (parenteral) Shortness Of Breath, Rash, and Other (See Comments) 02/22/2013   Inderal la [propranolol hcl] Shortness Of Breath 10/02/2019   Sulfa antibiotics Anaphylaxis 02/22/2013   Ace inhibitors Nausea And Vomiting 12/05/2014   Norvasc [amlodipine] Nausea And Vomiting 12/05/2014   Calcium channel blockers Diarrhea 12/17/2017   Catapres [clonidine hcl]  02/01/2019   Clonidine derivatives Other (See Comments) 10/02/2019   Diovan [valsartan] Cough 10/02/2019   Hydralazine Other (See Comments) 10/02/2019   Metformin and related Diarrhea 10/02/2019   Micardis [telmisartan] Other (See Comments) 10/02/2019   Minoxidil Other (See Comments) 10/02/2019   Plavix [clopidogrel]  02/01/2019   Serotonin reuptake inhibitors (ssris) Other (See Comments) 10/02/2019   Topamax [topiramate] Other (See Comments) 10/02/2019   Tramadol  02/05/2019   Tylenol with codeine #3 [acetaminophen-codeine] Other (See Comments) 10/02/2019   Zetia [ezetimibe]  02/05/2019   Amoxicillin Diarrhea 02/01/2019   Chlorthalidone  02/01/2019   Doxycycline Diarrhea 02/01/2019   Fosamax [alendronate sodium]  02/01/2019   Lexapro [escitalopram]  02/01/2019   Spironolactone Palpitations 02/01/2019   Statins  02/01/2019    Vitals: There were no vitals taken for this visit. Last Weight:   Wt Readings from Last 1 Encounters:  02/12/21 111 lb (50.3 kg)   Last Height:   Ht Readings from Last 1 Encounters:  02/12/21 5\' 1"  (1.549 m)     Physical exam: Exam: Gen: NAD, conversant, well nourised, well groomed                     CV: RRR, no MRG. No Carotid Bruits. No peripheral edema, warm, nontender Eyes: Conjunctivae clear without exudates or hemorrhage  Neuro: Detailed Neurologic Exam  Speech:    Speech is normal; fluent and spontaneous with normal comprehension.  Cognition:    The patient is oriented to person, place, and time;     recent and remote memory intact;     language fluent;     normal attention, concentration,     fund of knowledge Cranial Nerves:    The pupils are equal, round, and reactive to light. The fundi are normal and spontaneous venous pulsations are present. Visual fields are full to finger confrontation. Extraocular movements are intact. Trigeminal sensation is intact and the muscles of mastication are normal. The face is symmetric. The palate elevates in the midline. Hearing intact. Voice is normal. Shoulder shrug is normal. The tongue has normal motion without fasciculations.   Coordination:    Normal finger to nose and heel to shin. Normal rapid alternating movements.   Gait:   Slow, cautious, narrow-based, stumbled, with a cane.   Motor Observation:    No asymmetry, no atrophy, and no involuntary movements noted. Tone:    Normal muscle tone.    Posture:    Slightly stooped    Strength:    Strength is equal and antigravity, no focal deficits, some generalized mild weakness     Sensation: intact to LT     Reflex Exam:  DTR's:    Deep tendon reflexes in the upper and lower extremities are brisk  bilaterally.   Toes:    The toes are downgoing bilaterally.   Clonus:    Clonus is absent.  Assessment/Plan:  81 y.o. female here as requested by Philemon Kingdom, MD for headaches. Pmhx diabetes, headaches, COPD, statin  myopathy, thyroid disease, stroke, migraines, hypothyroidism, hyperlipidemia, arthritis, anxiety, sinus problems, renal artery stenosis, hyponatremia, labile blood pressure, pontine cerebrovascular accident, oropharyngeal dysphagia, hypertension. Slightly improved. Still c/o headaches daily.    Continue emgality We will help get her copay assistance or give her samples (wont let her pay $100) Bernita Raisin also approved  Will try botox next week no charge   No orders of the defined types were placed in this encounter.   No orders of the defined types were placed in this encounter.  Discussed: To prevent or relieve headaches, try the following: Cool Compress. Lie down and place a cool compress on your head.  Avoid headache triggers. If certain foods or odors seem to have triggered your migraines in the past, avoid them. A headache diary might help you identify triggers.  Include physical activity in your daily routine. Try a daily walk or other moderate aerobic exercise.  Manage stress. Find healthy ways to cope with the stressors, such as delegating tasks on your to-do list.  Practice relaxation techniques. Try deep breathing, yoga, massage and visualization.  Eat regularly. Eating regularly scheduled meals and maintaining a healthy diet might help prevent headaches. Also, drink plenty of fluids.  Follow a regular sleep schedule. Sleep deprivation might contribute to headaches Consider biofeedback. With this mind-body technique, you learn to control certain bodily functions -- such as muscle tension, heart rate and blood pressure -- to prevent headaches or reduce headache pain.    Proceed to emergency room if you experience new or worsening symptoms or symptoms do not resolve, if you have new neurologic symptoms or if headache is severe, or for any concerning symptom.   Provided education and documentation from American headache Society toolbox including articles on: chronic migraine medication  overuse headache, chronic migraines, prevention of migraines, behavioral and other nonpharmacologic treatments for headache.    Cc: Philemon Kingdom, MD,  Philemon Kingdom, MD  Naomie Dean, MD  [facility] [address] [place], [place] [address]  Phone [phone] Fax [phone]

## 2021-04-06 NOTE — Progress Notes (Signed)
[facility]    Provider:  Dr Lucia Gaskins Requesting Provider: Philemon Kingdom, MD Primary Care Provider:  Philemon Kingdom, MD  CC:  headaches  Virtual Visit via Telephone Note  I connected with Brandy Flowers on 04/06/21 at  2:30 PM EST by telephone and verified that I am speaking with the correct person using two identifiers.  Location: Patient: home Provider: office   I discussed the limitations, risks, security and privacy concerns of performing an evaluation and management service by telephone and the availability of in person appointments. I also discussed with the patient that there may be a patient responsible charge related to this service. The patient expressed understanding and agreed to proceed.   Follow Up Instructions:    I discussed the assessment and treatment plan with the patient. The patient was provided an opportunity to ask questions and all were answered. The patient agreed with the plan and demonstrated an understanding of the instructions.   The patient was advised to call back or seek an in-person evaluation if the symptoms worsen or if the condition fails to improve as anticipated.  I provided 25 minutes of non-face-to-face time during this encounter.   Brandy Fret, MD  April 06, 2021: Patient states she has less headaches but she is still having them, they can be bowel moderate to severe, she still perseverates on medication even though Dr. Blanchard Mane took her off olmesartan she said the new medication made her dizzy the coming off of it increased her blood pressure to 180, I had not know why she started having headaches, CT of the head in November 2022 showed small vessel disease but nothing acute reviewed images, she had an MRI in 2020 which showed No acute abnormality, Atrophy and mild chronic ischemic change including the pons, I reviewed that as well.  The prior year she had an MRI 12/2017 with Worsening brainstem infarction  bilateral pons. We discussed. May recommend MRI brain and MRV of the head. We discussed all above.   Patient complains of symptoms per HPI as well as the following symptoms: headache . Pertinent negatives and positives per HPI. All others negative   02/12/2021: Patient perseverates on medications causing her headaches.  I have had contact with Dr. [name] who does not believe that her medications are playing a role.  We tried [name] for her as she just wanted to start something acutely and not preventatively.  She continued to have migrainous headaches.  We got [name] approved. We got emgality approved. She is getting her first injections with training today. She reports unilateral migraines worsening > 3 months, pulsating/pounding/throbbing with nausea and light sensitivity, lasting 4-24 hours, movement makes it worse. She is on Technical sales engineer and ubrelvy as needed.  Just here for a nurse viti to inject.  HPI:  Brandy Flowers is a 81 y.o. female here as requested by Philemon Kingdom, MD for headaches. Pmhx diabetes, headaches, COPD, statin myopathy, thyroid disease, stroke, migraines, hypothyroidism, hyperlipidemia, arthritis, anxiety, sinus problems, renal artery stenosis, hyponatremia, labile blood pressure, pontine cerebrovascular accident, oropharyngeal dysphagia, hypertension.  I reviewed Dr. Geoffery Spruce notes: Patient sees a chiropractor, she has neck problems with dizziness and headaches.  I reviewed Epic notes, Patient was seen by Dr. Anne Hahn here in the office in December 2020 after stroke in January 03, 2018, she presented with generalized weakness and slurred speech that was acute in onset, at the time she was walking with a cane, she returned to the hospital on December 26, 2018 with  vertigo that was positional in nature, MRI of the brain done at that time showed no acute changes, she started having headaches sometime in late summer 2020, but at the time she saw Dr. Anne Hahn on February 08, 2019 she states that the headaches had resolved the last 2 months after stopping a medication with a diet and at that she thinks she was allergic to or sensitive to.  She also complained of some low back pain and some occasional right leg pain that comes and goes, history of restless leg syndrome, she has not been able to tolerate statins in the past.  Headaches are In the temples and feels like her whole head is pressure, pulsating, nausea, not much light sensitivity. Codeine helps but makes her dizzy. Headaches started after the olmesartan and inspra. Not pulsating/pounding. Not photophobia or phonophobia, some mild nausea. Dramamine helps with the nausea but not dizziness. Sometimes unilateral, she is having headaches daily. Sometimes she wakes up with them. She is not excessively fatigued during the day, may feel a little weak. When she gets headaches she check blood pressure. Stopped taking the butalbital. She is taking a codeine. Laying down helps. Dizziness is from the codeine, no vision changes. Ongoing over a year, worsening, more frequent, some days they can be severe but other days mild, usually doesn't wake up in the morning with them but develops during the day, between 4-14 total headache days a month   Reviewed notes, labs and imaging from outside physicians, which showed:  Recent blood work(from referrng provider notes) includes normal TSH and T4, LDL 64, CBC normal, CMP with elevated glucose 128 otherwise normal, hemoglobin A1c 6.3.  From a thorough review of records, medications tried that can be used in migraine management include aspirin, meclizine, Fioricet, atenolol, gabapentin, Tylenol, amlodipine, aspirin, atenolol, Lexapro, losartan, meclizine, Mobic, Zofran, prednisone, Compazine injections, scopolamine patches, tizanidine, topiramate, amitriptyline  MRI brain 12/2018: FINDINGS: personally reviewed images and agree Brain: Negative for acute infarct. 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 No significant white matter ischemia. Negative for hemorrhage or mass. Mild atrophy without hydrocephalus.   Vascular: Normal arterial flow voids   Skull and upper cervical spine: Negative   Sinuses/Orbits: Mild mucosal edema paranasal sinuses. Bilateral cataract surgery   Other: None   IMPRESSION: No acute abnormality   Atrophy and mild chronic ischemic change including the pons.  Review of Systems: Patient complains of symptoms per HPI as well as the following symptoms headache. Pertinent negatives and positives per HPI. All others negative.   Social History   Socioeconomic History   Marital status: Widowed    Spouse name: Not on file   Number of children: Not on file   Years of education: Not on file   Highest education level: Not on file  Occupational History   Not on file  Tobacco Use   Smoking status: Never   Smokeless tobacco: Never  Vaping Use   Vaping Use: Never used  Substance and Sexual Activity   Alcohol use: No   Drug use: No   Sexual activity: Not Currently  Other Topics Concern   Not on file  Social History Narrative   Lives at home with her dog    Right handed   Caffeine: maybe 1 cup/day   Social Determinants of Health   Financial Resource Strain: Not on file  Food Insecurity: Not on file  Transportation Needs: Not on file  Physical Activity: Not on file  Stress: Not on file  Social  Connections: Not on file  Intimate Partner Violence: Not on file    Family History  Problem Relation Age of Onset   Stroke Mother    Diabetes Mother    Heart disease Mother        Before age 80   Hypertension Mother    Hyperlipidemia Mother    Heart disease Father    Heart attack Father    Hypertension Father    Heart disease Brother        After age 80- CABG   Hypertension Brother    Migraines Neg Hx     Past Medical History:  Diagnosis Date   Anxiety    Arthritis    COPD with exacerbation (HCC)    Diabetes mellitus without  complication (HCC)    Diastolic dysfunction    Grade 1   Hyperlipidemia    Hypertension    Hypothyroidism    Migraines    Renal artery stenosis (HCC)    Left 50%   Sigmoid diverticulosis    Sinus problem    Splenic artery aneurysm (HCC)    7mm   Stroke [facility](HCC)    Thyroid disease     Patient Active Problem List   Diagnosis Date Noted   Migraine without aura and without status migrainosus, not intractable 01/12/2021   COPD with exacerbation (HCC)    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   Statin not tolerated 10/08/2019   Thyroid disease    Stroke [facility](HCC)    Sinus problem    Sigmoid diverticulosis    Migraines    Hypothyroidism    Hyperlipidemia    Diabetes mellitus without complication (HCC)    Arthritis    Anxiety    Renal artery stenosis (HCC)    Hyponatremia    Labile blood pressure    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   Oropharyngeal dysphagia    Diastolic dysfunction    Dysphagia, post-stroke    Hypokalemia    Stroke (cerebrum) (HCC) 12/17/2017   Anxiety state 12/17/2017   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     Acetaminophen-Codeine 300-30 MG tablet Take by mouth.     atenolol (TENORMIN) 50 MG tablet Take 50 mg by mouth 2 (two) times daily.     Continuous Blood Gluc Receiver (FREESTYLE LIBRE 2 READER) DEVI      Continuous Blood Gluc Sensor (FREESTYLE LIBRE 2 SENSOR) MISC      diclofenac (FLECTOR) 1.3 % PTCH Place 1 patch onto the skin daily as needed.      Evolocumab (REPATHA  SURECLICK) 140 MG/ML SOAJ Inject 140 mg into the skin every 14 (fourteen) days. 2 mL 11   Galcanezumab-gnlm (EMGALITY) 120 MG/ML SOAJ Inject 120 mg into the skin every 30 (thirty) days. 1.12 mL 11   lidocaine (LIDODERM) 5 % APPLY 1 PATCH TO INTACT SKIN REMOVE AFTER 12 HOURS ONCE A DAY TO NECK AS NEEDED     LORazepam (ATIVAN) 1 MG tablet Take 1 mg by mouth 3 (three) times daily as needed.  meclizine (ANTIVERT) 25 MG tablet Take 25 mg by mouth 2 (two) times daily as needed for dizziness.     SYNTHROID 50 MCG tablet Take 100 mcg by mouth daily before breakfast.      No current facility-administered medications for this visit.    Allergies as of 04/06/2021 - Review Complete 02/12/2021  Allergen Reaction Noted   Amiloride Shortness Of Breath 02/01/2019   Blue dyes (parenteral) Shortness Of Breath, Rash, and Other (See Comments) 02/22/2013   Inderal la [propranolol hcl] Shortness Of Breath 10/02/2019   Sulfa antibiotics Anaphylaxis 02/22/2013   Ace inhibitors Nausea And Vomiting 12/05/2014   Norvasc [amlodipine] Nausea And Vomiting 12/05/2014   Calcium channel blockers Diarrhea 12/17/2017   Catapres [clonidine hcl]  02/01/2019   Clonidine derivatives Other (See Comments) 10/02/2019   Diovan [valsartan] Cough 10/02/2019   Hydralazine Other (See Comments) 10/02/2019   Metformin and related Diarrhea 10/02/2019   Micardis [telmisartan] Other (See Comments) 10/02/2019   Minoxidil Other (See Comments) 10/02/2019   Plavix [clopidogrel]  02/01/2019   Serotonin reuptake inhibitors (ssris) Other (See Comments) 10/02/2019   Topamax [topiramate] Other (See Comments) 10/02/2019   Tramadol  02/05/2019   Tylenol with codeine #3 [acetaminophen-codeine] Other (See Comments) 10/02/2019   Zetia [ezetimibe]  02/05/2019   Amoxicillin Diarrhea 02/01/2019   Chlorthalidone  02/01/2019   Doxycycline Diarrhea 02/01/2019   Fosamax [alendronate sodium]  02/01/2019   Lexapro [escitalopram]  02/01/2019    Spironolactone Palpitations 02/01/2019   Statins  02/01/2019    Vitals: There were no vitals taken for this visit. Last Weight:  Wt Readings from Last 1 Encounters:  02/12/21 111 lb (50.3 kg)   Last Height:   Ht Readings from Last 1 Encounters:  02/12/21 5\' 1"  (1.549 m)    Physical exam: Exam: Gen: NAD, conversant Neuro: Detailed Neurologic Exam  Speech:    Speech is normal; fluent and spontaneous with normal comprehension.  Cognition:    The patient is oriented to person, place, and time;     recent and remote memory intact;     language fluent;     normal attention, concentration,     fund of knowledge    Assessment/Plan:  81 y.o. female here as requested by 77, MD for headaches. Pmhx diabetes, headaches, COPD, statin myopathy, thyroid disease, stroke, migraines, hypothyroidism, hyperlipidemia, arthritis, anxiety, sinus problems, renal artery stenosis, hyponatremia, labile blood pressure, pontine cerebrovascular accident, oropharyngeal dysphagia, hypertension.  She had a CT of the head at the end of 2022 which was negative for acute abnormality, but headaches were acute, they are chronic daily, she perseverates on medication causing it by Dr. 2023 has changed her medications multiple times, we have her on Emgality and she might be getting a little better, I will try her on Botox.  At this point she may need an MRI of the brain and an MRV of the head.  Continue Emgality Recommend MRI of the brain and MRV, really no idea why she had these acute onset persistent chronic daily headaches that are intractable We will help get her copay assistance or give her samples (wont let her pay $100) Blanchard Mane also approved  We can try Botox I will give her some samples with no charge. NO CHARGE   No orders of the defined types were placed in this encounter.   No orders of the defined types were placed in this encounter.  Discussed: To prevent or relieve headaches,  try the following: Cool Compress. Lie down and place  a cool compress on your head.  Avoid headache triggers. If certain foods or odors seem to have triggered your migraines in the past, avoid them. A headache diary might help you identify triggers.  Include physical activity in your daily routine. Try a daily walk or other moderate aerobic exercise.  Manage stress. Find healthy ways to cope with the stressors, such as delegating tasks on your to-do list.  Practice relaxation techniques. Try deep breathing, yoga, massage and visualization.  Eat regularly. Eating regularly scheduled meals and maintaining a healthy diet might help prevent headaches. Also, drink plenty of fluids.  Follow a regular sleep schedule. Sleep deprivation might contribute to headaches Consider biofeedback. With this mind-body technique, you learn to control certain bodily functions -- such as muscle tension, heart rate and blood pressure -- to prevent headaches or reduce headache pain.    Proceed to emergency room if you experience new or worsening symptoms or symptoms do not resolve, if you have new neurologic symptoms or if headache is severe, or for any concerning symptom.   Provided education and documentation from American headache Society toolbox including articles on: chronic migraine medication overuse headache, chronic migraines, prevention of migraines, behavioral and other nonpharmacologic treatments for headache.    Cc: Philemon KingdomProchnau, Caroline, MD,  Philemon KingdomProchnau, Caroline, MD  Naomie DeanAntonia Cruz Bong, MD  [facility] [address] [place], [place] [id number]  Phone [phone] Fax [phone]

## 2021-04-08 DIAGNOSIS — I340 Nonrheumatic mitral (valve) insufficiency: Secondary | ICD-10-CM | POA: Diagnosis not present

## 2021-04-08 DIAGNOSIS — I361 Nonrheumatic tricuspid (valve) insufficiency: Secondary | ICD-10-CM | POA: Diagnosis not present

## 2021-04-09 ENCOUNTER — Ambulatory Visit (INDEPENDENT_AMBULATORY_CARE_PROVIDER_SITE_OTHER): Payer: Medicare Other | Admitting: Neurology

## 2021-04-09 ENCOUNTER — Encounter: Payer: Self-pay | Admitting: Neurology

## 2021-04-09 DIAGNOSIS — G08 Intracranial and intraspinal phlebitis and thrombophlebitis: Secondary | ICD-10-CM

## 2021-04-09 DIAGNOSIS — R51 Headache with orthostatic component, not elsewhere classified: Secondary | ICD-10-CM | POA: Diagnosis not present

## 2021-04-09 DIAGNOSIS — R519 Headache, unspecified: Secondary | ICD-10-CM

## 2021-04-09 DIAGNOSIS — G8929 Other chronic pain: Secondary | ICD-10-CM

## 2021-04-09 DIAGNOSIS — R531 Weakness: Secondary | ICD-10-CM

## 2021-04-09 DIAGNOSIS — G4453 Primary thunderclap headache: Secondary | ICD-10-CM

## 2021-04-09 DIAGNOSIS — H539 Unspecified visual disturbance: Secondary | ICD-10-CM | POA: Diagnosis not present

## 2021-04-09 DIAGNOSIS — R29898 Other symptoms and signs involving the musculoskeletal system: Secondary | ICD-10-CM

## 2021-04-09 HISTORY — DX: Headache, unspecified: R51.9

## 2021-04-09 NOTE — Progress Notes (Signed)
[facility]    Provider:  Dr Jaynee Eagles Requesting Provider: Ernestene Kiel, MD Primary Care Provider:  Ernestene Kiel, MD  CC:  headaches  April 09, 2021:Chronic daily severe worsening headache despite treatment, acute onset, unrelenting.  Patient has had unrelenting headaches since November.  It was acute in onset.  At that time at the end of 2022 she had an unremarkable CT scan of the head however the headaches have not relented.  If they are daily, they are nocturnal and morning, they are worse positionally, she is having vision changes, she has focal motor symptoms.  In the beginning she perseverated on new medication, since then her medication has been changed multiple times without relief.  In order to avoid any more questions to medication side effects, we have treated her with the most recent and past headache/migraine medications including the CGRP injections, Ubrelvy and Nurtec, and today I am using samples of Botox to see if that will help.  At this time we have tried many modalities, considering all her concerning symptoms despite CAT scan being unremarkable I think at this time she needs an MRI of the brain which is much more sensitive than a CT scan of the head, we need to evaluate for any malignancies, strokes, demyelination, vasculitides, space-occupying masses, or anything else that could cause headaches.  She also needs an MRV to rule out venous thrombosis given her focal motor weakness. CT head was unremarkable 12/2020 but at this point needs MRI/MRV. On Emgality. Trying botox on her today. If negative consider a lumbar puncture  Patient complains of symptoms per HPI as well as the following symptoms: severe headaches . Pertinent negatives and positives per HPI. All others negative   Consent Form Botulism Toxin Injection For Chronic Migraine    Reviewed orally with patient, additionally signature is on file:  Botulism toxin has been approved by the  Federal drug administration for treatment of chronic migraine. Botulism toxin does not cure chronic migraine and it may not be effective in some patients.  The administration of botulism toxin is accomplished by injecting a small amount of toxin into the muscles of the neck and head. Dosage must be titrated for each individual. Any benefits resulting from botulism toxin tend to wear off after 3 months with a repeat injection required if benefit is to be maintained. Injections are usually done every 3-4 months with maximum effect peak achieved by about 2 or 3 weeks. Botulism toxin is expensive and you should be sure of what costs you will incur resulting from the injection.  The side effects of botulism toxin use for chronic migraine may include:   -Transient, and usually mild, facial weakness with facial injections  -Transient, and usually mild, head or neck weakness with head/neck injections  -Reduction or loss of forehead facial animation due to forehead muscle weakness  -Eyelid drooping  -Dry eye  -Pain at the site of injection or bruising at the site of injection  -Double vision  -Potential unknown long term risks  Contraindications: You should not have Botox if you are pregnant, nursing, allergic to albumin, have an infection, skin condition, or muscle weakness at the site of the injection, or have myasthenia gravis, Lambert-Eaton syndrome, or ALS.  It is also possible that as with any injection, there may be an allergic reaction or no effect from the medication. Reduced effectiveness after repeated injections is sometimes seen and rarely infection at the injection site may occur. All care will be taken to prevent  these side effects. If therapy is given over a long time, atrophy and wasting in the muscle injected may occur. Occasionally the patient's become refractory to treatment because they develop antibodies to the toxin. In this event, therapy needs to be modified.  I have read the above  information and consent to the administration of botulism toxin.    BOTOX PROCEDURE NOTE FOR MIGRAINE HEADACHE    Contraindications and precautions discussed with patient(above). Aseptic procedure was observed and patient tolerated procedure. Procedure performed by Dr. Georgia Dom  The condition has existed for more than 6 months, and pt does not have a diagnosis of ALS, Myasthenia Gravis or Lambert-Eaton Syndrome.  Risks and benefits of injections discussed and pt agrees to proceed with the procedure.  Written consent obtained  These injections are medically necessary. Pt  receives good benefits from these injections. These injections do not cause sedations or hallucinations which the oral therapies may cause.  Description of procedure:  The patient was placed in a sitting position. The standard protocol was used for Botox as follows, with 5 units of Botox injected at each site:   -Procerus muscle, midline injection  -Corrugator muscle, bilateral injection  -Frontalis muscle, bilateral injection, with 2 sites each side, medial injection was performed in the upper one third of the frontalis muscle, in the region vertical from the medial inferior edge of the superior orbital rim. The lateral injection was again in the upper one third of the forehead vertically above the lateral limbus of the cornea, 1.5 cm lateral to the medial injection site.  -Temporalis muscle injection, 4 sites, bilaterally. The first injection was 3 cm above the tragus of the ear, second injection site was 1.5 cm to 3 cm up from the first injection site in line with the tragus of the ear. The third injection site was 1.5-3 cm forward between the first 2 injection sites. The fourth injection site was 1.5 cm posterior to the second injection site.   -Occipitalis muscle injection, 3 sites, bilaterally. The first injection was done one half way between the occipital protuberance and the tip of the mastoid process behind the  ear. The second injection site was done lateral and superior to the first, 1 fingerbreadth from the first injection. The third injection site was 1 fingerbreadth superiorly and medially from the first injection site.  -Cervical paraspinal muscle injection, 2 sites, bilateral knee first injection site was 1 cm from the midline of the cervical spine, 3 cm inferior to the lower border of the occipital protuberance. The second injection site was 1.5 cm superiorly and laterally to the first injection site.  -Trapezius muscle injection was performed at 3 sites, bilaterally. The first injection site was in the upper trapezius muscle halfway between the inflection point of the neck, and the acromion. The second injection site was one half way between the acromion and the first injection site. The third injection was done between the first injection site and the inflection point of the neck.   Will return for repeat injection in 3 months.   155 units of Botox was used, 45u Botox not injected was wasted. The patient tolerated the procedure well, there were no complications of the above procedure.    April 06, 2021: Patient states she has less headaches but she is still having them, they can be bowel moderate to severe, she still perseverates on medication even though Dr. Sandrea Hughs took her off olmesartan she said the new medication made her dizzy the coming  off of it increased her blood pressure to 180, I had not know why she started having headaches, CT of the head in November 2022 showed small vessel disease but nothing acute reviewed images, she had an MRI in 2020 which showed No acute abnormality, Atrophy and mild chronic ischemic change including the pons, I reviewed that as well.  The prior year she had an MRI 12/2017 with Worsening brainstem infarction bilateral pons. We discussed. May recommend MRI brain and MRV of the head. We discussed all above.   Patient complains of symptoms per HPI as well as  the following symptoms: headache . Pertinent negatives and positives per HPI. All others negative   02/12/2021: Patient perseverates on medications causing her headaches.  I have had contact with Dr. Laqueta Due who does not believe that her medications are playing a role.  We tried [place] for her as she just wanted to start something acutely and not preventatively.  She continued to have migrainous headaches.  We got [place] approved. We got emgality approved. She is getting her first injections with training today. She reports unilateral migraines worsening > 3 months, pulsating/pounding/throbbing with nausea and light sensitivity, lasting 4-24 hours, movement makes it worse. She is on Chief Executive Officer and ubrelvy as needed.  Just here for a nurse viti to inject.  HPI:  Brandy Flowers is a 81 y.o. female here as requested by Ernestene Kiel, MD for headaches. Pmhx diabetes, headaches, COPD, statin myopathy, thyroid disease, stroke, migraines, hypothyroidism, hyperlipidemia, arthritis, anxiety, sinus problems, renal artery stenosis, hyponatremia, labile blood pressure, pontine cerebrovascular accident, oropharyngeal dysphagia, hypertension.  I reviewed Dr. Noe Gens notes: Patient sees a chiropractor, she has neck problems with dizziness and headaches.  I reviewed Epic notes, Patient was seen by Dr. Jannifer Franklin here in the office in December 2020 after stroke in January 03, 2018, she presented with generalized weakness and slurred speech that was acute in onset, at the time she was walking with a cane, she returned to the hospital on December 26, 2018 with vertigo that was positional in nature, MRI of the brain done at that time showed no acute changes, she started having headaches sometime in late summer 2020, but at the time she saw Dr. Jannifer Franklin on February 08, 2019 she states that the headaches had resolved the last 2 months after stopping a medication with a diet and at that she thinks she was allergic to  or sensitive to.  She also complained of some low back pain and some occasional right leg pain that comes and goes, history of restless leg syndrome, she has not been able to tolerate statins in the past.  Headaches are In the temples and feels like her whole head is pressure, pulsating, nausea, not much light sensitivity. Codeine helps but makes her dizzy. Headaches started after the olmesartan and inspra. Not pulsating/pounding. Not photophobia or phonophobia, some mild nausea. Dramamine helps with the nausea but not dizziness. Sometimes unilateral, she is having headaches daily. Sometimes she wakes up with them. She is not excessively fatigued during the day, may feel a little weak. When she gets headaches she check blood pressure. Stopped taking the butalbital. She is taking a codeine. Laying down helps. Dizziness is from the codeine, no vision changes. Ongoing over a year, worsening, more frequent, some days they can be severe but other days mild, usually doesn't wake up in the morning with them but develops during the day, between 4-14 total headache days a month   Reviewed notes, labs and  imaging from outside physicians, which showed:  Recent blood work(from referrng provider notes) includes normal TSH and T4, LDL 64, CBC normal, CMP with elevated glucose 128 otherwise normal, hemoglobin A1c 6.3.  From a thorough review of records, medications tried that can be used in migraine management include aspirin, meclizine, Fioricet, atenolol, gabapentin, Tylenol, amlodipine, aspirin, atenolol, Lexapro, losartan, meclizine, Mobic, Zofran, prednisone, Compazine injections, scopolamine patches, tizanidine, topiramate, amitriptyline  MRI brain 12/2018: FINDINGS: personally reviewed images and agree Brain: Negative for acute infarct. 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 No significant white matter ischemia. Negative for hemorrhage or mass. Mild atrophy without hydrocephalus.   Vascular: Normal arterial  flow voids   Skull and upper cervical spine: Negative   Sinuses/Orbits: Mild mucosal edema paranasal sinuses. Bilateral cataract surgery   Other: None   IMPRESSION: No acute abnormality   Atrophy and mild chronic ischemic change including the pons.  Review of Systems: Patient complains of symptoms per HPI as well as the following symptoms headache. Pertinent negatives and positives per HPI. All others negative.   Social History   Socioeconomic History   Marital status: Widowed    Spouse name: Not on file   Number of children: Not on file   Years of education: Not on file   Highest education level: Not on file  Occupational History   Not on file  Tobacco Use   Smoking status: Never   Smokeless tobacco: Never  Vaping Use   Vaping Use: Never used  Substance and Sexual Activity   Alcohol use: No   Drug use: No   Sexual activity: Not Currently  Other Topics Concern   Not on file  Social History Narrative   Lives at home with her dog    Right handed   Caffeine: maybe 1 cup/day   Social Determinants of Health   Financial Resource Strain: Not on file  Food Insecurity: Not on file  Transportation Needs: Not on file  Physical Activity: Not on file  Stress: Not on file  Social Connections: Not on file  Intimate Partner Violence: Not on file    Family History  Problem Relation Age of Onset   Stroke Mother    Diabetes Mother    Heart disease Mother        Before age 96   Hypertension Mother    Hyperlipidemia Mother    Heart disease Father    Heart attack Father    Hypertension Father    Heart disease Brother        After age 65- CABG   Hypertension Brother    Migraines Neg Hx     Past Medical History:  Diagnosis Date   Anxiety    Arthritis    COPD with exacerbation ([place])    Diabetes mellitus without complication (HCC)    Diastolic dysfunction    Grade 1   Hyperlipidemia    Hypertension    Hypothyroidism    Migraines    Renal artery stenosis (HCC)     Left 50%   Sigmoid diverticulosis    Sinus problem    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   [facility]    Thyroid disease     Patient Active Problem List   Diagnosis Date Noted   Chronic intractable headache 04/09/2021   New onset headache 04/06/2021   Chronic daily headache 04/06/2021   Migraine without aura and without status migrainosus, not intractable 01/12/2021   COPD with exacerbation (HCC)    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   Statin not tolerated 10/08/2019   Thyroid disease    Stroke [facility])    Sinus problem    Sigmoid diverticulosis    Migraines    Hypothyroidism    Hyperlipidemia    Diabetes mellitus without complication (HCC)    Arthritis    Anxiety    Renal artery stenosis (HCC)    Hyponatremia    Labile blood pressure    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   Oropharyngeal dysphagia    Diastolic dysfunction    Dysphagia, post-stroke    Hypokalemia    Stroke (cerebrum) ([place]) 12/17/2017   Anxiety state 12/17/2017   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     Acetaminophen-Codeine 300-30 MG tablet Take by mouth.     atenolol (TENORMIN) 50 MG tablet Take 50 mg by mouth 2 (two) times daily.     Continuous Blood Gluc Receiver (FREESTYLE LIBRE 2 READER) DEVI      Continuous Blood Gluc Sensor (FREESTYLE LIBRE 2 SENSOR) MISC      diclofenac (FLECTOR) 1.3 % PTCH Place 1 patch onto the skin daily as needed.      Evolocumab (REPATHA SURECLICK) 939 MG/ML SOAJ Inject 140 mg into the  skin every 14 (fourteen) days. 2 mL 11   Galcanezumab-gnlm (EMGALITY) 120 MG/ML SOAJ Inject 120 mg into the skin every 30 (thirty) days. 1.12 mL 11   lidocaine (LIDODERM) 5 % APPLY 1 PATCH TO INTACT SKIN REMOVE AFTER 12 HOURS ONCE A DAY TO NECK AS NEEDED     LORazepam (ATIVAN) 1 MG tablet Take 1 mg by mouth 3 (three) times daily as needed.     losartan (COZAAR) 50 MG tablet Take 50 mg by mouth 2 (two) times daily.     meclizine (ANTIVERT) 25 MG tablet Take 25 mg by mouth 2 (two) times daily as needed for dizziness.     SYNTHROID 50 MCG tablet Take 100 mcg by mouth daily before breakfast.      No current facility-administered medications for this visit.    Allergies as of 04/09/2021 - Review Complete 02/12/2021  Allergen Reaction Noted   Amiloride Shortness Of Breath 02/01/2019   Blue dyes (parenteral) Shortness Of Breath, Rash, and Other (See Comments) 02/22/2013   Inderal la [propranolol hcl] Shortness Of Breath 10/02/2019   Sulfa antibiotics Anaphylaxis 02/22/2013   Ace inhibitors Nausea And Vomiting 12/05/2014   Norvasc [amlodipine] Nausea And Vomiting 12/05/2014   Calcium channel blockers Diarrhea 12/17/2017   Catapres [clonidine hcl]  02/01/2019   Clonidine derivatives Other (See Comments) 10/02/2019   Diovan [valsartan] Cough 10/02/2019   Hydralazine Other (See Comments) 10/02/2019   Metformin and related Diarrhea 10/02/2019   Micardis [telmisartan] Other (See Comments) 10/02/2019   Minoxidil Other (See Comments) 10/02/2019   Plavix [clopidogrel]  02/01/2019   Serotonin reuptake inhibitors (ssris) Other (See Comments) 10/02/2019   Sulfur  04/09/2021   Topamax [topiramate] Other (See Comments) 10/02/2019   Tramadol  02/05/2019   Tylenol with codeine #3 [acetaminophen-codeine] Other (See Comments) 10/02/2019  Zetia [ezetimibe]  02/05/2019   Amoxicillin Diarrhea 02/01/2019   Chlorthalidone  02/01/2019   Doxycycline Diarrhea 02/01/2019   Fosamax [alendronate sodium]   02/01/2019   Lexapro [escitalopram]  02/01/2019   Spironolactone Palpitations 02/01/2019   Statins  02/01/2019    Vitals: There were no vitals taken for this visit. Last Weight:  Wt Readings from Last 1 Encounters:  02/12/21 111 lb (50.3 kg)   Last Height:   Ht Readings from Last 1 Encounters:  02/12/21 5' 1"  (1.549 m)    Physical exam: Exam: Constitutional: female who appears frail Mentation: Alert and oriented, good historian, able to follow multi-step commands Gen: appears to have a headache,slightly distressed, but conversant HENT: Normocephalic/atraumatic. Mucous membranes are moist. Posterior pharynx clear of any exudate or lesions.  Eyes: Conjunctivae clear without exudates or hemorrhage Lungs: clear to auscultation. Pulmonary effort is normal. No respiratory distress.Normal breath sound Abdomen: soft, non tender, no masses palpated. Bowel sounds positive. Neck: normal, supple, no masses, no thyromegaly                CV: RRR, no MRG. No Carotid Bruits. No peripheral edema, warm, nontender MSK: No pain on palpation of limbs; General: No swelling, no clubbing / cyanosis. No joint deformity upper and lower extremities. Good ROM, no contractures. Normal muscle tone.  Skin:General: Skin is warm and dry.no rashes, lesions, ulcers. No induration   Neuro: Detailed Neurologic Exam  Speech:    Speech is normal; fluent and spontaneous with normal comprehension.  Cognition:    The patient is oriented to person, place, and time;     recent and remote memory intact;     language fluent;     normal attention, concentration,     fund of knowledge Cranial Nerves:    The pupils are equal, round, and reactive to light. Pupils too small to visualize. Visual fields are full to finger confrontation. Extraocular movements are intact. Trigeminal sensation is intact and the muscles of mastication are normal. The face is symmetric. The palate elevates in the midline. Hearing intact. Voice  is normal. Shoulder shrug is normal. The tongue has normal motion without fasciculations.   Coordination:    Normal    Gait:    Slow, cautious, narrow based, imbalance  Motor Observation:    No asymmetry, no atrophy, and no involuntary movements noted. Tone:    Normal muscle tone.    Posture:    Slightly stooped    Strength: difficult motor exam, generalized weakness, more pronounced on the right     Sensation: intact to LT     Reflex Exam:  DTR's:    Deep tendon reflexes in the upper and lower extremities are symmetrical bilaterally.   Toes:    The toes are equiv bilaterally.   Clonus:    Clonus is absent.     Assessment/Plan:  81 y.o. female here as requested by Ernestene Kiel, MD for headaches. Pmhx diabetes, headaches, COPD, statin myopathy, thyroid disease, stroke, migraines, hypothyroidism, hyperlipidemia, arthritis, anxiety, sinus problems, renal artery stenosis, hyponatremia, labile blood pressure, pontine cerebrovascular accident, oropharyngeal dysphagia, hypertension.  Chronic daily severe worsening headache despite treatment, acute onset, unrelenting.  In the beginning she perseverated on new medication causing the headaches, since then her medications have been changed by Dr. Laqueta Due multiple times without relief. We have tried her on multiple medications and today trying botox samples bc her headaches ongoing without relief > 3 months. She needs an MRI and MRV as below and if negative  an LP.  Patient has had unrelenting headaches since November.  It was acute and severe in onset, thunderclap.  At that time at the end of 2022 she had an unremarkable CT scan of the head however the headaches have not relented.  They are daily severe, they are nocturnal and morning, they are worse positionally, she is having vision changes, she has focal motor symptoms.    - considering all her concerning symptoms despite CT scan being unremarkable I think at this time she needs an MRI  of the brain which is much more sensitive than a CT scan of the head, we need to evaluate for any malignancies, strokes, demyelination, vasculitides, space-occupying masses, or anything else that could cause headaches.  She also needs an MRV to rule out venous thrombosis given her focal motor right-sided weakness today. States she needs sedation cannot be in MRI even with benzos.  - If MRI/MRV negative consider a lumbar puncture   - In order to avoid any more questions to medication side effects as she perseverates on this being the cause, we have treated her with the most recent headache/migraine medications including the CGRP injections, Ubrelvy and Nurtec(they have no significant side effects), and today I am using samples of Botox to see if that will help.  At this time we have tried many modalities, If no other cause found with MRI/MRV/LP will continue to try migraine/headache medications and will keep providing botox samples until something helps. We can keep her on CGRP medications and also try oral meds like nortriptyline or topamax or other common headache/migraine medications  - Needs eye exam, asked her schedule last appointments she has not, encouraged her today and asked her friend to help engage that - Have checked esr/crp in the past for GCA but will check again  Orders Placed This Encounter  Procedures   MR BRAIN W WO CONTRAST   MR MRV HEAD WO CM   Sedimentation rate   C-reactive protein   Basic Metabolic Panel   CBC with Differential/Platelets    Discussed: To prevent or relieve headaches, try the following: Cool Compress. Lie down and place a cool compress on your head.  Avoid headache triggers. If certain foods or odors seem to have triggered your migraines in the past, avoid them. A headache diary might help you identify triggers.  Include physical activity in your daily routine. Try a daily walk or other moderate aerobic exercise.  Manage stress. Find healthy ways to cope  with the stressors, such as delegating tasks on your to-do list.  Practice relaxation techniques. Try deep breathing, yoga, massage and visualization.  Eat regularly. Eating regularly scheduled meals and maintaining a healthy diet might help prevent headaches. Also, drink plenty of fluids.  Follow a regular sleep schedule. Sleep deprivation might contribute to headaches Consider biofeedback. With this mind-body technique, you learn to control certain bodily functions -- such as muscle tension, heart rate and blood pressure -- to prevent headaches or reduce headache pain.    Proceed to emergency room if you experience new or worsening symptoms or symptoms do not resolve, if you have new neurologic symptoms or if headache is severe, or for any concerning symptom.   Provided education and documentation from [employer] headache Society toolbox including articles on: chronic migraine medication overuse headache, chronic migraines, prevention of migraines, behavioral and other nonpharmacologic treatments for headache.    Cc: Ernestene Kiel, MD,  Ernestene Kiel, MD  Sarina Ill, MD  I spent over 45 minutes  of face-to-face and non-face-to-face time with patient on the  1. Chronic intractable headache, unspecified headache type   2. Chronic daily headache   3. Intracranial and intraspinal phlebitis and thrombophlebitis   4. Worsening headaches   5. Positional headache   6. Vision changes   7. Focal motor deficit   8. Right sided weakness   9. Nocturnal headaches   10. Morning headache   11. Thunderclap headache    diagnosis.  This included previsit chart review, lab review, study review, order entry, electronic health record documentation, patient education on the different diagnostic and therapeutic options, counseling and coordination of care, risks and benefits of management, compliance, or risk factor reduction   [facility] [address] [place], [place] [address]  Phone [phone] Fax [phone]

## 2021-04-09 NOTE — Progress Notes (Signed)
Botox- 200 units x 1 vial Lot: [id number] Expiration: 01/2023 NDC: [id number]  Bacteriostatic 0.9% Sodium Chloride- 72mL total Lot: [id number] Expiration: 09/16/2022 NDC: [id number]  Dx: [id number] B/B

## 2021-04-10 LAB — CBC WITH DIFFERENTIAL/PLATELET
Basophils Absolute: 0.1 10*3/uL (ref 0.0–0.2)
Basos: 1 %
EOS (ABSOLUTE): 0.1 10*3/uL (ref 0.0–0.4)
Eos: 1 %
Hematocrit: 44.7 % (ref 34.0–46.6)
Hemoglobin: 14.9 g/dL (ref 11.1–15.9)
Immature Grans (Abs): 0 10*3/uL (ref 0.0–0.1)
Immature Granulocytes: 1 %
Lymphocytes Absolute: 2.6 10*3/uL (ref 0.7–3.1)
Lymphs: 30 %
MCH: 29.2 pg (ref 26.6–33.0)
MCHC: 33.3 g/dL (ref 31.5–35.7)
MCV: 88 fL (ref 79–97)
Monocytes Absolute: 1.1 10*3/uL — ABNORMAL HIGH (ref 0.1–0.9)
Monocytes: 13 %
Neutrophils Absolute: 4.6 10*3/uL (ref 1.4–7.0)
Neutrophils: 54 %
Platelets: 258 10*3/uL (ref 150–450)
RBC: 5.1 x10E6/uL (ref 3.77–5.28)
RDW: 12.7 % (ref 11.7–15.4)
WBC: 8.4 10*3/uL (ref 3.4–10.8)

## 2021-04-10 LAB — C-REACTIVE PROTEIN: CRP: 2 mg/L (ref 0–10)

## 2021-04-10 LAB — BASIC METABOLIC PANEL
BUN/Creatinine Ratio: 32 — ABNORMAL HIGH (ref 12–28)
BUN: 21 mg/dL (ref 8–27)
CO2: 26 mmol/L (ref 20–29)
Calcium: 10.2 mg/dL (ref 8.7–10.3)
Chloride: 100 mmol/L (ref 96–106)
Creatinine, Ser: 0.66 mg/dL (ref 0.57–1.00)
Glucose: 154 mg/dL — ABNORMAL HIGH (ref 70–99)
Potassium: 3.9 mmol/L (ref 3.5–5.2)
Sodium: 139 mmol/L (ref 134–144)
eGFR: 89 mL/min/{1.73_m2} (ref >59)

## 2021-04-10 LAB — SEDIMENTATION RATE: Sed Rate: 8 mm/hr (ref 0–40)

## 2021-04-11 ENCOUNTER — Encounter: Payer: Self-pay | Admitting: Neurology

## 2021-04-12 ENCOUNTER — Encounter: Payer: Self-pay | Admitting: Sports Medicine

## 2021-04-13 ENCOUNTER — Telehealth: Payer: Medicare Other | Admitting: Neurology

## 2021-04-13 ENCOUNTER — Telehealth: Payer: Self-pay | Admitting: Neurology

## 2021-04-13 ENCOUNTER — Encounter: Payer: Self-pay | Admitting: Neurology

## 2021-04-13 NOTE — Telephone Encounter (Signed)
sedated  Medicare/BCBS fed no auth req order sent to [facility] to be scheduled- they will reach out to the patient to schedule.   Also, put the H&P form in box to be filled out.

## 2021-04-14 ENCOUNTER — Ambulatory Visit: Payer: Medicare Other | Admitting: Neurology

## 2021-04-14 ENCOUNTER — Encounter: Payer: Self-pay | Admitting: Neurology

## 2021-04-14 ENCOUNTER — Telehealth: Payer: Self-pay | Admitting: Neurology

## 2021-04-14 MED ORDER — ONDANSETRON 4 MG PO TBDP: 4.0000 mg | ORAL_TABLET | Freq: Three times a day (TID) | ORAL | 3 refills | Status: DC | PRN

## 2021-04-14 NOTE — Telephone Encounter (Signed)
Spoke to patient, she is worried about Dr. [name] and I told patient I cannot get involved between her and Dr. [name] and I agree with Dr. [name] that patient's medications are not the cause of her headaches. We have MRI/MRV pending. Patient was in no acute distress, she says the botox is helping. Great news. She is dizzy, she is taking dramamine I will give her ondansetron instead.

## 2021-04-15 ENCOUNTER — Telehealth: Payer: Self-pay | Admitting: Cardiology

## 2021-04-15 NOTE — Telephone Encounter (Signed)
Called and spoke w/pt stated that I attempted to redo the copay card but they needed to speak w/her directly and the pt voiced understanding. ? ?We just need a little more information from you. ?Call RepathaReady? at [phone]) to see if you qualify for the Repatha? Copay Card. ?

## 2021-04-15 NOTE — Telephone Encounter (Signed)
Patient wanted to know how she could get a new discount card for her Repatha. She said her current card is expired and she can not afford the $100 copay. ? ?Please advise  ?

## 2021-04-20 ENCOUNTER — Encounter: Payer: Self-pay | Admitting: Neurology

## 2021-04-22 ENCOUNTER — Ambulatory Visit: Payer: Medicare Other | Admitting: Sports Medicine

## 2021-04-22 ENCOUNTER — Encounter: Payer: Self-pay | Admitting: *Deleted

## 2021-04-22 DIAGNOSIS — R519 Headache, unspecified: Secondary | ICD-10-CM

## 2021-04-22 DIAGNOSIS — G08 Intracranial and intraspinal phlebitis and thrombophlebitis: Secondary | ICD-10-CM

## 2021-04-22 DIAGNOSIS — G8929 Other chronic pain: Secondary | ICD-10-CM

## 2021-04-22 DIAGNOSIS — H539 Unspecified visual disturbance: Secondary | ICD-10-CM

## 2021-04-22 DIAGNOSIS — R29898 Other symptoms and signs involving the musculoskeletal system: Secondary | ICD-10-CM

## 2021-04-22 DIAGNOSIS — R51 Headache with orthostatic component, not elsewhere classified: Secondary | ICD-10-CM

## 2021-04-22 DIAGNOSIS — R531 Weakness: Secondary | ICD-10-CM

## 2021-04-22 DIAGNOSIS — G4453 Primary thunderclap headache: Secondary | ICD-10-CM

## 2021-04-22 NOTE — Telephone Encounter (Signed)
I called the patient and spoke directly with her.  She will take a look online at the type of open MRI machine that is available at [facility].  She is possibly interested in going this route with some Valium rather than an MRI under anesthesia at [facility].  At this time we will not cancel or schedule any appointments.  She will take a look at the MRI pictures and then get back to [place] about what she would like to do.  Her questions were answered to the best of my ability and she was very Adult nurse.  Patient voiced her concerns of being anxious and I provided emotional support. ?

## 2021-04-23 NOTE — Telephone Encounter (Signed)
Spoke with Brandy Flowers and scheduled her with [name] NP for a visit for physical assessment next Monday 3/13 @ 1030 arrival 15-30 minutes early.  ?

## 2021-04-23 NOTE — Progress Notes (Signed)
Chief Complaint  Patient presents with   Follow-up    Rm 1 with best friend, Posey Pronto. Needed assessment prior to MRI. In wheelchair in office today. Able to stand to obtain weight.      HISTORY OF PRESENT ILLNESS:  04/27/21 ALL:  Brandy Flowers is a 81 y.o. female here today for follow up for evaluation for MRI under anesthesia. She is seeing Dr Jaynee Eagles for chronic migraines. She was unable to complete MRI with valium and scheduled for anesthesia at the end of this month. Per last visit 04/09/2021, patient having daily, severe headaches. Acute onset 12/2020. She is concerned they are due to medication changes with PCP. She has tried and failed Emgality, [place]. Botox was administered 04/09/2021. She does not feel it has helped at all. She is scheduled for repeat procedure 07/07/2021. She continues to have daily headaches. She has an appt with new PCP in [place], [place], NP. BP is elevated, today. 161/80 and repeat manual reading 160/80. She continues losartan 50mg  and atenolol 50mg  BID. She continues lorazepam 1mg  TID.    02/12/2021 AA: Patient perseverates on medications causing her headaches.  I have had contact with Dr. Laqueta Due who does not believe that her medications are playing a role.  We tried [place] for her as she just wanted to start something acutely and not preventatively.  She continued to have migrainous headaches.  We got [place] approved. We got emgality approved. She is getting her first injections with training today. She reports unilateral migraines worsening > 3 months, pulsating/pounding/throbbing with nausea and light sensitivity, lasting 4-24 hours, movement makes it worse. She is on Chief Executive Officer and ubrelvy as needed.  Just here for a nurse viti to inject.   HPI:  Brandy Flowers is a 81 y.o. female here as requested by Ernestene Kiel, MD for headaches. Pmhx diabetes, headaches, COPD, statin myopathy, thyroid disease, stroke, migraines,  hypothyroidism, hyperlipidemia, arthritis, anxiety, sinus problems, renal artery stenosis, hyponatremia, labile blood pressure, pontine cerebrovascular accident, oropharyngeal dysphagia, hypertension.  I reviewed Dr. Noe Gens notes: Patient sees a chiropractor, she has neck problems with dizziness and headaches.  I reviewed Epic notes, Patient was seen by Dr. Jannifer Franklin here in the office in December 2020 after stroke in January 03, 2018, she presented with generalized weakness and slurred speech that was acute in onset, at the time she was walking with a cane, she returned to the hospital on December 26, 2018 with vertigo that was positional in nature, MRI of the brain done at that time showed no acute changes, she started having headaches sometime in late summer 2020, but at the time she saw Dr. Jannifer Franklin on February 08, 2019 she states that the headaches had resolved the last 2 months after stopping a medication with a diet and at that she thinks she was allergic to or sensitive to.  She also complained of some low back pain and some occasional right leg pain that comes and goes, history of restless leg syndrome, she has not been able to tolerate statins in the past.   Headaches are In the temples and feels like her whole head is pressure, pulsating, nausea, not much light sensitivity. Codeine helps but makes her dizzy. Headaches started after the olmesartan and inspra. Not pulsating/pounding. Not photophobia or phonophobia, some mild nausea. Dramamine helps with the nausea but not dizziness. Sometimes unilateral, she is having headaches daily. Sometimes she wakes up with them. She is not excessively fatigued during the day, may feel  a little weak. When she gets headaches she check blood pressure. Stopped taking the butalbital. She is taking a codeine. Laying down helps. Dizziness is from the codeine, no vision changes. Ongoing over a year, worsening, more frequent, some days they can be severe but other days mild,  usually doesn't wake up in the morning with them but develops during the day, between 4-14 total headache days a month     Reviewed notes, labs and imaging from outside physicians, which showed:   Recent blood work(from referrng provider notes) includes normal TSH and T4, LDL 64, CBC normal, CMP with elevated glucose 128 otherwise normal, hemoglobin A1c 6.3.   From a thorough review of records, medications tried that can be used in migraine management include aspirin, meclizine, Fioricet, atenolol, gabapentin, Tylenol, amlodipine, aspirin, atenolol, Lexapro, losartan, meclizine, Mobic, Zofran, prednisone, Compazine injections, scopolamine patches, tizanidine, topiramate, amitriptyline   MRI brain 12/2018: FINDINGS: personally reviewed images and agree Brain: Negative for acute infarct. 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 No significant white matter ischemia. Negative for hemorrhage or mass. Mild atrophy without hydrocephalus.   Vascular: Normal arterial flow voids   Skull and upper cervical spine: Negative   Sinuses/Orbits: Mild mucosal edema paranasal sinuses. Bilateral cataract surgery   Other: None   IMPRESSION: No acute abnormality   Atrophy and mild chronic ischemic change including the pons.   REVIEW OF SYSTEMS: Out of a complete 14 system review of symptoms, the patient complains only of the following symptoms, headaches, and all other reviewed systems are negative.   ALLERGIES: Allergies  Allergen Reactions   Amiloride Shortness Of Breath   Blue Dyes (Parenteral) Shortness Of Breath, Rash and Other (See Comments)    Joint pain   Inderal La [Propranolol Hcl] Shortness Of Breath   Sulfa Antibiotics Anaphylaxis   Ace Inhibitors Nausea And Vomiting   Norvasc [Amlodipine] Nausea And Vomiting   Calcium Channel Blockers Diarrhea   Catapres [Clonidine Hcl]     Decreased mental clarity    Clonidine Derivatives Other (See Comments)    Headache, flu like symptoms   Diovan  [Valsartan] Cough   Hydralazine Other (See Comments)    Headache   Metformin And Related Diarrhea   Micardis [Telmisartan] Other (See Comments)    Hallucinations, increase in BP   Minoxidil Other (See Comments)    Fast heart rate, chest pain   Plavix [Clopidogrel]     Increased LFT's    Serotonin Reuptake Inhibitors (Ssris) Other (See Comments)    Headache   Sulfur    Topamax [Topiramate] Other (See Comments)    Confusion   Tramadol     Nausea as a dog per pt   Tylenol With Codeine #3 [Acetaminophen-Codeine] Other (See Comments)    Lightheaded   Zetia [Ezetimibe]     Liver  Function increase labs   Amoxicillin Diarrhea   Chlorthalidone     Dizziness.    Doxycycline Diarrhea   Fosamax [Alendronate Sodium]     Pt's teeth fell out while taking.    Lexapro [Escitalopram]     Brain Fog    Spironolactone Palpitations   Statins     Upset stomach      HOME MEDICATIONS: Outpatient Medications Prior to Visit  Medication Sig Dispense Refill   ACETAMINOPHEN-BUTALBITAL 50-325 MG TABS Take 1 tablet by mouth 3 (three) times daily as needed.     Acetaminophen-Codeine 300-30 MG tablet Take by mouth.     atenolol (TENORMIN) 50 MG tablet Take 50 mg by  mouth 2 (two) times daily.     Continuous Blood Gluc Receiver (FREESTYLE LIBRE 2 READER) DEVI      Continuous Blood Gluc Sensor (FREESTYLE LIBRE 2 SENSOR) MISC      diclofenac (FLECTOR) 1.3 % PTCH Place 1 patch onto the skin daily as needed.      Evolocumab (REPATHA SURECLICK) [id number] MG/ML SOAJ Inject 140 mg into the skin every 14 (fourteen) days. 2 mL 11   lidocaine (LIDODERM) 5 % APPLY 1 PATCH TO INTACT SKIN REMOVE AFTER 12 HOURS ONCE A DAY TO NECK AS NEEDED     LORazepam (ATIVAN) 1 MG tablet Take 1 mg by mouth 3 (three) times daily as needed.     losartan (COZAAR) 50 MG tablet Take 50 mg by mouth 2 (two) times daily.     SYNTHROID 50 MCG tablet Take 100 mcg by mouth daily before breakfast.      Galcanezumab-gnlm (EMGALITY) 120 MG/ML SOAJ  Inject 120 mg into the skin every 30 (thirty) days. 1.12 mL 11   meclizine (ANTIVERT) 25 MG tablet Take 25 mg by mouth 2 (two) times daily as needed for dizziness.     ondansetron (ZOFRAN-ODT) 4 MG disintegrating tablet Take 1 tablet (4 mg total) by mouth every 8 (eight) hours as needed for nausea. 30 tablet 3   No facility-administered medications prior to visit.     PAST MEDICAL HISTORY: Past Medical History:  Diagnosis Date   Anxiety    Arthritis    COPD with exacerbation ([place])    Diabetes mellitus without complication (HCC)    Diastolic dysfunction    Grade 1   Hyperlipidemia    Hypertension    Hypothyroidism    Migraines    Renal artery stenosis (HCC)    Left 50%   Sigmoid diverticulosis    Sinus problem    Splenic artery aneurysm (HCC)    70mm   Stroke ([place])    Thyroid disease      PAST SURGICAL HISTORY: Past Surgical History:  Procedure Laterality Date   BREAST REDUCTION SURGERY  2007   CATARACT EXTRACTION Bilateral    CHOLECYSTECTOMY       FAMILY HISTORY: Family History  Problem Relation Age of Onset   Stroke Mother    Diabetes Mother    Heart disease Mother        Before age 39   Hypertension Mother    Hyperlipidemia Mother    Heart disease Father    Heart attack Father    Hypertension Father    Heart disease Brother        After age 1- CABG   Hypertension Brother    Migraines Neg Hx      SOCIAL HISTORY: Social History   Socioeconomic History   Marital status: Widowed    Spouse name: Not on file   Number of children: Not on file   Years of education: Not on file   Highest education level: Not on file  Occupational History   Not on file  Tobacco Use   Smoking status: Never   Smokeless tobacco: Never  Vaping Use   Vaping Use: Never used  Substance and Sexual Activity   Alcohol use: No   Drug use: No   Sexual activity: Not Currently  Other Topics Concern   Not on file  Social History Narrative   Lives at home with her dog     Right handed   Caffeine: maybe 1 cup/day   Social Determinants of Health  Financial Resource Strain: Not on file  Food Insecurity: Not on file  Transportation Needs: Not on file  Physical Activity: Not on file  Stress: Not on file  Social Connections: Not on file  Intimate Partner Violence: Not on file     PHYSICAL EXAM  Vitals:   04/27/21 1018  BP: (!) 161/80  Pulse: 69  SpO2: 98%  Weight: 109 lb (49.4 kg)  Height: 5\' 1"  (1.549 m)   Body mass index is 20.6 kg/m.  Generalized: Well developed, in no acute distress  Cardiology: normal rate and rhythm, no murmur auscultated  Respiratory: clear to auscultation bilaterally    Neurological examination  Mentation: Alert oriented to time, place, history taking. Follows all commands speech and language fluent Cranial nerve II-XII: Pupils were equal round reactive to light. Extraocular movements were full, visual field were full on confrontational test. Facial sensation and strength were normal. Head turning and shoulder shrug  were normal and symmetric. Motor: The motor testing reveals 5 over 5 strength of all 4 extremities. Good symmetric motor tone is noted throughout.  Sensory: Sensory testing is intact to soft touch on all 4 extremities. No evidence of extinction is noted.  Coordination: Cerebellar testing reveals good finger-nose-finger and heel-to-shin bilaterally.  Gait and station: Gait was not assessed today as patient does not feel she can walk safely due to headache. In wheelchair.    DIAGNOSTIC DATA (LABS, IMAGING, TESTING) - I reviewed patient records, labs, notes, testing and imaging myself where available.  Lab Results  Component Value Date   WBC 8.4 04/09/2021   HGB 14.9 04/09/2021   HCT 44.7 04/09/2021   MCV 88 04/09/2021   PLT 258 04/09/2021      Component Value Date/Time   NA 139 04/09/2021 1156   K 3.9 04/09/2021 1156   CL 100 04/09/2021 1156   CO2 26 04/09/2021 1156   GLUCOSE 154 (H) 04/09/2021  1156   GLUCOSE 95 12/26/2018 1420   BUN 21 04/09/2021 1156   CREATININE 0.66 04/09/2021 1156   CALCIUM 10.2 04/09/2021 1156   PROT 6.9 12/26/2018 1552   ALBUMIN 3.8 12/26/2018 1552   AST 24 12/26/2018 1552   ALT 22 12/26/2018 1552   ALKPHOS 60 12/26/2018 1552   BILITOT 1.0 12/26/2018 1552   GFRNONAA >60 12/26/2018 1420   GFRAA >60 12/26/2018 1420   Lab Results  Component Value Date   CHOL 205 (H) 12/18/2017   HDL 41 12/18/2017   LDLCALC 139 (H) 12/18/2017   TRIG 124 12/18/2017   CHOLHDL 5.0 12/18/2017   Lab Results  Component Value Date   HGBA1C 6.5 (H) 12/18/2017   No results found for: VITAMINB12 No results found for: TSH  No flowsheet data found.   No flowsheet data found.   ASSESSMENT AND PLAN  81 y.o. year old female  has a past medical history of Anxiety, Arthritis, COPD with exacerbation ([place]), Diabetes mellitus without complication ([place]), Diastolic dysfunction, Hyperlipidemia, Hypertension, Hypothyroidism, Migraines, Renal artery stenosis ([place]), Sigmoid diverticulosis, Sinus problem, Splenic artery aneurysm ([place]), Stroke ([place]), and Thyroid disease. here with    Chronic intractable headache, unspecified headache type  Brandy Flowers is scheduled for MRI/MRA under anesthesia with [facility] 05/14/2021. Patient reports being unable to tolerate with oral antianxiety medication. We have reviewed benefit versus potential risks and she requests to proceed with MRI/MRA imaging under general anesthesia. Barring unforseen circumstances, I expect that she should do well. She will discuss potential risks and benefits with anesthesia at [facility] as well.  She will follow up with Dr Jaynee Eagles for results and change in treatment plan as indicated.   No orders of the defined types were placed in this encounter.    No orders of the defined types were placed in this encounter.     Debbora Presto, MSN, FNP-C 04/27/2021, 4:03 PM  [facility] [address], [place], [place] [address] [phone]

## 2021-04-23 NOTE — Addendum Note (Signed)
Addended by: Bertram Savin on: 04/23/2021 09:00 AM ? ? Modules accepted: Orders ? ?

## 2021-04-23 NOTE — Patient Instructions (Signed)
Below is our plan: ? ?We will continue Botox as planned with Dr Lucia Gaskins. Next procedure on 07/07/2021. MRI is scheduled with [facility] 05/14/2021.  ? ?Please make sure you are staying well hydrated. I recommend 50-60 ounces daily. Well balanced diet and regular exercise encouraged. Consistent sleep schedule with 6-8 hours recommended.  ? ?Please continue follow up with care team as directed.  ? ?Follow up with Dr Lucia Gaskins as directed.  ? ?You may receive a survey regarding today's visit. I encourage you to leave honest feed back as I do use this information to improve patient care. Thank you for seeing me today!  ? ? ?

## 2021-04-24 ENCOUNTER — Ambulatory Visit: Payer: Medicare Other | Admitting: Sports Medicine

## 2021-04-27 ENCOUNTER — Ambulatory Visit (INDEPENDENT_AMBULATORY_CARE_PROVIDER_SITE_OTHER): Payer: Medicare Other | Admitting: Family Medicine

## 2021-04-27 ENCOUNTER — Encounter: Payer: Self-pay | Admitting: Family Medicine

## 2021-04-27 VITALS — BP 161/80 | HR 69 | Ht 61.0 in | Wt 109.0 lb

## 2021-04-27 DIAGNOSIS — R519 Headache, unspecified: Secondary | ICD-10-CM | POA: Diagnosis not present

## 2021-04-27 DIAGNOSIS — G8929 Other chronic pain: Secondary | ICD-10-CM | POA: Diagnosis not present

## 2021-04-27 NOTE — Telephone Encounter (Addendum)
Received fax from the [facility]  [facility]) dated 04-27-2021 and PAE [phone]. She meets the program eliigibility requirements and is enrolled in the [facility] for a 12 month period. [phone], fax [phone].   ?

## 2021-04-28 ENCOUNTER — Other Ambulatory Visit: Payer: Self-pay

## 2021-04-28 ENCOUNTER — Encounter: Payer: Self-pay | Admitting: Sports Medicine

## 2021-04-28 ENCOUNTER — Ambulatory Visit (INDEPENDENT_AMBULATORY_CARE_PROVIDER_SITE_OTHER): Payer: Medicare Other | Admitting: Sports Medicine

## 2021-04-28 DIAGNOSIS — Z7901 Long term (current) use of anticoagulants: Secondary | ICD-10-CM

## 2021-04-28 DIAGNOSIS — E1140 Type 2 diabetes mellitus with diabetic neuropathy, unspecified: Secondary | ICD-10-CM

## 2021-04-28 DIAGNOSIS — M79676 Pain in unspecified toe(s): Secondary | ICD-10-CM | POA: Diagnosis not present

## 2021-04-28 DIAGNOSIS — Z8673 Personal history of transient ischemic attack (TIA), and cerebral infarction without residual deficits: Secondary | ICD-10-CM

## 2021-04-28 DIAGNOSIS — B351 Tinea unguium: Secondary | ICD-10-CM | POA: Diagnosis not present

## 2021-04-28 NOTE — Progress Notes (Signed)
Subjective: ?Brandy Flowers is a 81 y.o. female patient with history of diabetes, still not on any medications last A1c 8 and last visit to Dr. Laqueta Due was in December and states that she will now be seeing Dr. Heide Scales this Thursday.  Patient reports that she is very nervous about her MRI she is still been having a lot of headaches that have not gotten better. ? ?Blood sugar this morning 132 ?A1c 8 ? ?Patient Active Problem List  ? Diagnosis Date Noted  ? Chronic intractable headache 04/09/2021  ? New onset headache 04/06/2021  ? Chronic daily headache 04/06/2021  ? Migraine without aura and without status migrainosus, not intractable 01/12/2021  ? COPD with exacerbation ([place])   ? 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  ? Statin not tolerated 10/08/2019  ? Thyroid disease   ? Stroke [facility])   ? Sinus problem   ? Sigmoid diverticulosis   ? Migraines   ? Hypothyroidism   ? Hyperlipidemia   ? Diabetes mellitus without complication ([place])   ? Arthritis   ? Anxiety   ? Renal artery stenosis ([place])   ? Hyponatremia   ? Labile blood pressure   ? 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 Oropharyngeal dysphagia   ? Diastolic dysfunction   ? Dysphagia, post-stroke   ? Hypokalemia   ? Stroke (cerebrum) ([place]) 12/17/2017  ? Anxiety state 12/17/2017  ? 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 Hypertension 09/20/2013  ? Hypothyroid 09/20/2013  ? ?Current Outpatient Medications on File Prior to Visit  ?Medication Sig Dispense Refill  ? ACETAMINOPHEN-BUTALBITAL 50-325 MG TABS Take 1 tablet by mouth 3 (three) times daily as needed.    ? Acetaminophen-Codeine 300-30 MG tablet Take by mouth.    ? atenolol (TENORMIN) 50 MG tablet Take 50 mg by mouth 2 (two) times daily.    ? Continuous Blood Gluc Receiver (FREESTYLE LIBRE 2 READER) DEVI     ? Continuous Blood Gluc Sensor (FREESTYLE LIBRE 2 SENSOR) MISC     ?  diclofenac (FLECTOR) 1.3 % PTCH Place 1 patch onto the skin daily as needed.     ? Evolocumab (REPATHA SURECLICK) 673 MG/ML SOAJ Inject 140 mg into the skin every 14 (fourteen) days. 2 mL 11  ? lidocaine (LIDODERM) 5 % APPLY 1 PATCH TO INTACT SKIN REMOVE AFTER 12 HOURS ONCE A DAY TO NECK AS NEEDED    ? LORazepam (ATIVAN) 1 MG tablet Take 1 mg by mouth 3 (three) times daily as needed.    ? losartan (COZAAR) 50 MG tablet Take 50 mg by mouth 2 (two) times daily.    ? SYNTHROID 50 MCG tablet Take 100 mcg by mouth daily before breakfast.     ? UBRELVY 100 MG TABS Take by mouth.    ? ?No current facility-administered medications on file prior to visit.  ? ?Allergies  ?Allergen Reactions  ? Amiloride Shortness Of Breath  ? Blue Dyes (Parenteral) Shortness Of Breath, Rash and Other (See Comments)  ?  Joint pain  ? Inderal La [Propranolol Hcl] Shortness Of Breath  ? Sulfa Antibiotics Anaphylaxis  ? Ace Inhibitors Nausea And Vomiting  ? Norvasc [Amlodipine] Nausea And Vomiting  ? Calcium Channel Blockers Diarrhea  ? Catapres [Clonidine Hcl]   ?  Decreased mental clarity   ? Clonidine Derivatives Other (See Comments)  ?  Headache, flu like symptoms  ? Diovan [Valsartan] Cough  ?  Hydralazine Other (See Comments)  ?  Headache  ? Metformin And Related Diarrhea  ? Micardis [Telmisartan] Other (See Comments)  ?  Hallucinations, increase in BP  ? Minoxidil Other (See Comments)  ?  Fast heart rate, chest pain  ? Plavix [Clopidogrel]   ?  Increased LFT's   ? Serotonin Reuptake Inhibitors (Ssris) Other (See Comments)  ?  Headache  ? Sulfur   ? Topamax [Topiramate] Other (See Comments)  ?  Confusion  ? Tramadol   ?  Nausea as a dog per pt  ? Tylenol With Codeine #3 [Acetaminophen-Codeine] Other (See Comments)  ?  Lightheaded  ? Zetia [Ezetimibe]   ?  Liver  Function increase labs  ? Amoxicillin Diarrhea  ? Chlorthalidone   ?  Dizziness.   ? Doxycycline Diarrhea  ? Fosamax [Alendronate Sodium]   ?  Pt's teeth fell out while taking.   ?  Lexapro [Escitalopram]   ?  Brain Fog   ? Spironolactone Palpitations  ? Statins   ?  Upset stomach   ? ? ?Recent Results (from the past 2160 hour(s))  ?Sedimentation rate     Status: None  ? Collection Time: 04/09/21 11:56 AM  ?Result Value Ref Range  ? Sed Rate 8 0 - 40 mm/hr  ?C-reactive protein     Status: None  ? Collection Time: 04/09/21 11:56 AM  ?Result Value Ref Range  ? CRP 2 0 - 10 mg/L  ?Basic Metabolic Panel     Status: Abnormal  ? Collection Time: 04/09/21 11:56 AM  ?Result Value Ref Range  ? Glucose 154 (H) 70 - 99 mg/dL  ? BUN 21 8 - 27 mg/dL  ? Creatinine, Ser 0.66 0.57 - 1.00 mg/dL  ? eGFR 89 >59 mL/min/1.73  ? BUN/Creatinine Ratio 32 (H) 12 - 28  ? Sodium 139 134 - 144 mmol/L  ? Potassium 3.9 3.5 - 5.2 mmol/L  ? Chloride 100 96 - 106 mmol/L  ? CO2 26 20 - 29 mmol/L  ? Calcium 10.2 8.7 - 10.3 mg/dL  ?CBC with Differential/Platelets     Status: Abnormal  ? Collection Time: 04/09/21 11:56 AM  ?Result Value Ref Range  ? WBC 8.4 3.4 - 10.8 x10E3/uL  ? RBC 5.10 3.77 - 5.28 x10E6/uL  ? Hemoglobin 14.9 11.1 - 15.9 g/dL  ? Hematocrit 44.7 34.0 - 46.6 %  ? MCV 88 79 - 97 fL  ? MCH 29.2 26.6 - 33.0 pg  ? MCHC 33.3 31.5 - 35.7 g/dL  ? RDW 12.7 11.7 - 15.4 %  ? Platelets 258 150 - 450 x10E3/uL  ? Neutrophils 54 Not Estab. %  ? Lymphs 30 Not Estab. %  ? Monocytes 13 Not Estab. %  ? Eos 1 Not Estab. %  ? Basos 1 Not Estab. %  ? Neutrophils Absolute 4.6 1.4 - 7.0 x10E3/uL  ? Lymphocytes Absolute 2.6 0.7 - 3.1 x10E3/uL  ? Monocytes Absolute 1.1 (H) 0.1 - 0.9 x10E3/uL  ? EOS (ABSOLUTE) 0.1 0.0 - 0.4 x10E3/uL  ? Basophils Absolute 0.1 0.0 - 0.2 x10E3/uL  ? Immature Granulocytes 1 Not Estab. %  ? Immature Grans (Abs) 0.0 0.0 - 0.1 x10E3/uL  ? ? ?Objective: ?General: Patient is awake, alert and in no acute distress.  Using rolling walker. ? ?Integument: Skin is warm, dry and supple bilateral. Nails are tender, long, thickened and dystrophic with subungual debris, consistent with onychomycosis, 1-5 bilateral. No signs  of infection. No open lesions or preulcerative lesions present bilateral.  Small pinch callus at distal tuft of left 4th toe and minimal reactive keratosis plantar left forefoot with no signs of infection like previous.  Remaining integument unremarkable. ? ?Vasculature:  Dorsalis Pedis pulse 1/4 bilateral. Posterior Tibial pulse  1/4 bilateral. Capillary fill time <3 sec 1-5 bilateral. Positive hair growth to the level of the digits.Temperature gradient within normal limits. No varicosities present bilateral. No edema present bilateral.  ? ?Neurology: Gross sensation present via light touch bilateral. ? 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  Strength acceptable for patient status.  No tenderness with calf compression bilateral. ? ?Assessment and Plan: ?Problem List Items Addressed This Visit   ? ?  ? Endocrine  ? Type 2 diabetes, controlled, with neuropathy ([place])  ? ?Other Visit Diagnoses   ? ? Pain due to onychomycosis of toenail    -  Primary  ? Anticoagulant long-term use      ? History of stroke      ? ?  ? ?-Examined patient. ?-Discussed the importance of daily foot inspection and daily skin emollients in the setting of diabetes ?-Mechanically debrided all nails 1-5 bilateral using sterile nail nipper and filed with dremel without incident   ?-Smoothed callus using bur at tip of left 4th toe and ball of left foot without incident  ?-Return in 3 months for at risk foot care or sooner problems or issues arise ? ?Landis Martins, DPM ?

## 2021-05-05 ENCOUNTER — Encounter: Payer: Self-pay | Admitting: Neurology

## 2021-05-13 ENCOUNTER — Other Ambulatory Visit: Payer: Self-pay

## 2021-05-13 ENCOUNTER — Encounter (HOSPITAL_COMMUNITY): Payer: Self-pay | Admitting: *Deleted

## 2021-05-13 NOTE — Anesthesia Preprocedure Evaluation (Addendum)
Anesthesia Evaluation  ?Patient identified by MRN, date of birth, ID band ?Patient awake ? ? ? ?Reviewed: ?Allergy & Precautions, NPO status , Patient's Chart, lab work & pertinent test results ? ?History of Anesthesia Complications ?Negative for: history of anesthetic complications ? ?Airway ?Mallampati: II ? ?TM Distance: >3 FB ?Neck ROM: Full ? ? ? Dental ? ?(+) Dental Advisory Given, Missing ?  ?Pulmonary ?COPD,  ?  ?Pulmonary exam normal ? ? ? ? ? ? ? Cardiovascular ?hypertension, Pt. on medications ?Normal cardiovascular exam ? ? ?Echo 04/08/21: EF 60-65%, no RWMA, mild LAE, mild AV sclerosis, mild AR, mod MR, mild TR ?  ?Neuro/Psych ? Headaches, Anxiety CVA   ? GI/Hepatic ?negative GI ROS, Neg liver ROS,   ?Endo/Other  ?diabetes, Type 2Hypothyroidism  ? Renal/GU ?negative Renal ROS  ? ?  ?Musculoskeletal ?negative musculoskeletal ROS ?(+)  ? Abdominal ?  ?Peds ? Hematology ?negative hematology ROS ?(+)   ?Anesthesia Other Findings ? ? Reproductive/Obstetrics ? ?  ? ? ? ? ? ? ? ? ? ? ? ? ? ?  ?  ? ? ? ? ? ? ?Anesthesia Physical ?Anesthesia Plan ? ?ASA: 3 ? ?Anesthesia Plan: MAC  ? ?Post-op Pain Management: Minimal or no pain anticipated  ? ?Induction: Intravenous ? ?PONV Risk Score and Plan: 2 and Treatment may vary due to age or medical condition and Midazolam ? ?Airway Management Planned: Natural Airway, Nasal Cannula and Simple Face Mask ? ?Additional Equipment: None ? ?Intra-op Plan:  ? ?Post-operative Plan:  ? ?Informed Consent: I have reviewed the patients History and Physical, chart, labs and discussed the procedure including the risks, benefits and alternatives for the proposed anesthesia with the patient or authorized representative who has indicated his/her understanding and acceptance.  ? ? ? ? ? ?Plan Discussed with:  ? ?Anesthesia Plan Comments:   ? ? ? ? ?Anesthesia Quick Evaluation ? ?

## 2021-05-13 NOTE — Progress Notes (Signed)
Two VISITORS ARE ALLOWED TO COME WITH YOU AND STAY IN THE WAITING ROOM ONLY DURING PRE OP AND PROCEDURE DAY OF SURGERY.  ? ?PCP - [facility] Starling Manns, NP ?Cardiologist - Dr Norman Herrlich ?Neurology - Dr Naomie Dean ? ?Chest x-ray - n/a ?EKG - DOS ?Stress Test - 10/10/15 ?ECHO - 04/08/21 ?Cardiac Cath - n/a ? ?ICD Pacemaker/Loop - n/a ? ?Sleep Study -  n/a ?CPAP - none ? ?Diabetes, Type 2, no meds, diet controlled. ?If your blood sugar is less than 70 mg/dL, you will need to treat for low blood sugar: ?Treat a low blood sugar (less than 70 mg/dL) with ? cup of clear juice (cranberry or apple), 4 glucose tablets, OR glucose gel. ?Recheck blood sugar in 15 minutes after treatment (to make sure it is greater than 70 mg/dL). If your blood sugar is not greater than 70 mg/dL on recheck, call [phone] for further instructions. ? ?ERAS: Clear liquids til 7 AM DOS. ? ?Anesthesia review: Yes ? ?STOP now taking any Aspirin (unless otherwise instructed by your surgeon), Aleve, Naproxen, Ibuprofen, Motrin, Advil, Goody's, BC's, all herbal medications, fish oil, and all vitamins.  ? ?Coronavirus Screening ?Covid test n/a Ambulatory Surgery  ?Do you have any of the following symptoms:  ?Cough yes/no: No ?Fever (>100.37F)  yes/no: No ?Runny nose yes/no: No ?Sore throat yes/no: No ?Difficulty breathing/shortness of breath  yes/no: No ? ?Have you traveled in the last 14 days and where? yes/no: No ? ?Patient verbalized understanding of instructions that were given via phone. ?

## 2021-05-14 ENCOUNTER — Encounter (HOSPITAL_COMMUNITY): Admission: AD | Disposition: A | Discharge status: Home / Self Care | Payer: Self-pay | Source: Ambulatory Visit

## 2021-05-14 ENCOUNTER — Encounter (HOSPITAL_COMMUNITY): Payer: Self-pay

## 2021-05-14 ENCOUNTER — Ambulatory Visit (HOSPITAL_COMMUNITY)
Admission: RE | Admit: 2021-05-14 | Discharge: 2021-05-14 | Disposition: A | Discharge status: Home / Self Care | Payer: Medicare Other | Source: Ambulatory Visit | Attending: Neurology | Admitting: Neurology

## 2021-05-14 ENCOUNTER — Ambulatory Visit (HOSPITAL_COMMUNITY)
Admission: AD | Admit: 2021-05-14 | Discharge: 2021-05-14 | Disposition: A | Discharge status: Home / Self Care | Payer: Medicare Other | Source: Ambulatory Visit | Attending: Radiology | Admitting: Radiology

## 2021-05-14 ENCOUNTER — Ambulatory Visit (HOSPITAL_BASED_OUTPATIENT_CLINIC_OR_DEPARTMENT_OTHER): Payer: Medicare Other | Admitting: Physician Assistant

## 2021-05-14 ENCOUNTER — Ambulatory Visit (HOSPITAL_COMMUNITY): Admission: RE | Admit: 2021-05-14 | Payer: Medicare Other | Source: Ambulatory Visit

## 2021-05-14 ENCOUNTER — Ambulatory Visit (HOSPITAL_COMMUNITY): Payer: Medicare Other | Admitting: Physician Assistant

## 2021-05-14 DIAGNOSIS — R9082 White matter disease, unspecified: Secondary | ICD-10-CM | POA: Insufficient documentation

## 2021-05-14 DIAGNOSIS — R531 Weakness: Secondary | ICD-10-CM

## 2021-05-14 DIAGNOSIS — I10 Essential (primary) hypertension: Secondary | ICD-10-CM

## 2021-05-14 DIAGNOSIS — H539 Unspecified visual disturbance: Secondary | ICD-10-CM

## 2021-05-14 DIAGNOSIS — Z79899 Other long term (current) drug therapy: Secondary | ICD-10-CM | POA: Insufficient documentation

## 2021-05-14 DIAGNOSIS — R519 Headache, unspecified: Secondary | ICD-10-CM | POA: Insufficient documentation

## 2021-05-14 DIAGNOSIS — G4453 Primary thunderclap headache: Secondary | ICD-10-CM | POA: Diagnosis not present

## 2021-05-14 DIAGNOSIS — Z8673 Personal history of transient ischemic attack (TIA), and cerebral infarction without residual deficits: Secondary | ICD-10-CM | POA: Insufficient documentation

## 2021-05-14 DIAGNOSIS — R51 Headache with orthostatic component, not elsewhere classified: Secondary | ICD-10-CM

## 2021-05-14 DIAGNOSIS — I69351 Hemiplegia and hemiparesis following cerebral infarction affecting right dominant side: Secondary | ICD-10-CM | POA: Diagnosis not present

## 2021-05-14 DIAGNOSIS — G319 Degenerative disease of nervous system, unspecified: Secondary | ICD-10-CM | POA: Insufficient documentation

## 2021-05-14 DIAGNOSIS — E119 Type 2 diabetes mellitus without complications: Secondary | ICD-10-CM | POA: Diagnosis not present

## 2021-05-14 DIAGNOSIS — G08 Intracranial and intraspinal phlebitis and thrombophlebitis: Secondary | ICD-10-CM

## 2021-05-14 DIAGNOSIS — R29898 Other symptoms and signs involving the musculoskeletal system: Secondary | ICD-10-CM

## 2021-05-14 HISTORY — PX: RADIOLOGY WITH ANESTHESIA: SHX6223

## 2021-05-14 LAB — BASIC METABOLIC PANEL
Anion gap: 10 (ref 5–15)
BUN: 17 mg/dL (ref 8–23)
CO2: 26 mmol/L (ref 22–32)
Calcium: 9.6 mg/dL (ref 8.9–10.3)
Chloride: 103 mmol/L (ref 98–111)
Creatinine, Ser: 0.68 mg/dL (ref 0.44–1.00)
GFR, Estimated: >60 mL/min (ref >60)
Glucose, Bld: 204 mg/dL — ABNORMAL HIGH (ref 70–99)
Potassium: 3.4 mmol/L — ABNORMAL LOW (ref 3.5–5.1)
Sodium: 139 mmol/L (ref 135–145)

## 2021-05-14 LAB — GLUCOSE, CAPILLARY
Glucose-Capillary: 201 mg/dL — ABNORMAL HIGH (ref 70–99)
Glucose-Capillary: 166 mg/dL — ABNORMAL HIGH (ref 70–99)
Glucose-Capillary: 109 mg/dL — ABNORMAL HIGH (ref 70–99)

## 2021-05-14 SURGERY — MRI WITH ANESTHESIA
Anesthesia: Monitor Anesthesia Care

## 2021-05-14 MED ORDER — LACTATED RINGERS IV SOLN: INTRAVENOUS | Status: DC

## 2021-05-14 MED ORDER — ORAL CARE MOUTH RINSE: 15.0000 mL | Freq: Once | OROMUCOSAL | Status: AC

## 2021-05-14 MED ORDER — ONDANSETRON HCL 4 MG/2ML IJ SOLN
INTRAMUSCULAR | Status: DC | PRN
  Administered 2021-05-14: 4 mg via INTRAVENOUS

## 2021-05-14 MED ORDER — INSULIN ASPART 100 UNIT/ML IJ SOLN
INTRAMUSCULAR | Status: AC
  Administered 2021-05-14: 2 [IU] via SUBCUTANEOUS
  Filled 2021-05-14: qty 1

## 2021-05-14 MED ORDER — GADOBUTROL 1 MMOL/ML IV SOLN
5.0000 mL | Freq: Once | INTRAVENOUS | Status: AC | PRN
  Administered 2021-05-14: 5 mL via INTRAVENOUS

## 2021-05-14 MED ORDER — HYDRALAZINE HCL 20 MG/ML IJ SOLN
10.0000 mg | Freq: Once | INTRAMUSCULAR | Status: AC
  Administered 2021-05-14: 10 mg via INTRAVENOUS

## 2021-05-14 MED ORDER — HYDRALAZINE HCL 20 MG/ML IJ SOLN
INTRAMUSCULAR | Status: AC
  Filled 2021-05-14: qty 1

## 2021-05-14 MED ORDER — MIDAZOLAM HCL 2 MG/2ML IJ SOLN
INTRAMUSCULAR | Status: DC | PRN
  Administered 2021-05-14 (×2): 1 mg via INTRAVENOUS

## 2021-05-14 MED ORDER — INSULIN ASPART 100 UNIT/ML IJ SOLN: 0.0000 [IU] | INTRAMUSCULAR | Status: DC | PRN

## 2021-05-14 MED ORDER — CHLORHEXIDINE GLUCONATE 0.12 % MT SOLN: 15.0000 mL | Freq: Once | OROMUCOSAL | Status: AC

## 2021-05-14 MED ORDER — CHLORHEXIDINE GLUCONATE 0.12 % MT SOLN
OROMUCOSAL | Status: AC
  Administered 2021-05-14: 15 mL via OROMUCOSAL
  Filled 2021-05-14: qty 15

## 2021-05-14 NOTE — Transfer of Care (Signed)
Immediate Anesthesia Transfer of Care Note ? ?Patient: Brandy Flowers ? ?Procedure(s) Performed: MRI BRAIN WITH AND WITHOUT CONTRAST WITH ANESTHESIA ?MRV WITHOUT CONTRAST WITH ANESTHESIA ? ?Patient Location: PACU ? ?Anesthesia Type:General ? ?Level of Consciousness: awake, alert  and oriented ? ?Airway & Oxygen Therapy: Patient Spontanous Breathing and Patient connected to face mask oxygen ? ?Post-op Assessment: Report given to RN and Post -op Vital signs reviewed and stable ? ?Post vital signs: Reviewed and stable ? ?Last Vitals:  ?Vitals Value Taken Time  ?BP 171/66 05/14/21 1228  ?Temp 36.4 ?C 05/14/21 1228  ?Pulse 72 05/14/21 1231  ?Resp 14 05/14/21 1231  ?SpO2 96 % 05/14/21 1231  ?Vitals shown include unvalidated device data. ? ?Last Pain:  ?Vitals:  ? 05/14/21 1228  ?TempSrc:   ?PainSc: 0-No pain  ?   ? ?  ? ?Complications: No notable events documented. ?

## 2021-05-14 NOTE — Anesthesia Postprocedure Evaluation (Signed)
Anesthesia Post Note ? ?Patient: Brandy Flowers ? ?Procedure(s) Performed: MRI BRAIN WITH AND WITHOUT CONTRAST WITH ANESTHESIA ?MRV WITHOUT CONTRAST WITH ANESTHESIA ? ?  ? ?Patient location during evaluation: PACU ?Anesthesia Type: MAC ?Level of consciousness: awake and alert ?Pain management: pain level controlled ?Vital Signs Assessment: post-procedure vital signs reviewed and stable ?Respiratory status: spontaneous breathing, nonlabored ventilation and respiratory function stable ?Cardiovascular status: stable and blood pressure returned to baseline ?Postop Assessment: no apparent nausea or vomiting ?Anesthetic complications: no ? ? ?No notable events documented. ? ?Last Vitals:  ?Vitals:  ? 05/14/21 1230 05/14/21 1243  ?BP: (!) 171/66 (!) 171/81  ?Pulse: 71 76  ?Resp: 13 16  ?Temp:    ?SpO2: 96% 96%  ?  ?Last Pain:  ?Vitals:  ? 05/14/21 1228  ?TempSrc:   ?PainSc: 0-No pain  ? ? ?  ?  ?  ?  ?  ?  ? ?[name] ? ? ? ? ?

## 2021-05-15 ENCOUNTER — Encounter: Payer: Self-pay | Admitting: Neurology

## 2021-05-15 ENCOUNTER — Encounter (HOSPITAL_COMMUNITY): Payer: Self-pay | Admitting: Radiology

## 2021-05-18 ENCOUNTER — Encounter: Payer: Self-pay | Admitting: Neurology

## 2021-05-18 NOTE — Telephone Encounter (Signed)
Noted. Message sent to pt in other mychart encounter.  ?

## 2021-06-01 NOTE — Progress Notes (Signed)
?Cardiology Office Note:   ? ?Date:  06/02/2021  ? ?ID:  Brandy Flowers, DOB 01/06/1941, MRN [id number] ? ?PCP:  Lars Mageetter, Devin B, NP  ?Cardiologist:  Norman HerrlichBrian Delia Slatten, MD   ? ?Referring MD: Lars Mageetter, Devin B, NP  ? ? ?ASSESSMENT:   ? ?1. Renal artery stenosis (HCC)   ?2. Essential hypertension   ?3. Hyperlipidemia, unspecified hyperlipidemia type   ?4. Statin not tolerated   ? ?PLAN:   ? ?In order of problems listed above: ? ?Obviously her blood pressure is very poorly controlled many barriers to care including education absence of a blood pressure cuff at home and intolerance to medications.  I would like to see her blood pressure achieve a better goal in the range of 1 50-1 60 systolic we will transition from atenolol she is ineffective to carvedilol if she remains elevated with a reliable device which she sees her PCP I would stop losartan and ideas telmisartan and continue to titrate her medications ?Your PCSK9 inhibitor ?See me in 6 months we will recheck a renal artery duplex ? ? ?Next appointment: 6 months ? ? ?Medication Adjustments/Labs and Tests Ordered: ?Current medicines are reviewed at length with the patient today.  Concerns regarding medicines are outlined above.  ?No orders of the defined types were placed in this encounter. ? ?No orders of the defined types were placed in this encounter. ? ? ?Chief Complaint  ?Patient presents with  ? Hypertension  ? ? ?History of Present Illness:   ? ?Brandy Flowers is a 81 y.o. female with a hx of renal artery stenosis with hypertension type 2 diabetes and neuropathy and severe dyslipidemia intolerant of statins and Zetia treated with a PCSK9 inhibitor last seen 01/03/2020. ? ?Compliance with diet, lifestyle and medications: Yes ? ?There is been an enormous amount of discord in her care she has a new primary care physician and she has been given different medications. ?Unfortunately she checks her blood pressure at home with a wrist device that is unreliable ?She  attributes symptoms like dizziness and nausea to her blood pressure medications ?I am going to initiate change switching from atenolol ineffective to carvedilol for BP and if she remains above target I would transition to a higher intensity ARB like Micardis. ?Is me by asking if I reviewed her cardiac testing she had done we are able to access those and [facility] which showed no flow-limiting stenosis in the extracranial circulation engine [facility] which showed normal ejection fraction left atrial enlargement trace to mild aortic regurgitation mild-moderate mitral regurgitation the indication was stroke and hypertension. ?She takes high-dose aspirin for joint pain ?No chest pain edema palpitation or syncope ?Past Medical History:  ?Diagnosis Date  ? Anxiety   ? Arthritis   ? COPD with exacerbation (HCC)   ? Diabetes mellitus without complication (HCC)   ? no meds, diet controlled - type 2  ? Diastolic dysfunction   ? Grade 1  ? Hyperlipidemia   ? Hypertension   ? Hypothyroidism   ? Migraines   ? Renal artery stenosis (HCC)   ? Left 50%  ? Sigmoid diverticulosis   ? Sinus problem   ? Splenic artery aneurysm (HCC)   ? 7mm  ? Stroke (HCC) 12/16/2017  ? Thyroid disease   ? ? ?Past Surgical History:  ?Procedure Laterality Date  ? BREAST REDUCTION SURGERY  2007  ? CATARACT EXTRACTION Bilateral   ? CHOLECYSTECTOMY    ?  COLONOSCOPY    ? RADIOLOGY WITH ANESTHESIA N/A 05/14/2021  ? Procedure: MRI BRAIN WITH AND WITHOUT CONTRAST WITH ANESTHESIA;  Surgeon: Radiologist, Medication, MD;  Location: [facility] OR;  Service: Radiology;  Laterality: N/A;  ? RADIOLOGY WITH ANESTHESIA N/A 05/14/2021  ? Procedure: MRV WITHOUT CONTRAST WITH ANESTHESIA;  Surgeon: Radiologist, Medication, MD;  Location: [facility] OR;  Service: Radiology;  Laterality: N/A;  ? TONSILLECTOMY AND ADENOIDECTOMY    ? TOOTH EXTRACTION  2019  ? with anesthesia  ? WISDOM TOOTH EXTRACTION    ? ? ?Current  Medications: ?Current Meds  ?Medication Sig  ? ACETAMINOPHEN-BUTALBITAL 50-325 MG TABS Take 1 tablet by mouth 3 (three) times daily as needed (Headache).  ? Acetaminophen-Codeine 300-30 MG tablet Take 0.5-1 tablets by mouth every 6 (six) hours as needed for pain.  ? amLODipine (NORVASC) 2.5 MG tablet Take 2.5 mg by mouth daily.  ? aspirin 325 MG tablet Take 650 mg by mouth daily.  ? atenolol (TENORMIN) 50 MG tablet Take 50 mg by mouth 2 (two) times daily.  ? Continuous Blood Gluc Receiver (FREESTYLE LIBRE 2 READER) DEVI   ? Continuous Blood Gluc Sensor (FREESTYLE LIBRE 2 SENSOR) MISC   ? diclofenac (FLECTOR) 1.3 % PTCH Place 1 patch onto the skin 2 (two) times daily as needed (pain).  ? eplerenone (INSPRA) 50 MG tablet Take 50 mg by mouth daily.  ? Evolocumab (REPATHA SURECLICK) 140 MG/ML SOAJ Inject 140 mg into the skin every 14 (fourteen) days.  ? Galcanezumab-gnlm (EMGALITY) 120 MG/ML SOAJ Inject 120 mg into the skin every 30 (thirty) days.  ? lidocaine (LIDODERM) 5 % 1 patch daily as needed (pain).  ? LORazepam (ATIVAN) 1 MG tablet Take 1 mg by mouth 3 (three) times daily as needed for anxiety.  ? losartan (COZAAR) 50 MG tablet Take 50 mg by mouth 2 (two) times daily.  ? meclizine (ANTIVERT) 25 MG tablet Take 25 mg by mouth 3 (three) times daily as needed for dizziness.  ? SYNTHROID 50 MCG tablet Take 100 mcg by mouth daily before breakfast. Must take (2) 50 mg can not take blue pills  ?  ? ?Allergies:   Amiloride, Blue dyes (parenteral), Inderal la [propranolol hcl], Sulfa antibiotics, Ace inhibitors, Norvasc [amlodipine], Calcium channel blockers, Catapres [clonidine hcl], Clonidine derivatives, Diovan [valsartan], Hydralazine, Metformin and related, Micardis [telmisartan], Minoxidil, Plavix [clopidogrel], Serotonin reuptake inhibitors (ssris), Sulfur, Topamax [topiramate], Tramadol, Tylenol with codeine #3 [acetaminophen-codeine], Zetia [ezetimibe], Amoxicillin, Chlorthalidone, Doxycycline, Fosamax  [alendronate sodium], Lexapro [escitalopram], Spironolactone, and Statins  ? ?Social History  ? ?Socioeconomic History  ? Marital status: Widowed  ?  Spouse name: Not on file  ? Number of children: Not on file  ? Years of education: Not on file  ? Highest education level: Not on file  ?Occupational History  ? Not on file  ?Tobacco Use  ? Smoking status: Never  ? Smokeless tobacco: Never  ?Vaping Use  ? Vaping Use: Never used  ?Substance and Sexual Activity  ? Alcohol use: No  ? Drug use: No  ? Sexual activity: Not Currently  ?  Birth control/protection: Post-menopausal  ?Other Topics Concern  ? Not on file  ?Social History Narrative  ? Lives at home with her dog   ? Right handed  ? Caffeine: maybe 1 cup/day  ? ?Social Determinants of Health  ? ?Financial Resource Strain: Not on file  ?Food Insecurity: Not on file  ?Transportation Needs: Not on file  ?Physical Activity: Not on file  ?Stress: Not on  file  ?Social Connections: Not on file  ?  ? ?Family History: ?The patient's family history includes Diabetes in her mother; Heart attack in her father; Heart disease in her brother, father, and mother; Hyperlipidemia in her mother; Hypertension in her brother, father, and mother; Stroke in her mother. There is no history of Migraines. ?ROS:   ?Please see the history of present illness.    ?All other systems reviewed and are negative. ? ?EKGs/Labs/Other Studies Reviewed:   ? ?The following studies were reviewed today: ? ? ?Recent Labs: ?04/09/2021: Hemoglobin 14.9; Platelets 258 ?05/14/2021: BUN 17; Creatinine, Ser 0.68; Potassium 3.4; Sodium 139  ?Recent Lipid Panel ?   ?Component Value Date/Time  ? CHOL 205 (H) 12/18/2017 0353  ? TRIG 124 12/18/2017 0353  ? HDL 41 12/18/2017 0353  ? CHOLHDL 5.0 12/18/2017 0353  ? VLDL 25 12/18/2017 0353  ? LDLCALC 139 (H) 12/18/2017 0353  ? ? ?Physical Exam:   ? ?VS:  BP (!) 180/72   Pulse 65   Ht 5\' 1"  (1.549 m)   Wt 109 lb 6.4 oz (49.6 kg)   SpO2 97%   BMI 20.67 kg/m?    ? ?Wt  Readings from Last 3 Encounters:  ?06/02/21 109 lb 6.4 oz (49.6 kg)  ?05/14/21 108 lb 14.5 oz (49.4 kg)  ?04/27/21 109 lb (49.4 kg)  ?  ? ?GEN:  Well nourished, well developed in no acute distress ?HEENT: Normal ?NECK: No JVD; No

## 2021-06-02 ENCOUNTER — Encounter: Payer: Self-pay | Admitting: Cardiology

## 2021-06-02 ENCOUNTER — Ambulatory Visit (INDEPENDENT_AMBULATORY_CARE_PROVIDER_SITE_OTHER): Payer: Medicare Other | Admitting: Cardiology

## 2021-06-02 ENCOUNTER — Ambulatory Visit (INDEPENDENT_AMBULATORY_CARE_PROVIDER_SITE_OTHER): Payer: Medicare Other

## 2021-06-02 ENCOUNTER — Encounter: Payer: Self-pay | Admitting: Neurology

## 2021-06-02 VITALS — BP 180/72 | HR 65 | Ht 61.0 in | Wt 109.4 lb

## 2021-06-02 DIAGNOSIS — Z789 Other specified health status: Secondary | ICD-10-CM

## 2021-06-02 DIAGNOSIS — I701 Atherosclerosis of renal artery: Secondary | ICD-10-CM

## 2021-06-02 DIAGNOSIS — E785 Hyperlipidemia, unspecified: Secondary | ICD-10-CM

## 2021-06-02 DIAGNOSIS — I10 Essential (primary) hypertension: Secondary | ICD-10-CM

## 2021-06-02 MED ORDER — CARVEDILOL 6.25 MG PO TABS: 6.2500 mg | ORAL_TABLET | Freq: Two times a day (BID) | ORAL | 3 refills | Status: DC

## 2021-06-02 NOTE — Patient Instructions (Addendum)
Medication Instructions:  ?Discontinue Atenolol  ?Start Carvedilol (Coreg) 6.25 mg twice a day  ? ?*If you need a refill on your cardiac medications before your next appointment, please call your pharmacy* ? ? ?Lab Work: ?None ordered  ? ?If you have labs (blood work) drawn today and your tests are completely normal, you will receive your results only by: ?MyChart Message (if you have MyChart) OR ?A paper copy in the mail ?If you have any lab test that is abnormal or we need to change your treatment, we will call you to review the results. ? ? ?Testing/Procedures: ?Your physician has requested that you have a Renal Artery Duplex  ? ? ?Follow-Up: ?At [facility], you and your health needs are our priority.  As part of our continuing mission to provide you with exceptional heart care, we have created designated Provider Care Teams.  These Care Teams include your primary Cardiologist (physician) and Advanced Practice Providers (APPs -  Physician Assistants and Nurse Practitioners) who all work together to provide you with the care you need, when you need it. ? ?We recommend signing up for the patient portal called "MyChart".  Sign up information is provided on this After Visit Summary.  MyChart is used to connect with patients for Virtual Visits (Telemedicine).  Patients are able to view lab/test results, encounter notes, upcoming appointments, etc.  Non-urgent messages can be sent to your provider as well.   ?To learn more about what you can do with MyChart, go to [email or web address].   ? ?Your next appointment:   ?6 month(s) ? ?The format for your next appointment:   ?In Person ? ?Provider:   ?Norman Herrlich, MD  ? ? ?Other Instructions ? ? ?Important Information About Sugar ? ? ? ? ? Healthbeat  ?Tips to measure your blood pressure correctly ? ?To determine whether you have hypertension, a medical professional will take a blood pressure reading. How you prepare for the test, the position of your arm, and other  factors can change a blood pressure reading by 10% or more. That could be enough to hide high blood pressure, start you on a drug you don't really need, or lead your doctor to incorrectly adjust your medications. ?National and international guidelines offer specific instructions for measuring blood pressure. If a doctor, nurse, or medical assistant isn't doing it right, don't hesitate to ask him or her to get with the guidelines. ?Here's what you can do to ensure a correct reading: ? Don't drink a caffeinated beverage or smoke during the 30 minutes before the test. ? Sit quietly for five minutes before the test begins. ? During the measurement, sit in a chair with your feet on the floor and your arm supported so your elbow is at about heart level. ? The inflatable part of the cuff should completely cover at least 80% of your upper arm, and the cuff should be placed on bare skin, not over a shirt. ? Don't talk during the measurement. ? Have your blood pressure measured twice, with a brief break in between. If the readings are different by 5 points or more, have it done a third time. ?There are times to break these rules. If you sometimes feel lightheaded when getting out of bed in the morning or when you stand after sitting, you should have your blood pressure checked while seated and then while standing to see if it falls from one position to the next. ?Because blood pressure varies throughout the day, your doctor will  rarely diagnose hypertension on the basis of a single reading. Instead, he or she will want to confirm the measurements on at least two occasions, usually within a few weeks of one another. The exception to this rule is if you have a blood pressure reading of 180/110 mm Hg or higher. A result this high usually calls for prompt treatment. ?It's also a good idea to have your blood pressure measured in both arms at least once, since the reading in one arm (usually the right) may be higher than that in  the left. A 2014 study in The American Journal of Medicine of nearly 3,400 people found average arm- to-arm differences in systolic blood pressure of about 5 points. The higher number should be used to make treatment decisions. ?In 2017, new guidelines from the American Heart Association, the [employer] of Cardiology, and nine other health organizations lowered the diagnosis of high blood pressure to 130/80 mm Hg or higher for all adults. The guidelines also redefined the various blood pressure categories to now include normal, elevated, Stage 1 hypertension, Stage 2 hypertension, and hypertensive crisis (see "Blood pressure categories"). ?Blood pressure categories  ?Blood pressure category SYSTOLIC ?(upper number)  DIASTOLIC ?(lower number)  ?Normal Less than 120 mm Hg and Less than 80 mm Hg  ?Elevated 120-129 mm Hg and Less than 80 mm Hg  ?High blood pressure: Stage 1 hypertension 130-139 mm Hg or 80-89 mm Hg  ?High blood pressure: Stage 2 hypertension 140 mm Hg or higher or 90 mm Hg or higher  ?Hypertensive crisis (consult your doctor immediately) Higher than 180 mm Hg and/or Higher than 120 mm Hg  ?Source: American Heart Association and American Stroke Association. ?For more on getting your blood pressure under control, buy Controlling Your Blood Pressure, a Special Health Report from [facility].  ?

## 2021-06-11 ENCOUNTER — Ambulatory Visit (INDEPENDENT_AMBULATORY_CARE_PROVIDER_SITE_OTHER): Payer: Medicare Other | Admitting: Neurology

## 2021-06-11 DIAGNOSIS — R519 Headache, unspecified: Secondary | ICD-10-CM | POA: Diagnosis not present

## 2021-06-11 DIAGNOSIS — G8929 Other chronic pain: Secondary | ICD-10-CM | POA: Diagnosis not present

## 2021-06-11 NOTE — Progress Notes (Signed)
?[facility] ? ? ? ?Provider:  Dr Lucia Gaskins ?Requesting Provider: Lars Mage, NP ?Primary Care Provider:  Lars Mage, NP ? ?CC:  headaches ? ?Virtual Visit via Telephone Note ? ?I connected with Brandy Flowers on 06/11/21 at  9:30 AM EDT by telephone and verified that I am speaking with the correct person using two identifiers. ? ?Location: ?Patient: home ?Provider: office ?  ?I discussed the limitations, risks, security and privacy concerns of performing an evaluation and management service by telephone and the availability of in person appointments. I also discussed with the patient that there may be a patient responsible charge related to this service. The patient expressed understanding and agreed to proceed. ? ? ?Follow Up Instructions: ? ?  ?I discussed the assessment and treatment plan with the patient. The patient was provided an opportunity to ask questions and all were answered. The patient agreed with the plan and demonstrated an understanding of the instructions. ?  ?The patient was advised to call back or seek an in-person evaluation if the symptoms worsen or if the condition fails to improve as anticipated. ? ?I provided  over 30 minutes of non-face-to-face time during this encounter. ? ? ?[name], MD ? ?06/11/2021: She is significantly improved on the Emgality. Headaches are gone. She has not had a headache in 2 weeks. Dr. Dulce Sellar is her new cardiologist and he changed atenolol to coreg and her headaches returned, she took the atenolol and the headache improved. She also has a new pcp Chief of Staff and patient is very happy. She gets Emgality from [employer], expires in June needs to be extended for medical necessity. She spoke to [place] and ave her the number we are aware.  ? ?April 06, 2021: Patient states she has less headaches but she is still having them, they can be bowel moderate to severe, she still perseverates on medication even though Dr. Blanchard Mane took her  off olmesartan she said the new medication made her dizzy the coming off of it increased her blood pressure to 180, I had not know why she started having headaches, CT of the head in November 2022 showed small vessel disease but nothing acute reviewed images, she had an MRI in 2020 which showed No acute abnormality, Atrophy and mild chronic ischemic change including the pons, I reviewed that as well.  The prior year she had an MRI 12/2017 with Worsening brainstem infarction bilateral pons. We discussed. May recommend MRI brain and MRV of the head. We discussed all above.  ? ?Patient complains of symptoms per HPI as well as the following symptoms: headache . Pertinent negatives and positives per HPI. All others negative ? ? ?02/12/2021: Patient perseverates on medications causing her headaches.  I have had contact with Dr. [name] who does not believe that her medications are playing a role.  We tried [name] for her as she just wanted to start something acutely and not preventatively.  She continued to have migrainous headaches.  We got [name] approved. We got emgality approved. She is getting her first injections with training today. She reports unilateral migraines worsening > 3 months, pulsating/pounding/throbbing with nausea and light sensitivity, lasting 4-24 hours, movement makes it worse. She is on Technical sales engineer and ubrelvy as needed.  ?Just here for a nurse viti to inject. ? ?HPI:  Brandy Flowers is a 81 y.o. female here as requested by Lars Mage, NP for headaches. Pmhx diabetes, headaches, COPD, statin myopathy, thyroid disease, stroke, migraines, hypothyroidism,  hyperlipidemia, arthritis, anxiety, sinus problems, renal artery stenosis, hyponatremia, labile blood pressure, pontine cerebrovascular accident, oropharyngeal dysphagia, hypertension.  I reviewed Dr. Geoffery SpruceProchnow's notes: Patient sees a chiropractor, she has neck problems with dizziness and headaches.  I reviewed Epic notes, Patient was  seen by Dr. Anne HahnWillis here in the office in December 2020 after stroke in January 03, 2018, she presented with generalized weakness and slurred speech that was acute in onset, at the time she was walking with a cane, she returned to the hospital on December 26, 2018 with vertigo that was positional in nature, MRI of the brain done at that time showed no acute changes, she started having headaches sometime in late summer 2020, but at the time she saw Dr. Anne HahnWillis on February 08, 2019 she states that the headaches had resolved the last 2 months after stopping a medication with a diet and at that she thinks she was allergic to or sensitive to.  She also complained of some low back pain and some occasional right leg pain that comes and goes, history of restless leg syndrome, she has not been able to tolerate statins in the past. ? ?Headaches are In the temples and feels like her whole head is pressure, pulsating, nausea, not much light sensitivity. Codeine helps but makes her dizzy. Headaches started after the olmesartan and inspra. Not pulsating/pounding. Not photophobia or phonophobia, some mild nausea. Dramamine helps with the nausea but not dizziness. Sometimes unilateral, she is having headaches daily. Sometimes she wakes up with them. She is not excessively fatigued during the day, may feel a little weak. When she gets headaches she check blood pressure. Stopped taking the butalbital. She is taking a codeine. Laying down helps. Dizziness is from the codeine, no vision changes. Ongoing over a year, worsening, more frequent, some days they can be severe but other days mild, usually doesn't wake up in the morning with them but develops during the day, between 4-14 total headache days a month ? ? ?Reviewed notes, labs and imaging from outside physicians, which showed: ? ?Recent blood work(from referrng provider notes) includes normal TSH and T4, LDL 64, CBC normal, CMP with elevated glucose 128 otherwise normal,  hemoglobin A1c 6.3. ? ?From a thorough review of records, medications tried that can be used in migraine management include aspirin, meclizine, Fioricet, atenolol, gabapentin, Tylenol, amlodipine, aspirin, atenolol, Lexapro, losartan, meclizine, Mobic, Zofran, prednisone, Compazine injections, scopolamine patches, tizanidine, topiramate, amitriptyline ? ?MRI brain 12/2018: FINDINGS: personally reviewed images and agree ?Brain: Negative for acute infarct. Chronic infarct in the central ?pons. No significant white matter ischemia. Negative for hemorrhage ?or mass. Mild atrophy without hydrocephalus. ?  ?Vascular: Normal arterial flow voids ?  ?Skull and upper cervical spine: Negative ?  ?Sinuses/Orbits: Mild mucosal edema paranasal sinuses. Bilateral ?cataract surgery ?  ?Other: None ?  ?IMPRESSION: ?No acute abnormality ?  ?Atrophy and mild chronic ischemic change including the pons. ? ?Review of Systems: ?Patient complains of symptoms per HPI as well as the following symptoms headache. Pertinent negatives and positives per HPI. All others negative. ? ? ?Social History  ? ?Socioeconomic History  ? Marital status: Widowed  ?  Spouse name: Not on file  ? Number of children: Not on file  ? Years of education: Not on file  ? Highest education level: Not on file  ?Occupational History  ? Not on file  ?Tobacco Use  ? Smoking status: Never  ? Smokeless tobacco: Never  ?Vaping Use  ? Vaping Use: Never used  ?  Substance and Sexual Activity  ? Alcohol use: No  ? Drug use: No  ? Sexual activity: Not Currently  ?  Birth control/protection: Post-menopausal  ?Other Topics Concern  ? Not on file  ?Social History Narrative  ? Lives at home with her dog   ? Right handed  ? Caffeine: maybe 1 cup/day  ? ?Social Determinants of Health  ? ?Financial Resource Strain: Not on file  ?Food Insecurity: Not on file  ?Transportation Needs: Not on file  ?Physical Activity: Not on file  ?Stress: Not on file  ?Social Connections: Not on file   ?Intimate Partner Violence: Not on file  ? ? ?Family History  ?Problem Relation Age of Onset  ? Stroke Mother   ? Diabetes Mother   ? Heart disease Mother   ?     Before age 8  ? Hypertension Mother   ? Hyperlipidem

## 2021-06-16 ENCOUNTER — Encounter: Payer: Self-pay | Admitting: Neurology

## 2021-06-17 ENCOUNTER — Encounter: Payer: Self-pay | Admitting: Cardiology

## 2021-06-17 ENCOUNTER — Telehealth: Payer: Self-pay | Admitting: *Deleted

## 2021-06-17 NOTE — Telephone Encounter (Signed)
Received fax from [facility] that stated Emgality has ben approved 05/18/21-06/17/22. Letter faxed to [facility] for update. Received a receipt of confirmation. ? ?

## 2021-06-17 NOTE — Telephone Encounter (Signed)
Completed Emgality PA on Cover My Meds. Key: [id number]. Awaiting determination from [employer].  ?

## 2021-06-24 ENCOUNTER — Encounter: Payer: Self-pay | Admitting: Neurology

## 2021-07-07 ENCOUNTER — Ambulatory Visit: Payer: Medicare Other | Admitting: Neurology

## 2021-07-08 ENCOUNTER — Encounter: Payer: Self-pay | Admitting: Behavioral Health

## 2021-07-08 ENCOUNTER — Ambulatory Visit (INDEPENDENT_AMBULATORY_CARE_PROVIDER_SITE_OTHER): Payer: Medicare Other | Admitting: Behavioral Health

## 2021-07-08 VITALS — BP 166/78 | HR 78 | Ht 61.0 in | Wt 106.0 lb

## 2021-07-08 DIAGNOSIS — F411 Generalized anxiety disorder: Secondary | ICD-10-CM

## 2021-07-08 MED ORDER — LORAZEPAM 1 MG PO TABS: 1.0000 mg | ORAL_TABLET | Freq: Two times a day (BID) | ORAL | 1 refills | Status: DC

## 2021-07-08 MED ORDER — BUSPIRONE HCL 5 MG PO TABS: 5.0000 mg | ORAL_TABLET | Freq: Two times a day (BID) | ORAL | 1 refills | Status: DC

## 2021-07-08 NOTE — Progress Notes (Signed)
[facility] MD/PA/NP Initial Note  07/08/2021 12:53 PM Linita Alegria  MRN:  [id number]  Chief Complaint:  Chief Complaint   Depression; Anxiety; Medication Problem; Medication Refill; Patient Education; Hypertension     HPI:  "Noelly", 81 year old female presents to this office for initial visit and to establish care. A friend of the family is present with her consents. Friend provides transportation and assist with medical appointments. She says that her MD was retiring and that she need assistance with anxiety. Says that she has been switched between Klonopin and Ativan several times but believes Klonopin will help fix her increased BP. Her list of  reported allergy or undesirable side effect to medication is very lengthy and she is very fixated on her BP. She says that she will get a second opinion from a different cardiologist. She says that she is open to trying a different medication to assist with anxiety besides a benzo. She would ultimately like to stop the benzo's. She is denying all depression but becomes tearful when talking about her health. Has negative MDQ with all criterion unremarkable. Negative PHQ-2. Says her depression is 0/10 but anxiety is 7/10. Says she does sleep 7 hours per night. She denies mania or psychosis. No delirium, no auditory or visual hallucinations reported. No SI/HI.   Prior psychiatric medications: Klonopin Ativan Clonidine-Headache, flu like symptoms Lexapro-brain fog  Visit Diagnosis:    ICD-10-CM   1. 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   Stroke ([place]) 12/16/2017   Thyroid disease     Past Surgical History:  Procedure Laterality Date   BREAST REDUCTION SURGERY  2007   CATARACT EXTRACTION Bilateral    CHOLECYSTECTOMY     COLONOSCOPY     RADIOLOGY WITH ANESTHESIA N/A 05/14/2021   Procedure: MRI BRAIN WITH AND WITHOUT CONTRAST WITH ANESTHESIA;  Surgeon: Radiologist, Medication, MD;  Location: [place];  Service: Radiology;  Laterality: N/A;   RADIOLOGY WITH ANESTHESIA N/A 05/14/2021   Procedure: MRV WITHOUT CONTRAST WITH ANESTHESIA;  Surgeon: Radiologist, Medication, MD;  Location: [place];  Service: Radiology;  Laterality: N/A;   TONSILLECTOMY AND ADENOIDECTOMY     TOOTH EXTRACTION  2019   with anesthesia   WISDOM TOOTH EXTRACTION      Family Psychiatric History: none noted this visit  Family History:  Family History  Problem Relation Age of Onset   Stroke Mother    Diabetes Mother    Heart disease Mother        Before age 77   Hypertension Mother    Hyperlipidemia Mother    Heart disease Father    Heart attack Father    Hypertension Father    Heart disease Brother        After age 50- CABG   Hypertension Brother  Migraines Neg Hx     Social History:  Social History   Socioeconomic History   Marital status: Widowed    Spouse name: Not on file   Number of children: Not on file   Years of education: Not on file   Highest education level: Not on file  Occupational History   Not on file  Tobacco Use   Smoking status: Never   Smokeless tobacco: Never  Vaping Use   Vaping Use: Never used  Substance and Sexual Activity   Alcohol use: No   Drug use: No   Sexual activity: Not Currently    Birth control/protection: Post-menopausal  Other Topics Concern   Not on file  Social History Narrative   Lives at home with her dog    Right handed   Caffeine: maybe 1 cup/day    Social Determinants of Health   Financial Resource Strain: Not on file  Food Insecurity: Not on file  Transportation Needs: Not on file  Physical Activity: Not on file  Stress: Not on file  Social Connections: Not on file    Allergies:  Allergies  Allergen Reactions   Amiloride Shortness Of Breath   Blue Dyes (Parenteral) Shortness Of Breath, Rash and Other (See Comments)    Joint pain   Inderal La [Propranolol Hcl] Shortness Of Breath   Sulfa Antibiotics Anaphylaxis   Ace Inhibitors Nausea And Vomiting   Norvasc [Amlodipine] Nausea And Vomiting    Taking at this time. No problems   Calcium Channel Blockers Diarrhea   Catapres [Clonidine Hcl]     Decreased mental clarity    Clonidine Derivatives Other (See Comments)    Headache, flu like symptoms   Diovan [Valsartan] Cough   Hydralazine Other (See Comments)    Headache   Metformin And Related Diarrhea   Micardis [Telmisartan] Other (See Comments)    Hallucinations, increase in BP   Minoxidil Other (See Comments)    Fast heart rate, chest pain   Plavix [Clopidogrel]     Increased LFT's    Serotonin Reuptake Inhibitors (Ssris) Other (See Comments)    Headache   Sulfur    Topamax [Topiramate] Other (See Comments)    Confusion   Tramadol     Nausea as a dog per pt   Tylenol With Codeine #3 [Acetaminophen-Codeine] Other (See Comments)    Lightheaded   Zetia [Ezetimibe]     Liver  Function increase labs   Amoxicillin Diarrhea   Chlorthalidone     Dizziness.    Doxycycline Diarrhea   Fosamax [Alendronate Sodium]     Pt's teeth fell out while taking.    Lexapro [Escitalopram]     Brain Fog    Spironolactone Palpitations   Statins     Upset stomach     Metabolic Disorder Labs: Lab Results  Component Value Date   HGBA1C 6.5 (H) 12/18/2017   MPG 139.85 12/18/2017   No results found for: PROLACTIN Lab Results  Component Value Date   CHOL 205 (H) 12/18/2017   TRIG 124 12/18/2017   HDL 41 12/18/2017    CHOLHDL 5.0 12/18/2017   VLDL 25 12/18/2017   LDLCALC 139 (H) 12/18/2017   No results found for: TSH  Therapeutic Level Labs: No results found for: LITHIUM No results found for: VALPROATE No components found for:  CBMZ  Current Medications: Current Outpatient Medications  Medication Sig Dispense Refill   busPIRone (BUSPAR) 5 MG tablet Take 1 tablet (5 mg total) by mouth 2 (two) times  daily. 60 tablet 1   ACETAMINOPHEN-BUTALBITAL 50-325 MG TABS Take 1 tablet by mouth 3 (three) times daily as needed (Headache).     Acetaminophen-Codeine 300-30 MG tablet Take 0.5-1 tablets by mouth every 6 (six) hours as needed for pain.     amLODipine (NORVASC) 2.5 MG tablet Take 2.5 mg by mouth daily.     aspirin 325 MG tablet Take 650 mg by mouth daily.     carvedilol (COREG) 6.25 MG tablet Take 1 tablet (6.25 mg total) by mouth 2 (two) times daily. 180 tablet 3   Continuous Blood Gluc Receiver (FREESTYLE LIBRE 2 READER) DEVI      Continuous Blood Gluc Sensor (FREESTYLE LIBRE 2 SENSOR) MISC      diclofenac (FLECTOR) 1.3 % PTCH Place 1 patch onto the skin 2 (two) times daily as needed (pain).     eplerenone (INSPRA) 50 MG tablet Take 50 mg by mouth daily.     Evolocumab (REPATHA SURECLICK) [id number] MG/ML SOAJ Inject 140 mg into the skin every 14 (fourteen) days. 2 mL 11   Galcanezumab-gnlm (EMGALITY) 120 MG/ML SOAJ Inject 120 mg into the skin every 30 (thirty) days.     lidocaine (LIDODERM) 5 % 1 patch daily as needed (pain).     LORazepam (ATIVAN) 1 MG tablet Take 1 tablet (1 mg total) by mouth 2 (two) times daily. 60 tablet 1   losartan (COZAAR) 50 MG tablet Take 50 mg by mouth 2 (two) times daily.     meclizine (ANTIVERT) 25 MG tablet Take 25 mg by mouth 3 (three) times daily as needed for dizziness.     SYNTHROID 50 MCG tablet Take 100 mcg by mouth daily before breakfast. Must take (2) 50 mg can not take blue pills     No current facility-administered medications for this visit.    Medication Side  Effects: none  Orders placed this visit:  No orders of the defined types were placed in this encounter.   Psychiatric Specialty Exam:  Review of Systems  Musculoskeletal:  Positive for joint swelling and myalgias.  Neurological:  Positive for weakness.   Blood pressure (!) 166/78, pulse 78, height 5\' 1"  (1.549 m), weight 106 lb (48.1 kg).Body mass index is 20.03 kg/m.  General Appearance: Casual and Neat  Eye Contact:  Good  Speech:  Clear and Coherent and Talkative  Volume:  Normal  Mood:  Anxious and Depressed  Affect:  Congruent, Depressed, Flat, Tearful, and Anxious  Thought Process:  Coherent  Orientation:  Full (Time, Place, and Person)  Thought Content: Logical   Suicidal Thoughts:  No  Homicidal Thoughts:  No  Memory:  WNL  Judgement:  Fair  Insight:  Fair  Psychomotor Activity:  Normal  Concentration:  Concentration: Good  Recall:  Good  Fund of Knowledge: Fair  Language: Good  Assets:  Desire for Improvement Physical Health Resilience Social Support  ADL's:  Intact  Cognition: WNL  Prognosis:  Fair   Screenings:  Flowsheet Row Admission (Discharged) from 05/14/2021 in [facility] No Risk       Receiving Psychotherapy: No   Treatment Plan/Recommendations:   Greater than 50% of 60 min face to face time with patient was spent on counseling and coordination of care. We discussed her concerns over anxiety since her husband passed on 06-05-07.  She has been utilizing benzodiazapine's since that time to control symptoms. I expressed my concerns about continuing this course due to advancing age. However, while  it would be reasonable to start reducing dose of Ativan, it would have to be controlled and slow due to risk with long term use and seizures. We did agree to start low dose Buspar to see if she tolerates and to assist in controlling anxiety. The goal is to slowly become less dependent on a benzo. She denies all depression  but it is apparent that she meets the criteria.  I feel that with her fixation on controlling her BP that this needs to be addressed promptly.  We agreed today: Will reduce Ativan to 1mg  twice daily as needed Discussed potential benefits, risks, and side effects of BuSpar.  Patient agrees to trial of BuSpar.  5 mg twice daily until next visit. Dosing 7-8 hours apart. To follow up in 4 weeks to reassess To follow up with Cardiologist about continue high BP and renal stenosis.  Discussed BP contributing to symptoms of anxiety.  Provided emergency contact information Reviewed PDMP    Elwanda [place], NP

## 2021-07-20 DIAGNOSIS — I639 Cerebral infarction, unspecified: Secondary | ICD-10-CM | POA: Insufficient documentation

## 2021-07-20 DIAGNOSIS — E789 Disorder of lipoprotein metabolism, unspecified: Secondary | ICD-10-CM | POA: Insufficient documentation

## 2021-07-20 DIAGNOSIS — I10 Essential (primary) hypertension: Secondary | ICD-10-CM

## 2021-07-20 HISTORY — DX: Essential (primary) hypertension: I10

## 2021-07-20 HISTORY — DX: Cerebral infarction, unspecified: I63.9

## 2021-07-20 HISTORY — DX: Disorder of lipoprotein metabolism, unspecified: E78.9

## 2021-07-22 ENCOUNTER — Encounter: Payer: Self-pay | Admitting: Cardiology

## 2021-07-27 ENCOUNTER — Telehealth: Payer: Self-pay | Admitting: Cardiology

## 2021-07-27 NOTE — Telephone Encounter (Signed)
Pt c/o medication issue:  1. Name of Medication: Evolocumab (REPATHA SURECLICK) 140 MG/ML SOAJ  2. How are you currently taking this medication (dosage and times per day)? Inject 140 mg into the skin every 14 (fourteen) days.  3. Are you having a reaction (difficulty breathing--STAT)? no  4. What is your medication issue? Patients states she needs a prior authorization to get a refill. Please advise  \

## 2021-07-28 ENCOUNTER — Telehealth: Payer: Self-pay | Admitting: *Deleted

## 2021-07-28 NOTE — Telephone Encounter (Signed)
[employer] for Prior Authorization Request has been approved for Repatha 140mg /ml, valid from 06/28/21 through 07/28/22

## 2021-07-28 NOTE — Telephone Encounter (Signed)
Prior Authorization started for Repatha  Brandy Flowers (Key: [id number]) Repatha SureClick 140MG /ML auto-injectors   Form [place] Form [phone] NCP[place])

## 2021-07-30 ENCOUNTER — Other Ambulatory Visit: Payer: Self-pay | Admitting: Behavioral Health

## 2021-07-30 DIAGNOSIS — F411 Generalized anxiety disorder: Secondary | ICD-10-CM

## 2021-08-05 ENCOUNTER — Encounter: Payer: Self-pay | Admitting: Behavioral Health

## 2021-08-05 ENCOUNTER — Ambulatory Visit (INDEPENDENT_AMBULATORY_CARE_PROVIDER_SITE_OTHER): Payer: Medicare Other | Admitting: Behavioral Health

## 2021-08-05 DIAGNOSIS — F411 Generalized anxiety disorder: Secondary | ICD-10-CM | POA: Diagnosis not present

## 2021-08-05 MED ORDER — LORAZEPAM 1 MG PO TABS: 1.0000 mg | ORAL_TABLET | Freq: Two times a day (BID) | ORAL | 1 refills | Status: DC

## 2021-08-05 NOTE — Progress Notes (Signed)
[facility]  Patient ID: Brandy Flowers,  MRN: [id number]  PCP: Lars Mage, NP  Date of Evaluation: 08/05/2021 Time spent:30 minutes  Chief Complaint:  Chief Complaint   Anxiety; Follow-up; Medication Problem; Medication Refill     HISTORY/CURRENT STATUS: HPI "Brandy Flowers", 81 year old female presents to this office for initial visit and to establish care.  No changes this visit. No complications from reducing benzodiazapine use. A friend of the family is present with her consents. Friend provides transportation and assist with medical appointments.  Her list of  reported allergy or undesirable side effect to medication is very lengthy and she is very fixated on her BP. She says that she will get a second opinion from a different cardiologist.  She is denying all depression but becomes tearful when talking about her health. Has negative MDQ with all criterion unremarkable. Negative PHQ-2. Says her depression is 0/10 but anxiety is 5/10. Says she does sleep 7 hours per night. She denies mania or psychosis. No delirium, no auditory or visual hallucinations reported. No SI/HI.    Prior psychiatric medications: Klonopin Ativan Clonidine-Headache, flu like symptoms Lexapro-brain fog       Individual Medical History/ Review of Systems: Changes? :No   Allergies: Amiloride, Blue dyes (parenteral), Inderal la [propranolol hcl], Sulfa antibiotics, Ace inhibitors, Norvasc [amlodipine], Calcium channel blockers, Catapres [clonidine hcl], Clonidine derivatives, Diovan [valsartan], Hydralazine, Metformin and related, Micardis [telmisartan], Minoxidil, Plavix [clopidogrel], Serotonin reuptake inhibitors (ssris), Sulfur, Topamax [topiramate], Tramadol, Tylenol with codeine #3 [acetaminophen-codeine], Zetia [ezetimibe], Amoxicillin, Chlorthalidone, Doxycycline, Fosamax [alendronate sodium], Lexapro [escitalopram], Spironolactone, and Statins  Current Medications:  Current Outpatient  Medications:    ACETAMINOPHEN-BUTALBITAL 50-325 MG TABS, Take 1 tablet by mouth 3 (three) times daily as needed (Headache)., Disp: , Rfl:    Acetaminophen-Codeine 300-30 MG tablet, Take 0.5-1 tablets by mouth every 6 (six) hours as needed for pain., Disp: , Rfl:    aspirin 325 MG tablet, Take 650 mg by mouth daily., Disp: , Rfl:    busPIRone (BUSPAR) 5 MG tablet, TAKE 1 TABLET BY MOUTH TWICE A DAY, Disp: 180 tablet, Rfl: 0   Continuous Blood Gluc Receiver (FREESTYLE LIBRE 2 READER) DEVI, , Disp: , Rfl:    Continuous Blood Gluc Sensor (FREESTYLE LIBRE 2 SENSOR) MISC, , Disp: , Rfl:    diclofenac (FLECTOR) 1.3 % PTCH, Place 1 patch onto the skin 2 (two) times daily as needed (pain)., Disp: , Rfl:    eplerenone (INSPRA) 50 MG tablet, Take 50 mg by mouth daily., Disp: , Rfl:    Evolocumab (REPATHA SURECLICK) 140 MG/ML SOAJ, Inject 140 mg into the skin every 14 (fourteen) days., Disp: 2 mL, Rfl: 11   Galcanezumab-gnlm (EMGALITY) 120 MG/ML SOAJ, Inject 120 mg into the skin every 30 (thirty) days., Disp: , Rfl:    lidocaine (LIDODERM) 5 %, 1 patch daily as needed (pain)., Disp: , Rfl:    LORazepam (ATIVAN) 1 MG tablet, Take 1 tablet (1 mg total) by mouth 2 (two) times daily., Disp: 60 tablet, Rfl: 1   losartan (COZAAR) 50 MG tablet, Take 50 mg by mouth 2 (two) times daily., Disp: , Rfl:    SYNTHROID 50 MCG tablet, Take 100 mcg by mouth daily before breakfast. Must take (2) 50 mg can not take blue pills, Disp: , Rfl:  Medication Side Effects: none  Family Medical/ Social History: Changes? No  MENTAL HEALTH EXAM:  There were no vitals taken for this visit.There is no height or weight on file to  calculate BMI.  General Appearance: Casual, Neat, and Well Groomed  Eye Contact:  Good  Speech:  Clear and Coherent  Volume:  Normal  Mood:  Anxious  Affect:  Anxious  Thought Process:  Coherent  Orientation:  Full (Time, Place, and Person)  Thought Content: Logical   Suicidal Thoughts:  No  Homicidal  Thoughts:  No  Memory:  WNL  Judgement:  Fair  Insight:  Good and Fair  Psychomotor Activity:  Normal  Concentration:  Concentration: Fair  Recall:  [employer] of Knowledge: Fair  Language: Fair  Assets:  Desire for Improvement Physical Health Resilience Social Support  ADL's:  Intact  Cognition: WNL  Prognosis:  Good    DIAGNOSES:    ICD-10-CM   1. Generalized anxiety disorder  F41.1 LORazepam (ATIVAN) 1 MG tablet      Receiving Psychotherapy: No    RECOMMENDATIONS:   Greater than 50% of 30 min face to face time with patient was spent on counseling and coordination of care. She stopped the Buspar 5 mg. She says that it made her feel sick and stopped after 3 days. She has an extensive list of medications she reports that she cannot take. She believes that if she can wean off the Ativan that she does not need another medication for anxiety. I explained that we should wean very slowly over months to avoid complications since she has been on benzo's for many years.   She denies all depression but it is apparent that she meets the criteria.  I feel that with her fixation on controlling her BP that this needs to be addressed promptly. Today her BP was 177/77, which it has been frequently elevated. I urged her to f/u with cardiologist or PCP with further concerns asap.  We agreed today: Will reduce Ativan to 1mg  twice daily as needed Discussed potential benefits, risks, and side effects of BuSpar. Patient stopped Buspar. To follow up in 3 months to reassess To follow up with Cardiologist about continue high BP and renal stenosis.  Discussed BP contributing to symptoms of anxiety.  Provided emergency contact information Reviewed PDMP     , NP

## 2021-08-06 ENCOUNTER — Ambulatory Visit: Payer: Medicare Other | Admitting: Podiatry

## 2021-08-06 ENCOUNTER — Encounter: Payer: Self-pay | Admitting: *Deleted

## 2021-08-11 ENCOUNTER — Encounter: Payer: Self-pay | Admitting: Neurology

## 2021-09-09 ENCOUNTER — Telehealth: Payer: Self-pay | Admitting: Behavioral Health

## 2021-09-09 NOTE — Telephone Encounter (Signed)
Noted will call in the morning

## 2021-09-09 NOTE — Telephone Encounter (Signed)
[name] @ [facility] LVM stating Pt PCP Devin Elsie Saas, NP would like to talk with Brandy Flowers to give some information about mutual Pt. RTC [phone]. If BW not available, will take with nurse.

## 2021-09-09 NOTE — Telephone Encounter (Signed)
If you get a chance could you please check on this one please. Thank you.

## 2021-09-10 ENCOUNTER — Telehealth: Payer: Self-pay | Admitting: Behavioral Health

## 2021-09-10 NOTE — Telephone Encounter (Signed)
Brandy Flowers called to see if we had called  [facility] about her Ativan. She said that She is getting more migraines since it was changed to 2/day. Her NP wants to have it changed to 3/day. Phone number is [phone]. She left a voicemail.

## 2021-09-10 NOTE — Telephone Encounter (Signed)
Okay thanks for update. I tried earlier to reach someone but wasn't successful. Will try again in the morning.

## 2021-09-10 NOTE — Telephone Encounter (Signed)
Wasn't able to get in touch with someone earlier. Will try back again. Sounds like its related to the decrease in her Ativan causing worsening headaches per pt. Will try again in the morning.

## 2021-09-11 ENCOUNTER — Other Ambulatory Visit: Payer: Self-pay | Admitting: Behavioral Health

## 2021-09-11 DIAGNOSIS — F411 Generalized anxiety disorder: Secondary | ICD-10-CM

## 2021-09-11 MED ORDER — LORAZEPAM 1 MG PO TABS: 1.0000 mg | ORAL_TABLET | Freq: Three times a day (TID) | ORAL | 1 refills | Status: DC | PRN

## 2021-09-11 NOTE — Progress Notes (Signed)
Returned call to Elsie Saas, NP patients PCP. We discussed the complexity of patient not responding to medications to control BP adequately. We concluded that trying to wean pt off benzo's may have increased her BP due to increased anxiety. Due to risk of certain medications because her advanced age and non response to other medication the benefit may outweigh the risk and decided to resume Ativan 1 mg three times daily. PCP will continue to monitor BP and we will collaborate at a later date to determine if beneficial. To reinforce with patient fall risk and mental fogginess that may be associated with a Benzo.

## 2021-09-11 NOTE — Telephone Encounter (Signed)
Here is who to contact

## 2021-09-11 NOTE — Telephone Encounter (Signed)
Contact Brandy Saas, NP directly. Agreed to increase Ativan for patient. Could you please contact patient and explain that we have coordinated with he PCP and will be increasing back her Ativan to 1 mg three times daily to see how this effects BP.  Thank you.

## 2021-09-11 NOTE — Telephone Encounter (Signed)
[facility] and asked to speak with NP but they were insistent that she speak with Arlys John instead. Informed them Arlys John was with a pt but they were still insistent he needed to speak with them. The previous phone message said it was okay to speak with nurse but that was not the case.  Arlys John is aware and will call back regarding pt.

## 2021-09-11 NOTE — Telephone Encounter (Signed)
Pt notified and will call with further concerns

## 2021-09-17 ENCOUNTER — Encounter: Payer: Self-pay | Admitting: Family Medicine

## 2021-09-17 ENCOUNTER — Other Ambulatory Visit: Payer: Self-pay | Admitting: Family Medicine

## 2021-09-17 ENCOUNTER — Ambulatory Visit (INDEPENDENT_AMBULATORY_CARE_PROVIDER_SITE_OTHER): Payer: Medicare Other | Admitting: Family Medicine

## 2021-09-17 VITALS — BP 189/76 | HR 67 | Ht 61.0 in | Wt 105.0 lb

## 2021-09-17 DIAGNOSIS — R519 Headache, unspecified: Secondary | ICD-10-CM

## 2021-09-17 DIAGNOSIS — G8929 Other chronic pain: Secondary | ICD-10-CM | POA: Diagnosis not present

## 2021-09-17 MED ORDER — UBRELVY 100 MG PO TABS: 100.0000 mg | ORAL_TABLET | Freq: Every day | ORAL | 11 refills | Status: DC | PRN

## 2021-09-17 NOTE — Progress Notes (Signed)
Chief Complaint  Patient presents with   Follow-up    RM 1. Last seen 06/11/21.  Has nausea, dizziness, rash (using Flector patch on left leg). Unsure if Emgality helping. Took fioricet at 6am today for headache and ASA. Feels she is having issues from eplerenone. Cardiology said BP not managed by them.     HISTORY OF PRESENT ILLNESS:  09/17/21 ALL:  Brandy Flowers is a 81 y.o. female here today for follow up for migraines. 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 She had samples of Ubrelvy for abortive therapy but did not pick up her rx. Per Epic, it was approved. She reports daily headaches. Most all are migrainous. She has been taking butalbital 2-3 times a week that gives her some relief. She admits that BP is usually around 170/80 at home. She has been working with her psychiatrist and reports he has asked her to wean Ativan from TID to BID. She feels this dose change has contributed to a worsening headache over the past few days. She reports seeing PCP last week. It is unclear on what advise she was given at that time regarding BP. She is requesting that I give her hydralazine used during her MRI in 04/2021.    Five Ubrelvy 100mg  tablets given in office LOT: Exp:10/17/2023  medications tried include: Emgality (on now), toppiramate, atenolol, amlodipine, Lexapro, losartan, amitriptyline, gabapentin, Ubrelvy (on now), aspirin, meclizine, Fioricet, Tylenol, Mobic, Zofran, prednisone, Compazine injections, scopolamine patches, tizanidine  HISTORY (copied from Dr 12/17/2023 previous note)  06/11/2021: She is significantly improved on the Emgality. Headaches are gone. She has not had a headache in 2 weeks. Dr. 06/13/2021 is her new cardiologist and he changed atenolol to coreg and her headaches returned, she took the atenolol and the headache improved. She also has a new pcp Brandy Flowers and patient is very happy. She gets Emgality from Chief of Staff, expires in June needs to be extended for medical necessity.  She spoke to [place] and ave her the number we are aware.    April 06, 2021: Patient states she has less headaches but she is still having them, they can be bowel moderate to severe, she still perseverates on medication even though Dr. April 08, 2021 took her off olmesartan she said the new medication made her dizzy the coming off of it increased her blood pressure to 180, I had not know why she started having headaches, CT of the head in November 2022 showed small vessel disease but nothing acute reviewed images, she had an MRI in 2020 which showed No acute abnormality, Atrophy and mild chronic ischemic change including the pons, I reviewed that as well.  The prior year she had an MRI 12/2017 with Worsening brainstem infarction bilateral pons. We discussed. May recommend MRI brain and MRV of the head. We discussed all above.    Patient complains of symptoms per HPI as well as the following symptoms: headache . Pertinent negatives and positives per HPI. All others negative   02/12/2021: Patient perseverates on medications causing her headaches.  I have had contact with Dr. 02/14/2021 who does not believe that her medications are playing a role.  We tried [name] for her as she just wanted to start something acutely and not preventatively.  She continued to have migrainous headaches.  We got [name] approved. We got emgality approved. She is getting her first injections with training today. She reports unilateral migraines worsening > 3 months, pulsating/pounding/throbbing with nausea and light sensitivity, lasting 4-24 hours, movement makes it worse.  She is on Technical sales engineer and ubrelvy as needed.  Just here for a nurse viti to inject.   HPI:  Brandy Flowers is a 81 y.o. female here as requested by Brandy Mage, NP for headaches. Pmhx diabetes, headaches, COPD, statin myopathy, thyroid disease, stroke, migraines, hypothyroidism, hyperlipidemia, arthritis, anxiety, sinus problems, renal artery stenosis,  hyponatremia, labile blood pressure, pontine cerebrovascular accident, oropharyngeal dysphagia, hypertension.  I reviewed Dr. Geoffery Flowers notes: Patient sees a chiropractor, she has neck problems with dizziness and headaches.  I reviewed Epic notes, Patient was seen by Dr. Anne Hahn here in the office in December 2020 after stroke in January 03, 2018, she presented with generalized weakness and slurred speech that was acute in onset, at the time she was walking with a cane, she returned to the hospital on December 26, 2018 with vertigo that was positional in nature, MRI of the brain done at that time showed no acute changes, she started having headaches sometime in late summer 2020, but at the time she saw Dr. Anne Hahn on February 08, 2019 she states that the headaches had resolved the last 2 months after stopping a medication with a diet and at that she thinks she was allergic to or sensitive to.  She also complained of some low back pain and some occasional right leg pain that comes and goes, history of restless leg syndrome, she has not been able to tolerate statins in the past.   Headaches are In the temples and feels like her whole head is pressure, pulsating, nausea, not much light sensitivity. Codeine helps but makes her dizzy. Headaches started after the olmesartan and inspra. Not pulsating/pounding. Not photophobia or phonophobia, some mild nausea. Dramamine helps with the nausea but not dizziness. Sometimes unilateral, she is having headaches daily. Sometimes she wakes up with them. She is not excessively fatigued during the day, may feel a little weak. When she gets headaches she check blood pressure. Stopped taking the butalbital. She is taking a codeine. Laying down helps. Dizziness is from the codeine, no vision changes. Ongoing over a year, worsening, more frequent, some days they can be severe but other days mild, usually doesn't wake up in the morning with them but develops during the day, between 4-14  total headache days a month     Reviewed notes, labs and imaging from outside physicians, which showed:   Recent blood work(from referrng provider notes) includes normal TSH and T4, LDL 64, CBC normal, CMP with elevated glucose 128 otherwise normal, hemoglobin A1c 6.3.   From a thorough review of records, medications tried that can be used in migraine management include aspirin, meclizine, Fioricet, atenolol, gabapentin, Tylenol, amlodipine, aspirin, atenolol, Lexapro, losartan, meclizine, Mobic, Zofran, prednisone, Compazine injections, scopolamine patches, tizanidine, topiramate, amitriptyline   MRI brain 12/2018: FINDINGS: personally reviewed images and agree Brain: Negative for acute infarct. 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 No significant white matter ischemia. Negative for hemorrhage or mass. Mild atrophy without hydrocephalus.   Vascular: Normal arterial flow voids   Skull and upper cervical spine: Negative   Sinuses/Orbits: Mild mucosal edema paranasal sinuses. Bilateral cataract surgery   Other: None   IMPRESSION: No acute abnormality   Atrophy and mild chronic ischemic change including the pons.   REVIEW OF SYSTEMS: Out of a complete 14 system review of symptoms, the patient complains only of the following symptoms, headaches, chronic pain, anxiety, depression and all other reviewed systems are negative.   ALLERGIES: Allergies  Allergen Reactions   Amiloride Shortness  Of Breath   Blue Dyes (Parenteral) Shortness Of Breath, Rash and Other (See Comments)    Joint pain   Inderal La [Propranolol Hcl] Shortness Of Breath   Other Other (See Comments), Rash and Shortness Of Breath    Joint pain   Sulfa Antibiotics Anaphylaxis   Ace Inhibitors Nausea And Vomiting   Norvasc [Amlodipine] Nausea And Vomiting    Taking at this time. No problems   Calcium Channel Blockers Diarrhea   Catapres [Clonidine Hcl]     Decreased mental clarity    Clonidine Derivatives Other  (See Comments)    Headache, flu like symptoms   Diovan [Valsartan] Cough   Hydralazine Other (See Comments)    Headache   Metformin And Related Diarrhea   Micardis [Telmisartan] Other (See Comments)    Hallucinations, increase in BP   Minoxidil Other (See Comments)    Fast heart rate, chest pain   Plavix [Clopidogrel]     Increased LFT's    Serotonin Reuptake Inhibitors (Ssris) Other (See Comments)    Headache   Sulfur    Topamax [Topiramate] Other (See Comments)    Confusion   Tramadol     Nausea as a dog per pt   Tylenol With Codeine #3 [Acetaminophen-Codeine] Other (See Comments)    Lightheaded   Zetia [Ezetimibe]     Liver  Function increase labs   Amoxicillin Diarrhea   Chlorthalidone     Dizziness.    Doxycycline Diarrhea   Fosamax [Alendronate Sodium]     Pt's teeth fell out while taking.    Lexapro [Escitalopram]     Brain Fog    Spironolactone Palpitations   Statins     Upset stomach      HOME MEDICATIONS: Outpatient Medications Prior to Visit  Medication Sig Dispense Refill   ACETAMINOPHEN-BUTALBITAL 50-325 MG TABS Take 1 tablet by mouth 3 (three) times daily as needed (Headache).     acetaminophen-codeine (TYLENOL #3) 300-30 MG tablet Take 1 tablet by mouth as needed.     Acetaminophen-Codeine 300-30 MG tablet Take 0.5-1 tablets by mouth every 6 (six) hours as needed for pain.     aspirin 325 MG tablet Take 650 mg by mouth daily.     atenolol (TENORMIN) 50 MG tablet Take 50 mg by mouth in the morning and at bedtime.     Continuous Blood Gluc Receiver (FREESTYLE LIBRE 2 READER) DEVI      Continuous Blood Gluc Sensor (FREESTYLE LIBRE 2 SENSOR) MISC      diclofenac (FLECTOR) 1.3 % PTCH Place 1 patch onto the skin 2 (two) times daily as needed (pain).     eplerenone (INSPRA) 50 MG tablet Take 50 mg by mouth daily.     Evolocumab (REPATHA SURECLICK) 140 MG/ML SOAJ Inject 140 mg into the skin every 14 (fourteen) days. 2 mL 11   Galcanezumab-gnlm (EMGALITY) 120  MG/ML SOAJ Inject 120 mg into the skin every 30 (thirty) days.     lidocaine (LIDODERM) 5 % 1 patch daily as needed (pain).     LORazepam (ATIVAN) 1 MG tablet Take 1 tablet (1 mg total) by mouth every 8 (eight) hours as needed for anxiety. 90 tablet 1   losartan (COZAAR) 50 MG tablet Take 50 mg by mouth 2 (two) times daily.     SYNTHROID 50 MCG tablet Take 100 mcg by mouth daily before breakfast. Must take (2) 50 mg can not take blue pills     No facility-administered medications prior to visit.  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%   Sigmoid diverticulosis    Sinus problem    Splenic artery aneurysm (HCC)    7mm   Stroke (HCC) 12/16/2017   Thyroid disease      PAST SURGICAL HISTORY: Past Surgical History:  Procedure Laterality Date   BREAST REDUCTION SURGERY  2007   CATARACT EXTRACTION Bilateral    CHOLECYSTECTOMY     COLONOSCOPY     RADIOLOGY WITH ANESTHESIA N/A 05/14/2021   Procedure: MRI BRAIN WITH AND WITHOUT CONTRAST WITH ANESTHESIA;  Surgeon: Radiologist, Medication, MD;  Location: [facility] OR;  Service: Radiology;  Laterality: N/A;   RADIOLOGY WITH ANESTHESIA N/A 05/14/2021   Procedure: MRV WITHOUT CONTRAST WITH ANESTHESIA;  Surgeon: Radiologist, Medication, MD;  Location: [facility] OR;  Service: Radiology;  Laterality: N/A;   TONSILLECTOMY AND ADENOIDECTOMY     TOOTH EXTRACTION  2019   with anesthesia   WISDOM TOOTH EXTRACTION       FAMILY HISTORY: Family History  Problem Relation Age of Onset   Stroke Mother    Diabetes Mother    Heart disease Mother        Before age 860   Hypertension Mother    Hyperlipidemia Mother    Heart disease Father    Heart attack  Father    Hypertension Father    Heart disease Brother        After age 80- CABG   Hypertension Brother    Migraines Neg Hx      SOCIAL HISTORY: Social History   Socioeconomic History   Marital status: Widowed    Spouse name: Not on file   Number of children: Not on file   Years of education: Not on file   Highest education level: Not on file  Occupational History   Not on file  Tobacco Use   Smoking status: Never   Smokeless tobacco: Never  Vaping Use   Vaping Use: Never used  Substance and Sexual Activity   Alcohol use: No   Drug use: No   Sexual activity: Not Currently    Birth control/protection: Post-menopausal  Other Topics Concern   Not on file  Social History Narrative   Lives at home with her dog    Right handed   Caffeine: maybe 1 cup/day   Social Determinants of Health   Financial Resource Strain: Low Risk  (12/18/2017)   Overall Financial Resource Strain (CARDIA)    Difficulty of Paying Living Expenses: Not very hard  Food Insecurity: Unknown (12/18/2017)   Hunger Vital Sign    Worried About Running Out of Food in the Last Year: Patient refused    Ran Out of Food in the Last Year: Patient refused  Transportation Needs: Unknown (12/18/2017)   PRAPARE - Transportation    Lack of Transportation (Medical): Patient refused    Lack of Transportation (Non-Medical): Patient refused  Physical Activity: Unknown (12/18/2017)   Exercise Vital Sign    Days of Exercise per Week: Patient refused    Minutes of Exercise per Session: Patient refused  Stress: No Stress Concern Present (12/18/2017)  [employer] of Occupational Health - Occupational Stress Questionnaire    Feeling of Stress : Not at all  Social Connections: Unknown (12/18/2017)   Social Connection and Isolation Panel [NHANES]    Frequency of Communication with Friends and Family: Patient refused    Frequency of Social Gatherings with Friends and Family: Patient refused    Attends Religious  Services: Patient refused    Active Member of Clubs or Organizations: Patient refused    Attends Banker Meetings: Patient refused    Marital Status: Patient refused  Intimate Partner Violence: Unknown (12/18/2017)   Humiliation, Afraid, Rape, and Kick questionnaire    Fear of Current or Ex-Partner: Patient refused    Emotionally Abused: Patient refused    Physically Abused: Patient refused    Sexually Abused: Patient refused     PHYSICAL EXAM  Vitals:   09/17/21 1252  BP: (!) 189/76  Pulse: 67  Weight: 105 lb (47.6 kg)  Height: 5\' 1"  (1.549 m)   Body mass index is 19.84 kg/m.  Generalized: Well developed, in no acute distress  Cardiology: normal rate and rhythm, no murmur auscultated  Respiratory: clear to auscultation bilaterally    Neurological examination  Mentation: Alert oriented to time, place, history taking. Follows all commands speech and language fluent Cranial nerve II-XII: Pupils were equal round reactive to light. Extraocular movements were full, visual field were full on confrontational test. Facial sensation and strength were normal. Head turning and shoulder shrug  were normal and symmetric. Motor: The motor testing reveals 4 over 5 strength of all 4 extremities. Good symmetric motor tone is noted throughout.  Gait and station: Needs assistance to push to standing position. Gait is arthritic, steady with four prong cane    DIAGNOSTIC DATA (LABS, IMAGING, TESTING) - I reviewed patient records, labs, notes, testing and imaging myself where available.  Lab Results  Component Value Date   WBC 8.4 04/09/2021   HGB 14.9 04/09/2021   HCT 44.7 04/09/2021   MCV 88 04/09/2021   PLT 258 04/09/2021      Component Value Date/Time   NA 139 05/14/2021 0855   NA 139 04/09/2021 1156   K 3.4 (L) 05/14/2021 0855   CL 103 05/14/2021 0855   CO2 26 05/14/2021 0855   GLUCOSE 204 (H) 05/14/2021 0855   BUN 17 05/14/2021 0855   BUN 21 04/09/2021 1156    CREATININE 0.68 05/14/2021 0855   CALCIUM 9.6 05/14/2021 0855   PROT 6.9 12/26/2018 1552   ALBUMIN 3.8 12/26/2018 1552   AST 24 12/26/2018 1552   ALT 22 12/26/2018 1552   ALKPHOS 60 12/26/2018 1552   BILITOT 1.0 12/26/2018 1552   GFRNONAA >60 05/14/2021 0855   GFRAA >60 12/26/2018 1420   Lab Results  Component Value Date   CHOL 205 (H) 12/18/2017   HDL 41 12/18/2017   LDLCALC 139 (H) 12/18/2017   TRIG 124 12/18/2017   CHOLHDL 5.0 12/18/2017   Lab Results  Component Value Date   HGBA1C 6.5 (H) 12/18/2017   No results found for: "VITAMINB12" No results found for: "TSH"      No data to display               No data to display           ASSESSMENT AND PLAN  81 y.o. year old female  has a past medical history of Anxiety, Arthritis, COPD with exacerbation (HCC), Diabetes mellitus without complication (HCC), Diastolic dysfunction, Hyperlipidemia, Hypertension, Hypothyroidism,  Migraines, Renal artery stenosis (HCC), Sigmoid diverticulosis, Sinus problem, Splenic artery aneurysm [facility]), Stroke (HCC) (12/16/2017), and Thyroid disease. here with    Chronic intractable headache, unspecified headache type  Eugene Garnet reports daily migrainous headaches. Blood pressures have been quite elevated at home. Per notes, this seems to be an ongoing concern. We have discussed limitations in managing migraines if blood presure is uncontrolled. She was encouraged to call her PCP asap to discuss. We will continue Emgality every month. I will resubmit rx for Ubrelvy. I have provided 5 sample tablets in the office, today. She was advised on appropriate usage and advised to avoid taking butalbital regularly. She may also discuss anxiety management with psychiatry. Healthy lifestyle habits encouraged. She will follow up with PCP as directed. She will return to see me in 4 months, sooner if needed. She verbalizes understanding and agreement with this plan.   No orders of the defined types were  placed in this encounter.    Meds ordered this encounter  Medications   Ubrogepant (UBRELVY) 100 MG TABS    Sig: Take 100 mg by mouth daily as needed. Take one tablet at onset of headache, may repeat 1 tablet in 2 hours, no more than 2 tablets in 24 hours    Dispense:  8 tablet    Refill:  11    Order Specific Question:   Supervising Provider    Answer:   [name] [[id number]]     Shawnie Dapper, MSN, FNP-C 09/17/2021, 2:16 PM  [facility] [address] [place], [place] [address] [phone]

## 2021-09-17 NOTE — Patient Instructions (Addendum)
Below is our plan:  We will continue Emgality every month. Please try to stay away from butalbital. I will get you a new prescription of Ubrelvy. Please take 1 tablet at onset of headache. May take 1 additional tablet in 2 hours if needed. Do not take more than 2 tablets in 24 hours or more than 8 in a month.   Please take to [facility] ASAP about blood pressure readings. It is way to high!   Please make sure you are staying well hydrated. I recommend 50-60 ounces daily. Well balanced diet and regular exercise encouraged. Consistent sleep schedule with 6-8 hours recommended.   Please continue follow up with care team as directed.   Follow up with me in 4 months   You may receive a survey regarding today's visit. I encourage you to leave honest feed back as I do use this information to improve patient care. Thank you for seeing me today!

## 2021-09-17 NOTE — Telephone Encounter (Signed)
Spoke to patient Per [name],NP Patient BP is elevated please call PCP ASAP for elevated BP . After patient gets a BP under control we will work on headaches . Pt expressed understanding . Pt has a appointment with PCP tomorrow due to elevated BP . [name],NP gave her samples of ubrevly which patient states she will take PRN and continue with her Emgality monthly.Pt has a plan going forward . Pt thanked me for calling her back

## 2021-09-23 NOTE — Addendum Note (Signed)
Addended by: Bertram Savin on: 09/23/2021 03:36 PM   Modules accepted: Orders

## 2021-10-12 NOTE — Addendum Note (Signed)
Addended by: Bertram Savin on: 10/12/2021 11:00 AM   Modules accepted: Orders

## 2021-10-13 ENCOUNTER — Encounter: Payer: Self-pay | Admitting: Neurology

## 2021-10-13 NOTE — Telephone Encounter (Signed)
Received this message from the patient in another mychart encounter:  Brandy Flowers  P Gna Clinical Pool (supporting [name], MD) 1 hour ago (10:10 AM)    the Eplerenone does reduce my blood pressure.    Brandy Flowers  P Gna Clinical Pool (supporting [name], MD) 1 hour ago (10:08 AM)    I figured out that Eplerenone is causing dizzy and diarrhea.  my PCP prescribed Meclizine which does help. also, I can take Imodium for the diarrhea.  so, for now I will stay on the medications as prescribed. I also take the Emgality on the 13th of each month and I think I helps with the Migraines.

## 2021-10-14 ENCOUNTER — Encounter: Payer: Self-pay | Admitting: *Deleted

## 2021-10-23 ENCOUNTER — Telehealth: Payer: Self-pay | Admitting: Behavioral Health

## 2021-10-23 ENCOUNTER — Other Ambulatory Visit: Payer: Self-pay

## 2021-10-23 MED ORDER — LORAZEPAM 0.5 MG PO TABS: 1.0000 mg | ORAL_TABLET | Freq: Three times a day (TID) | ORAL | 0 refills | Status: DC

## 2021-10-23 NOTE — Telephone Encounter (Signed)
Patient lvm stating [facility] is out of the Ativan for her refill. She stated she currently takes 1 mg three times a day, and will be out on tomorrow. Please advise.  Contact information # [phone]  Follow up appointment 11/05/21

## 2021-10-23 NOTE — Telephone Encounter (Signed)
Pended 0.5 mg to pharmacy

## 2021-11-02 ENCOUNTER — Encounter: Payer: Self-pay | Admitting: Neurology

## 2021-11-05 ENCOUNTER — Ambulatory Visit: Payer: Medicare Other | Admitting: Behavioral Health

## 2021-11-12 ENCOUNTER — Telehealth: Payer: Self-pay | Admitting: *Deleted

## 2021-11-12 NOTE — Telephone Encounter (Signed)
Diabetes Communication Report.  No Diabetic retinopathy was detected.  No evidence of macular edema.  Plan:  continue to monitor current diabetes care.  Follow up in one year.  11-11-2027 [phone],  fax  [phone].

## 2021-11-16 ENCOUNTER — Encounter: Payer: Self-pay | Admitting: Neurology

## 2021-11-19 ENCOUNTER — Encounter: Payer: Self-pay | Admitting: Neurology

## 2021-11-19 ENCOUNTER — Ambulatory Visit (INDEPENDENT_AMBULATORY_CARE_PROVIDER_SITE_OTHER): Payer: Medicare Other | Admitting: Behavioral Health

## 2021-11-19 ENCOUNTER — Encounter: Payer: Self-pay | Admitting: Behavioral Health

## 2021-11-19 DIAGNOSIS — F411 Generalized anxiety disorder: Secondary | ICD-10-CM | POA: Diagnosis not present

## 2021-11-19 MED ORDER — LORAZEPAM 0.5 MG PO TABS: 0.5000 mg | ORAL_TABLET | Freq: Two times a day (BID) | ORAL | 1 refills | Status: DC

## 2021-11-19 NOTE — Telephone Encounter (Signed)
Checked pts chart.  Emgality, [place], I see in note triptans contrindicated 12-2020 note.

## 2021-11-19 NOTE — Progress Notes (Signed)
[facility]  Patient ID: Brandy Flowers,  MRN: [id number]  PCP: Lars Mage, NP  Date of Evaluation: 11/19/2021 Time spent:30 minutes  Chief Complaint:  Chief Complaint   Anxiety; Medication Refill; Patient Education; Stress; Follow-up     HISTORY/CURRENT STATUS: HPI  "Sarin", 81 year old female presents to this office for initial visit and to establish care.  No changes this visit. No complications from reducing benzodiazapine use. A friend of the family is present with her consents. Friend provides transportation and assist with medical appointments.  She is denying all depression but becomes tearful when talking about her health. . Negative PHQ-2. Says her depression is 0/10 but anxiety is 3/10. Says she does sleep 7 hours per night. She denies mania or psychosis. No delirium, no auditory or visual hallucinations reported. No SI/HI.    Prior psychiatric medications: Klonopin Ativan Clonidine-Headache, flu like symptoms Lexapro-brain fog   Individual Medical History/ Review of Systems: Changes? :No   Allergies: Amiloride, Blue dyes (parenteral), Inderal la [propranolol hcl], Other, Sulfa antibiotics, Ace inhibitors, Norvasc [amlodipine], Calcium channel blockers, Catapres [clonidine hcl], Clonidine derivatives, Diovan [valsartan], Hydralazine, Metformin and related, Micardis [telmisartan], Minoxidil, Olmesartan, Plavix [clopidogrel], Serotonin reuptake inhibitors (ssris), Sulfur, Topamax [topiramate], Tramadol, Tylenol with codeine #3 [acetaminophen-codeine], Zetia [ezetimibe], Amoxicillin, Chlorthalidone, Doxycycline, Fosamax [alendronate sodium], Lexapro [escitalopram], Spironolactone, and Statins  Current Medications:  Current Outpatient Medications:    ACETAMINOPHEN-BUTALBITAL 50-325 MG TABS, Take 1 tablet by mouth 3 (three) times daily as needed (Headache)., Disp: , Rfl:    acetaminophen-codeine (TYLENOL #3) 300-30 MG tablet, Take 1 tablet by mouth as  needed., Disp: , Rfl:    Acetaminophen-Codeine 300-30 MG tablet, Take 0.5-1 tablets by mouth every 6 (six) hours as needed for pain., Disp: , Rfl:    aspirin 325 MG tablet, Take 650 mg by mouth daily., Disp: , Rfl:    atenolol (TENORMIN) 50 MG tablet, Take 50 mg by mouth in the morning and at bedtime., Disp: , Rfl:    Continuous Blood Gluc Receiver (FREESTYLE LIBRE 2 READER) DEVI, , Disp: , Rfl:    Continuous Blood Gluc Sensor (FREESTYLE LIBRE 2 SENSOR) MISC, , Disp: , Rfl:    diclofenac (FLECTOR) 1.3 % PTCH, Place 1 patch onto the skin 2 (two) times daily as needed (pain)., Disp: , Rfl:    Evolocumab (REPATHA SURECLICK) 140 MG/ML SOAJ, Inject 140 mg into the skin every 14 (fourteen) days., Disp: 2 mL, Rfl: 11   Galcanezumab-gnlm (EMGALITY) 120 MG/ML SOAJ, Inject 120 mg into the skin every 30 (thirty) days., Disp: , Rfl:    lidocaine (LIDODERM) 5 %, 1 patch daily as needed (pain)., Disp: , Rfl:    LORazepam (ATIVAN) 0.5 MG tablet, Take 1 tablet (0.5 mg total) by mouth 2 (two) times daily., Disp: 180 tablet, Rfl: 1   losartan (COZAAR) 50 MG tablet, Take 50 mg by mouth 2 (two) times daily., Disp: , Rfl:    SYNTHROID 50 MCG tablet, Take 100 mcg by mouth daily before breakfast. Must take (2) 50 mg can not take blue pills, Disp: , Rfl:    Ubrogepant (UBRELVY) 100 MG TABS, Take 100 mg by mouth daily as needed. Take one tablet at onset of headache, may repeat 1 tablet in 2 hours, no more than 2 tablets in 24 hours, Disp: 8 tablet, Rfl: 11 Medication Side Effects: none  Family Medical/ Social History: Changes? No  MENTAL HEALTH EXAM:  There were no vitals taken for this visit.There is no height or weight  on file to calculate BMI.  General Appearance: Casual, Neat, and Well Groomed  Eye Contact:  Good  Speech:  Clear and Coherent  Volume:  Normal  Mood:  Anxious and Depressed  Affect:  Congruent  Thought Process:  Coherent  Orientation:  Full (Time, Place, and Person)  Thought Content: Logical    Suicidal Thoughts:  No  Homicidal Thoughts:  No  Memory:  WNL  Judgement:  Good  Insight:  Good  Psychomotor Activity:  Normal  Concentration:  Concentration: Good  Recall:  Good  Fund of Knowledge: Good  Language: Good  Assets:  Desire for Improvement  ADL's:  Intact  Cognition: WNL  Prognosis:  Good    DIAGNOSES:    ICD-10-CM   1. Generalized anxiety disorder  F41.1 LORazepam (ATIVAN) 0.5 MG tablet      Receiving Psychotherapy: No    RECOMMENDATIONS:     Greater than 50% of 30 min face to face time with patient was spent on counseling and coordination of care. We discuss her continue concerns over her medical health issues. She continues to follow up with PCP regularly   She denies all depression but it is apparent that she meets the criteria.  I counseled her again today about being very careful when using benzo and not taking same time with pain medication. She reports fall one week ago hitting her head. I did not note any visible lesion, or hematoma. She did not seek medical attention. I recommended she follow up with PCP ASAP. We agreed today: Will reduce Ativan to 0.5 mg twice daily as needed Discussed potential benefits, risks, and side effects of  To follow up in 6 months to reassess. Provided emergency contact information Reviewed PDMP     Elwanda [place], NP

## 2021-11-24 ENCOUNTER — Encounter: Payer: Self-pay | Admitting: Neurology

## 2021-11-29 NOTE — Progress Notes (Unsigned)
Cardiology Office Note:    Date:  11/30/2021   ID:  Brandy Flowers, DOB December 23, 1940, MRN [id number]  PCP:  Ricard Dillon, NP  Cardiologist:  Shirlee More, MD    Referring MD: Ricard Dillon, NP    ASSESSMENT:    1. Essential hypertension   2. Renal artery stenosis ([place])   3. Hyperlipidemia, unspecified hyperlipidemia type    PLAN:    In order of problems listed above:  Certainly see individuals with office-based or whitecoat hypertension but rarely do I see a change of this magnitude.  She has a caregiver with her she is using a validated device good technique and I think her best to continue to follow her home blood pressures and continue her current antihypertensive regimen of beta-blocker and ARB especially in view of her multiple drug intolerances. Not on flow-limiting would not benefit from intervention for renal artery Continue her Repatha await labs from PCP office   Next appointment: 9 months   Medication Adjustments/Labs and Tests Ordered: Current medicines are reviewed at length with the patient today.  Concerns regarding medicines are outlined above.  Orders Placed This Encounter  Procedures   EKG 12-Lead   No orders of the defined types were placed in this encounter.   Chief Complaint  Patient presents with   Follow-up   Hypertension    History of Present Illness:    Brandy Flowers is a 81 y.o. female with a hx of renal artery stenosis with poorly controlled hypertension hyperlipidemia and statin intolerance last seen 06/02/2021 with many barriers to effective antihypertensive therapy.  Her medications were optimized making and transition from atenolol to carvedilol and education and using a validated upper extremity blood pressure device for home measurements.  She had previous testing including a carotid duplex at [facility] 04/08/2021 which showed no flow-limiting stenosis in the extracranial circulation engine echocardiogram done at [facility] which showed normal ejection fraction left atrial enlargement trace to mild aortic regurgitation mild-moderate mitral regurgitation the indication was stroke and hypertension.   Compliance with diet, lifestyle and medications: Yes  I am hard pressed to explain how but she uses a validated device good technique trends blood pressure at home that runs in the range of [id number] to 0000000 systolic.  Unfortunately she did not bring a list of blood pressures with her She is severely hypertensive in my office He has multiple drug intolerances including sulfa-based diuretics She is having no edema shortness of breath chest pain palpitation or syncope While in the office her systolic blood pressure decreased from 200-1 76.  She had a renal artery duplex performed 06/02/2021 showing less than 60% stenosis the left renal artery kidneys were normal in size and abnormal resistive index consistent with chronic medical kidney disease. 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   Stroke ([place]) 12/16/2017   Thyroid disease     Past Surgical History:  Procedure Laterality Date   BREAST REDUCTION SURGERY  2007   CATARACT EXTRACTION  Bilateral    CHOLECYSTECTOMY     COLONOSCOPY     RADIOLOGY WITH ANESTHESIA N/A 05/14/2021   Procedure: MRI BRAIN WITH AND WITHOUT CONTRAST WITH ANESTHESIA;  Surgeon: Radiologist, Medication, MD;  Location: [facility];  Service: Radiology;  Laterality: N/A;   RADIOLOGY WITH ANESTHESIA N/A 05/14/2021   Procedure: MRV WITHOUT CONTRAST WITH ANESTHESIA;  Surgeon: Radiologist, Medication, MD;  Location: [facility];  Service:  Radiology;  Laterality: N/A;   TONSILLECTOMY AND ADENOIDECTOMY     TOOTH EXTRACTION  2019   with anesthesia   WISDOM TOOTH EXTRACTION      Current Medications: Current Meds  Medication Sig   aspirin 325 MG tablet Take 650 mg by mouth daily.   atenolol (TENORMIN) 50 MG tablet Take 50 mg by mouth in the morning and at bedtime.   Continuous Blood Gluc Receiver (FREESTYLE LIBRE 2 READER) DEVI    Continuous Blood Gluc Sensor (FREESTYLE LIBRE 2 SENSOR) MISC    diclofenac (FLECTOR) 1.3 % PTCH Place 1 patch onto the skin 2 (two) times daily as needed (pain).   Evolocumab (REPATHA SURECLICK) 937 MG/ML SOAJ Inject 140 mg into the skin every 14 (fourteen) days.   Galcanezumab-gnlm (EMGALITY) 120 MG/ML SOAJ Inject 120 mg into the skin every 30 (thirty) days.   lidocaine (LIDODERM) 5 % 1 patch daily as needed (pain).   LORazepam (ATIVAN) 0.5 MG tablet Take 1 tablet (0.5 mg total) by mouth 2 (two) times daily.   losartan (COZAAR) 50 MG tablet Take 50 mg by mouth 2 (two) times daily.   SYNTHROID 50 MCG tablet Take 100 mcg by mouth daily before breakfast. Must take (2) 50 mg can not take blue pills     Allergies:   Amiloride, Blue dyes (parenteral), Inderal la [propranolol hcl], Other, Sulfa antibiotics, Ace inhibitors, Norvasc [amlodipine], Calcium channel blockers, Catapres [clonidine hcl], Clonidine derivatives, Diovan [valsartan], Hydralazine, Metformin and related, Micardis [telmisartan], Minoxidil, Olmesartan, Plavix [clopidogrel], Serotonin reuptake inhibitors (ssris), Sulfur, Topamax [topiramate], Tramadol, Tylenol with codeine #3 [acetaminophen-codeine], Zetia [ezetimibe], Amoxicillin, Chlorthalidone, Doxycycline, Fosamax [alendronate sodium], Lexapro [escitalopram], Spironolactone, and Statins   Social History   Socioeconomic History   Marital status: Widowed    Spouse name: Not on file   Number of children: Not on file   Years of education: Not on file   Highest education level: Not on  file  Occupational History   Not on file  Tobacco Use   Smoking status: Never   Smokeless tobacco: Never  Vaping Use   Vaping Use: Never used  Substance and Sexual Activity   Alcohol use: No   Drug use: No   Sexual activity: Not Currently    Birth control/protection: Post-menopausal  Other Topics Concern   Not on file  Social History Narrative   Lives at home with her dog    Right handed   Caffeine: maybe 1 cup/day   Social Determinants of Health   Financial Resource Strain: Low Risk  (12/18/2017)   Overall Financial Resource Strain (CARDIA)    Difficulty of Paying Living Expenses: Not very hard  Food Insecurity: Unknown (12/18/2017)   Hunger Vital Sign    Worried About Running Out of Food in the Last Year: Patient refused    [place] in the Last Year: Patient refused  Transportation Needs: Unknown (12/18/2017)   PRAPARE - Transportation    Lack of Transportation (Medical): Patient refused    Lack of Transportation (Non-Medical): Patient refused  Physical Activity: Unknown (12/18/2017)   Exercise  Vital Sign    Days of Exercise per Week: Patient refused    Minutes of Exercise per Session: Patient refused  Stress: No Stress Concern Present (12/18/2017)   [place]    Feeling of Stress : Not at all  Social Connections: Unknown (12/18/2017)   Social Connection and Isolation Panel [NHANES]    Frequency of Communication with Friends and Family: Patient refused    Frequency of Social Gatherings with Friends and Family: Patient refused    Attends Religious Services: Patient refused    Marine scientist or Organizations: Patient refused    Attends Music therapist: Patient refused    Marital Status: Patient refused     Family History: The patient's family history includes Diabetes in her mother; Heart attack in her father; Heart disease in her brother, father, and mother; Hyperlipidemia in  her mother; Hypertension in her brother, father, and mother; Stroke in her mother. There is no history of Migraines. ROS:   Please see the history of present illness.    All other systems reviewed and are negative.  EKGs/Labs/Other Studies Reviewed:    The following studies were reviewed today:  She relates recent labs in the last 2 weeks with her PCP unavailable in [place] or epic results requested  Recent Labs: 04/09/2021: Hemoglobin 14.9; Platelets 258 05/14/2021: BUN 17; Creatinine, Ser 0.68; Potassium 3.4; Sodium 139  Recent Lipid Panel    Component Value Date/Time   CHOL 205 (H) 12/18/2017 0353   TRIG 124 12/18/2017 0353   HDL 41 12/18/2017 0353   CHOLHDL 5.0 12/18/2017 0353   VLDL 25 12/18/2017 0353   LDLCALC 139 (H) 12/18/2017 0353    Physical Exam:    VS:  BP (!) 176/70 (BP Location: Right Arm, Patient Position: Sitting)   Pulse 60   Ht 5\' 1"  (1.549 m)   Wt 104 lb (47.2 kg)   SpO2 98%   BMI 19.65 kg/m     Wt Readings from Last 3 Encounters:  11/30/21 104 lb (47.2 kg)  09/17/21 105 lb (47.6 kg)  06/02/21 109 lb 6.4 oz (49.6 kg)     GEN:  Well nourished, well developed in no acute distress HEENT: Normal NECK: No JVD; No carotid bruits LYMPHATICS: No lymphadenopathy CARDIAC: RRR, no murmurs, rubs, gallops RESPIRATORY:  Clear to auscultation without rales, wheezing or rhonchi  ABDOMEN: Soft, non-tender, non-distended MUSCULOSKELETAL:  No edema; No deformity  SKIN: Warm and dry NEUROLOGIC:  Alert and oriented x 3 PSYCHIATRIC:  Normal affect    Signed, Shirlee More, MD  11/30/2021 10:43 AM    [place]

## 2021-11-30 ENCOUNTER — Ambulatory Visit: Payer: Medicare Other | Attending: Cardiology | Admitting: Cardiology

## 2021-11-30 ENCOUNTER — Encounter: Payer: Self-pay | Admitting: Cardiology

## 2021-11-30 VITALS — BP 176/70 | HR 60 | Ht 61.0 in | Wt 104.0 lb

## 2021-11-30 DIAGNOSIS — I701 Atherosclerosis of renal artery: Secondary | ICD-10-CM | POA: Diagnosis present

## 2021-11-30 DIAGNOSIS — I10 Essential (primary) hypertension: Secondary | ICD-10-CM

## 2021-11-30 DIAGNOSIS — E785 Hyperlipidemia, unspecified: Secondary | ICD-10-CM | POA: Diagnosis present

## 2021-11-30 NOTE — Patient Instructions (Addendum)
[facility]  Tips to measure your blood pressure correctly  To determine whether you have hypertension, a medical professional will take a blood pressure reading. How you prepare for the test, the position of your arm, and other factors can change a blood pressure reading by 10% or more. That could be enough to hide high blood pressure, start you on a drug you don't really need, or lead your doctor to incorrectly adjust your medications. National and international guidelines offer specific instructions for measuring blood pressure. If a doctor, nurse, or medical assistant isn't doing it right, don't hesitate to ask him or her to get with the guidelines. Here's what you can do to ensure a correct reading:  Don't drink a caffeinated beverage or smoke during the 30 minutes before the test.  Sit quietly for five minutes before the test begins.  During the measurement, sit in a chair with your feet on the floor and your arm supported so your elbow is at about heart level.  The inflatable part of the cuff should completely cover at least 80% of your upper arm, and the cuff should be placed on bare skin, not over a shirt.  Don't talk during the measurement.  Have your blood pressure measured twice, with a brief break in between. If the readings are different by 5 points or more, have it done a third time. There are times to break these rules. If you sometimes feel lightheaded when getting out of bed in the morning or when you stand after sitting, you should have your blood pressure checked while seated and then while standing to see if it falls from one position to the next. Because blood pressure varies throughout the day, your doctor will rarely diagnose hypertension on the basis of a single reading. Instead, he or she will want to confirm the measurements on at least two occasions, usually within a few weeks of one another. The exception to this rule is if you have a blood pressure reading of 180/110 mm Hg  or higher. A result this high usually calls for prompt treatment. It's also a good idea to have your blood pressure measured in both arms at least once, since the reading in one arm (usually the right) may be higher than that in the left. A 2014 study in The American Journal of Medicine of nearly 3,400 people found average arm- to-arm differences in systolic blood pressure of about 5 points. The higher number should be used to make treatment decisions. In 2017, new guidelines from the [place], the [employer] of Cardiology, and nine other health organizations lowered the diagnosis of high blood pressure to 130/80 mm Hg or higher for all adults. The guidelines also redefined the various blood pressure categories to now include normal, elevated, Stage 1 hypertension, Stage 2 hypertension, and hypertensive crisis (see "Blood pressure categories"). Blood pressure categories  Blood pressure category SYSTOLIC (upper number)  DIASTOLIC (lower number)  Normal Less than 120 mm Hg and Less than 80 mm Hg  Elevated 120-129 mm Hg and Less than 80 mm Hg  High blood pressure: Stage 1 hypertension 130-139 mm Hg or 80-89 mm Hg  High blood pressure: Stage 2 hypertension 140 mm Hg or higher or 90 mm Hg or higher  Hypertensive crisis (consult your doctor immediately) Higher than 180 mm Hg and/or Higher than 120 mm Hg  Source: American Heart Association and American Stroke Association. For more on getting your blood pressure under control, buy Controlling Your Blood  Pressure, a Special Health Report from [facility].         Medication Instructions:  Your physician recommends that you continue on your current medications as directed. Please refer to the Current Medication list given to you today.  *If you need a refill on your cardiac medications before your next appointment, please call your pharmacy*   Lab Work: NONE, will send request to PCP for results If you have labs  (blood work) drawn today and your tests are completely normal, you will receive your results only by: [place] (if you have MyChart) OR A paper copy in the mail If you have any lab test that is abnormal or we need to change your treatment, we will call you to review the results.   Testing/Procedures: NONE   Follow-Up: At [facility], you and your health needs are our priority.  As part of our continuing mission to provide you with exceptional heart care, we have created designated Provider Care Teams.  These Care Teams include your primary Cardiologist (physician) and Advanced Practice Providers (APPs -  Physician Assistants and Nurse Practitioners) who all work together to provide you with the care you need, when you need it.  We recommend signing up for the patient portal called "MyChart".  Sign up information is provided on this After Visit Summary.  MyChart is used to connect with patients for Virtual Visits (Telemedicine).  Patients are able to view lab/test results, encounter notes, upcoming appointments, etc.  Non-urgent messages can be sent to your provider as well.   To learn more about what you can do with MyChart, go to [email or web address].    Your next appointment:   9 month(s)  The format for your next appointment:   In Person  Provider:   Shirlee More, MD    Other Instructions   Important Information About Sugar

## 2021-12-01 ENCOUNTER — Encounter: Payer: Self-pay | Admitting: Cardiology

## 2021-12-02 ENCOUNTER — Encounter: Payer: Self-pay | Admitting: Cardiology

## 2021-12-07 ENCOUNTER — Telehealth: Payer: Self-pay | Admitting: Behavioral Health

## 2021-12-07 NOTE — Telephone Encounter (Signed)
Patient lvm stating she is taking an extra Ativan a total of 3. Her PCP advised that she notify Aaron Edelman to adjust her Rx . Patient was last seen on 11/19/21 with a follow up scheduled for 05/21/22.  Contact information [phone]

## 2021-12-07 NOTE — Telephone Encounter (Signed)
Pt has been taking extra ativan.Is it ok for her to increase to 3?It was decreased at the last visit

## 2021-12-08 ENCOUNTER — Other Ambulatory Visit: Payer: Self-pay

## 2021-12-08 DIAGNOSIS — F411 Generalized anxiety disorder: Secondary | ICD-10-CM

## 2021-12-08 MED ORDER — LORAZEPAM 0.5 MG PO TABS: 0.5000 mg | ORAL_TABLET | Freq: Three times a day (TID) | ORAL | 0 refills | Status: DC

## 2021-12-08 NOTE — Telephone Encounter (Signed)
Ok, please change the RX and I will approve, she understands the risk.

## 2021-12-08 NOTE — Telephone Encounter (Signed)
Pt said she is afraid that she will have more of a fall risk due to her having tremors when she does not have med.I explained to her why you did not want her on 3 daily,but she kept saying that it's only 0.5 mg and not enough and that she had been taking that dose for 14 years.I told her I will let her know if you reconsider but she has been taking 3 daily for a week.

## 2021-12-08 NOTE — Telephone Encounter (Signed)
Pended.

## 2021-12-08 NOTE — Telephone Encounter (Signed)
I am not comfortable with that due to fall risk, and she is already foggy at times.  I discussed that with her last visit.

## 2021-12-09 ENCOUNTER — Encounter: Payer: Self-pay | Admitting: Cardiology

## 2021-12-13 ENCOUNTER — Encounter: Payer: Self-pay | Admitting: Neurology

## 2021-12-26 ENCOUNTER — Telehealth: Payer: Self-pay

## 2021-12-26 NOTE — Telephone Encounter (Signed)
During conversation patient mentioned that they were having their U/S covered by cardiologist, Dr.Munley. and that if Dr. Lenell Antu wanted to see her about the results she would be happy to come in.   I informed the patient that I would fwd the question to Dr.Hawken.  Renal Artery [place] comp 06/02/21

## 2022-01-26 ENCOUNTER — Ambulatory Visit: Payer: Medicare Other | Admitting: Family Medicine

## 2022-01-27 ENCOUNTER — Telehealth: Payer: Self-pay | Admitting: Behavioral Health

## 2022-01-27 NOTE — Telephone Encounter (Signed)
Error

## 2022-03-20 ENCOUNTER — Encounter: Payer: Self-pay | Admitting: Neurology

## 2022-03-21 ENCOUNTER — Encounter: Payer: Self-pay | Admitting: Neurology

## 2022-03-22 ENCOUNTER — Encounter: Payer: Self-pay | Admitting: Neurology

## 2022-03-22 NOTE — Telephone Encounter (Signed)
Duplicate messages

## 2022-03-29 ENCOUNTER — Telehealth: Payer: Self-pay | Admitting: Student

## 2022-03-29 ENCOUNTER — Encounter: Payer: Self-pay | Admitting: Neurology

## 2022-03-29 NOTE — Telephone Encounter (Signed)
Pt asked that it be noted that [name], Nphas the name of the medication the Dr Laqueta Due prescribed.  Pt states that [name], NP took her off of it because it caused rebound headaches.  Pt is asking that it be called in for her again so she can get some relief.  Pt asking it be called into [facility] in [place]

## 2022-03-29 NOTE — Telephone Encounter (Signed)
Sent message in [place] to pt.

## 2022-03-29 NOTE — Telephone Encounter (Signed)
I am boked out many months have her see a PA please I will make sure I discuss her case with PA before she gets hee and she can come back to me next time

## 2022-03-29 NOTE — Telephone Encounter (Signed)
Pt is calling crying. Stated she is having very bad margarines. Pt is requesting a call back from nurse.

## 2022-03-29 NOTE — Telephone Encounter (Signed)
Spoke to patient gave her the name of medication Butalbital was the medication she was referring to . Informed patient [name],NP didn't prescribe and she would have to call the prescriber of the medication . Offered patient sooner appointment with [name],NP Pt refused appointment  persistent to see Dr Jaynee Eagles. Pt has appointment with Dr Jaynee Eagles  04/2022 .  Informed patient if Migraine/headaches continue to go to ED or urgent care to be evaluated pt expressed understanding and thanked me for calling

## 2022-03-29 NOTE — Telephone Encounter (Signed)
I called pt and she was having migraines, emgality not working as well.  She is not able to take Nsaids. She has tried excedrin migriane helps some.  Taking 342m po daily aspirin for her stroke.  She mentioned having a fall.  Broke her collar bone. (Wears a sling).  She has tried ubrelvy/nurtec samples and cost is factor.  She was asking about botox.  I told her that I could offer a sooner appt with AL/NP tomorrow at 1130 to evaluate.  She did not want to come then, wanted to see Dr. AJaynee Eagles  I mentioned that if things got worse she could got to urgent care. She said codeine did not help.  [facility]

## 2022-03-30 ENCOUNTER — Other Ambulatory Visit: Payer: Self-pay | Admitting: Behavioral Health

## 2022-03-30 ENCOUNTER — Telehealth: Payer: Self-pay | Admitting: Behavioral Health

## 2022-03-30 DIAGNOSIS — F411 Generalized anxiety disorder: Secondary | ICD-10-CM

## 2022-03-30 NOTE — Telephone Encounter (Signed)
Patient lvm at 9:53 stating that she spoke with pharmacy and they she should have a one presciption for Ativan for 60 tablets. She has some concerns because she has been taking the pills 3x a day like she is supposed and only has 60 tablets and is scared she may not have enough please rtc [phone] appt 4/5

## 2022-03-30 NOTE — Telephone Encounter (Signed)
This has been discussed with the patient in detail today. I will have to check with the pharmacy to see if they gave her multiple bottles and she possibly misplaced one. Patient still has 20 days of medication left. She said she is just stressed, that is how she is. I will address this again with the pharmacy, but it won't be today.

## 2022-03-30 NOTE — Telephone Encounter (Signed)
Last filled 12/13 #270 tablets for 90-day supply. Patient said she only has 60 tablets left and that isn't enough until her next RF. I asked if she was given more than one bottle and she said no.  The pharmacy has already said they filled 270 tablets, but will check and see if they gave multiple bottles.

## 2022-03-30 NOTE — Telephone Encounter (Signed)
Pt called back 10:35am - She feels she is 30 pills short on her Ativan RX. I called pharmacy and they confirmed that she picked up her #270 pills from RX 10/24. Pt states she is only taking it three times per day. Please call pt.

## 2022-03-30 NOTE — Telephone Encounter (Signed)
noted 

## 2022-03-30 NOTE — Telephone Encounter (Signed)
Pt LVM @ 3:22p for the same issue as earlier today.

## 2022-03-30 NOTE — Telephone Encounter (Signed)
Tried calling pharmacy to see if she received multiple bottles and was not able to get thru to a live person. Will try again in the morning.

## 2022-03-31 NOTE — Telephone Encounter (Signed)
Called pharmacy and they dispensed 270 tablets in one bottle.  Pharmacist said they take pictures of all controlled medication and printed a picture for patient of the filled bottle. Will let patient know.

## 2022-03-31 NOTE — Telephone Encounter (Signed)
Called patient back to let her know what the pharmacist told me. She had called the pharmacist again today and she knows that the count was correct.  Patient said she knows that she lost or someone stole 30 tablets. She said she is on another medication for headache and they makes her sleepy.  The pharmacist made suggestions of timing of medication to try to help her stretch what she has left.  She said she will call if she is not able to stretch to see if we will send a few more tablets in.

## 2022-04-01 NOTE — Telephone Encounter (Signed)
Patient has called our office multiple times a day for several days. She has also called the pharmacy several times. She filled her lorazepam on 12/13, #270 tablets. She said she is missing 30 tablets and will run out before she is due for a RF, but she isn't out yet.  Her total supply came in one bottle. The pharmacy said they take pictures of all the controlled fills and this was provided to the patient by the pharmacy. She said someone stole or she lost 30 pills. She is already asking if we can give her a few to carry her over, when she isn't out yet. She is on another medication for headache and the pharmacist gave her suggestions to try to take 2 lorazepam and the other medication to stretch out what she has left. Yesterday she was agreeable to try this, but today she called saying she can't do it, she needs TID.  At this point this is just an [place].

## 2022-04-01 NOTE — Telephone Encounter (Signed)
Told patient that Brandy Flowers will not refill early and that he suggests following what the pharmacist recommended. I told her she could make an appt to come in earlier to see if he could prescribe another medication for anxiety.

## 2022-04-01 NOTE — Telephone Encounter (Signed)
Brandy Flowers has called a few times this am and left messages. She states that when she awoke she had a really bad headache and took her Ativan and the headache subsided. She states that she does need the Ativan regularly or else she will get a headache.

## 2022-04-22 ENCOUNTER — Other Ambulatory Visit: Payer: Self-pay | Admitting: Neurology

## 2022-04-22 ENCOUNTER — Other Ambulatory Visit: Payer: Self-pay | Admitting: Behavioral Health

## 2022-04-22 DIAGNOSIS — F411 Generalized anxiety disorder: Secondary | ICD-10-CM

## 2022-04-22 NOTE — Telephone Encounter (Signed)
Has not had another fill since 04-2021. Has appt coming in 04/2022

## 2022-04-25 ENCOUNTER — Encounter: Payer: Self-pay | Admitting: Neurology

## 2022-04-26 ENCOUNTER — Telehealth: Payer: Self-pay | Admitting: Neurology

## 2022-04-26 NOTE — Telephone Encounter (Signed)
Patient called with headaches. We haven;t seen ehr in over 6 months can you call and get her shceduled? She saw amy, does anything have anything available??

## 2022-04-27 NOTE — Telephone Encounter (Signed)
Thank you :)

## 2022-04-27 NOTE — Telephone Encounter (Signed)
Pt is already on the schedule to see you for 05/10/22 and I checked [name]'s schedule and she didn't have any sooner openings so I went ahead and added her to the wait list

## 2022-05-10 ENCOUNTER — Telehealth: Payer: Medicare Other | Admitting: Neurology

## 2022-05-13 ENCOUNTER — Encounter: Payer: Self-pay | Admitting: Neurology

## 2022-05-13 ENCOUNTER — Telehealth (INDEPENDENT_AMBULATORY_CARE_PROVIDER_SITE_OTHER): Payer: Self-pay | Admitting: Neurology

## 2022-05-13 DIAGNOSIS — G8929 Other chronic pain: Secondary | ICD-10-CM

## 2022-05-13 DIAGNOSIS — R519 Headache, unspecified: Secondary | ICD-10-CM

## 2022-05-13 NOTE — Progress Notes (Signed)
She Could not get her mychart working will reschedule in the office next week

## 2022-05-15 ENCOUNTER — Encounter: Payer: Self-pay | Admitting: Neurology

## 2022-05-17 ENCOUNTER — Encounter: Payer: Self-pay | Admitting: Neurology

## 2022-05-19 ENCOUNTER — Encounter: Payer: Self-pay | Admitting: Neurology

## 2022-05-21 ENCOUNTER — Encounter: Payer: Self-pay | Admitting: Behavioral Health

## 2022-05-21 ENCOUNTER — Ambulatory Visit (INDEPENDENT_AMBULATORY_CARE_PROVIDER_SITE_OTHER): Payer: Medicare Other | Admitting: Behavioral Health

## 2022-05-21 DIAGNOSIS — F411 Generalized anxiety disorder: Secondary | ICD-10-CM | POA: Diagnosis not present

## 2022-05-21 NOTE — Progress Notes (Signed)
[facility]  Patient ID: Eugene GarnetBarbara Yanko,  MRN: [id number]  PCP: Lars Mageetter, Devin B, NP  Date of Evaluation: 05/21/2022 Time spent:30 minutes  Chief Complaint:  Chief Complaint   Anxiety; Follow-up; Patient Education; Medication Refill     HISTORY/CURRENT STATUS: HPI  "Britta MccreedyBarbara", 82 year old female presents to this office for initial visit and to establish care.  No changes this visit. No complications from reducing benzodiazapine use. A friend of the family is present with her consents. Friend provides transportation and assist with medical appointments.  Says she is doing well but get very discouraged about politics recently. Following up soon with PCP for migraines and pain behind the ear. Negative PHQ-2. Says her depression is 0/10 but anxiety is 3/10. Says she does sleep 7 hours per night. She denies mania or psychosis. No delirium, no auditory or visual hallucinations reported. No SI/HI.    Prior psychiatric medications: Klonopin Ativan Clonidine-Headache, flu like symptoms Lexapro-brain fog  Individual Medical History/ Review of Systems: Changes? :No   Allergies: Amiloride, Blue dyes (parenteral), Inderal la [propranolol hcl], Other, Sulfa antibiotics, Ace inhibitors, Norvasc [amlodipine], Calcium channel blockers, Catapres [clonidine hcl], Clonidine derivatives, Diovan [valsartan], Hydralazine, Metformin and related, Micardis [telmisartan], Minoxidil, Olmesartan, Plavix [clopidogrel], Serotonin reuptake inhibitors (ssris), Sulfur, Topamax [topiramate], Tramadol, Tylenol with codeine #3 [acetaminophen-codeine], Zetia [ezetimibe], Amoxicillin, Chlorthalidone, Doxycycline, Fosamax [alendronate sodium], Lexapro [escitalopram], Spironolactone, and Statins  Current Medications:  Current Outpatient Medications:    aspirin 325 MG tablet, Take 650 mg by mouth daily., Disp: , Rfl:    atenolol (TENORMIN) 50 MG tablet, Take 50 mg by mouth in the morning and at bedtime., Disp: ,  Rfl:    Continuous Blood Gluc Receiver (FREESTYLE LIBRE 2 READER) DEVI, , Disp: , Rfl:    Continuous Blood Gluc Sensor (FREESTYLE LIBRE 2 SENSOR) MISC, , Disp: , Rfl:    diclofenac (FLECTOR) 1.3 % PTCH, Place 1 patch onto the skin 2 (two) times daily as needed (pain)., Disp: , Rfl:    Evolocumab (REPATHA SURECLICK) 140 MG/ML SOAJ, Inject 140 mg into the skin every 14 (fourteen) days., Disp: 2 mL, Rfl: 11   Galcanezumab-gnlm (EMGALITY) 120 MG/ML SOAJ, Inject 120 mg into the skin every 30 (thirty) days., Disp: , Rfl:    lidocaine (LIDODERM) 5 %, 1 patch daily as needed (pain)., Disp: , Rfl:    LORazepam (ATIVAN) 0.5 MG tablet, TAKE 1 TABLET BY MOUTH 3 TIMES DAILY., Disp: 270 tablet, Rfl: 0   losartan (COZAAR) 50 MG tablet, Take 50 mg by mouth 2 (two) times daily., Disp: , Rfl:    SYNTHROID 50 MCG tablet, Take 100 mcg by mouth daily before breakfast. Must take (2) 50 mg can not take blue pills, Disp: , Rfl:  Medication Side Effects: none  Family Medical/ Social History: Changes? No  MENTAL HEALTH EXAM:  There were no vitals taken for this visit.There is no height or weight on file to calculate BMI.  General Appearance: Casual, Neat, and Well Groomed  Eye Contact:  Good  Speech:  Clear and Coherent  Volume:  Normal  Mood:  NA  Affect:  Appropriate  Thought Process:  Coherent  Orientation:  Full (Time, Place, and Person)  Thought Content: Logical   Suicidal Thoughts:  No  Homicidal Thoughts:  No  Memory:  WNL  Judgement:  Good  Insight:  Good  Psychomotor Activity:  Normal  Concentration:  Concentration: Good  Recall:  Good  Fund of Knowledge: Good  Language: Good  Assets:  Desire  for Improvement  ADL's:  Intact  Cognition: WNL  Prognosis:  Good    DIAGNOSES: No diagnosis found.  Receiving Psychotherapy: No    RECOMMENDATIONS:   Greater than 50% of 30 min face to face time with patient was spent on counseling and coordination of care. We discuss her continue concerns over  her medical health issues. She continues to follow up with PCP regularly   She denies all depression but it is apparent that she meets the criteria.  I counseled her again today about being very careful when using benzo and not taking same time with pain medication. We agreed today: Will reduce Ativan to 0.5 mg thee times daily as needed. Will try to reduce to 0.25 three times daily if possible Discussed potential benefits, risks, and side effects of  To follow up in 6 months to reassess. Provided emergency contact information Reviewed PDMP       Joan Flores, NP

## 2022-05-24 ENCOUNTER — Telehealth: Payer: Self-pay | Admitting: Behavioral Health

## 2022-05-24 NOTE — Telephone Encounter (Signed)
She remembered Dr. Janett Labella new program and wanted to let Arlys John know. He was notified.

## 2022-05-28 LAB — LAB REPORT - SCANNED
A1c: 6.4
EGFR: 90.4

## 2022-06-02 ENCOUNTER — Telehealth: Payer: Self-pay

## 2022-06-02 NOTE — Telephone Encounter (Signed)
05/27/2022 labs received from patients pcp forward to Dr Dulce Sellar for review

## 2022-06-08 ENCOUNTER — Ambulatory Visit (INDEPENDENT_AMBULATORY_CARE_PROVIDER_SITE_OTHER): Payer: Medicare Other | Admitting: Adult Health

## 2022-06-08 ENCOUNTER — Encounter: Payer: Self-pay | Admitting: Adult Health

## 2022-06-08 VITALS — BP 140/70 | HR 68 | Wt 104.0 lb

## 2022-06-08 DIAGNOSIS — R519 Headache, unspecified: Secondary | ICD-10-CM | POA: Diagnosis not present

## 2022-06-08 DIAGNOSIS — G8929 Other chronic pain: Secondary | ICD-10-CM

## 2022-06-08 NOTE — Progress Notes (Signed)
PATIENT: Brandy Flowers DOB: 13-Jun-1940  REASON FOR VISIT: follow up HISTORY FROM: patient PRIMARY NEUROLOGIST: Dr. Lucia Gaskins  HISTORY OF PRESENT ILLNESS: Today 06/08/22  Brandy Flowers is a 82 y.o. female who has been followed in this office for migraine headaches. Returns today for follow-up.  She reports that she has 20-25 headache days a month takes excedrin for migraine but not daily.  She does have Fioricet but only uses it on the rare occasion.  She states in the past she was given a sample of Botox and Emgality at the same time.  She states that she went 3 months with no migraines.  She thought it was the [facility] but headaches returned after 12 weeks despite being on Emgality.  Patient would like to be on Botox therapy for migraines  HISTORY 09/17/21 ALL:  Brandy Flowers is a 82 y.o. female here today for follow up for migraines. 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 She had samples of Ubrelvy for abortive therapy but did not pick up her rx. Per Epic, it was approved. She reports daily headaches. Most all are migrainous. She has been taking butalbital 2-3 times a week that gives her some relief. She admits that BP is usually around 170/80 at home. She has been working with her psychiatrist and reports he has asked her to wean Ativan from TID to BID. She feels this dose change has contributed to a worsening headache over the past few days. She reports seeing PCP last week. It is unclear on what advise she was given at that time regarding BP. She is requesting that I give her hydralazine used during her MRI in 04/2021.  REVIEW OF SYSTEMS: Out of a complete 14 system review of symptoms, the patient complains only of the following symptoms, and all other reviewed systems are negative.  ALLERGIES: Allergies  Allergen Reactions   Amiloride Shortness Of Breath   Blue Dyes (Parenteral) Shortness Of Breath, Rash and Other (See Comments)    Joint pain   Inderal La [Propranolol Hcl] Shortness Of Breath    Other Other (See Comments), Rash and Shortness Of Breath    Joint pain   Sulfa Antibiotics Anaphylaxis   Ace Inhibitors Nausea And Vomiting   Norvasc [Amlodipine] Nausea And Vomiting    Taking at this time. No problems   Calcium Channel Blockers Diarrhea   Catapres [Clonidine Hcl]     Decreased mental clarity    Clonidine Derivatives Other (See Comments)    Headache, flu like symptoms   Diovan [Valsartan] Cough   Hydralazine Other (See Comments)    Headache   Metformin And Related Diarrhea   Micardis [Telmisartan] Other (See Comments)    Hallucinations, increase in BP   Minoxidil Other (See Comments)    Fast heart rate, chest pain   Olmesartan Swelling    "Made me very ill to include lip swelling"    Plavix [Clopidogrel]     Increased LFT's    Serotonin Reuptake Inhibitors (Ssris) Other (See Comments)    Headache   Sulfur    Topamax [Topiramate] Other (See Comments)    Confusion   Tramadol     Nausea as a dog per pt   Tylenol With Codeine #3 [Acetaminophen-Codeine] Other (See Comments)    Lightheaded   Zetia [Ezetimibe]     Liver  Function increase labs   Amoxicillin Diarrhea   Chlorthalidone     Dizziness.    Doxycycline Diarrhea   Fosamax [Alendronate Sodium]     Pt's teeth fell  out while taking.    Lexapro [Escitalopram]     Brain Fog    Spironolactone Palpitations   Statins     Upset stomach     HOME MEDICATIONS: Outpatient Medications Prior to Visit  Medication Sig Dispense Refill   aspirin 325 MG tablet Take 650 mg by mouth daily.     atenolol (TENORMIN) 50 MG tablet Take 50 mg by mouth in the morning and at bedtime.     cefdinir (OMNICEF) 300 MG capsule SMARTSIG:1 Tablet(s) By Mouth Every 12 Hours     Continuous Blood Gluc Receiver (FREESTYLE LIBRE 2 READER) DEVI      Continuous Blood Gluc Sensor (FREESTYLE LIBRE 2 SENSOR) MISC      diclofenac (FLECTOR) 1.3 % PTCH Place 1 patch onto the skin 2 (two) times daily as needed (pain).     Evolocumab (REPATHA  SURECLICK) 140 MG/ML SOAJ Inject 140 mg into the skin every 14 (fourteen) days. 2 mL 11   lidocaine (LIDODERM) 5 % 1 patch daily as needed (pain).     LORazepam (ATIVAN) 0.5 MG tablet TAKE 1 TABLET BY MOUTH 3 TIMES DAILY. 270 tablet 0   losartan (COZAAR) 50 MG tablet Take 50 mg by mouth 2 (two) times daily.     SYNTHROID 50 MCG tablet Take 100 mcg by mouth daily before breakfast. Must take (2) 50 mg can not take blue pills     No facility-administered medications prior to visit.    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%   Sigmoid diverticulosis    Sinus problem    Splenic artery aneurysm    7mm   Stroke 12/16/2017   Thyroid disease     PAST SURGICAL HISTORY: Past Surgical History:  Procedure Laterality Date   BREAST REDUCTION SURGERY  2007   CATARACT EXTRACTION Bilateral    CHOLECYSTECTOMY     COLONOSCOPY     RADIOLOGY WITH ANESTHESIA N/A 05/14/2021   Procedure: MRI BRAIN WITH AND WITHOUT CONTRAST WITH ANESTHESIA;  Surgeon: Radiologist, Medication, MD;  Location: [facility] OR;  Service: Radiology;  Laterality: N/A;   RADIOLOGY WITH ANESTHESIA N/A 05/14/2021   Procedure: MRV WITHOUT CONTRAST WITH ANESTHESIA;  Surgeon: Radiologist, Medication, MD;  Location: [facility] OR;  Service: Radiology;  Laterality: N/A;   TONSILLECTOMY AND ADENOIDECTOMY     TOOTH EXTRACTION  2019   with anesthesia   WISDOM TOOTH EXTRACTION      FAMILY HISTORY: Family History  Problem Relation Age of Onset   Stroke Mother    Diabetes Mother    Heart disease Mother        Before age 33   Hypertension Mother    Hyperlipidemia Mother    Heart disease Father    Heart attack Father    Hypertension Father    Heart disease  Brother        After age 17- CABG   Hypertension Brother    Migraines Neg Hx     SOCIAL HISTORY: Social History   Socioeconomic History   Marital status: Widowed    Spouse name: Not on file   Number of children:  Not on file   Years of education: Not on file   Highest education level: Not on file  Occupational History   Not on file  Tobacco Use   Smoking status: Never   Smokeless tobacco: Never  Vaping Use   Vaping Use: Never used  Substance and Sexual Activity   Alcohol use: No   Drug use: No   Sexual activity: Not Currently    Birth control/protection: Post-menopausal  Other Topics Concern   Not on file  Social History Narrative   Lives at home with her dog    Right handed   Caffeine: maybe 1 cup/day   Social Determinants of Health   Financial Resource Strain: Low Risk  (12/18/2017)   Overall Financial Resource Strain (CARDIA)    Difficulty of Paying Living Expenses: Not very hard  Food Insecurity: Unknown (12/18/2017)   Hunger Vital Sign    Worried About Running Out of Food in the Last Year: Patient declined    Ran Out of Food in the Last Year: Patient declined  Transportation Needs: Unknown (12/18/2017)   PRAPARE - Administrator, Civil Service (Medical): Patient declined    Lack of Transportation (Non-Medical): Patient declined  Physical Activity: Unknown (12/18/2017)   Exercise Vital Sign    Days of Exercise per Week: Patient declined    Minutes of Exercise per Session: Patient declined  Stress: No Stress Concern Present (12/18/2017)   [employer] of Occupational Health - Occupational Stress Questionnaire    Feeling of Stress : Not at all  Social Connections: Unknown (12/18/2017)   Social Connection and Isolation Panel [NHANES]    Frequency of Communication with Friends and Family: Patient declined    Frequency of Social Gatherings with Friends and Family: Patient declined    Attends Religious Services: Patient declined    Doctor, general practice or Organizations: Patient declined    Attends Banker Meetings: Patient declined    Marital Status: Patient declined  Intimate Partner Violence: Unknown (12/18/2017)   Humiliation, Afraid, Rape, and Kick questionnaire    Fear of Current or Ex-Partner: Patient declined    Emotionally Abused: Patient declined    Physically Abused: Patient declined    Sexually Abused: Patient declined      PHYSICAL EXAM  Vitals:   06/08/22 1108  BP: (!) 140/70  Pulse: 68  Weight: 104 lb (47.2 kg)   Body mass index is 19.65 kg/m.  Generalized: Well developed, in no acute distress   Neurological examination  Mentation: Alert oriented to time, place, history taking. Follows all commands speech and language fluent Cranial nerve II-XII: Pupils were equal round reactive to light. Extraocular movements were full, visual field were full on confrontational test. Facial sensation and strength were normal.  Head turning and shoulder shrug  were normal and symmetric. Motor: The motor testing reveals 5 over 5 strength of all 4 extremities. Good symmetric motor tone is noted throughout.  Sensory: Sensory testing is intact to soft touch on all 4 extremities. No evidence of extinction is noted.  Coordination: Cerebellar testing reveals good finger-nose-finger and heel-to-shin bilaterally.  Gait and station: Gait is normal. Reflexes: Deep tendon reflexes are symmetric and normal bilaterally.   DIAGNOSTIC DATA (LABS, IMAGING, TESTING) - I reviewed patient records, labs, notes, testing and imaging myself where available.  Lab Results  Component Value Date   WBC 8.4 04/09/2021   HGB 14.9 04/09/2021   HCT 44.7 04/09/2021   MCV 88 04/09/2021   PLT  258 04/09/2021      Component Value Date/Time   NA 139 05/14/2021 0855   NA 139 04/09/2021 1156   K 3.4 (L) 05/14/2021 0855   CL 103 05/14/2021 0855   CO2 26 05/14/2021 0855   GLUCOSE 204 (H) 05/14/2021 0855   BUN 17 05/14/2021 0855   BUN 21  04/09/2021 1156   CREATININE 0.68 05/14/2021 0855   CALCIUM 9.6 05/14/2021 0855   PROT 6.9 12/26/2018 1552   ALBUMIN 3.8 12/26/2018 1552   AST 24 12/26/2018 1552   ALT 22 12/26/2018 1552   ALKPHOS 60 12/26/2018 1552   BILITOT 1.0 12/26/2018 1552   GFRNONAA >60 05/14/2021 0855   GFRAA >60 12/26/2018 1420   Lab Results  Component Value Date   CHOL 205 (H) 12/18/2017   HDL 41 12/18/2017   LDLCALC 139 (H) 12/18/2017   TRIG 124 12/18/2017   CHOLHDL 5.0 12/18/2017   Lab Results  Component Value Date   HGBA1C 6.5 (H) 12/18/2017      ASSESSMENT AND PLAN 82 y.o. year old female  has a past medical history of Anxiety, Arthritis, COPD with exacerbation, Diabetes mellitus without complication, Diastolic dysfunction, Hyperlipidemia, Hypertension, Hypothyroidism, Migraines, Renal artery stenosis, Sigmoid diverticulosis, Sinus problem, Splenic artery aneurysm, Stroke (12/16/2017), and Thyroid disease. here with:  1.  Migraine headaches  Will consider starting Botox therapy-will discuss with Dr. Lovena Le as this is my first time seeing the patient she has previously seen Dr. Lucia Gaskins and Shawnie Dapper NP. Could retry Nurtec or Ubrelvy for abortive therapy in the future Follow-up pending Botox authorization     Butch Penny, MSN, NP-C 06/08/2022, 11:07 AM [facility] [address] [place], [place] [address] [phone]

## 2022-06-10 ENCOUNTER — Telehealth: Payer: Self-pay | Admitting: *Deleted

## 2022-06-10 NOTE — Telephone Encounter (Addendum)
Chronic Migraine CPT 64615  Botox J0585 Units:200  G43.709 Chronic Migraine without aura, not intractable, without status migrainous  Ordered by M. Ethelene Browns NP  --------------------  Per Aundra Millet NP, use Dx code [phone]   ----- Message ----- From: Butch Penny, NP Sent: 06/09/2022   8:21 AM EDT To: [facility] Call[facility]  Lets start botox

## 2022-06-13 ENCOUNTER — Encounter: Payer: Self-pay | Admitting: Adult Health

## 2022-06-15 ENCOUNTER — Telehealth: Payer: Self-pay

## 2022-06-15 ENCOUNTER — Other Ambulatory Visit (HOSPITAL_COMMUNITY): Payer: Self-pay

## 2022-06-15 ENCOUNTER — Ambulatory Visit: Payer: Medicare Other | Admitting: Adult Health

## 2022-06-15 NOTE — Telephone Encounter (Signed)
Benefit Verification [id number] Submitted!  Botox One.

## 2022-06-15 NOTE — Telephone Encounter (Signed)
PA request submitted. New Encounter created for follow up. For additional info see Prior Auth telephone encounter from 06/15/2022. 

## 2022-06-15 NOTE — Telephone Encounter (Signed)
Pharmacy Patient Advocate Encounter   Received notification from [facility] that prior authorization for Botox 200 Units is required/requested.    PA submitted on 06/15/2022 to (ins) [employer] via [employer] or [facility]) confirmation # [id number] Status is pending

## 2022-06-17 ENCOUNTER — Encounter: Payer: Self-pay | Admitting: Cardiology

## 2022-06-19 ENCOUNTER — Encounter: Payer: Self-pay | Admitting: Neurology

## 2022-06-21 ENCOUNTER — Encounter: Payer: Self-pay | Admitting: Neurology

## 2022-06-21 NOTE — Telephone Encounter (Signed)
Duplicate message. 

## 2022-06-22 NOTE — Telephone Encounter (Signed)
PT has primary coverage of traditional medicare-require no PA and will be [place] and Annette Stable  PT has [employer] for [employer] and spoke with Armani B. To verify precertification-No PA is required for JCODE J0585 and CPT [phone]- Buy and Bill  Please disregaurd the approval letter from [employer]-that was for pharmacy benefit-   PA NOT NEEDED- BUY and BILL.

## 2022-06-22 NOTE — Telephone Encounter (Signed)
See phone note confirming botox details. Patient will be called to schedule.

## 2022-06-22 NOTE — Telephone Encounter (Signed)
If cannot keep appt , place on waitlist as I do not have any availability at this time.

## 2022-06-22 NOTE — Telephone Encounter (Signed)
Pt declined 5/15 appt, Megan doesn't have anything else before mid-July.

## 2022-06-29 NOTE — Progress Notes (Unsigned)
    06/30/22: First botox since she received Sample from Dr. Lucia Gaskins    BOTOX PROCEDURE NOTE FOR MIGRAINE HEADACHE    Contraindications and precautions discussed with patient(above). Aseptic procedure was observed and patient tolerated procedure. Procedure performed by Butch Penny, NP  The condition has existed for more than 6 months, and pt does not have a diagnosis of ALS, Myasthenia Gravis or Lambert-Eaton Syndrome.  Risks and benefits of injections discussed and pt agrees to proceed with the procedure.  Written consent obtained  These injections are medically necessary.  These injections do not cause sedations or hallucinations which the oral therapies may cause.  Indication/Diagnosis: chronic migraine BOTOX(J0585) injection was performed according to protocol by [employer]. 200 units of BOTOX was dissolved into 4 cc NS.   NDC: [address]-40  Type of toxin: Botox  200 units x 1 vial Lot: [id number] Expiration: 07/2024 NDC: 7829-5621-30   Bacteriostatic 0.9% Sodium Chloride- 4 mL  Lot: [id number] Expiration: 05/17/2023 NDC: 6962-9528-41   Dx: L24.401     Description of procedure:  The patient was placed in a sitting position. The standard protocol was used for Botox as follows, with 5 units of Botox injected at each site:   -Procerus muscle, midline injection  -Corrugator muscle, bilateral injection  -Frontalis muscle, bilateral injection, with 2 sites each side, medial injection was performed in the upper one third of the frontalis muscle, in the region vertical from the medial inferior edge of the superior orbital rim. The lateral injection was again in the upper one third of the forehead vertically above the lateral limbus of the cornea, 1.5 cm lateral to the medial injection site.  -Temporalis muscle injection, 4 sites, bilaterally. The first injection was 3 cm above the tragus of the ear, second injection site was 1.5 cm to 3 cm up from the first injection site in line  with the tragus of the ear. The third injection site was 1.5-3 cm forward between the first 2 injection sites. The fourth injection site was 1.5 cm posterior to the second injection site.  -Occipitalis muscle injection, 3 sites, bilaterally. The first injection was done one half way between the occipital protuberance and the tip of the mastoid process behind the ear. The second injection site was done lateral and superior to the first, 1 fingerbreadth from the first injection. The third injection site was 1 fingerbreadth superiorly and medially from the first injection site.  -Cervical paraspinal muscle injection, 2 sites, bilateral knee first injection site was 1 cm from the midline of the cervical spine, 3 cm inferior to the lower border of the occipital protuberance. The second injection site was 1.5 cm superiorly and laterally to the first injection site.  -Trapezius muscle injection was performed at 3 sites, bilaterally. The first injection site was in the upper trapezius muscle halfway between the inflection point of the neck, and the acromion. The second injection site was one half way between the acromion and the first injection site. The third injection was done between the first injection site and the inflection point of the neck.   Will return for repeat injection in 3 months.   A 200 unit sof Botox was used, 155 units were injected, the rest of the Botox was wasted. The patient tolerated the procedure well, there were no complications of the above procedure.  Butch Penny, MSN, NP-C 06/29/2022, 4:56 PM [facility] [address] [place], [place] [address] [phone]

## 2022-06-30 ENCOUNTER — Other Ambulatory Visit (HOSPITAL_COMMUNITY): Payer: Self-pay

## 2022-06-30 ENCOUNTER — Other Ambulatory Visit: Payer: Self-pay

## 2022-06-30 ENCOUNTER — Ambulatory Visit (INDEPENDENT_AMBULATORY_CARE_PROVIDER_SITE_OTHER): Payer: Medicare Other | Admitting: Adult Health

## 2022-06-30 DIAGNOSIS — G43709 Chronic migraine without aura, not intractable, without status migrainosus: Secondary | ICD-10-CM | POA: Diagnosis not present

## 2022-06-30 MED ORDER — ONABOTULINUMTOXINA 200 UNITS IJ SOLR
155.0000 [IU] | Freq: Once | INTRAMUSCULAR | Status: AC
  Administered 2022-06-30: 155 [IU] via INTRAMUSCULAR

## 2022-06-30 MED ORDER — REPATHA SURECLICK 140 MG/ML ~~LOC~~ SOAJ: 140.0000 mg | SUBCUTANEOUS | 6 refills | Status: DC

## 2022-06-30 NOTE — Telephone Encounter (Signed)
Repatha sureclick rx sent to pharmacy

## 2022-06-30 NOTE — Progress Notes (Signed)
200 units x 1 vial Lot: [id number] Expiration: 07/2024 NDC: 4540-9811-91  Bacteriostatic 0.9% Sodium Chloride- 4 mL  Lot: [id number] Expiration: 05/17/2023 NDC: 6213-0865-78  Dx: I69.629  B/B Witnessed by Elana Alm

## 2022-07-02 ENCOUNTER — Other Ambulatory Visit (HOSPITAL_COMMUNITY): Payer: Self-pay

## 2022-07-14 ENCOUNTER — Ambulatory Visit (INDEPENDENT_AMBULATORY_CARE_PROVIDER_SITE_OTHER): Payer: Medicare Other | Admitting: Podiatry

## 2022-07-14 ENCOUNTER — Encounter: Payer: Self-pay | Admitting: Podiatry

## 2022-07-14 DIAGNOSIS — B351 Tinea unguium: Secondary | ICD-10-CM

## 2022-07-14 DIAGNOSIS — M79676 Pain in unspecified toe(s): Secondary | ICD-10-CM | POA: Diagnosis not present

## 2022-07-14 NOTE — Progress Notes (Signed)
Subjective:  Patient ID: Brandy Flowers, female    DOB: 07/03/1940,  MRN: [id number]   Brandy Flowers presents to clinic today for:  Chief Complaint  Patient presents with   Nail Problem    Discoloration on left hallux toenail. No injuries noted.   Diabetic Foot Care   [name], NP Last Visit- April 2024 Latest A1C- 6.2 (April 2024)  . Patient notes nails are thick, discolored, elongated and painful in shoegear when trying to ambulate.  Notes her left hallux nail has some black discoloration, but also remembers the toe being stepped on recently.  No current pain.  She is a retired Buyer, retail  PCP is [name], [name], NP.  Allergies  Allergen Reactions   Amiloride Shortness Of Breath   Blue Dyes (Parenteral) Shortness Of Breath, Rash and Other (See Comments)    Joint pain   Inderal La [Propranolol Hcl] Shortness Of Breath   Other Other (See Comments), Rash and Shortness Of Breath    Joint pain   Sulfa Antibiotics Anaphylaxis   Ace Inhibitors Nausea And Vomiting   Norvasc [Amlodipine] Nausea And Vomiting    Taking at this time. No problems   Calcium Channel Blockers Diarrhea   Catapres [Clonidine Hcl]     Decreased mental clarity    Clonidine Derivatives Other (See Comments)    Headache, flu like symptoms   Diovan [Valsartan] Cough   Hydralazine Other (See Comments)    Headache   Metformin And Related Diarrhea   Micardis [Telmisartan] Other (See Comments)    Hallucinations, increase in BP   Minoxidil Other (See Comments)    Fast heart rate, chest pain   Olmesartan Swelling    "Made me very ill to include lip swelling"    Plavix [Clopidogrel]     Increased LFT's    Serotonin Reuptake Inhibitors (Ssris) Other (See Comments)    Headache   Sulfur    Topamax [Topiramate] Other (See Comments)    Confusion   Tramadol     Nausea as a dog per pt   Tylenol With Codeine #3 [Acetaminophen-Codeine] Other (See Comments)    Lightheaded   Zetia  [Ezetimibe]     Liver  Function increase labs   Amoxicillin Diarrhea   Chlorthalidone     Dizziness.    Doxycycline Diarrhea   Fosamax [Alendronate Sodium]     Pt's teeth fell out while taking.    Lexapro [Escitalopram]     Brain Fog    Spironolactone Palpitations   Statins     Upset stomach     Review of Systems: Negative except as noted in the HPI.  Objective:  There were no vitals filed for this visit.  Brandy Flowers is a pleasant 82 y.o. female in NAD. AAO x 3.  Vascular Examination: Capillary refill time is 3-5 seconds to toes bilateral. Palpable pedal pulses b/l LE. Digital hair present b/l. No pedal edema b/l. Skin temperature gradient WNL b/l. No varicosities b/l. No cyanosis or clubbing noted b/l.   Dermatological Examination: Pedal skin with normal turgor, texture and tone b/l. No open wounds. No interdigital macerations b/l. Toenails x10 are 3mm thick, discolored, dystrophic with subungual debris. There is pain with compression of the nail plates.  They are elongated x10  Neurological Examination: Protective sensation intact bilateral LE.   Musculoskeletal Examination: Muscle strength 5/5 to all LE muscle groups b/l.   Assessment/Plan: 1. Pain due to onychomycosis of toenail     The mycotic  toenails were sharply debrided x10 with sterile nail nippers and a power debriding burr to decrease bulk/thickness and length.    Return in about 3 months (around 10/14/2022) for [facility] / nail trim.   Clerance Lav, DPM, FACFAS [facility]     [address] [place], [place] [address]                Office [phone]  Fax [phone]

## 2022-07-26 ENCOUNTER — Other Ambulatory Visit: Payer: Self-pay | Admitting: Behavioral Health

## 2022-07-26 DIAGNOSIS — F411 Generalized anxiety disorder: Secondary | ICD-10-CM

## 2022-07-30 ENCOUNTER — Encounter: Payer: Self-pay | Admitting: Neurology

## 2022-08-12 ENCOUNTER — Encounter: Payer: Self-pay | Admitting: Neurology

## 2022-08-31 NOTE — Progress Notes (Unsigned)
Cardiology Office Note:    Date:  09/01/2022   ID:  Brandy Flowers, DOB [birth year], MRN [id number]  PCP:  Lars Mage, NP  Cardiologist:  Norman Herrlich, MD    Referring MD: Lars Mage, NP    ASSESSMENT:    1. Essential hypertension   2. Renal artery stenosis (HCC)   3. Hyperlipidemia, unspecified hyperlipidemia type   4. Statin not tolerated    PLAN:    In order of problems listed above:  Overall she has done well her hypertension appears to be controlled but I am concerned about her self-monitoring given education about a validated device good technique and what she will do is check her blood pressures daily and record at home and bring to physicians visits with her Repeat renal artery duplex ordered Continue her multidrug regimen including atenolol losartan and hydralazine but she will split his is a twice daily medication Continue her nonstatin PCSK9 inhibitor requested from her PCP office   Next appointment: 1 year   Medication Adjustments/Labs and Tests Ordered: Current medicines are reviewed at length with the patient today.  Concerns regarding medicines are outlined above.  No orders of the defined types were placed in this encounter.  No orders of the defined types were placed in this encounter.    History of Present Illness:    Brandy Flowers is a 82 y.o. female with a hx of nonflow limiting renal artery stenosis with hypertension and hyperlipidemia with statin intolerance last seen 11/30/2021.  Compliance with diet, lifestyle and medications: Yes  Recently placed on once a day hydralazine 25 mg daily I told her to short acting drug and think she do better to split and take twice a day She tolerates her other antihypertensives On fortunately is using a wrist cuff and getting a blood pressure in the range of 140 systolic and I gave her instructions how to use an upper arm validated blood pressure device good technique measure blood pressure daily and  bring it to physicians offices with her. She will have a repeat follow-up renal artery duplex performed if she has severe flow-limiting stenosis and either renal dysfunction heart failure difficult to control hypertension PCI could be appropriate.  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   atenolol (TENORMIN) 50 MG tablet Take 50 mg by mouth in the morning and at bedtime.   cefdinir (OMNICEF) 300 MG capsule SMARTSIG:1 Tablet(s) By Mouth Every 12 Hours   Continuous Blood Gluc Receiver (FREESTYLE LIBRE 2 READER) DEVI    Continuous Blood Gluc Sensor (FREESTYLE LIBRE 2 SENSOR) MISC    diclofenac (FLECTOR) 1.3 % PTCH Place 1 patch onto the skin 2 (two) times daily  as needed (pain).   Evolocumab (REPATHA SURECLICK) 140 MG/ML SOAJ Inject 140 mg into the skin every 14 (fourteen) days.   lidocaine (LIDODERM) 5 % 1 patch daily as needed (pain).   LORazepam (ATIVAN) 0.5 MG tablet TAKE 1 TABLET BY MOUTH THREE TIMES A DAY   losartan (COZAAR) 50 MG tablet Take 50 mg by mouth 2 (two) times daily.   SYNTHROID 50 MCG tablet Take 100 mcg by mouth daily before breakfast. Must take (2) 50 mg can not take blue pills      EKGs/Labs/Other Studies Reviewed:    The following studies were reviewed today:  Cardiac Studies & Procedures       ECHOCARDIOGRAM  ECHOCARDIOGRAM  COMPLETE 12/18/2017  Narrative ** *[facility]* [address] [place], [place] [address] [phone]  ------------------------------------------------------------------- Transthoracic Echocardiography  Patient:    Brandy Flowers MR #:       [id number] Study Date: 12/18/2017 Gender:     F Age:        66 Height:     157.5 cm Weight:     63.5 kg BSA:        1.68 m^2 Pt. Status: Room:       [id number]  [name] [id number]     HKVQQV, [name] [id number] FIEPPIRJJ    OACZYS, [name] [id number] WFUXNATFT    DDUKGURK, [name] [id number] PERFORMING   [facility], Inpatient SONOGRAPHER  Belva Chimes  cc:  ------------------------------------------------------------------- LV EF: 65% -   70%  ------------------------------------------------------------------- Indications:      CVA 436.  ------------------------------------------------------------------- Study Conclusions  - Left ventricle: The cavity size was normal. Wall thickness was normal. Systolic function was vigorous. The estimated ejection fraction was in the range of 65% to 70%. Wall motion was normal; there were no regional wall motion abnormalities. Doppler parameters are consistent with abnormal left ventricular relaxation (grade 1 diastolic dysfunction). The E/e&' ratio is between 8-15, suggesting indeterminate LV filling pressure. - Aortic valve: Trileaflet. Sclerosis without stenosis. There was mild regurgitation. - Mitral valve: Calcified annulus. Mildly thickened leaflets . There was trivial regurgitation. - Left atrium: The atrium was normal in size. - Tricuspid valve: There was mild regurgitation. - Pulmonary arteries: PA peak pressure: 32 mm Hg (S). - Inferior vena cava: The vessel was normal in size. The respirophasic diameter changes were in the normal range (>= 50%), consistent with normal central venous pressure.  Impressions:  - LVEF 65-70%, normal wall  thickness, normal wall motion, grade 1 DD, indeterminate LV filling pressure, aortic valve sclerosis with mild AI, MAC with trivial MR, normal LA size, mild TR, RVSP 32 mmHg, normal IVC.  ------------------------------------------------------------------- Study data:  No prior study was available for comparison.  Study status:  Routine.  Procedure:  Transthoracic echocardiography. Image quality was adequate.  Study completion:  There were no complications.          Transthoracic echocardiography.  M-mode, complete 2D, spectral Doppler, and color Doppler.  Birthdate: Patient birthdate: 04-13-1940.  Age:  Patient is 82 yr old.  Sex: Gender: female.    BMI: 25.6 kg/m^2.  Blood pressure:     173/62 Patient status:  Inpatient.  Study date:  Study date: 12/18/2017. Study time: 02:51 PM.  Location:  Bedside.  -------------------------------------------------------------------  ------------------------------------------------------------------- Left ventricle:  The cavity size was normal. Wall thickness was normal. Systolic function was vigorous. The estimated ejection fraction was in the range of 65% to 70%. Wall motion was normal; there were no regional  wall motion abnormalities. Doppler parameters are consistent with abnormal left ventricular relaxation (grade 1 diastolic dysfunction). The E/e&' ratio is between 8-15, suggesting indeterminate LV filling pressure.  ------------------------------------------------------------------- Aortic valve:   Trileaflet. Sclerosis without stenosis.  Doppler: There was mild regurgitation.    VTI ratio of LVOT to aortic valve: 0.68. Valve area (VTI): 1.94 cm^2. Indexed valve area (VTI): 1.15 cm^2/m^2. Mean velocity ratio of LVOT to aortic valve: 0.73. Valve area (Vmean): 2.07 cm^2. Indexed valve area (Vmean): 1.23 cm^2/m^2. Mean gradient (S): 4 mm Hg.  ------------------------------------------------------------------- Mitral valve:   Calcified  annulus. Mildly thickened leaflets . Doppler:  There was trivial regurgitation.  ------------------------------------------------------------------- Left atrium:  The atrium was normal in size.  ------------------------------------------------------------------- Atrial septum:  No defect or patent foramen ovale was identified.  ------------------------------------------------------------------- Right ventricle:  The cavity size was normal. Wall thickness was normal. Systolic function was normal.  ------------------------------------------------------------------- Pulmonic valve:    The valve appears to be grossly normal. Doppler:  There was trivial regurgitation.  ------------------------------------------------------------------- Tricuspid valve:   Doppler:  There was mild regurgitation.  ------------------------------------------------------------------- Pulmonary artery:   The main pulmonary artery was normal-sized.  ------------------------------------------------------------------- Right atrium:  The atrium was normal in size.  ------------------------------------------------------------------- Pericardium:  There was no pericardial effusion.  ------------------------------------------------------------------- Systemic veins: Inferior vena cava: The vessel was normal in size. The respirophasic diameter changes were in the normal range (>= 50%), consistent with normal central venous pressure.  ------------------------------------------------------------------- Measurements  Left ventricle                            Value          Reference LV ID, ED, PLAX chordal           (L)     37.6  mm       43 - 52 LV ID, ES, PLAX chordal           (L)     20.6  mm       23 - 38 LV fx shortening, PLAX chordal            45    %        >=29 LV PW thickness, ED                       6.01  mm       --------- IVS/LV PW ratio, ED               (H)     1.8            <=1.3 Stroke  volume, 2D                         55    ml       --------- Stroke volume/bsa, 2D                     33    ml/m^2   --------- LV end-diastolic volume, 1-p A4C          93    ml       --------- LV ejection fraction, 1-p A4C             72    %        --------- LV end-diastolic volume/bsa, 1-p          55    ml/m^2   ---------  A4C LV end-diastolic volume, 2-p              68    ml       --------- LV end-systolic volume, 2-p               22    ml       --------- LV ejection fraction, 2-p                 68    %        --------- Stroke volume, 2-p                        47    ml       --------- LV end-diastolic volume/bsa, 2-p          40    ml/m^2   --------- LV end-systolic volume/bsa, 2-p           13    ml/m^2   --------- Stroke volume/bsa, 2-p                    27.7  ml/m^2   --------- LV e&', lateral                            5.66  cm/s     --------- LV E/e&', lateral                          11.75          --------- LV s&', lateral                            6.2   cm/s     --------- LV e&', medial                             4.68  cm/s     --------- LV E/e&', medial                           14.21          --------- LV e&', average                            5.17  cm/s     --------- LV E/e&', average                          12.86          ---------  Ventricular septum                        Value          Reference IVS thickness, ED                         10.8  mm       ---------  LVOT                                      Value          Reference LVOT ID,  S                                19    mm       --------- LVOT area                                 2.84  cm^2     --------- LVOT mean velocity, S                     62.9  cm/s     --------- LVOT VTI, S                               19.4  cm       ---------  Aortic valve                              Value          Reference Aortic valve mean velocity, S             86.1  cm/s     --------- Aortic valve VTI, S                        28.4  cm       --------- Aortic mean gradient, S                   4     mm Hg    --------- VTI ratio, LVOT/AV                        0.68           --------- Aortic valve area, VTI                    1.94  cm^2     --------- Aortic valve area/bsa, VTI                1.15  cm^2/m^2 --------- Velocity ratio, mean, LVOT/AV             0.73           --------- Aortic valve area, mean velocity          2.07  cm^2     --------- Aortic valve area/bsa, mean               1.23  cm^2/m^2 --------- velocity  Aorta                                     Value          Reference Aortic root ID, ED                        32    mm       --------- Ascending aorta ID, A-P, S                30    mm       ---------  Left atrium  Value          Reference LA ID, A-P, ES                            26    mm       --------- LA ID/bsa, A-P                            1.55  cm/m^2   <=2.2 LA volume, S                              44.5  ml       --------- LA volume/bsa, S                          26.5  ml/m^2   --------- LA volume, ES, 1-p A4C                    47.2  ml       --------- LA volume/bsa, ES, 1-p A4C                28.1  ml/m^2   --------- LA volume, ES, 1-p A2C                    43.2  ml       --------- LA volume/bsa, ES, 1-p A2C                25.7  ml/m^2   ---------  Mitral valve                              Value          Reference Mitral E-wave peak velocity               66.5  cm/s     --------- Mitral A-wave peak velocity               79.1  cm/s     --------- Mitral deceleration time          (H)     282   ms       150 - 230 Mitral E/A ratio, peak                    0.8            ---------  Pulmonary arteries                        Value          Reference PA pressure, S, DP                (H)     32    mm Hg    <=30  Tricuspid valve                           Value          Reference Tricuspid regurg peak velocity            244   cm/s      --------- Tricuspid peak RV-RA gradient             24  mm Hg    ---------  Systemic veins                            Value          Reference Estimated CVP                             8     mm Hg    ---------  Right ventricle                           Value          Reference RV ID, minor axis, ED, A4C base           36.2  mm       --------- RV ID, minor axis, ED, A4C mid            25.9  mm       --------- RV ID, major axis, ED, A4C                60.9  mm       55 - 91 TAPSE                                     23.6  mm       --------- RV pressure, S, DP                (H)     32    mm Hg    <=30 RV s&', lateral, S                         17.6  cm/s     ---------  Pulmonic valve                            Value          Reference Pulmonic valve peak velocity, S           109   cm/s     ---------  Legend: (L)  and  (H)  mark values outside specified reference range.  ------------------------------------------------------------------- Prepared and Electronically Authenticated by  Zoila Shutter MD 2019-11-03T16:20:35                 Recent Labs: No results found for requested labs within last 365 days.  Recent Lipid Panel    Component Value Date/Time   CHOL 205 (H) 12/18/2017 0353   TRIG 124 12/18/2017 0353   HDL 41 12/18/2017 0353   CHOLHDL 5.0 12/18/2017 0353   VLDL 25 12/18/2017 0353   LDLCALC 139 (H) 12/18/2017 0353    Physical Exam:    VS:  BP (!) 140/70 (BP Location: Left Arm, Patient Position: Sitting, Cuff Size: Normal)   Pulse 69   Ht 5\' 1"  (1.549 m)   Wt 101 lb 9.6 oz (46.1 kg)   SpO2 96%   BMI 19.20 kg/m     Wt Readings from Last 3 Encounters:  09/01/22 101 lb 9.6 oz (46.1 kg)  06/08/22 104 lb (47.2 kg)  11/30/21 104 lb (47.2 kg)     GEN:  Well nourished, well developed in no acute distress HEENT: Normal NECK: No  JVD; No carotid bruits LYMPHATICS: No lymphadenopathy CARDIAC: RRR, no murmurs, rubs, gallops RESPIRATORY:  Clear to auscultation  without rales, wheezing or rhonchi  ABDOMEN: Soft, non-tender, non-distended she has no renal artery bruit MUSCULOSKELETAL:  No edema; No deformity  SKIN: Warm and dry NEUROLOGIC:  Alert and oriented x 3 PSYCHIATRIC:  Normal affect    Signed, Norman Herrlich, MD  09/01/2022 1:14 PM    [facility]

## 2022-09-01 ENCOUNTER — Encounter: Payer: Self-pay | Admitting: Cardiology

## 2022-09-01 ENCOUNTER — Ambulatory Visit: Payer: Medicare Other | Attending: Cardiology | Admitting: Cardiology

## 2022-09-01 ENCOUNTER — Other Ambulatory Visit: Payer: Self-pay | Admitting: Cardiology

## 2022-09-01 VITALS — BP 140/70 | HR 69 | Ht 61.0 in | Wt 101.6 lb

## 2022-09-01 DIAGNOSIS — Z789 Other specified health status: Secondary | ICD-10-CM

## 2022-09-01 DIAGNOSIS — E785 Hyperlipidemia, unspecified: Secondary | ICD-10-CM | POA: Diagnosis present

## 2022-09-01 DIAGNOSIS — I10 Essential (primary) hypertension: Secondary | ICD-10-CM

## 2022-09-01 DIAGNOSIS — I701 Atherosclerosis of renal artery: Secondary | ICD-10-CM

## 2022-09-01 MED ORDER — HYDRALAZINE HCL 25 MG PO TABS: 12.5000 mg | ORAL_TABLET | Freq: Two times a day (BID) | ORAL | 3 refills | Status: DC

## 2022-09-01 NOTE — Patient Instructions (Addendum)
Medication Instructions:  Your physician has recommended you make the following change in your medication:   START: Hydralazine 12.5 mg twice daily  *If you need a refill on your cardiac medications before your next appointment, please call your pharmacy*   Lab Work: Your physician recommends that you return for lab work in:   Labs today: Lipids  If you have labs (blood work) drawn today and your tests are completely normal, you will receive your results only by: MyChart Message (if you have MyChart) OR A paper copy in the mail If you have any lab test that is abnormal or we need to change your treatment, we will call you to review the results.   Testing/Procedures: Your physician has requested that you have a renal artery duplex. During this test, an ultrasound is used to evaluate blood flow to the kidneys. Allow one hour for this exam. Do not eat after midnight the day before and avoid carbonated beverages. Take your medications as you usually do.    Follow-Up: At [facility], you and your health needs are our priority.  As part of our continuing mission to provide you with exceptional heart care, we have created designated Provider Care Teams.  These Care Teams include your primary Cardiologist (physician) and Advanced Practice Providers (APPs -  Physician Assistants and Nurse Practitioners) who all work together to provide you with the care you need, when you need it.  We recommend signing up for the patient portal called "MyChart".  Sign up information is provided on this After Visit Summary.  MyChart is used to connect with patients for Virtual Visits (Telemedicine).  Patients are able to view lab/test results, encounter notes, upcoming appointments, etc.  Non-urgent messages can be sent to your provider as well.   To learn more about what you can do with MyChart, go to [email or web address].    Your next appointment:   6 month(s)  Provider:   Norman Herrlich, MD     Other Instructions None  [facility]  Tips to measure your blood pressure correctly  To determine whether you have hypertension, a medical professional will take a blood pressure reading. How you prepare for the test, the position of your arm, and other factors can change a blood pressure reading by 10% or more. That could be enough to hide high blood pressure, start you on a drug you don't really need, or lead your doctor to incorrectly adjust your medications. National and international guidelines offer specific instructions for measuring blood pressure. If a doctor, nurse, or medical assistant isn't doing it right, don't hesitate to ask him or her to get with the guidelines. Here's what you can do to ensure a correct reading:  Don't drink a caffeinated beverage or smoke during the 30 minutes before the test.  Sit quietly for five minutes before the test begins.  During the measurement, sit in a chair with your feet on the floor and your arm supported so your elbow is at about heart level.  The inflatable part of the cuff should completely cover at least 80% of your upper arm, and the cuff should be placed on bare skin, not over a shirt.  Don't talk during the measurement.  Have your blood pressure measured twice, with a brief break in between. If the readings are different by 5 points or more, have it done a third time. There are times to break these rules. If you sometimes feel lightheaded when getting out of bed in the morning  or when you stand after sitting, you should have your blood pressure checked while seated and then while standing to see if it falls from one position to the next. Because blood pressure varies throughout the day, your doctor will rarely diagnose hypertension on the basis of a single reading. Instead, he or she will want to confirm the measurements on at least two occasions, usually within a few weeks of one another. The exception to this rule is if you have a blood  pressure reading of 180/110 mm Hg or higher. A result this high usually calls for prompt treatment. It's also a good idea to have your blood pressure measured in both arms at least once, since the reading in one arm (usually the right) may be higher than that in the left. A 2014 study in The American Journal of Medicine of nearly 3,400 people found average arm- to-arm differences in systolic blood pressure of about 5 points. The higher number should be used to make treatment decisions. In 2017, new guidelines from the American Heart Association, the [employer] of Cardiology, and nine other health organizations lowered the diagnosis of high blood pressure to 130/80 mm Hg or higher for all adults. The guidelines also redefined the various blood pressure categories to now include normal, elevated, Stage 1 hypertension, Stage 2 hypertension, and hypertensive crisis (see "Blood pressure categories"). Blood pressure categories  Blood pressure category SYSTOLIC (upper number)  DIASTOLIC (lower number)  Normal Less than 120 mm Hg and Less than 80 mm Hg  Elevated 120-129 mm Hg and Less than 80 mm Hg  High blood pressure: Stage 1 hypertension 130-139 mm Hg or 80-89 mm Hg  High blood pressure: Stage 2 hypertension 140 mm Hg or higher or 90 mm Hg or higher  Hypertensive crisis (consult your doctor immediately) Higher than 180 mm Hg and/or Higher than 120 mm Hg  Source: American Heart Association and American Stroke Association. For more on getting your blood pressure under control, buy Controlling Your Blood Pressure, a Special Health Report from [facility].

## 2022-09-02 ENCOUNTER — Encounter: Payer: Self-pay | Admitting: Adult Health

## 2022-09-02 LAB — LIPID PANEL
Chol/HDL Ratio: 3.6 ratio (ref 0.0–4.4)
Cholesterol, Total: 194 mg/dL (ref 100–199)
HDL: 54 mg/dL (ref >39)
LDL Chol Calc (NIH): 113 mg/dL — ABNORMAL HIGH (ref 0–99)
Triglycerides: 151 mg/dL — ABNORMAL HIGH (ref 0–149)
VLDL Cholesterol Cal: 27 mg/dL (ref 5–40)

## 2022-09-03 ENCOUNTER — Other Ambulatory Visit: Payer: Self-pay

## 2022-09-03 ENCOUNTER — Encounter: Payer: Self-pay | Admitting: Cardiology

## 2022-09-03 ENCOUNTER — Telehealth: Payer: Self-pay

## 2022-09-03 NOTE — Telephone Encounter (Signed)
Received the following message from Dr. Dulce Sellar below:  "She called left a message and asked me my opinion of her lipid profile and I think it is a good result and continue her injectable."  Patient was informed of the result and had no further questions at this time.

## 2022-09-05 ENCOUNTER — Encounter: Payer: Self-pay | Admitting: Neurology

## 2022-09-08 ENCOUNTER — Encounter: Payer: Self-pay | Admitting: Cardiology

## 2022-09-11 ENCOUNTER — Encounter: Payer: Self-pay | Admitting: Cardiology

## 2022-09-13 ENCOUNTER — Telehealth: Payer: Self-pay

## 2022-09-13 ENCOUNTER — Other Ambulatory Visit (HOSPITAL_COMMUNITY): Payer: Self-pay

## 2022-09-13 NOTE — Telephone Encounter (Signed)
Pharmacy Patient Advocate Encounter   Received notification from Physician's Office that prior authorization for REPATHA is required/requested.   Insurance verification completed.   The patient is insured through [employer] .   Per test claim: PA required; PA submitted to [facility] via CoverMyMeds Key/confirmation #/EOC [id number] Status is pending

## 2022-09-13 NOTE — Telephone Encounter (Signed)
PA request has been Submitted. New Encounter created for follow up. For additional info see Pharmacy Prior Auth telephone encounter from 09/13/22.

## 2022-09-14 ENCOUNTER — Other Ambulatory Visit: Payer: Self-pay | Admitting: Cardiology

## 2022-09-15 ENCOUNTER — Other Ambulatory Visit (HOSPITAL_COMMUNITY): Payer: Self-pay

## 2022-09-15 NOTE — Telephone Encounter (Addendum)
Pharmacy Patient Advocate Encounter  Received notification from [facility] that Prior Authorization for REPATHA has been APPROVED from 09/14/22 to 09/14/23.

## 2022-09-29 ENCOUNTER — Ambulatory Visit: Payer: Medicare Other | Attending: Cardiology

## 2022-09-29 ENCOUNTER — Ambulatory Visit: Payer: Medicare Other | Admitting: Adult Health

## 2022-09-29 DIAGNOSIS — Z789 Other specified health status: Secondary | ICD-10-CM | POA: Insufficient documentation

## 2022-09-29 DIAGNOSIS — I701 Atherosclerosis of renal artery: Secondary | ICD-10-CM | POA: Insufficient documentation

## 2022-09-29 DIAGNOSIS — I10 Essential (primary) hypertension: Secondary | ICD-10-CM | POA: Diagnosis present

## 2022-09-29 DIAGNOSIS — E785 Hyperlipidemia, unspecified: Secondary | ICD-10-CM | POA: Diagnosis not present

## 2022-09-30 ENCOUNTER — Ambulatory Visit (INDEPENDENT_AMBULATORY_CARE_PROVIDER_SITE_OTHER): Payer: Medicare Other | Admitting: Adult Health

## 2022-09-30 ENCOUNTER — Encounter: Payer: Self-pay | Admitting: Cardiology

## 2022-09-30 ENCOUNTER — Ambulatory Visit: Payer: Medicare Other | Admitting: Adult Health

## 2022-09-30 DIAGNOSIS — G43709 Chronic migraine without aura, not intractable, without status migrainosus: Secondary | ICD-10-CM | POA: Diagnosis not present

## 2022-09-30 DIAGNOSIS — G8929 Other chronic pain: Secondary | ICD-10-CM | POA: Diagnosis not present

## 2022-09-30 MED ORDER — ONABOTULINUMTOXINA 200 UNITS IJ SOLR
155.0000 [IU] | Freq: Once | INTRAMUSCULAR | Status: AC
Start: 2022-09-30 — End: 2022-09-30
  Administered 2022-09-30: 155 [IU] via INTRAMUSCULAR

## 2022-09-30 NOTE — Progress Notes (Signed)
09/30/22: Reports that botox has been helping. Down to about 2 headaches a week. Severity is better. Has had a daily headache for the last 2 weeks. Most likely d/t botox wearing off.   06/30/22: First botox since she received Sample from Dr. Lucia Gaskins    BOTOX PROCEDURE NOTE FOR MIGRAINE HEADACHE    Contraindications and precautions discussed with patient(above). Aseptic procedure was observed and patient tolerated procedure. Procedure performed by Butch Penny, NP  The condition has existed for more than 6 months, and pt does not have a diagnosis of ALS, Myasthenia Gravis or Lambert-Eaton Syndrome.  Risks and benefits of injections discussed and pt agrees to proceed with the procedure.  Written consent obtained  These injections are medically necessary.  These injections do not cause sedations or hallucinations which the oral therapies may cause.  Indication/Diagnosis: chronic migraine BOTOX(J0585) injection was performed according to protocol by [employer]. 200 units of BOTOX was dissolved into 4 cc NS.   NDC: [address]-62  Type of toxin: Botox  Botox- 200 units x 1vial Lot: [id number] Expiration: 12/2024 NDC:0023-3921-02   Bacteriostatic 0.9% Sodium Chloride- 4mL total [id number] Expiration: 05/17/2023 NDC: 4010-2725-36     Description of procedure:  The patient was placed in a sitting position. The standard protocol was used for Botox as follows, with 5 units of Botox injected at each site:   -Procerus muscle, midline injection  -Corrugator muscle, bilateral injection  -Frontalis muscle, bilateral injection, with 2 sites each side, medial injection was performed in the upper one third of the frontalis muscle, in the region vertical from the medial inferior edge of the superior orbital rim. The lateral injection was again in the upper one third of the forehead vertically above the lateral limbus of the cornea, 1.5 cm lateral to the medial injection site.  -Temporalis muscle  injection, 4 sites, bilaterally. The first injection was 3 cm above the tragus of the ear, second injection site was 1.5 cm to 3 cm up from the first injection site in line with the tragus of the ear. The third injection site was 1.5-3 cm forward between the first 2 injection sites. The fourth injection site was 1.5 cm posterior to the second injection site.  -Occipitalis muscle injection, 3 sites, bilaterally. The first injection was done one half way between the occipital protuberance and the tip of the mastoid process behind the ear. The second injection site was done lateral and superior to the first, 1 fingerbreadth from the first injection. The third injection site was 1 fingerbreadth superiorly and medially from the first injection site.  -Cervical paraspinal muscle injection, 2 sites, bilateral knee first injection site was 1 cm from the midline of the cervical spine, 3 cm inferior to the lower border of the occipital protuberance. The second injection site was 1.5 cm superiorly and laterally to the first injection site.  -Trapezius muscle injection was performed at 3 sites, bilaterally. The first injection site was in the upper trapezius muscle halfway between the inflection point of the neck, and the acromion. The second injection site was one half way between the acromion and the first injection site. The third injection was done between the first injection site and the inflection point of the neck.   Will return for repeat injection in 3 months.   A 200 unit sof Botox was used, 155 units were injected, the rest of the Botox was wasted. The patient tolerated the procedure well, there were no complications of the above procedure.  Butch Penny,  MSN, NP-C 09/30/2022, 10:27 AM [facility] [address] [place], [place] [address] [phone]

## 2022-09-30 NOTE — Progress Notes (Signed)
Botox- 200 units x 1vial Lot: [id number] Expiration: 12/2024 NDC:0023-3921-02  Bacteriostatic 0.9% Sodium Chloride- 4mL total [id number] Expiration: 05/17/2023 NDC: 8657-8469-62  Dx:G43.709 BB  Witnessed by: Alveria Apley

## 2022-10-01 ENCOUNTER — Other Ambulatory Visit: Payer: Self-pay | Admitting: Behavioral Health

## 2022-10-01 ENCOUNTER — Encounter: Payer: Self-pay | Admitting: Neurology

## 2022-10-01 DIAGNOSIS — F411 Generalized anxiety disorder: Secondary | ICD-10-CM

## 2022-10-02 ENCOUNTER — Other Ambulatory Visit: Payer: Self-pay | Admitting: Behavioral Health

## 2022-10-02 DIAGNOSIS — F411 Generalized anxiety disorder: Secondary | ICD-10-CM

## 2022-10-04 ENCOUNTER — Telehealth: Payer: Self-pay | Admitting: Behavioral Health

## 2022-10-04 NOTE — Telephone Encounter (Signed)
Had received RF requests from pharmacy, but it is too early to fill and requests were refused. Called patient and she thought it was too early, but was wondering why there were 2 RF requests. I'm not sure why the pharmacy sent them so early. Patient has medication.

## 2022-10-04 NOTE — Telephone Encounter (Signed)
Patient lvm at 11:47 stating that under visits they are refill for 8/16 and 8/17. She doesn't have any refills and inquired about why those refills were there. Ph: [phone]

## 2022-10-05 ENCOUNTER — Encounter: Payer: Self-pay | Admitting: Cardiology

## 2022-10-08 ENCOUNTER — Ambulatory Visit (INDEPENDENT_AMBULATORY_CARE_PROVIDER_SITE_OTHER): Payer: Medicare Other | Admitting: Podiatry

## 2022-10-08 DIAGNOSIS — M79676 Pain in unspecified toe(s): Secondary | ICD-10-CM | POA: Diagnosis not present

## 2022-10-08 DIAGNOSIS — B351 Tinea unguium: Secondary | ICD-10-CM | POA: Diagnosis not present

## 2022-10-08 NOTE — Progress Notes (Unsigned)
Subjective:  Patient ID: Brandy Flowers, female    DOB: [birth year],  MRN: [id number]   Brandy Flowers presents to clinic today for:  Chief Complaint  Patient presents with   Diabetes    [facility] BS - 120 A1C - 6.9   Nail Problem    LEFT HALLUX NAIL DISCOLORATION   Callouses    LESION 4TH LEFT   Patient notes nails are thick, discolored, elongated and painful in shoegear when trying to ambulate.    PCP is [name], Alma Downs, NP.  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 No problems   Calcium Channel Blockers Diarrhea   Catapres [Clonidine Hcl]     Decreased mental clarity    Clonidine Derivatives Other (See Comments)    Headache, flu like symptoms   Diovan [Valsartan] Cough   Hydralazine Other (See Comments)    Headache   Metformin And Related Diarrhea   Micardis [Telmisartan] Other (See Comments)    Hallucinations, increase in BP   Minoxidil Other (See Comments)    Fast heart rate, chest pain   Olmesartan Swelling    "Made me very ill to include lip swelling"    Plavix [Clopidogrel]     Increased LFT's    Serotonin Reuptake Inhibitors  (Ssris) Other (See Comments)    Headache   Sulfur    Topamax [Topiramate] Other (See Comments)    Confusion   Tramadol     Nausea as a dog per pt   Tylenol With Codeine #3 [Acetaminophen-Codeine] Other (See Comments)    Lightheaded   Zetia [Ezetimibe]     Liver  Function increase labs   Amoxicillin Diarrhea   Chlorthalidone     Dizziness.    Doxycycline Diarrhea   Fosamax [Alendronate Sodium]     Pt's teeth fell out while taking.    Lexapro [Escitalopram]     Brain Fog    Spironolactone Palpitations   Statins     Upset stomach     Review of Systems: Negative except as noted in the HPI.  Objective:  There were no vitals filed for this visit.  Brandy Flowers is a pleasant 82 y.o. female in NAD. AAO x 3.  Vascular Examination: Capillary refill time is 3-5 seconds to toes bilateral.  Diminished pedal pulses b/l LE. Digital hair sparse b/l.  Skin temperature gradient WNL b/l. No varicosities b/l. No cyanosis noted b/l.   Dermatological Examination: Pedal skin with  decreased turgor, texture and tone b/l. No open wounds. No interdigital macerations b/l. Toenails x10 are 3mm thick, discolored, dystrophic with subungual debris. There is pain with compression of the nail plates.  They are elongated x10.  There is a small pinch callus on the plantar medial aspect of the left fourth toe.  Assessment/Plan: 1. Pain due to onychomycosis of toenail     The mycotic toenails were sharply debrided x10 with sterile nail nippers and a power debriding burr to decrease bulk/thickness and length.  The small pinch callus was shaved with a sterile #313 blade as a courtesy.  Return in about 3 months (around 01/08/2023) for [facility].   Clerance Lav, DPM, FACFAS [facility]     [address] [place], [place] [address]                Office [phone]  Fax [phone]

## 2022-10-14 ENCOUNTER — Ambulatory Visit: Payer: Medicare Other | Admitting: Podiatry

## 2022-10-24 ENCOUNTER — Encounter: Payer: Self-pay | Admitting: Neurology

## 2022-10-25 NOTE — Telephone Encounter (Signed)
Lets make sure she is not taking daily over-the-counter medication as that may be causing a rebound headache?

## 2022-10-26 ENCOUNTER — Other Ambulatory Visit: Payer: Self-pay | Admitting: *Deleted

## 2022-10-26 NOTE — Telephone Encounter (Signed)
noted 

## 2022-11-03 ENCOUNTER — Other Ambulatory Visit: Payer: Self-pay | Admitting: Behavioral Health

## 2022-11-03 DIAGNOSIS — F411 Generalized anxiety disorder: Secondary | ICD-10-CM

## 2022-11-15 ENCOUNTER — Encounter: Payer: Self-pay | Admitting: Neurology

## 2022-11-22 ENCOUNTER — Ambulatory Visit: Payer: Medicare Other | Admitting: Behavioral Health

## 2022-11-29 ENCOUNTER — Encounter: Payer: Self-pay | Admitting: Neurology

## 2022-12-07 ENCOUNTER — Ambulatory Visit: Payer: Medicare Other | Admitting: Behavioral Health

## 2022-12-07 ENCOUNTER — Encounter: Payer: Self-pay | Admitting: Behavioral Health

## 2022-12-07 DIAGNOSIS — F411 Generalized anxiety disorder: Secondary | ICD-10-CM | POA: Diagnosis not present

## 2022-12-07 MED ORDER — LORAZEPAM 0.5 MG PO TABS
0.5000 mg | ORAL_TABLET | Freq: Three times a day (TID) | ORAL | 2 refills | Status: DC
Start: 2022-12-07 — End: 2023-04-08

## 2022-12-07 NOTE — Progress Notes (Signed)
[facility]  Patient ID: Brandy Flowers,  MRN: [id number]  PCP: Lars Mage, NP  Date of Evaluation: 12/07/2022 Time spent:30 minutes  Chief Complaint:  Chief Complaint   Anxiety; Depression; Follow-up; Medication Refill; Patient Education     HISTORY/CURRENT STATUS: HPI  "Brandy Flowers", 82 year old female presents to this office for initial visit and to establish care.  No changes this visit. No complications from reducing benzodiazapine use. A friend of the family is present with her consents. Friend provides transportation and assist with medical appointments.  Says she is doing well but get very discouraged about politics recently. Following up soon with PCP for migraines and pain behind the ear. Negative PHQ-2. Says her depression is 0/10 but anxiety is 3/10. Says she does sleep 7 hours per night. She denies mania or psychosis. No delirium, no auditory or visual hallucinations reported. No SI/HI.    Prior psychiatric medications: Klonopin Ativan Clonidine-Headache, flu like symptoms Lexapro-brain fog  Individual Medical History/ Review of Systems: Changes? :No   Allergies: Amiloride, Blue dyes (parenteral), Inderal la [propranolol hcl], Other, Sulfa antibiotics, Ace inhibitors, Norvasc [amlodipine], Calcium channel blockers, Catapres [clonidine hcl], Clonidine derivatives, Diovan [valsartan], Hydralazine, Metformin and related, Micardis [telmisartan], Minoxidil, Olmesartan, Plavix [clopidogrel], Serotonin reuptake inhibitors (ssris), Sulfur, Topamax [topiramate], Tramadol, Tylenol with codeine #3 [acetaminophen-codeine], Zetia [ezetimibe], Amoxicillin, Chlorthalidone, Doxycycline, Fosamax [alendronate sodium], Lexapro [escitalopram], Spironolactone, and Statins  Current Medications:  Current Outpatient Medications:    aspirin 325 MG tablet, Take 650 mg by mouth daily., Disp: , Rfl:    atenolol (TENORMIN) 50 MG tablet, Take 50 mg by mouth in the morning and at  bedtime., Disp: , Rfl:    Continuous Blood Gluc Receiver (FREESTYLE LIBRE 2 READER) DEVI, , Disp: , Rfl:    Continuous Blood Gluc Sensor (FREESTYLE LIBRE 2 SENSOR) MISC, , Disp: , Rfl:    diclofenac (FLECTOR) 1.3 % PTCH, Place 1 patch onto the skin 2 (two) times daily as needed (pain)., Disp: , Rfl:    Evolocumab (REPATHA SURECLICK) 140 MG/ML SOAJ, INJECT 140 MG INTO THE SKIN EVERY 14 (FOURTEEN) DAYS., Disp: 2 mL, Rfl: 6   lidocaine (LIDODERM) 5 %, 1 patch daily as needed (pain)., Disp: , Rfl:    LORazepam (ATIVAN) 0.5 MG tablet, Take 1 tablet (0.5 mg total) by mouth 3 (three) times daily., Disp: 180 tablet, Rfl: 2   losartan (COZAAR) 50 MG tablet, Take 50 mg by mouth 2 (two) times daily., Disp: , Rfl:    meclizine (ANTIVERT) 25 MG tablet, Take 25 mg by mouth 2 (two) times daily., Disp: , Rfl:    SYNTHROID 50 MCG tablet, Take 100 mcg by mouth daily before breakfast. Must take (2) 50 mg can not take blue pills, Disp: , Rfl:  Medication Side Effects: none  Family Medical/ Social History: Changes? No  MENTAL HEALTH EXAM:  There were no vitals taken for this visit.There is no height or weight on file to calculate BMI.  General Appearance: Casual, Neat, and Well Groomed  Eye Contact:  Good  Speech:  Clear and Coherent  Volume:  Normal  Mood:  Negative  Affect:  Appropriate  Thought Process:  Coherent  Orientation:  Full (Time, Place, and Person)  Thought Content: WDL   Suicidal Thoughts:  No  Homicidal Thoughts:  No  Memory:  WNL  Judgement:  Good  Insight:  Good  Psychomotor Activity:  Normal  Concentration:  Concentration: Good  Recall:  Good  Fund of Knowledge: Good  Language: Good  Assets:  Desire for Improvement  ADL's:  Intact  Cognition: WNL  Prognosis:  Good    DIAGNOSES:    ICD-10-CM   1. Generalized anxiety disorder  F41.1 LORazepam (ATIVAN) 0.5 MG tablet      Receiving Psychotherapy: No    RECOMMENDATIONS:   Greater than 50% of 30 min face to face time with  patient was spent on counseling and coordination of care. She is still cognitively sharp. Ambulating with walker. We discuss her continue concerns over her medical health issues. She continues to follow up with PCP regularly   I reinforced  counseled her again today about being very careful when using benzo and not taking same time with pain medication. We discussed weaning down some on her benzo and she wanted to reassess next visit. She reports medication is only thing that prevents her from having panic.   We agreed today: To continue  Ativan to 0.5 mg thee times daily as needed. Will try to reduce to 0.25 three times daily if possible next visit.  Discussed potential benefits, risks, and side effects of  To follow up in 6 months to reassess. Provided emergency contact information Reviewed PDMP     Joan Flores, NP

## 2022-12-09 ENCOUNTER — Telehealth: Payer: Self-pay | Admitting: Behavioral Health

## 2022-12-09 NOTE — Telephone Encounter (Signed)
Calla states she is dizzy, weak, has headaches, she believes its because of Ativan. She does not think hear dizziness is from her headaches because she has been getting botox. She is willing to try the lower dose, but ultimately wants to dc Ativan. She has been taking the a half of the half.

## 2022-12-09 NOTE — Telephone Encounter (Signed)
Brandy Flowers stating she was in the office on Tuesday. She is requesting to change the Ativan to the lower dosage. She feels it is the problem, after speaking with the pharmacy.   Please advise. [phone]

## 2022-12-09 NOTE — Telephone Encounter (Signed)
Notified pt. She mentioned she is allergic to blue dye and hopes the smaller Ativan dose is white.

## 2022-12-13 NOTE — Telephone Encounter (Signed)
Pt Lvm 110/27 @ 8:11a stating she had cut the Ativan in half and was having terrible headaches and dizziness.  She said the pharmacist suggested that she take the whole pill but reduce the number of times a day she takes it from TID to BID.  But pharmacist told her to check with Arlys John.  Next appt 2/21

## 2022-12-13 NOTE — Telephone Encounter (Signed)
Brandy Flowers says when she started to cut the Ativan in half, two days later she began to develop a headache, dizziness. She read up on ativan withdrawal and believes that's what is going on. She spoke with her pharmacist, she suggested taking 0.5 mg BID. She says on Saturday and Sunday she took it 6 am and 6 pm and she did not have a headache or dizziness.  She wants to know if she can take 0.5 BID

## 2022-12-13 NOTE — Telephone Encounter (Signed)
Pt.notified

## 2022-12-26 ENCOUNTER — Encounter: Payer: Self-pay | Admitting: Neurology

## 2022-12-29 ENCOUNTER — Ambulatory Visit: Payer: Medicare Other | Admitting: Adult Health

## 2022-12-31 ENCOUNTER — Telehealth: Payer: Self-pay | Admitting: Behavioral Health

## 2022-12-31 NOTE — Telephone Encounter (Signed)
Error

## 2023-01-02 DIAGNOSIS — F411 Generalized anxiety disorder: Secondary | ICD-10-CM

## 2023-01-02 NOTE — Telephone Encounter (Signed)
error 

## 2023-01-06 ENCOUNTER — Ambulatory Visit (INDEPENDENT_AMBULATORY_CARE_PROVIDER_SITE_OTHER): Payer: Medicare Other | Admitting: Podiatry

## 2023-01-06 DIAGNOSIS — B351 Tinea unguium: Secondary | ICD-10-CM

## 2023-01-06 DIAGNOSIS — M79674 Pain in right toe(s): Secondary | ICD-10-CM | POA: Diagnosis not present

## 2023-01-06 DIAGNOSIS — M79675 Pain in left toe(s): Secondary | ICD-10-CM

## 2023-01-06 NOTE — Progress Notes (Signed)
Subjective:  Patient ID: Brandy Flowers, female    DOB: [birth year],  MRN: [id number]  Brandy Flowers presents to clinic today for:  Chief Complaint  Patient presents with   Diabetes    DFC/ EXAM  A1C -7.1    Patient notes nails are thick, discolored, elongated and painful in shoegear when trying to ambulate.  She uses a walker to assist with ambulation.  Has a pinch callus left 4th toe  PCP is [name], Devin B, NP.  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%   Sigmoid diverticulosis    Sinus problem    Splenic artery aneurysm (HCC)    7mm   Stroke (HCC) 12/16/2017   Thyroid disease     Past Surgical History:  Procedure Laterality Date   BREAST REDUCTION SURGERY  2007   CATARACT EXTRACTION Bilateral    CHOLECYSTECTOMY     COLONOSCOPY     RADIOLOGY WITH ANESTHESIA N/A 05/14/2021   Procedure: MRI BRAIN WITH AND WITHOUT CONTRAST WITH ANESTHESIA;  Surgeon: Radiologist, Medication, MD;  Location: [facility] OR;  Service: Radiology;  Laterality: N/A;   RADIOLOGY WITH ANESTHESIA N/A 05/14/2021   Procedure: MRV WITHOUT CONTRAST WITH ANESTHESIA;  Surgeon: Radiologist, Medication, MD;  Location: [facility] OR;  Service: Radiology;  Laterality: N/A;   TONSILLECTOMY AND ADENOIDECTOMY     TOOTH EXTRACTION  2019   with anesthesia   WISDOM TOOTH EXTRACTION      Allergies  Allergen Reactions   Amiloride Shortness Of Breath   Blue Dyes (Parenteral) Shortness Of Breath, Rash and Other (See Comments)    Joint pain   Inderal La [Propranolol Hcl] Shortness Of Breath   Other Other (See Comments), Rash and Shortness Of Breath    Joint pain   Sulfa Antibiotics Anaphylaxis   Ace Inhibitors Nausea And Vomiting   Norvasc  [Amlodipine] Nausea And Vomiting    Taking at this time. No problems   Calcium Channel Blockers Diarrhea   Catapres [Clonidine Hcl]     Decreased mental clarity    Clonidine Derivatives Other (See Comments)    Headache, flu like symptoms   Diovan [Valsartan] Cough   Hydralazine Other (See Comments)    Headache   Metformin And Related Diarrhea   Micardis [Telmisartan] Other (See Comments)    Hallucinations, increase in BP   Minoxidil Other (See Comments)    Fast heart rate, chest pain   Olmesartan Swelling    "Made me very ill to include lip swelling"    Plavix [Clopidogrel]     Increased LFT's    Serotonin Reuptake Inhibitors (Ssris) Other (See Comments)    Headache   Sulfur    Topamax [Topiramate] Other (See Comments)    Confusion   Tramadol     Nausea as a dog per pt   Tylenol With Codeine #3 [Acetaminophen-Codeine] Other (See Comments)    Lightheaded   Zetia [Ezetimibe]     Liver  Function increase labs   Amoxicillin Diarrhea   Chlorthalidone     Dizziness.    Doxycycline Diarrhea   Fosamax [Alendronate Sodium]     Pt's teeth  fell out while taking.    Lexapro [Escitalopram]     Brain Fog    Spironolactone Palpitations   Statins     Upset stomach    Review of Systems: Negative except as noted in the HPI.  Objective:  Brandy Flowers is a pleasant 82 y.o. female in NAD. AAO x 3.  Vascular Examination: Capillary refill time is 3-5 seconds to toes bilateral. Palpable pedal pulses b/l LE. Digital hair present b/l.  Skin temperature gradient WNL b/l. No varicosities b/l. No cyanosis noted b/l.   Dermatological Examination: Pedal skin with normal turgor, texture and tone b/l. No open wounds. No interdigital macerations b/l. Toenails x10 are 3mm thick, discolored, dystrophic with subungual debris. There is pain with compression of the nail plates.  They are elongated x10.  Thin ledge of hyperkeratotic skin on plantarmedial left 4th toe at distal  pulp.  Assessment/Plan: 1. Pain due to onychomycosis of toenails of both feet     The mycotic toenails were sharply debrided x10 with sterile nail nippers and a power debriding burr to decrease bulk/thickness and length.    The hyperkeratotic lesion on distal 4th toe was shaved as courtesy.  Return in about 3 months (around 04/08/2023) for [facility].   Clerance Lav, DPM, FACFAS [facility]     [address] [place], [place] [address]                Office [phone]  Fax [phone]

## 2023-01-11 ENCOUNTER — Ambulatory Visit (INDEPENDENT_AMBULATORY_CARE_PROVIDER_SITE_OTHER): Payer: Medicare Other | Admitting: Adult Health

## 2023-01-11 DIAGNOSIS — G43709 Chronic migraine without aura, not intractable, without status migrainosus: Secondary | ICD-10-CM | POA: Diagnosis not present

## 2023-01-11 MED ORDER — ONABOTULINUMTOXINA 200 UNITS IJ SOLR
155.0000 [IU] | Freq: Once | INTRAMUSCULAR | Status: AC
Start: 2023-01-11 — End: 2023-01-11
  Administered 2023-01-11: 155 [IU] via INTRAMUSCULAR

## 2023-01-11 NOTE — Progress Notes (Signed)
Botox- 200 units x 1 vial Lot: [id number] Expiration: 04/2025 NDC: 1478-2956-21  Bacteriostatic 0.9% Sodium Chloride- 4mL total [id number] Expiratin: 05/17/23 NDC: 9629-5284-13  Dx: G43.709 BB  Witnessed by:  Elane Fritz and Toma Copier

## 2023-01-11 NOTE — Progress Notes (Signed)
01/11/23:  botox works for the first 1-2 months and she will start have breakthrough headaches.  She reports that this cycle her headaches were worse.  She states in the past she was on Emgality. She only took this for a couple of months because she was given samples.  She feels that the combination of Emgality and Botox worked well.  Her primary care gave her Fioricet and that works well to resolve her headaches typically.  I did caution her about rebound headaches.  Patient states that she did have a fall on Sunday.  She states that she did not hit her head.  Her brother who is an Char[employer] fundraiser came to check on her.  She has not had any additional symptoms since the fall.  She does notice that she has a bruise today on the right hand.  She is not sure if that is from the fall or from something else.  Did advise that if the bruise worsen she should see her primary care provider.   09/30/22: Reports that botox has been helping. Down to about 2 headaches a week. Severity is better. Has had a daily headache for the last 2 weeks. Most likely d/t botox wearing off.   06/30/22: First botox since she received Sample from Dr. Lucia Gaskins    BOTOX PROCEDURE NOTE FOR MIGRAINE HEADACHE    Contraindications and precautions discussed with patient(above). Aseptic procedure was observed and patient tolerated procedure. Procedure performed by Butch Penny, NP  The condition has existed for more than 6 months, and pt does not have a diagnosis of ALS, Myasthenia Gravis or Lambert-Eaton Syndrome.  Risks and benefits of injections discussed and pt agrees to proceed with the procedure.  Written consent obtained  These injections are medically necessary.  These injections do not cause sedations or hallucinations which the oral therapies may cause.  Indication/Diagnosis: chronic migraine BOTOX([id number]) injection was performed according to protocol by [employer]. 200 units of BOTOX was dissolved into 4 cc NS.   NDC:  [address]-30  Type of toxin: Botox  Botox- 200 units x 1 vial Lot: [id number] Expiration: 04/2025 NDC: 9629-5284-13   Bacteriostatic 0.9% Sodium Chloride- 4mL total [id number] Expiratin: 05/17/23 NDC: 5366-4403-47   Dx: Q25.956       Description of procedure:  The patient was placed in a sitting position. The standard protocol was used for Botox as follows, with 5 units of Botox injected at each site:   -Procerus muscle, midline injection  -Corrugator muscle, bilateral injection  -Frontalis muscle, bilateral injection, with 2 sites each side, medial injection was performed in the upper one third of the frontalis muscle, in the region vertical from the medial inferior edge of the superior orbital rim. The lateral injection was again in the upper one third of the forehead vertically above the lateral limbus of the cornea, 1.5 cm lateral to the medial injection site.  -Temporalis muscle injection, 4 sites, bilaterally. The first injection was 3 cm above the tragus of the ear, second injection site was 1.5 cm to 3 cm up from the first injection site in line with the tragus of the ear. The third injection site was 1.5-3 cm forward between the first 2 injection sites. The fourth injection site was 1.5 cm posterior to the second injection site.  -Occipitalis muscle injection, 3 sites, bilaterally. The first injection was done one half way between the occipital protuberance and the tip of the mastoid process behind the ear. The second injection site was done lateral and  superior to the first, 1 fingerbreadth from the first injection. The third injection site was 1 fingerbreadth superiorly and medially from the first injection site.  -Cervical paraspinal muscle injection, 2 sites, bilateral knee first injection site was 1 cm from the midline of the cervical spine, 3 cm inferior to the lower border of the occipital protuberance. The second injection site was 1.5 cm superiorly and laterally to the  first injection site.  -Trapezius muscle injection was performed at 3 sites, bilaterally. The first injection site was in the upper trapezius muscle halfway between the inflection point of the neck, and the acromion. The second injection site was one half way between the acromion and the first injection site. The third injection was done between the first injection site and the inflection point of the neck.   Will return for repeat injection in 3 months.   A 200 unit sof Botox was used, 155 units were injected, the rest of the Botox was wasted. The patient tolerated the procedure well, there were no complications of the above procedure.  Butch Penny, MSN, NP-C 01/11/2023, 9:41 AM [facility] [address] [place], [place] [address] [phone]

## 2023-01-31 ENCOUNTER — Encounter: Payer: Self-pay | Admitting: Neurology

## 2023-02-01 ENCOUNTER — Encounter: Payer: Self-pay | Admitting: Neurology

## 2023-02-17 ENCOUNTER — Other Ambulatory Visit: Payer: Self-pay | Admitting: *Deleted

## 2023-02-22 ENCOUNTER — Other Ambulatory Visit: Payer: Self-pay | Admitting: *Deleted

## 2023-02-22 DIAGNOSIS — F411 Generalized anxiety disorder: Secondary | ICD-10-CM

## 2023-02-22 NOTE — Progress Notes (Signed)
 error

## 2023-02-22 NOTE — Telephone Encounter (Signed)
 I had a 15 minute conversation with patient. She still elects to have her next appt with Megan canceled. She says the Botox  only works for 2 weeks. She doesn't wish to continue. She will call us  in the future if needed for migraines. She believes stress/worry may be a factor. She also mentioned an intermittent burning pain in her shin that she will discuss with PCP if referral is needed in the future. She states she only takes Fioricet for a severe migraine which may be once per week. She only takes Excedrin as needed. She understands the risk of rebound headache if she is taking these types of medications more than 2-3 times per week. She updated her medication list. She only takes aspirin  81 mg/day and not 650 mg. She said her ativan  has been weaned down to 0.5 mg bid. Her pharmacist advised her not to take fioricet (if needed) for several hours once she takes the morning ativan  dose. The patient is going to see PCP again this Friday. She is trying to get rid of a UTI. She has urinary frequency and is worried the Botox  is causing a weak bladder. I suggested she get her UTI cleared up first since this urinary frequency started with UTI diagnosis. Also let her know Botox  wears off in 12 weeks. She thanked me for the call.

## 2023-02-22 NOTE — Addendum Note (Signed)
 Addended by: Bertram Savin on: 02/22/2023 10:48 AM   Modules accepted: Orders

## 2023-02-27 LAB — LAB REPORT - SCANNED
A1c: 6.5
EGFR: 68.7

## 2023-03-01 ENCOUNTER — Encounter: Payer: Self-pay | Admitting: Cardiology

## 2023-03-02 NOTE — Progress Notes (Signed)
Cardiology Office Note:    Date:  03/03/2023   ID:  Brandy Flowers, DOB 01/28/1941, MRN [id number]  PCP:  Brandy Mage, NP  Cardiologist:  Brandy Herrlich, MD    Referring MD: Brandy Mage, NP    ASSESSMENT:    1. Renal artery stenosis (HCC)   2. Essential hypertension   3. Hyperlipidemia, unspecified hyperlipidemia type   4. Statin not tolerated    PLAN:    In order of problems listed above:  Fortunately renal artery stenosis is not severe enough flow-limiting continue her medical therapy Always difficult to judge blood pressure control with sporadic office readings she will go ahead and purchase a validated blood pressure device good technique and to check blood pressure for several weeks and give me a list currently she is taking atenolol losartan and if her blood pressure is not at target I would switch her to carvedilol which has a much better blood pressure profile effect goal systolic less than 140 Continue nonstatin lipid-lowering therapy very effective and well-tolerated in her case   Next appointment: 1 year follow-up she will need a renal artery vascular duplex about that time   Medication Adjustments/Labs and Tests Ordered: Current medicines are reviewed at length with the patient today.  Concerns regarding medicines are outlined above.  Orders Placed This Encounter  Procedures   EKG 12-Lead   No orders of the defined types were placed in this encounter.    History of Present Illness:    Brandy Flowers is a 83 y.o. female with a hx of nonflow limiting renal artery stenosis with hypertension and hyperlipidemia with statin intolerance last seen 09/01/2022.  Following the visit she had renal vascular duplex reported 09/29/2022 with mild 1 to 59% stenosis left renal artery not hemodynamically significant.  Compliance with diet, lifestyle and medications: Yes  Is very enthusiastic tells me lipid profile yesterday had a total cholesterol in the range of 130 and a  LDL less than 60 Has no side effects from Repatha Unfortunately is not checking her home blood pressure and she gets great variation Presently sick taking an antibiotic for urinary tract infection will finish today and she is having vague GI upset She is not having angina edema shortness of breath palpitation or syncope  Her friend is present I asked the 2 of them to get a validated blood pressure device Omron educated about technique to check blood pressure midday and to give me a list in several weeks to see if she is at target. Past Medical History:  Diagnosis Date   Anxiety    Anxiety state 12/17/2017   Arthritis    Cerebrovascular accident (CVA) (HCC) 07/20/2021   Chronic daily headache 04/06/2021   Chronic intractable headache 04/09/2021   COPD with exacerbation (HCC)    Diabetes mellitus without complication (HCC)    no meds, diet controlled - type 2   Diastolic dysfunction    Grade 1   Dysphagia, post-stroke    Essential (primary) hypertension 07/20/2021   Hyperlipidemia    Hypertension    Hypokalemia    Hyponatremia    Hypothyroid 09/20/2013   Hypothyroidism    Labile blood pressure    Left renal artery stenosis (HCC) 12/05/2014   Lipid disorder 07/20/2021   Migraine without aura and without status migrainosus, not intractable 01/12/2021   Migraines    New onset headache 04/06/2021   Oropharyngeal dysphagia    Renal artery stenosis (HCC)    Left 50%   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   ASPIRIN 81  PO Take 81 mg by mouth in the morning.   atenolol (TENORMIN) 50 MG tablet Take 50 mg by mouth in the morning and at bedtime.   cephALEXin (KEFLEX) 250 MG/5ML suspension Take 500 mg by mouth 3 (three) times daily.   Continuous Blood Gluc Receiver (FREESTYLE LIBRE 2 READER) DEVI    Continuous Blood Gluc Sensor (FREESTYLE LIBRE 2 SENSOR) MISC    diclofenac (FLECTOR) 1.3 % PTCH Place 1 patch onto the skin 2 (two) times daily as needed (pain).   Evolocumab (REPATHA SURECLICK) 140 MG/ML SOAJ INJECT 140 MG INTO THE SKIN EVERY 14 (FOURTEEN) DAYS.   lidocaine (LIDODERM) 5 % 1 patch daily as needed (pain).   LORazepam (ATIVAN) 0.5 MG tablet Take 1 tablet (0.5 mg total) by mouth 3 (three) times daily.   losartan (COZAAR) 50 MG tablet Take 50 mg by mouth 2 (two) times daily.   meclizine (ANTIVERT) 25 MG tablet Take 25 mg by mouth 2 (two) times daily.   ondansetron (ZOFRAN-ODT) 4 MG disintegrating tablet Take 4 mg by mouth every 8 (eight) hours as needed.   SYNTHROID 50 MCG tablet Take 125 mcg by mouth daily before breakfast. Must take (2) 50 mg can not take blue pills      EKGs/Labs/Other Studies Reviewed:    The following studies were reviewed today:  Cardiac Studies & Procedures      ECHOCARDIOGRAM  ECHOCARDIOGRAM COMPLETE 12/18/2017  Narrative *[place]* *[facility]* [address] [place], [place] [address] [phone]  ------------------------------------------------------------------- Transthoracic Echocardiography  Patient:    Brandy Flowers MR #:       [id number] Study Date: 12/18/2017 Gender:     F Age:        43 Height:     157.5 cm Weight:     63.5 kg BSA:        1.68 m^2 Pt. Status: Room:       [id number]  Brandy Flowers [id number] VHQIONGE     [name], [name] [id number] [name]    [name], [name] [id number] [name]    [name], [name] [id number] PERFORMING   [facility], Inpatient SONOGRAPHER  Belva Chimes  cc:  ------------------------------------------------------------------- LV EF: 65% -   70%  ------------------------------------------------------------------- Indications:      CVA 436.  ------------------------------------------------------------------- Study Conclusions  - Left ventricle: The cavity size was normal. Wall thickness was normal. Systolic function was vigorous. The estimated ejection fraction was in the range of 65% to 70%. Wall motion was normal; there were no regional wall motion abnormalities. Doppler parameters are consistent with abnormal left ventricular relaxation (grade 1 diastolic dysfunction). The E/e&' ratio is between 8-15, suggesting indeterminate LV filling pressure. - Aortic valve: Trileaflet. Sclerosis without stenosis. There was mild regurgitation. - Mitral valve: Calcified annulus. Mildly thickened leaflets . There was trivial regurgitation. - Left atrium: The atrium was normal in size. - Tricuspid valve: There was mild regurgitation. - Pulmonary arteries: PA peak pressure: 32 mm  Hg (S). - Inferior vena cava: The vessel was normal in size. The respirophasic diameter changes were in the normal range (>= 50%), consistent with normal central venous pressure.  Impressions:  - LVEF 65-70%, normal wall thickness, normal wall motion, grade 1 DD, indeterminate LV filling pressure, aortic valve sclerosis with mild AI, MAC with trivial MR, normal LA size, mild TR, RVSP 32 mmHg, normal IVC.  ------------------------------------------------------------------- Study data:  No prior study was available for comparison.  Study status:  Routine.  Procedure:  Transthoracic echocardiography. Image quality was adequate.  Study completion:  There were no complications.          Transthoracic echocardiography.  M-mode, complete 2D, spectral Doppler, and color Doppler.  Birthdate: Patient birthdate: [birth year].  Age:  Patient is 83 yr old.   Sex: Gender: female.    BMI: 25.6 kg/m^2.  Blood pressure:     173/62 Patient status:  Inpatient.  Study date:  Study date: 12/18/2017. Study time: 02:51 PM.  Location:  Bedside.  -------------------------------------------------------------------  ------------------------------------------------------------------- Left ventricle:  The cavity size was normal. Wall thickness was normal. Systolic function was vigorous. The estimated ejection fraction was in the range of 65% to 70%. Wall motion was normal; there were no regional wall motion abnormalities. Doppler parameters are consistent with abnormal left ventricular relaxation (grade 1 diastolic dysfunction). The E/e&' ratio is between 8-15, suggesting indeterminate LV filling pressure.  ------------------------------------------------------------------- Aortic valve:   Trileaflet. Sclerosis without stenosis.  Doppler: There was mild regurgitation.    VTI ratio of LVOT to aortic valve: 0.68. Valve area (VTI): 1.94 cm^2. Indexed valve area (VTI): 1.15 cm^2/m^2. Mean velocity ratio of LVOT to aortic valve: 0.73. Valve area (Vmean): 2.07 cm^2. Indexed valve area (Vmean): 1.23 cm^2/m^2. Mean gradient (S): 4 mm Hg.  ------------------------------------------------------------------- Mitral valve:   Calcified annulus. Mildly thickened leaflets . Doppler:  There was trivial regurgitation.  ------------------------------------------------------------------- Left atrium:  The atrium was normal in size.  ------------------------------------------------------------------- Atrial septum:  No defect or patent foramen ovale was identified.  ------------------------------------------------------------------- Right ventricle:  The cavity size was normal. Wall thickness was normal. Systolic function was normal.  ------------------------------------------------------------------- Pulmonic valve:    The valve appears to be grossly  normal. Doppler:  There was trivial regurgitation.  ------------------------------------------------------------------- Tricuspid valve:   Doppler:  There was mild regurgitation.  ------------------------------------------------------------------- Pulmonary artery:   The main pulmonary artery was normal-sized.  ------------------------------------------------------------------- Right atrium:  The atrium was normal in size.  ------------------------------------------------------------------- Pericardium:  There was no pericardial effusion.  ------------------------------------------------------------------- Systemic veins: Inferior vena cava: The vessel was normal in size. The respirophasic diameter changes were in the normal range (>= 50%), consistent with normal central venous pressure.  ------------------------------------------------------------------- Measurements  Left ventricle                            Value          Reference LV ID, ED, PLAX chordal           (L)     37.6  mm       43 - 52 LV ID, ES, PLAX chordal           (L)     20.6  mm       23 - 38 LV fx shortening, PLAX chordal            45    %        >=29 LV PW thickness, ED  6.01  mm       --------- IVS/LV PW ratio, ED               (H)     1.8            <=1.3 Stroke volume, 2D                         55    ml       --------- Stroke volume/bsa, 2D                     33    ml/m^2   --------- LV end-diastolic volume, 1-p A4C          93    ml       --------- LV ejection fraction, 1-p A4C             72    %        --------- LV end-diastolic volume/bsa, 1-p          55    ml/m^2   --------- A4C LV end-diastolic volume, 2-p              68    ml       --------- LV end-systolic volume, 2-p               22    ml       --------- LV ejection fraction, 2-p                 68    %        --------- Stroke volume, 2-p                        47    ml       --------- LV end-diastolic volume/bsa,  2-p          40    ml/m^2   --------- LV end-systolic volume/bsa, 2-p           13    ml/m^2   --------- Stroke volume/bsa, 2-p                    27.7  ml/m^2   --------- LV e&', lateral                            5.66  cm/s     --------- LV E/e&', lateral                          11.75          --------- LV s&', lateral                            6.2   cm/s     --------- LV e&', medial                             4.68  cm/s     --------- LV E/e&', medial                           14.21          --------- LV e&', average  5.17  cm/s     --------- LV E/e&', average                          12.86          ---------  Ventricular septum                        Value          Reference IVS thickness, ED                         10.8  mm       ---------  LVOT                                      Value          Reference LVOT ID, S                                19    mm       --------- LVOT area                                 2.84  cm^2     --------- LVOT mean velocity, S                     62.9  cm/s     --------- LVOT VTI, S                               19.4  cm       ---------  Aortic valve                              Value          Reference Aortic valve mean velocity, S             86.1  cm/s     --------- Aortic valve VTI, S                       28.4  cm       --------- Aortic mean gradient, S                   4     mm Hg    --------- VTI ratio, LVOT/AV                        0.68           --------- Aortic valve area, VTI                    1.94  cm^2     --------- Aortic valve area/bsa, VTI                1.15  cm^2/m^2 --------- Velocity ratio, mean, LVOT/AV             0.73           --------- Aortic valve area, mean velocity  2.07  cm^2     --------- Aortic valve area/bsa, mean               1.23  cm^2/m^2 --------- velocity  Aorta                                     Value          Reference Aortic root ID, ED                        32     mm       --------- Ascending aorta ID, A-P, S                30    mm       ---------  Left atrium                               Value          Reference LA ID, A-P, ES                            26    mm       --------- LA ID/bsa, A-P                            1.55  cm/m^2   <=2.2 LA volume, S                              44.5  ml       --------- LA volume/bsa, S                          26.5  ml/m^2   --------- LA volume, ES, 1-p A4C                    47.2  ml       --------- LA volume/bsa, ES, 1-p A4C                28.1  ml/m^2   --------- LA volume, ES, 1-p A2C                    43.2  ml       --------- LA volume/bsa, ES, 1-p A2C                25.7  ml/m^2   ---------  Mitral valve                              Value          Reference Mitral E-wave peak velocity               66.5  cm/s     --------- Mitral A-wave peak velocity               79.1  cm/s     --------- Mitral deceleration time          (H)     282   ms       150 - 230 Mitral E/A ratio, peak  0.8            ---------  Pulmonary arteries                        Value          Reference PA pressure, S, DP                (H)     32    mm Hg    <=30  Tricuspid valve                           Value          Reference Tricuspid regurg peak velocity            244   cm/s     --------- Tricuspid peak RV-RA gradient             24    mm Hg    ---------  Systemic veins                            Value          Reference Estimated CVP                             8     mm Hg    ---------  Right ventricle                           Value          Reference RV ID, minor axis, ED, A4C base           36.2  mm       --------- RV ID, minor axis, ED, A4C mid            25.9  mm       --------- RV ID, major axis, ED, A4C                60.9  mm       55 - 91 TAPSE                                     23.6  mm       --------- RV pressure, S, DP                (H)     32    mm Hg    <=30 RV s&', lateral, S                          17.6  cm/s     ---------  Pulmonic valve                            Value          Reference Pulmonic valve peak velocity, S           109   cm/s     ---------  Legend: (L)  and  (H)  mark values outside specified reference range.  ------------------------------------------------------------------- Prepared and Electronically Authenticated by  Zoila Shutter MD 2019-11-03T16:20:35  EKG Interpretation Date/Time:  Thursday March 03 2023 10:28:08 EST Ventricular Rate:  71 PR Interval:  170 QRS Duration:  80 QT Interval:  404 QTC Calculation: 439 R Axis:   0  Text Interpretation: Normal sinus rhythm Normal ECG When compared with ECG of 14-May-2021 08:06, Criteria for Septal infarct are no longer Present Confirmed by Brandy Flowers ([id number]) on 03/03/2023 10:35:33 AM   Recent Labs: No results found for requested labs within last 365 days.  Recent Lipid Panel    Component Value Date/Time   CHOL 194 09/01/2022 1401   TRIG 151 (H) 09/01/2022 1401   HDL 54 09/01/2022 1401   CHOLHDL 3.6 09/01/2022 1401   CHOLHDL 5.0 12/18/2017 0353   VLDL 25 12/18/2017 0353   LDLCALC 113 (H) 09/01/2022 1401    Physical Exam:    VS:  BP (!) 158/60   Pulse 71   Ht 5\' 2"  (1.575 m)   Wt 101 lb 3.2 oz (45.9 kg)   SpO2 98%   BMI 18.51 kg/m     Wt Readings from Last 3 Encounters:  03/03/23 101 lb 3.2 oz (45.9 kg)  09/01/22 101 lb 9.6 oz (46.1 kg)  06/08/22 104 lb (47.2 kg)     GEN: Quite thin  in no acute distress HEENT: Normal NECK: No JVD; No carotid bruits LYMPHATICS: No lymphadenopathy CARDIAC: RRR, no murmurs, rubs, gallops RESPIRATORY:  Clear to auscultation without rales, wheezing or rhonchi  ABDOMEN: Soft, non-tender, non-distended MUSCULOSKELETAL:  No edema; No deformity  SKIN: Warm and dry NEUROLOGIC:  Alert and oriented x 3 PSYCHIATRIC:  Normal affect    Signed, Brandy Herrlich, MD  03/03/2023 10:36 AM    [facility]

## 2023-03-03 ENCOUNTER — Ambulatory Visit: Payer: Medicare Other | Attending: Cardiology | Admitting: Cardiology

## 2023-03-03 ENCOUNTER — Other Ambulatory Visit: Payer: Self-pay

## 2023-03-03 ENCOUNTER — Encounter: Payer: Self-pay | Admitting: Cardiology

## 2023-03-03 VITALS — BP 158/60 | HR 71 | Ht 62.0 in | Wt 101.2 lb

## 2023-03-03 DIAGNOSIS — E785 Hyperlipidemia, unspecified: Secondary | ICD-10-CM | POA: Insufficient documentation

## 2023-03-03 DIAGNOSIS — I701 Atherosclerosis of renal artery: Secondary | ICD-10-CM | POA: Diagnosis not present

## 2023-03-03 DIAGNOSIS — Z789 Other specified health status: Secondary | ICD-10-CM | POA: Diagnosis present

## 2023-03-03 DIAGNOSIS — I10 Essential (primary) hypertension: Secondary | ICD-10-CM | POA: Insufficient documentation

## 2023-03-03 MED ORDER — REPATHA SURECLICK 140 MG/ML ~~LOC~~ SOAJ: 140.0000 mg | SUBCUTANEOUS | 10 refills | Status: DC

## 2023-03-03 NOTE — Patient Instructions (Addendum)
Medication Instructions:  Your physician recommends that you continue on your current medications as directed. Please refer to the Current Medication list given to you today.  *If you need a refill on your cardiac medications before your next appointment, please call your pharmacy*   Lab Work: None If you have labs (blood work) drawn today and your tests are completely normal, you will receive your results only by: MyChart Message (if you have MyChart) OR A paper copy in the mail If you have any lab test that is abnormal or we need to change your treatment, we will call you to review the results.   Testing/Procedures: None   Follow-Up: At [facility], you and your health needs are our priority.  As part of our continuing mission to provide you with exceptional heart care, we have created designated Provider Care Teams.  These Care Teams include your primary Cardiologist (physician) and Advanced Practice Providers (APPs -  Physician Assistants and Nurse Practitioners) who all work together to provide you with the care you need, when you need it.  We recommend signing up for the patient portal called "MyChart".  Sign up information is provided on this After Visit Summary.  MyChart is used to connect with patients for Virtual Visits (Telemedicine).  Patients are able to view lab/test results, encounter notes, upcoming appointments, etc.  Non-urgent messages can be sent to your provider as well.   To learn more about what you can do with MyChart, go to [email or web address].    Your next appointment:   1 year(s)  Provider:   Norman Herrlich, MD    Other Instructions  Purchase a new Omron blood pressure device.  Check blood pressure daily and after 2 weeks bring a list of blood pressures to the office in 2 weeks.            [employer]  Tips to measure your blood pressure correctly  To determine whether you have hypertension, a medical professional will take a blood  pressure reading. How you prepare for the test, the position of your arm, and other factors can change a blood pressure reading by 10% or more. That could be enough to hide high blood pressure, start you on a drug you don't really need, or lead your doctor to incorrectly adjust your medications. National and international guidelines offer specific instructions for measuring blood pressure. If a doctor, nurse, or medical assistant isn't doing it right, don't hesitate to ask him or her to get with the guidelines. Here's what you can do to ensure a correct reading:  Don't drink a caffeinated beverage or smoke during the 30 minutes before the test.  Sit quietly for five minutes before the test begins.  During the measurement, sit in a chair with your feet on the floor and your arm supported so your elbow is at about heart level.  The inflatable part of the cuff should completely cover at least 80% of your upper arm, and the cuff should be placed on bare skin, not over a shirt.  Don't talk during the measurement.  Have your blood pressure measured twice, with a brief break in between. If the readings are different by 5 points or more, have it done a third time. There are times to break these rules. If you sometimes feel lightheaded when getting out of bed in the morning or when you stand after sitting, you should have your blood pressure checked while seated and then while standing to see if it falls  from one position to the next. Because blood pressure varies throughout the day, your doctor will rarely diagnose hypertension on the basis of a single reading. Instead, he or she will want to confirm the measurements on at least two occasions, usually within a few weeks of one another. The exception to this rule is if you have a blood pressure reading of 180/110 mm Hg or higher. A result this high usually calls for prompt treatment. It's also a good idea to have your blood pressure measured in both arms at least  once, since the reading in one arm (usually the right) may be higher than that in the left. A 2014 study in The American Journal of Medicine of nearly 3,400 people found average arm- to-arm differences in systolic blood pressure of about 5 points. The higher number should be used to make treatment decisions. In 2017, new guidelines from the American Heart Association, the [employer] of Cardiology, and nine other health organizations lowered the diagnosis of high blood pressure to 130/80 mm Hg or higher for all adults. The guidelines also redefined the various blood pressure categories to now include normal, elevated, Stage 1 hypertension, Stage 2 hypertension, and hypertensive crisis (see "Blood pressure categories"). Blood pressure categories  Blood pressure category SYSTOLIC (upper number)  DIASTOLIC (lower number)  Normal Less than 120 mm Hg and Less than 80 mm Hg  Elevated 120-129 mm Hg and Less than 80 mm Hg  High blood pressure: Stage 1 hypertension 130-139 mm Hg or 80-89 mm Hg  High blood pressure: Stage 2 hypertension 140 mm Hg or higher or 90 mm Hg or higher  Hypertensive crisis (consult your doctor immediately) Higher than 180 mm Hg and/or Higher than 120 mm Hg  Source: American Heart Association and American Stroke Association. For more on getting your blood pressure under control, buy Controlling Your Blood Pressure, a Special Health Report from [facility].

## 2023-03-23 ENCOUNTER — Encounter: Payer: Self-pay | Admitting: Neurology

## 2023-03-24 ENCOUNTER — Encounter: Payer: Self-pay | Admitting: Adult Health

## 2023-03-24 NOTE — Telephone Encounter (Signed)
 Messaged pt back in other encounter.

## 2023-04-05 ENCOUNTER — Ambulatory Visit: Payer: Medicare Other | Admitting: Adult Health

## 2023-04-08 ENCOUNTER — Ambulatory Visit: Payer: Medicare Other | Admitting: Behavioral Health

## 2023-04-08 ENCOUNTER — Encounter: Payer: Self-pay | Admitting: Behavioral Health

## 2023-04-08 DIAGNOSIS — F411 Generalized anxiety disorder: Secondary | ICD-10-CM | POA: Diagnosis not present

## 2023-04-08 MED ORDER — LORAZEPAM 0.5 MG PO TABS
0.5000 mg | ORAL_TABLET | Freq: Three times a day (TID) | ORAL | 2 refills | Status: AC
Start: 2023-04-08 — End: ?

## 2023-04-08 NOTE — Progress Notes (Signed)
[facility]  Patient ID: Brandy Flowers,  MRN: [id number]  PCP: Lars Mage, NP  Date of Evaluation: 04/08/2023 Time spent:30 minutes  Chief Complaint:  Chief Complaint   Anxiety; Depression; Follow-up; Medication Refill; Patient Education     HISTORY/CURRENT STATUS: HPI  "Brandy Flowers", 83 year old female presents to this office for initial visit and to establish care.  No changes this visit. No complications from reducing benzodiazapine use. A friend of the family is present with her consents. Friend provides transportation and assist with medical appointments.  Following up soon with PCP for migraines and pain behind the ear.  Says she will also be following up with ENT. Says her depression is 0/10 but anxiety is 3/10. Says she does sleep 7 hours per night. She denies mania or psychosis. No delirium, no auditory or visual hallucinations reported. No SI/HI.    Prior psychiatric medications: Klonopin Ativan Clonidine-Headache, flu like symptoms Lexapro-brain fog      Individual Medical History/ Review of Systems: Changes? :No   Allergies: Amiloride, Blue dyes (parenteral), Inderal la [propranolol hcl], Other, Sulfa antibiotics, Ace inhibitors, Norvasc [amlodipine], Calcium channel blockers, Catapres [clonidine hcl], Clonidine derivatives, Diovan [valsartan], Hydralazine, Metformin and related, Micardis [telmisartan], Minoxidil, Olmesartan, Plavix [clopidogrel], Serotonin reuptake inhibitors (ssris), Sulfur, Topamax [topiramate], Tramadol, Tylenol with codeine #3 [acetaminophen-codeine], Zetia [ezetimibe], Amoxicillin, Chlorthalidone, Doxycycline, Fosamax [alendronate sodium], Lexapro [escitalopram], Spironolactone, and Statins  Current Medications:  Current Outpatient Medications:    ACETAMINOPHEN-BUTALBITAL 50-325 MG TABS, Take 1 tablet by mouth 3 (three) times daily as needed., Disp: , Rfl:    ASPIRIN 81 PO, Take 81 mg by mouth in the morning., Disp: , Rfl:     atenolol (TENORMIN) 50 MG tablet, Take 50 mg by mouth in the morning and at bedtime., Disp: , Rfl:    cephALEXin (KEFLEX) 250 MG/5ML suspension, Take 500 mg by mouth 3 (three) times daily., Disp: , Rfl:    Continuous Blood Gluc Receiver (FREESTYLE LIBRE 2 READER) DEVI, , Disp: , Rfl:    Continuous Blood Gluc Sensor (FREESTYLE LIBRE 2 SENSOR) MISC, , Disp: , Rfl:    diclofenac (FLECTOR) 1.3 % PTCH, Place 1 patch onto the skin 2 (two) times daily as needed (pain)., Disp: , Rfl:    Evolocumab (REPATHA SURECLICK) 140 MG/ML SOAJ, Inject 140 mg into the skin every 14 (fourteen) days., Disp: 2 mL, Rfl: 10   lidocaine (LIDODERM) 5 %, 1 patch daily as needed (pain)., Disp: , Rfl:    LORazepam (ATIVAN) 0.5 MG tablet, Take 1 tablet (0.5 mg total) by mouth 3 (three) times daily., Disp: 180 tablet, Rfl: 2   losartan (COZAAR) 50 MG tablet, Take 50 mg by mouth 2 (two) times daily., Disp: , Rfl:    meclizine (ANTIVERT) 25 MG tablet, Take 25 mg by mouth 2 (two) times daily., Disp: , Rfl:    ondansetron (ZOFRAN-ODT) 4 MG disintegrating tablet, Take 4 mg by mouth every 8 (eight) hours as needed., Disp: , Rfl:    SYNTHROID 50 MCG tablet, Take 125 mcg by mouth daily before breakfast. Must take (2) 50 mg can not take blue pills, Disp: , Rfl:  Medication Side Effects: none  Family Medical/ Social History: Changes? No  MENTAL HEALTH EXAM:  There were no vitals taken for this visit.There is no height or weight on file to calculate BMI.  General Appearance: Casual, Neat, and Well Groomed  Eye Contact:  NA  Speech:  Clear and Coherent  Volume:  Normal  Mood:  NA  Affect:  Appropriate  Thought Process:  Coherent  Orientation:  Full (Time, Place, and Person)  Thought Content: Logical   Suicidal Thoughts:  No  Homicidal Thoughts:  No  Memory:  WNL  Judgement:  Good  Insight:  Good  Psychomotor Activity:  Normal  Concentration:  Concentration: Good  Recall:  Good  Fund of Knowledge: Good  Language: Good   Assets:  Desire for Improvement  ADL's:  Intact  Cognition: WNL  Prognosis:  Good    DIAGNOSES:    ICD-10-CM   1. Generalized anxiety disorder  F41.1       Receiving Psychotherapy: No    RECOMMENDATIONS:    Greater than 50% of 30 min face to face time with patient was spent on counseling and coordination of care. No social changes. Appears a little weaker today. She is still cognitively sharp. Ambulating with walker. We discuss her continue concerns over her medical health issues. She continues to follow up with PCP regularly   I reinforced  counseled her again today about being very careful when using benzo and not taking same time with pain medication. We discussed weaning down some on her benzo and she wanted to reassess next visit. She reports medication is only thing that prevents her from having panic.   We agreed today: To continue  Ativan to 0.5 mg thee times daily as needed. Will try to reduce to 0.25 three times daily if possible next visit.  Discussed potential benefits, risks, and side effects of  To follow up in 6 months to reassess. Provided emergency contact information Reviewed PDMP   Joan Flores, NP

## 2023-04-11 ENCOUNTER — Ambulatory Visit: Payer: Medicare Other | Admitting: Adult Health

## 2023-04-14 ENCOUNTER — Ambulatory Visit (INDEPENDENT_AMBULATORY_CARE_PROVIDER_SITE_OTHER): Payer: Medicare Other | Admitting: Podiatry

## 2023-04-14 DIAGNOSIS — M79674 Pain in right toe(s): Secondary | ICD-10-CM | POA: Diagnosis not present

## 2023-04-14 DIAGNOSIS — B351 Tinea unguium: Secondary | ICD-10-CM | POA: Diagnosis not present

## 2023-04-14 DIAGNOSIS — M79675 Pain in left toe(s): Secondary | ICD-10-CM

## 2023-04-14 NOTE — Progress Notes (Signed)
 Subjective:  Patient ID: Brandy Flowers, female    DOB: Aug 28, 1940,  MRN: [id number]   Brandy Flowers presents to clinic today for:  Chief Complaint  Patient presents with   [facility]    [facility]  no callous. Last A1c 3 weeks ago was 6.1. Takes ASA 81   Patient notes nails are thick, discolored, elongated and painful in shoegear when trying to ambulate.  She also has a pinch callus on the left fourth toe.  PCP is [name], Brandy Downs, NP.  Past Medical History:  Diagnosis Date   Anxiety    Anxiety state 12/17/2017   Arthritis    Cerebrovascular accident (CVA) (HCC) 07/20/2021   Chronic daily headache 04/06/2021   Chronic intractable headache 04/09/2021   COPD with exacerbation (HCC)    Diabetes mellitus without complication (HCC)    no meds, diet controlled - type 2   Diastolic dysfunction    Grade 1   Dysphagia, post-stroke    Essential (primary) hypertension 07/20/2021   Hyperlipidemia    Hypertension    Hypokalemia    Hyponatremia    Hypothyroid 09/20/2013   Hypothyroidism    Labile blood pressure    Left renal artery stenosis (HCC) 12/05/2014   Lipid disorder 07/20/2021   Migraine without aura and without status migrainosus, not intractable 01/12/2021   Migraines    New onset headache 04/06/2021   Oropharyngeal dysphagia    Renal artery stenosis (HCC)    Left 50%   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   Sigmoid diverticulosis    Sinus problem    Splenic artery aneurysm (HCC)    7mm   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   Statin not tolerated 10/08/2019   Stroke (cerebrum) (HCC) 12/17/2017   Stroke (HCC) 12/16/2017   Thyroid disease    Type 2 diabetes, controlled, 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    Past Surgical History:  Procedure Laterality Date   BREAST REDUCTION SURGERY  2007   CATARACT EXTRACTION Bilateral    CHOLECYSTECTOMY     COLONOSCOPY     RADIOLOGY WITH ANESTHESIA N/A 05/14/2021   Procedure: MRI BRAIN WITH AND WITHOUT CONTRAST WITH  ANESTHESIA;  Surgeon: Radiologist, Medication, MD;  Location: [facility] OR;  Service: Radiology;  Laterality: N/A;   RADIOLOGY WITH ANESTHESIA N/A 05/14/2021   Procedure: MRV WITHOUT CONTRAST WITH ANESTHESIA;  Surgeon: Radiologist, Medication, MD;  Location: [facility] OR;  Service: Radiology;  Laterality: N/A;   TONSILLECTOMY AND ADENOIDECTOMY     TOOTH EXTRACTION  2019   with anesthesia   WISDOM TOOTH EXTRACTION      Allergies  Allergen Reactions   Amiloride Shortness Of Breath   Blue Dyes (Parenteral) Shortness Of Breath, Rash and Other (See Comments)    Joint pain   Inderal La [Propranolol Hcl] Shortness Of Breath   Other Other (See Comments), Rash and Shortness Of Breath    Joint pain   Sulfa Antibiotics Anaphylaxis   Ace Inhibitors Nausea And Vomiting   Norvasc [Amlodipine] Nausea And Vomiting    Taking at this time. No problems   Calcium Channel Blockers Diarrhea   Catapres [Clonidine Hcl]     Decreased mental clarity    Clonidine Derivatives Other (See Comments)    Headache, flu like symptoms   Diovan [Valsartan] Cough   Hydralazine Other (See Comments)    Headache   Metformin And Related Diarrhea   Micardis [Telmisartan] Other (See Comments)    Hallucinations, increase in BP   Minoxidil Other (See Comments)  Fast heart rate, chest pain   Olmesartan Swelling    "Made me very ill to include lip swelling"    Plavix [Clopidogrel]     Increased LFT's    Serotonin Reuptake Inhibitors (Ssris) Other (See Comments)    Headache   Sulfur    Topamax [Topiramate] Other (See Comments)    Confusion   Tramadol     Nausea as a dog per pt   Tylenol With Codeine #3 [Acetaminophen-Codeine] Other (See Comments)    Lightheaded   Zetia [Ezetimibe]     Liver  Function increase labs   Amoxicillin Diarrhea   Chlorthalidone     Dizziness.    Doxycycline Diarrhea   Fosamax [Alendronate Sodium]     Pt's teeth fell out while taking.    Lexapro [Escitalopram]     Brain Fog    Spironolactone  Palpitations   Statins     Upset stomach     Review of Systems: Negative except as noted in the HPI.  Objective:  Brandy Flowers is a pleasant 83 y.o. female in NAD. AAO x 3.  Vascular Examination: Capillary refill time is 3-5 seconds to toes bilateral. Palpable pedal pulses b/l LE. Digital hair present b/l.  Skin temperature gradient WNL b/l. No varicosities b/l. No cyanosis noted b/l.   Dermatological Examination: Pedal skin with normal turgor, texture and tone b/l. No open wounds. No interdigital macerations b/l. Toenails x10 are 3mm thick, discolored, dystrophic with subungual debris. There is pain with compression of the nail plates.  They are elongated x10.  There is a longitudinal hyperkeratotic lesion on the plantar medial aspect the left fourth toe near the toe pulp where it is starting to underlapped the third toe and getting pinched and dry.  Assessment/Plan: 1. Pain due to onychomycosis of toenails of both feet    The mycotic toenails were sharply debrided x10 with sterile nail nippers and a power debriding burr to decrease bulk/thickness and length.  The hard skin was sanded to smooth out any rough edges.  Return in about 3 months (around 07/12/2023) for [facility].   Clerance Lav, DPM, FACFAS [facility]     [address] [place], [place] [address]                Office [phone]  Fax [phone]

## 2023-04-21 ENCOUNTER — Encounter: Payer: Self-pay | Admitting: Neurology

## 2023-05-01 ENCOUNTER — Encounter: Payer: Self-pay | Admitting: Neurology

## 2023-05-06 ENCOUNTER — Telehealth (INDEPENDENT_AMBULATORY_CARE_PROVIDER_SITE_OTHER): Payer: Self-pay | Admitting: Otolaryngology

## 2023-05-06 NOTE — Telephone Encounter (Signed)
 Confirmed appt & location [id number] afm

## 2023-05-09 ENCOUNTER — Encounter (INDEPENDENT_AMBULATORY_CARE_PROVIDER_SITE_OTHER): Payer: Self-pay

## 2023-05-09 ENCOUNTER — Ambulatory Visit (INDEPENDENT_AMBULATORY_CARE_PROVIDER_SITE_OTHER): Payer: Medicare Other

## 2023-05-09 VITALS — BP 184/64 | HR 67 | Wt 100.0 lb

## 2023-05-09 DIAGNOSIS — H6121 Impacted cerumen, right ear: Secondary | ICD-10-CM | POA: Diagnosis not present

## 2023-05-09 DIAGNOSIS — R42 Dizziness and giddiness: Secondary | ICD-10-CM | POA: Diagnosis not present

## 2023-05-10 DIAGNOSIS — R42 Dizziness and giddiness: Secondary | ICD-10-CM | POA: Insufficient documentation

## 2023-05-10 DIAGNOSIS — H6121 Impacted cerumen, right ear: Secondary | ICD-10-CM | POA: Insufficient documentation

## 2023-05-10 NOTE — Progress Notes (Signed)
 Patient ID: Brandy Flowers, female   DOB: 12-Sep-1940, 83 y.o.   MRN: [id number]  CC: Recurrent dizziness  HPI:  Brandy Flowers is a 83 y.o. female who presents today for evaluation of her recurrent dizziness.  According to the patient, she has been symptomatic for the past 3 months.  She describes her dizziness as a lightheaded and off-balance sensation.  She denies any spinning vertigo.  She was treated with meclizine and Ativan.  The dizziness is often accompanied by headaches.  The patient has a history of migraine headaches.  She is currently being seen by Dr. Daisy Blossom.  She was treated with Botox injections and migraine medications.  She also has a history of hypertension.  She is on losartan and atenolol.  She denies any otalgia, otorrhea, or hearing difficulty.  She has no previous otologic surgery.  She underwent adenotonsillectomy previously.  Past Medical History:  Diagnosis Date   Anxiety    Anxiety state 12/17/2017   Arthritis    Cerebrovascular accident (CVA) (HCC) 07/20/2021   Chronic daily headache 04/06/2021   Chronic intractable headache 04/09/2021   COPD with exacerbation (HCC)    Diabetes mellitus without complication (HCC)    no meds, diet controlled - type 2   Diastolic dysfunction    Grade 1   Dysphagia, post-stroke    Essential (primary) hypertension 07/20/2021   Hyperlipidemia    Hypertension    Hypokalemia    Hyponatremia    Hypothyroid 09/20/2013   Hypothyroidism    Labile blood pressure    Left renal artery stenosis (HCC) 12/05/2014   Lipid disorder 07/20/2021   Migraine without aura and without status migrainosus, not intractable 01/12/2021   Migraines    New onset headache 04/06/2021   Oropharyngeal dysphagia    Renal artery stenosis (HCC)    Left 50%   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   Sigmoid diverticulosis    Sinus problem    Splenic artery aneurysm (HCC)    7mm   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   Statin not tolerated  10/08/2019   Stroke (cerebrum) (HCC) 12/17/2017   Stroke (HCC) 12/16/2017   Thyroid disease    Type 2 diabetes, controlled, 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    Past Surgical History:  Procedure Laterality Date   BREAST REDUCTION SURGERY  2007   CATARACT EXTRACTION Bilateral    CHOLECYSTECTOMY     COLONOSCOPY     RADIOLOGY WITH ANESTHESIA N/A 05/14/2021   Procedure: MRI BRAIN WITH AND WITHOUT CONTRAST WITH ANESTHESIA;  Surgeon: Radiologist, Medication, MD;  Location: [facility] OR;  Service: Radiology;  Laterality: N/A;   RADIOLOGY WITH ANESTHESIA N/A 05/14/2021   Procedure: MRV WITHOUT CONTRAST WITH ANESTHESIA;  Surgeon: Radiologist, Medication, MD;  Location: [facility] OR;  Service: Radiology;  Laterality: N/A;   TONSILLECTOMY AND ADENOIDECTOMY     TOOTH EXTRACTION  2019   with anesthesia   WISDOM TOOTH EXTRACTION      Family History  Problem Relation Age of Onset   Stroke Mother    Diabetes Mother    Heart disease Mother        Before age 46   Hypertension Mother    Hyperlipidemia Mother    Heart disease Father    Heart attack Father    Hypertension Father    Heart disease Brother        After age 82- CABG   Hypertension Brother    Migraines Neg Hx     Social History:  reports that she has never  smoked. She has never used smokeless tobacco. She reports that she does not drink alcohol and does not use drugs.  Allergies:  Allergies  Allergen Reactions   Amiloride Shortness Of Breath   Blue Dyes (Parenteral) Shortness Of Breath, Rash and Other (See Comments)    Joint pain   Inderal La [Propranolol Hcl] Shortness Of Breath   Other Other (See Comments), Rash and Shortness Of Breath    Joint pain   Sulfa Antibiotics Anaphylaxis   Ace Inhibitors Nausea And Vomiting   Norvasc [Amlodipine] Nausea And Vomiting    Taking at this time. No problems   Calcium Channel Blockers Diarrhea   Catapres [Clonidine Hcl]     Decreased mental clarity    Clonidine Derivatives Other (See Comments)     Headache, flu like symptoms   Diovan [Valsartan] Cough   Hydralazine Other (See Comments)    Headache   Metformin And Related Diarrhea   Micardis [Telmisartan] Other (See Comments)    Hallucinations, increase in BP   Minoxidil Other (See Comments)    Fast heart rate, chest pain   Olmesartan Swelling    "Made me very ill to include lip swelling"    Plavix [Clopidogrel]     Increased LFT's    Serotonin Reuptake Inhibitors (Ssris) Other (See Comments)    Headache   Sulfur    Topamax [Topiramate] Other (See Comments)    Confusion   Tramadol     Nausea as a dog per pt   Tylenol With Codeine #3 [Acetaminophen-Codeine] Other (See Comments)    Lightheaded   Zetia [Ezetimibe]     Liver  Function increase labs   Amoxicillin Diarrhea   Chlorthalidone     Dizziness.    Doxycycline Diarrhea   Fosamax [Alendronate Sodium]     Pt's teeth fell out while taking.    Lexapro [Escitalopram]     Brain Fog    Spironolactone Palpitations   Statins     Upset stomach     Prior to Admission medications   Medication Sig Start Date End Date Taking? Authorizing Provider  ACETAMINOPHEN-BUTALBITAL 50-325 MG TABS Take 1 tablet by mouth 3 (three) times daily as needed. 01/19/23  Yes [provider]  ASPIRIN 81 PO Take 81 mg by mouth in the morning.   Yes [provider]  atenolol (TENORMIN) 50 MG tablet Take 50 mg by mouth in the morning and at bedtime.   Yes [provider]  Continuous Blood Gluc Receiver (FREESTYLE LIBRE 2 READER) DEVI  01/29/20  Yes [provider]  Continuous Blood Gluc Sensor (FREESTYLE LIBRE 2 SENSOR) MISC  01/29/20  Yes [provider]  diclofenac (FLECTOR) 1.3 % PTCH Place 1 patch onto the skin 2 (two) times daily as needed (pain).   Yes [provider]  Evolocumab (REPATHA SURECLICK) 140 MG/ML SOAJ Inject 140 mg into the skin every 14 (fourteen) days. 03/03/23  Yes Baldo Daub, MD  lidocaine (LIDODERM) 5 % 1 patch daily  as needed (pain). 06/15/18  Yes [provider]  LORazepam (ATIVAN) 0.5 MG tablet Take 1 tablet (0.5 mg total) by mouth 3 (three) times daily. 04/08/23  Yes Avelina Laine A, NP  losartan (COZAAR) 50 MG tablet Take 50 mg by mouth 2 (two) times daily.   Yes [provider]  meclizine (ANTIVERT) 25 MG tablet Take 25 mg by mouth 2 (two) times daily. 10/01/22  Yes [provider]  ondansetron (ZOFRAN-ODT) 4 MG disintegrating tablet Take 4 mg by  mouth every 8 (eight) hours as needed. 01/24/23  Yes [provider]  SYNTHROID 50 MCG tablet Take 125 mcg by mouth daily before breakfast. Must take (2) 50 mg can not take blue pills 02/01/19  Yes [provider]    Blood pressure (!) 184/64, pulse 67, weight 100 lb (45.4 kg), SpO2 92%. Exam: General: Communicates without difficulty, well nourished, no acute distress. Head: Normocephalic, no evidence injury, no tenderness, facial buttresses intact without stepoff. Face/sinus: No tenderness to palpation and percussion. Facial movement is normal and symmetric. Eyes: PERRL, EOMI. No scleral icterus, conjunctivae clear. Neuro: CN II exam reveals vision grossly intact.  No nystagmus at any point of gaze. Ears: Auricles well formed without lesions.  Right ear cerumen impaction.  The left tympanic membrane and middle ear space are normal.  Nose: External evaluation reveals normal support and skin without lesions.  Dorsum is intact.  Anterior rhinoscopy reveals congested mucosa over anterior aspect of inferior turbinates and intact septum.  No purulence noted. Oral:  Oral cavity and oropharynx are intact, symmetric, without erythema or edema.  Mucosa is moist without lesions. Neck: Full range of motion without pain.  There is no significant lymphadenopathy.  No masses palpable.  Thyroid bed within normal limits to palpation.  Parotid glands and submandibular glands equal bilaterally without mass.  Trachea is midline. Neuro:  CN 2-12  grossly intact. Vestibular: No nystagmus at any point of gaze. Dix Hallpike negative. Vestibular: There is no nystagmus with pneumatic pressure on either tympanic membrane or Valsalva. The cerebellar examination is unremarkable.   Procedure: Right ear cerumen disimpaction Anesthesia: None Description: Under the operating microscope, the cerumen is carefully removed with a combination of cerumen currette, alligator forceps, and suction catheters.  After the cerumen is removed, the TMs are noted to be normal.  No mass, erythema, or lesions. The patient tolerated the procedure well.    Assessment: 1.  Incidental finding of right ear cerumen impaction.  After the cerumen removal procedure, both tympanic membranes and middle ear spaces are noted to be normal. 2.  Recurrent dizziness of unknown etiology.  Based on her history, vestibular migraine is high on the differential diagnosis list.  Other possibilities include transient BPPV, Meniere's disease, peripheral vestibular dysfunction, or other central/systemic causes.    Plan: 1.  Otomicroscopy with right ear cerumen removal. 2.  The physical exam findings are reviewed with the patient. 3.  The pathophysiology of vestibular dysfunction and dizziness are discussed extensively with the patient. The possible differential diagnoses are reviewed. Questions are invited and answered.   4.  The patient will continue with her migraine management regimen. 5.  The patient may benefit from undergoing physical therapy/vestibular rehabilitation to improve her balancing function. 6.  If the patient continues to be symptomatic, she may also benefit from vestibular neurodiagnostic testing at a tertiary care center to evaluate for possible vestibular dysfunction.   Kabria Hetzer W Mareesa Gathright 05/10/2023, 9:12 AM

## 2023-05-12 ENCOUNTER — Ambulatory Visit: Payer: Medicare Other | Admitting: Neurology

## 2023-05-12 ENCOUNTER — Telehealth: Payer: Self-pay | Admitting: Neurology

## 2023-05-12 DIAGNOSIS — G43709 Chronic migraine without aura, not intractable, without status migrainosus: Secondary | ICD-10-CM | POA: Diagnosis not present

## 2023-05-12 MED ORDER — ONABOTULINUMTOXINA 200 UNITS IJ SOLR
155.0000 [IU] | Freq: Once | INTRAMUSCULAR | Status: AC
  Administered 2023-05-12: 155 [IU] via INTRAMUSCULAR

## 2023-05-12 NOTE — Progress Notes (Signed)
 05/12/2023: She is still getting > 50% relief on botox.    01/11/23:  botox works for the first 1-2 months and she will start have breakthrough headaches.  She reports that this cycle her headaches were worse.  She states in the past she was on Emgality. She only took this for a couple of months because she was given samples.  She feels that the combination of Emgality and Botox worked well.  Her primary care gave her Fioricet and that works well to resolve her headaches typically.  I did caution her about rebound headaches.  Patient states that she did have a fall on Sunday.  She states that she did not hit her head.  Her brother who is an Charity fundraiser came to check on her.  She has not had any additional symptoms since the fall.  She does notice that she has a bruise today on the right hand.  She is not sure if that is from the fall or from something else.  Did advise that if the bruise worsen she should see her primary care provider.   09/30/22: Reports that botox has been helping. Down to about 2 headaches a week. Severity is better. Has had a daily headache for the last 2 weeks. Most likely d/t botox wearing off.   06/30/22: First botox since she received Sample from Dr. Lucia Gaskins    BOTOX PROCEDURE NOTE FOR MIGRAINE HEADACHE    Contraindications and precautions discussed with patient(above). Aseptic procedure was observed and patient tolerated procedure. Procedure performed by Butch Penny, NP  The condition has existed for more than 6 months, and pt does not have a diagnosis of ALS, Myasthenia Gravis or Lambert-Eaton Syndrome.  Risks and benefits of injections discussed and pt agrees to proceed with the procedure.  Written consent obtained  These injections are medically necessary.  These injections do not cause sedations or hallucinations which the oral therapies may cause.  Indication/Diagnosis: chronic migraine BOTOX(J0585) injection was performed according to protocol by [employer]. 200 units  of BOTOX was dissolved into 4 cc NS.   NDC: [address]-40  Type of toxin: Botox  Botox- 200 units x 1 vial Lot: [id number] Expiration: 04/2025 NDC: 7829-5621-30   Bacteriostatic 0.9% Sodium Chloride- 4mL total [id number] Expiratin: 05/17/23 NDC: 2952-8413-24   Dx: M01.027       Description of procedure:  The patient was placed in a sitting position. The standard protocol was used for Botox as follows, with 5 units of Botox injected at each site:   -Procerus muscle, midline injection  -Corrugator muscle, bilateral injection  -Frontalis muscle, bilateral injection, with 2 sites each side, medial injection was performed in the upper one third of the frontalis muscle, in the region vertical from the medial inferior edge of the superior orbital rim. The lateral injection was again in the upper one third of the forehead vertically above the lateral limbus of the cornea, 1.5 cm lateral to the medial injection site.  -Temporalis muscle injection, 4 sites, bilaterally. The first injection was 3 cm above the tragus of the ear, second injection site was 1.5 cm to 3 cm up from the first injection site in line with the tragus of the ear. The third injection site was 1.5-3 cm forward between the first 2 injection sites. The fourth injection site was 1.5 cm posterior to the second injection site.  -Occipitalis muscle injection, 3 sites, bilaterally. The first injection was done one half way between the occipital protuberance and the tip of  the mastoid process behind the ear. The second injection site was done lateral and superior to the first, 1 fingerbreadth from the first injection. The third injection site was 1 fingerbreadth superiorly and medially from the first injection site.  -Cervical paraspinal muscle injection, 2 sites, bilateral knee first injection site was 1 cm from the midline of the cervical spine, 3 cm inferior to the lower border of the occipital protuberance. The second injection  site was 1.5 cm superiorly and laterally to the first injection site.  -Trapezius muscle injection was performed at 3 sites, bilaterally. The first injection site was in the upper trapezius muscle halfway between the inflection point of the neck, and the acromion. The second injection site was one half way between the acromion and the first injection site. The third injection was done between the first injection site and the inflection point of the neck.   Will return for repeat injection in 3 months.   A 200 unit sof Botox was used, 155 units were injected, 45u of the Botox was wasted. The patient tolerated the procedure well, there were no complications of the above procedure.  Naomie Dean, md [facility] [address], [address] [place], [place] [address] [phone]

## 2023-05-12 NOTE — Progress Notes (Signed)
 Botox- 200 units x 1 vial Lot: [id number] Expiration: 05/2025 NDC: 1308-6578-46  Bacteriostatic 0.9% Sodium Chloride- * mL  Lot: [id number] Expiration: 11/1/205 NDC: 8413-2440-10  Dx: U72.536 B/B Witnessed by Hermenia Fiscal, RN

## 2023-05-12 NOTE — Telephone Encounter (Signed)
 I spoke with the patient. I provided reassurance that Dr Lucia Gaskins is still her doctor. I explained that the staff scheduled her with [facility] per Dr Trevor Mace instructions. Patient verbalized understanding and remains scheduled with Megan.

## 2023-05-12 NOTE — Telephone Encounter (Signed)
 Patient called in today, states she had Botox done with Dr. Lucia Gaskins this morning. She is scheduled for a follow up Botox with Tylene Fantasia. She would like to continue getting Botox from only Dr. Lucia Gaskins. States Medicare pays for her to get it done by and MD. I let her know that Dr. Lucia Gaskins is trying to create more room on her schedules to see new patients and have her stable patients see NP's as they can do Botox as well, but she wants to see Dr. Lucia Gaskins. I did not change appt she is still scheduled with Megan at this time, I let her know someone would give her a call back to discuss

## 2023-05-26 ENCOUNTER — Encounter: Payer: Self-pay | Admitting: Neurology

## 2023-05-30 ENCOUNTER — Encounter: Payer: Self-pay | Admitting: Neurology

## 2023-05-31 ENCOUNTER — Telehealth: Payer: Self-pay

## 2023-05-31 ENCOUNTER — Other Ambulatory Visit (HOSPITAL_COMMUNITY): Payer: Self-pay

## 2023-05-31 ENCOUNTER — Other Ambulatory Visit: Payer: Self-pay | Admitting: Neurology

## 2023-05-31 ENCOUNTER — Telehealth: Payer: Self-pay | Admitting: *Deleted

## 2023-05-31 DIAGNOSIS — G43009 Migraine without aura, not intractable, without status migrainosus: Secondary | ICD-10-CM

## 2023-05-31 MED ORDER — NURTEC 75 MG PO TBDP: 75.0000 mg | ORAL_TABLET | Freq: Every day | ORAL | 11 refills | Status: DC | PRN

## 2023-05-31 NOTE — Telephone Encounter (Signed)
 Pharmacy Patient Advocate Encounter  Received notification from [facility] that Prior Authorization for Nurtec 75MG  dispersible tablets has been APPROVED from 05/31/2023 to 11/27/2023. Ran test claim, Copay is $0. This test claim was processed through [facility]- copay amounts may vary at other pharmacies due to pharmacy/plan contracts, or as the patient moves through the different stages of their insurance plan.   PA #/Case ID/Reference #: PA Case ID #: [id number]

## 2023-05-31 NOTE — Telephone Encounter (Signed)
 Pharmacy Patient Advocate Encounter   Received notification from Physician's Office that prior authorization for Nurtec 75MG  dispersible tablets is required/requested.   Insurance verification completed.   The patient is insured through [facility] .   Per test claim: PA required; PA submitted to above mentioned insurance via CoverMyMeds Key/confirmation #/EOC [id number] Status is pending

## 2023-05-31 NOTE — Telephone Encounter (Signed)
 Pt waiting to get Nurtec. She thinks PA likely needed. Can you run test claim and see if PA needed? Previously tried Ubrelvy, Fioricet, triptans contraindicated as well d/t stroke history.

## 2023-05-31 NOTE — Telephone Encounter (Signed)
 Awesome, thanks.

## 2023-05-31 NOTE — Telephone Encounter (Signed)
 [facility] [place] [facility] per pt request. PA request sent to PA team.

## 2023-06-17 ENCOUNTER — Ambulatory Visit: Payer: Medicare Other | Admitting: Adult Health

## 2023-07-13 ENCOUNTER — Ambulatory Visit (INDEPENDENT_AMBULATORY_CARE_PROVIDER_SITE_OTHER): Payer: Medicare Other | Admitting: Podiatry

## 2023-07-13 DIAGNOSIS — M79674 Pain in right toe(s): Secondary | ICD-10-CM | POA: Diagnosis not present

## 2023-07-13 DIAGNOSIS — M79675 Pain in left toe(s): Secondary | ICD-10-CM | POA: Diagnosis not present

## 2023-07-13 DIAGNOSIS — B351 Tinea unguium: Secondary | ICD-10-CM | POA: Diagnosis not present

## 2023-07-13 DIAGNOSIS — E1142 Type 2 diabetes mellitus with diabetic polyneuropathy: Secondary | ICD-10-CM

## 2023-07-13 NOTE — Progress Notes (Signed)
 Subjective:  Patient ID: Brandy Flowers, female    DOB: July 30, 1940,  MRN: [id number]  Brandy Flowers presents to clinic today for:  Chief Complaint  Patient presents with   [facility]    [facility] with out callous. Last A1c was 6.8 and takes ASA 81. She has a migraine, I have lights off while she waits, the computer is on so it is not total dark.    Patient notes nails are thick, discolored, elongated and painful in shoegear when trying to ambulate.  She uses a walker to assist with ambulation.  She states that she was having a very bad migraine today.  The lights were off in the room when I entered.  She sees neurology for her migraine.  She is requesting assistance with her peripheral neuropathy from her diabetes.  She states the burning has been very bad over the past couple of years and getting worse.  She stated that her neurologist will need a new referral to address the foot neuropathy.  She is requesting referral be placed today.  She notes that she has many intolerances to medications and food dyes.  PCP is [name], Grace Laura, NP.  Past Medical History:  Diagnosis Date   Anxiety    Anxiety state 12/17/2017   Arthritis    Cerebrovascular accident (CVA) (HCC) 07/20/2021   Chronic daily headache 04/06/2021   Chronic intractable headache 04/09/2021   COPD with exacerbation (HCC)    Diabetes mellitus without complication (HCC)    no meds, diet controlled - type 2   Diastolic dysfunction    Grade 1   Dysphagia, post-stroke    Essential (primary) hypertension 07/20/2021   Hyperlipidemia    Hypertension    Hypokalemia    Hyponatremia    Hypothyroid 09/20/2013   Hypothyroidism    Labile blood pressure    Left renal artery stenosis (HCC) 12/05/2014   Lipid disorder 07/20/2021   Migraine without aura and without status migrainosus, not intractable 01/12/2021   Migraines    New onset headache 04/06/2021   Oropharyngeal dysphagia    Renal artery stenosis (HCC)    Left 50%   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   Sigmoid diverticulosis    Sinus problem    Splenic artery aneurysm (HCC)    7mm   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   Statin not tolerated 10/08/2019   Stroke (cerebrum) (HCC) 12/17/2017   Stroke (HCC) 12/16/2017   Thyroid disease    Type 2 diabetes, controlled, 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   Past Surgical History:  Procedure Laterality Date   BREAST REDUCTION SURGERY  2007   CATARACT EXTRACTION Bilateral    CHOLECYSTECTOMY     COLONOSCOPY     RADIOLOGY WITH ANESTHESIA N/A 05/14/2021   Procedure: MRI BRAIN WITH AND WITHOUT CONTRAST WITH ANESTHESIA;  Surgeon: Radiologist, Medication, MD;  Location: [facility] OR;  Service: Radiology;  Laterality: N/A;   RADIOLOGY WITH ANESTHESIA N/A 05/14/2021   Procedure: MRV WITHOUT CONTRAST WITH ANESTHESIA;  Surgeon: Radiologist, Medication, MD;  Location: [facility] OR;  Service: Radiology;  Laterality: N/A;   TONSILLECTOMY AND ADENOIDECTOMY     TOOTH EXTRACTION  2019   with anesthesia   WISDOM TOOTH EXTRACTION     Allergies  Allergen Reactions   Amiloride Shortness Of Breath   Blue Dyes (Parenteral) Shortness Of Breath, Rash and Other (See Comments)    Joint pain   Inderal La [Propranolol Hcl] Shortness Of Breath   Other Other (See Comments), Rash and Shortness Of  Breath    Joint pain   Sulfa Antibiotics Anaphylaxis   Ace Inhibitors Nausea And Vomiting   Norvasc [Amlodipine] Nausea And Vomiting    Taking at this time. No problems   Calcium Channel Blockers Diarrhea   Catapres  [Clonidine  Hcl]     Decreased mental clarity    Clonidine  Derivatives Other (See Comments)    Headache, flu like symptoms   Diovan [Valsartan] Cough   Hydralazine  Other (See Comments)    Headache   Metformin And Related Diarrhea   Micardis [Telmisartan] Other (See Comments)    Hallucinations, increase in BP   Minoxidil Other (See Comments)    Fast heart rate, chest pain   Olmesartan Swelling    "Made me very ill to include lip  swelling"    Plavix  [Clopidogrel ]     Increased LFT's    Serotonin Reuptake Inhibitors (Ssris) Other (See Comments)    Headache   Sulfur    Topamax  [Topiramate ] Other (See Comments)    Confusion   Tramadol      Nausea as a dog per pt   Tylenol  With Codeine #3 [Acetaminophen -Codeine] Other (See Comments)    Lightheaded   Zetia  [Ezetimibe ]     Liver  Function increase labs   Amoxicillin Diarrhea   Chlorthalidone     Dizziness.    Doxycycline Diarrhea   Fosamax [Alendronate Sodium]     Pt's teeth fell out while taking.    Lexapro [Escitalopram]     Brain Fog    Spironolactone Palpitations   Statins     Upset stomach     Review of Systems: Negative except as noted in the HPI.  Objective:  Brandy Flowers is a pleasant 83 y.o. female in NAD. AAO x 3.  Vascular Examination: Capillary refill time is 3-5 seconds to toes bilateral. Palpable pedal pulses b/l LE. Digital hair present b/l.  Skin temperature gradient WNL b/l. No varicosities b/l. No cyanosis noted b/l.   Dermatological Examination: Pedal skin with normal turgor, texture and tone b/l. No open wounds. No interdigital macerations b/l. Toenails x10 are 3mm thick, discolored, dystrophic with subungual debris. There is pain with compression of the nail plates.  They are elongated x10  Neurological examination: Protective sensation is intact using a Semmes Weinstein monofilament.  Vibratory sensation is diminished bilateral forefoot.  Manual muscle testing 5/5 bilateral  Assessment/Plan: 1. Diabetic peripheral neuropathy (HCC)   2. Pain due to onychomycosis of toenails of both feet    AMB REFERRAL TO NEUROLOGY-informed patient that the referral will be placed for neurology today.  She provided her current neurologist information so we can refer to the same facility.  This would be a specialized referral for her peripheral neuropathy.  The mycotic toenails were sharply debrided x10 with sterile nail nippers and a power  debriding burr to decrease bulk/thickness and length.    Return in about 3 months (around 10/13/2023) for [facility].   Joe Murders, DPM, FACFAS [facility]     [address] [place], [place] [address]                Office [phone]  Fax [phone]

## 2023-07-21 ENCOUNTER — Encounter: Payer: Self-pay | Admitting: Cardiology

## 2023-07-21 ENCOUNTER — Other Ambulatory Visit (HOSPITAL_COMMUNITY): Payer: Self-pay

## 2023-07-21 ENCOUNTER — Telehealth: Payer: Self-pay | Admitting: Pharmacy Technician

## 2023-07-21 NOTE — Telephone Encounter (Signed)
 Pharmacy Patient Advocate Encounter   Received notification from [employer] that prior authorization for Repatha  SureClick 140MG /ML auto-injectors is required/requested.   Insurance verification completed.   The patient is insured through [facility] .   Per test claim: PA required; PA submitted to above mentioned insurance via CoverMyMeds Key/confirmation #/EOC  [facility] Status is pending

## 2023-07-22 ENCOUNTER — Other Ambulatory Visit (HOSPITAL_COMMUNITY): Payer: Self-pay

## 2023-07-22 NOTE — Telephone Encounter (Signed)
 Pharmacy Patient Advocate Encounter  Received notification from [facility] that Prior Authorization for Repatha  has been APPROVED from 06/21/23 to 07/20/24. Unable to obtain price due to refill too soon rejection, last fill date 05/30/23 next available fill date06/16/25   PA #/Case ID/Reference #: [id number]

## 2023-07-23 ENCOUNTER — Encounter: Payer: Self-pay | Admitting: Neurology

## 2023-07-25 NOTE — Telephone Encounter (Signed)
 If her headaches are getting worse. She may need to come in for an office visit with Dr. Tresia Fruit to discuss treatment options.

## 2023-07-25 NOTE — Telephone Encounter (Signed)
 Spoke to patient offered a sooner appointment with Dr Tresia Fruit this week Pt declined Pt states will wait until appointment with [facility] June 23,2025 to discuss increase in headache frequency. Pt states stop taking nurtec and nausea  stopped . Pt thanked me for calling

## 2023-08-08 ENCOUNTER — Ambulatory Visit (INDEPENDENT_AMBULATORY_CARE_PROVIDER_SITE_OTHER): Admitting: Adult Health

## 2023-08-08 ENCOUNTER — Telehealth: Payer: Self-pay | Admitting: *Deleted

## 2023-08-08 ENCOUNTER — Encounter: Payer: Self-pay | Admitting: Adult Health

## 2023-08-08 VITALS — BP 181/82 | HR 73 | Ht 61.5 in | Wt 97.6 lb

## 2023-08-08 DIAGNOSIS — G43709 Chronic migraine without aura, not intractable, without status migrainosus: Secondary | ICD-10-CM

## 2023-08-08 MED ORDER — ONABOTULINUMTOXINA 200 UNITS IJ SOLR
155.0000 [IU] | Freq: Once | INTRAMUSCULAR | Status: AC
  Administered 2023-08-08: 155 [IU] via INTRAMUSCULAR

## 2023-08-08 NOTE — Progress Notes (Addendum)
 08/08/23: Patient last received Botox  in March by Dr. Ines.  She does feel that is beneficial.  She states in the last 2 weeks it has worn off.  She has been having daily dull headaches.  Only 1-2 severe migraines a week.  Prior to the last 2 weeks she was only having 1-2 headaches a week.  She does use Nurtec and it resolves the headache but it does calls nausea.  She also gets dizzy with her headaches.  She has seen ENT that ruled out other potential causes.  She returns today for Botox  injections.  She does report that she has an appointment in October with Dr. Darleen to discuss neuropathy.  Patient blood pressure is elevated.  She did take her medicine today.  She thinks her blood pressure is up because she has a headache.  She plans to recheck it at home.  I did advise that if it remains elevated she should make her PCP aware.   01/11/23:  botox  works for the first 1-2 months and she will start have breakthrough headaches.  She reports that this cycle her headaches were worse.  She states in the past she was on Emgality . She only took this for a couple of months because she was given samples.  She feels that the combination of Emgality  and Botox  worked well.  Her primary care gave her Fioricet and that works well to resolve her headaches typically.  I did caution her about rebound headaches.  Patient states that she did have a fall on Sunday.  She states that she did not hit her head.  Her brother who is an Charity fundraiser came to check on her.  She has not had any additional symptoms since the fall.  She does notice that she has a bruise today on the right hand.  She is not sure if that is from the fall or from something else.  Did advise that if the bruise worsen she should see her primary care provider.   09/30/22: Reports that botox  has been helping. Down to about 2 headaches a week. Severity is better. Has had a daily headache for the last 2 weeks. Most likely d/t botox  wearing off.   06/30/22: First botox   since she received Sample from Dr. Ines    BOTOX  PROCEDURE NOTE FOR MIGRAINE HEADACHE    Contraindications and precautions discussed with patient(above). Aseptic procedure was observed and patient tolerated procedure. Procedure performed by Duwaine Russell, NP  The condition has existed for more than 6 months, and pt does not have a diagnosis of ALS, Myasthenia Gravis or Lambert-Eaton Syndrome.  Risks and benefits of injections discussed and pt agrees to proceed with the procedure.  Written consent obtained  These injections are medically necessary.  These injections do not cause sedations or hallucinations which the oral therapies may cause.  Indication/Diagnosis: chronic migraine BOTOX (G9414) injection was performed according to protocol by [employer]. 200 units of BOTOX  was dissolved into 4 cc NS.   NDC: [address]-98  Type of toxin: Botox    Botox - 200 units x 1 vial Lot: [id number] Expiration: 11/2025 NDC: 9976-6078-97   Bacteriostatic 0.9% Sodium Chloride - 4mL total Lot:  [id number] Expiratin: 12/15/2024 NDC: 9590-8033-97   Dx: H56.290         Description of procedure:  The patient was placed in a sitting position. The standard protocol was used for Botox  as follows, with 5 units of Botox  injected at each site:   -Procerus muscle, midline injection  -Corrugator muscle, bilateral  injection  -Frontalis muscle, bilateral injection, with 2 sites each side, medial injection was performed in the upper one third of the frontalis muscle, in the region vertical from the medial inferior edge of the superior orbital rim. The lateral injection was again in the upper one third of the forehead vertically above the lateral limbus of the cornea, 1.5 cm lateral to the medial injection site.  -Temporalis muscle injection, 4 sites, bilaterally. The first injection was 3 cm above the tragus of the ear, second injection site was 1.5 cm to 3 cm up from the first injection site in line with the  tragus of the ear. The third injection site was 1.5-3 cm forward between the first 2 injection sites. The fourth injection site was 1.5 cm posterior to the second injection site.  -Occipitalis muscle injection, 3 sites, bilaterally. The first injection was done one half way between the occipital protuberance and the tip of the mastoid process behind the ear. The second injection site was done lateral and superior to the first, 1 fingerbreadth from the first injection. The third injection site was 1 fingerbreadth superiorly and medially from the first injection site.  -Cervical paraspinal muscle injection, 2 sites, bilateral knee first injection site was 1 cm from the midline of the cervical spine, 3 cm inferior to the lower border of the occipital protuberance. The second injection site was 1.5 cm superiorly and laterally to the first injection site.  -Trapezius muscle injection was performed at 3 sites, bilaterally. The first injection site was in the upper trapezius muscle halfway between the inflection point of the neck, and the acromion. The second injection site was one half way between the acromion and the first injection site. The third injection was done between the first injection site and the inflection point of the neck.   Will return for repeat injection in 3 months.   A 200 unit sof Botox  was used, 155 units were injected, the rest of the Botox  was wasted. The patient tolerated the procedure well, there were no complications of the above procedure.  Duwaine Russell, MSN, NP-C 08/08/2023, 11:05 AM [facility] [address], [address] [place], [place] [address] [phone]

## 2023-08-08 NOTE — Progress Notes (Signed)
 Pt left, I was not aware.  Was not able to repeat Bp.

## 2023-08-08 NOTE — Progress Notes (Signed)
  Botox - 200 units x 1 vial Lot: [id number] Expiration: 11/2025 NDC: 9976-6078-97   Bacteriostatic 0.9% Sodium Chloride - 4mL total Lot:  [id number] Expiratin: 12/15/2024 NDC: 9590-8033-97   Dx: H56.290    BB Witnessed by :  Heather MOTE RN

## 2023-08-08 NOTE — Telephone Encounter (Signed)
 Pt here for botox ,  Bp was elevated was not able to repeat as pt left.  I have contacted her to repeat at home.

## 2023-08-28 ENCOUNTER — Encounter: Payer: Self-pay | Admitting: Cardiology

## 2023-09-12 ENCOUNTER — Other Ambulatory Visit: Payer: Self-pay | Admitting: Cardiology

## 2023-09-14 ENCOUNTER — Encounter: Payer: Self-pay | Admitting: Neurology

## 2023-09-18 ENCOUNTER — Encounter: Payer: Self-pay | Admitting: Cardiology

## 2023-09-19 ENCOUNTER — Encounter: Payer: Self-pay | Admitting: Neurology

## 2023-10-03 ENCOUNTER — Encounter: Payer: Self-pay | Admitting: Neurology

## 2023-10-06 ENCOUNTER — Ambulatory Visit: Payer: Medicare Other | Admitting: Behavioral Health

## 2023-10-12 ENCOUNTER — Ambulatory Visit: Admitting: Podiatry

## 2023-10-13 ENCOUNTER — Ambulatory Visit (INDEPENDENT_AMBULATORY_CARE_PROVIDER_SITE_OTHER): Admitting: Podiatry

## 2023-10-13 DIAGNOSIS — B351 Tinea unguium: Secondary | ICD-10-CM | POA: Diagnosis not present

## 2023-10-13 DIAGNOSIS — M79675 Pain in left toe(s): Secondary | ICD-10-CM | POA: Diagnosis not present

## 2023-10-13 DIAGNOSIS — M79674 Pain in right toe(s): Secondary | ICD-10-CM

## 2023-10-13 NOTE — Progress Notes (Signed)
 Subjective:  Patient ID: Brandy Flowers, female    DOB: [birth year],  MRN: [id number]  Brandy Flowers presents to clinic today for:  Chief Complaint  Patient presents with   [facility]    [facility] with callous A1c 6.2 in June ASA   Patient notes nails are thick, discolored, elongated and painful in shoegear when trying to ambulate.  She has some callus tissue near the tip of the left fourth toe.  PCP is [name], Elston NOVAK, NP.  Past Medical History:  Diagnosis Date   Anxiety    Anxiety state 12/17/2017   Arthritis    Cerebrovascular accident (CVA) (HCC) 07/20/2021   Chronic daily headache 04/06/2021   Chronic intractable headache 04/09/2021   COPD with exacerbation (HCC)    Diabetes mellitus without complication (HCC)    no meds, diet controlled - type 2   Diastolic dysfunction    Grade 1   Dysphagia, post-stroke    Essential (primary) hypertension 07/20/2021   Hyperlipidemia    Hypertension    Hypokalemia    Hyponatremia    Hypothyroid 09/20/2013   Hypothyroidism    Labile blood pressure    Left renal artery stenosis (HCC) 12/05/2014   Lipid disorder 07/20/2021   Migraine without aura and without status migrainosus, not intractable 01/12/2021   Migraines    New onset headache 04/06/2021   Oropharyngeal dysphagia    Renal artery stenosis (HCC)    Left 50%   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   Sigmoid diverticulosis    Sinus problem    Splenic artery aneurysm (HCC)    7mm   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   Statin not tolerated 10/08/2019   Stroke (cerebrum) (HCC) 12/17/2017   Stroke (HCC) 12/16/2017   Thyroid disease    Type 2 diabetes, controlled, 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   Past Surgical History:  Procedure Laterality Date   BREAST REDUCTION SURGERY  2007   CATARACT EXTRACTION Bilateral    CHOLECYSTECTOMY     COLONOSCOPY     RADIOLOGY WITH ANESTHESIA N/A 05/14/2021   Procedure: MRI BRAIN WITH AND WITHOUT CONTRAST WITH ANESTHESIA;   Surgeon: Radiologist, Medication, MD;  Location: [facility] OR;  Service: Radiology;  Laterality: N/A;   RADIOLOGY WITH ANESTHESIA N/A 05/14/2021   Procedure: MRV WITHOUT CONTRAST WITH ANESTHESIA;  Surgeon: Radiologist, Medication, MD;  Location: [facility] OR;  Service: Radiology;  Laterality: N/A;   TONSILLECTOMY AND ADENOIDECTOMY     TOOTH EXTRACTION  2019   with anesthesia   WISDOM TOOTH EXTRACTION     Allergies  Allergen Reactions   Amiloride Shortness Of Breath   Blue Dyes (Parenteral) Shortness Of Breath, Rash and Other (See Comments)    Joint pain   Inderal La [Propranolol Hcl] Shortness Of Breath   Other Other (See Comments), Rash and Shortness Of Breath    Joint pain   Sulfa Antibiotics Anaphylaxis   Ace Inhibitors Nausea And Vomiting   Norvasc [Amlodipine] Nausea And Vomiting    Taking at this time. No problems   Calcium Channel Blockers Diarrhea   Catapres  [Clonidine  Hcl]     Decreased mental clarity    Clonidine  Derivatives Other (See Comments)    Headache, flu like symptoms   Diovan [Valsartan] Cough   Hydralazine  Other (See Comments)    Headache   Metformin And Related Diarrhea   Micardis [Telmisartan] Other (See Comments)    Hallucinations, increase in BP   Minoxidil Other (See Comments)    Fast heart rate, chest  pain   Olmesartan Swelling    Made me very ill to include lip swelling    Plavix  [Clopidogrel ]     Increased LFT's    Serotonin Reuptake Inhibitors (Ssris) Other (See Comments)    Headache   Sulfur    Topamax  [Topiramate ] Other (See Comments)    Confusion   Tramadol      Nausea as a dog per pt   Tylenol  With Codeine #3 [Acetaminophen -Codeine] Other (See Comments)    Lightheaded   Zetia  [Ezetimibe ]     Liver  Function increase labs   Amoxicillin Diarrhea   Chlorthalidone     Dizziness.    Doxycycline Diarrhea   Fosamax [Alendronate Sodium]     Pt's teeth fell out while taking.    Lexapro [Escitalopram]     Brain Fog    Spironolactone Palpitations    Statins     Upset stomach     Review of Systems: Negative except as noted in the HPI.  Objective:  Brandy Flowers is a pleasant 83 y.o. female in NAD. AAO x 3.  Vascular Examination: Capillary refill time is 3-5 seconds to toes bilateral. Palpable pedal pulses b/l LE. Digital hair present b/l.  Skin temperature gradient WNL b/l. No varicosities b/l. No cyanosis noted b/l.   Dermatological Examination: Pedal skin with normal turgor, texture and tone b/l. No open wounds. No interdigital macerations b/l. Toenails x10 are 3mm thick, discolored, dystrophic with subungual debris. There is pain with compression of the nail plates.  They are elongated x10.  There is a hyperkeratotic lesion on the distal medial aspect of the left fourth toe.  Assessment/Plan: 1. Pain due to onychomycosis of toenails of both feet    The mycotic toenails were sharply debrided x10 with sterile nail nippers and a power debriding burr to decrease bulk/thickness and length.    The pinch callus on the pulp of the left fourth toe was shaved with a sterile #[id number] blade uneventfully as a courtesy.  Return in about 3 months (around 01/13/2024) for [facility].   Awanda CHARM Imperial, DPM, FACFAS [facility]     [address] [place], [place] [address]                Office [phone]  Fax [phone]

## 2023-10-15 ENCOUNTER — Encounter: Payer: Self-pay | Admitting: Neurology

## 2023-10-15 ENCOUNTER — Encounter: Payer: Self-pay | Admitting: Podiatry

## 2023-10-25 ENCOUNTER — Telehealth: Payer: Self-pay | Admitting: Neurology

## 2023-10-25 NOTE — Telephone Encounter (Signed)
 Patient called to confirm appointment for 11/01/23.

## 2023-11-01 ENCOUNTER — Encounter: Payer: Self-pay | Admitting: Neurology

## 2023-11-01 ENCOUNTER — Ambulatory Visit (INDEPENDENT_AMBULATORY_CARE_PROVIDER_SITE_OTHER): Admitting: Adult Health

## 2023-11-01 VITALS — BP 130/82 | HR 61

## 2023-11-01 DIAGNOSIS — G43709 Chronic migraine without aura, not intractable, without status migrainosus: Secondary | ICD-10-CM

## 2023-11-01 MED ORDER — ONABOTULINUMTOXINA 200 UNITS IJ SOLR
155.0000 [IU] | Freq: Once | INTRAMUSCULAR | Status: AC
  Administered 2023-11-01: 155 [IU] via INTRAMUSCULAR

## 2023-11-01 NOTE — Progress Notes (Signed)
 Botox -200 units x 1 vial Lot: [id number] Expiration: 12/2025 NDC: 9976-6078-97  Bacteriostatic 0.9% Sodium Chloride - 4 mL  Lot: [id number] Expiration: 11/2024 NDC: 9590-8033-97  Dx: H56.290 B/B Witnessed by Particia PEAK

## 2023-11-01 NOTE — Progress Notes (Signed)
 11/01/23: Botox  was working well but as it wore off headaches have come back. She states that she hs stopped repatha  and had withdrawal symptoms but she has now been off 77 days. She was advised that due to her insurance we will not be able to continue botox  through our office. She will discuss with Dr. Ines in October.  08/08/23: Patient last received Botox  in March by Dr. Ines.  She does feel that is beneficial.  She states in the last 2 weeks it has worn off.  She has been having daily dull headaches.  Only 1-2 severe migraines a week.  Prior to the last 2 weeks she was only having 1-2 headaches a week.  She does use Nurtec and it resolves the headache but it does calls nausea.  She also gets dizzy with her headaches.  She has seen ENT that ruled out other potential causes.  She returns today for Botox  injections.  She does report that she has an appointment in October with Dr. Darleen to discuss neuropathy.  Patient blood pressure is elevated.  She did take her medicine today.  She thinks her blood pressure is up because she has a headache.  She plans to recheck it at home.  I did advise that if it remains elevated she should make her PCP aware.   01/11/23:  botox  works for the first 1-2 months and she will start have breakthrough headaches.  She reports that this cycle her headaches were worse.  She states in the past she was on Emgality . She only took this for a couple of months because she was given samples.  She feels that the combination of Emgality  and Botox  worked well.  Her primary care gave her Fioricet and that works well to resolve her headaches typically.  I did caution her about rebound headaches.  Patient states that she did have a fall on Sunday.  She states that she did not hit her head.  Her brother who is an Char[employer] fundraiser came to check on her.  She has not had any additional symptoms since the fall.  She does notice that she has a bruise today on the right hand.  She is not sure if that is from  the fall or from something else.  Did advise that if the bruise worsen she should see her primary care provider.   09/30/22: Reports that botox  has been helping. Down to about 2 headaches a week. Severity is better. Has had a daily headache for the last 2 weeks. Most likely d/t botox  wearing off.   06/30/22: First botox  since she received Sample from Dr. Ines    BOTOX  PROCEDURE NOTE FOR MIGRAINE HEADACHE    Contraindications and precautions discussed with patient(above). Aseptic procedure was observed and patient tolerated procedure. Procedure performed by Duwaine Russell, NP  The condition has existed for more than 6 months, and pt does not have a diagnosis of ALS, Myasthenia Gravis or Lambert-Eaton Syndrome.  Risks and benefits of injections discussed and pt agrees to proceed with the procedure.  Written consent obtained  These injections are medically necessary.  These injections do not cause sedations or hallucinations which the oral therapies may cause.  Indication/Diagnosis: chronic migraine BOTOX (G9414) injection was performed according to protocol by [employer]. 200 units of BOTOX  was dissolved into 4 cc NS.   NDC: [address]-98  Type of toxin: Botox   Botox -200 units x 1 vial Lot: [id number] Expiration: 12/2025 NDC: 9976-6078-97   Bacteriostatic 0.9% Sodium Chloride - 4 mL  Lot: [id number] Expiration: 11/2024 NDC: 9590-8033-97   Dx: [email or web address]           Description of procedure:  The patient was placed in a sitting position. The standard protocol was used for Botox  as follows, with 5 units of Botox  injected at each site:   -Procerus muscle, midline injection  -Corrugator muscle, bilateral injection  -Frontalis muscle, bilateral injection, with 2 sites each side, medial injection was performed in the upper one third of the frontalis muscle, in the region vertical from the medial inferior edge of the superior orbital rim. The lateral injection was again in the upper one third  of the forehead vertically above the lateral limbus of the cornea, 1.5 cm lateral to the medial injection site.  -Temporalis muscle injection, 4 sites, bilaterally. The first injection was 3 cm above the tragus of the ear, second injection site was 1.5 cm to 3 cm up from the first injection site in line with the tragus of the ear. The third injection site was 1.5-3 cm forward between the first 2 injection sites. The fourth injection site was 1.5 cm posterior to the second injection site.  -Occipitalis muscle injection, 3 sites, bilaterally. The first injection was done one half way between the occipital protuberance and the tip of the mastoid process behind the ear. The second injection site was done lateral and superior to the first, 1 fingerbreadth from the first injection. The third injection site was 1 fingerbreadth superiorly and medially from the first injection site.  -Cervical paraspinal muscle injection, 2 sites, bilateral knee first injection site was 1 cm from the midline of the cervical spine, 3 cm inferior to the lower border of the occipital protuberance. The second injection site was 1.5 cm superiorly and laterally to the first injection site.  -Trapezius muscle injection was performed at 3 sites, bilaterally. The first injection site was in the upper trapezius muscle halfway between the inflection point of the neck, and the acromion. The second injection site was one half way between the acromion and the first injection site. The third injection was done between the first injection site and the inflection point of the neck.   Will return for repeat injection in 3 months.   A 200 unit sof Botox  was used, 155 units were injected, the rest of the Botox  was wasted. The patient tolerated the procedure well, there were no complications of the above procedure.  Duwaine Russell, MSN, NP-C 11/01/2023, 10:09 AM [facility] [address], [address] [place], [place]  [address] [phone]

## 2023-11-03 ENCOUNTER — Other Ambulatory Visit (HOSPITAL_COMMUNITY): Payer: Self-pay

## 2023-11-03 ENCOUNTER — Telehealth: Payer: Self-pay

## 2023-11-03 NOTE — Telephone Encounter (Signed)
 Pharmacy Patient Advocate Encounter  Received notification from [facility] that Prior Authorization for Nurtec has been APPROVED from 11/03/2023 to 11/02/2024   PA #/Case ID/Reference #: [id number]  KEY:[id number]

## 2023-11-16 ENCOUNTER — Telehealth: Payer: Self-pay | Admitting: Neurology

## 2023-11-16 ENCOUNTER — Encounter: Payer: Self-pay | Admitting: Neurology

## 2023-11-16 NOTE — Telephone Encounter (Signed)
 Pt called to Cancel appt due to  wanting to see Dr, Ines   Appt Cancel

## 2023-11-17 ENCOUNTER — Encounter: Payer: Self-pay | Admitting: Adult Health

## 2023-11-21 ENCOUNTER — Institutional Professional Consult (permissible substitution): Admitting: Neurology

## 2023-11-30 ENCOUNTER — Encounter: Payer: Self-pay | Admitting: Adult Health

## 2023-12-04 ENCOUNTER — Encounter: Payer: Self-pay | Admitting: Adult Health

## 2023-12-12 ENCOUNTER — Other Ambulatory Visit: Payer: Self-pay | Admitting: Adult Health

## 2023-12-12 MED ORDER — UBRELVY 100 MG PO TABS: ORAL_TABLET | ORAL | 0 refills | Status: DC

## 2023-12-15 ENCOUNTER — Encounter: Payer: Self-pay | Admitting: Podiatry

## 2023-12-19 NOTE — Telephone Encounter (Signed)
 I called pt and she cannot come at 0900 its too early, plus her driver picks up children at that time and cannot come then.  She will see how she does in several days.  I relayed that this was a work in optometrist for [employer] NP to see what is going on.  I encouraged her to come in.  I told her that I could not keep the appt held for 2 days.  She said to cancel the hold.  I did.  I did relay that she was reassigned to Dr. Chalice.  She appreciated call back.

## 2023-12-19 NOTE — Telephone Encounter (Signed)
 Can we get her set up with new MD since Dr. Ines is no longer here. Lets go ahead and schedule OV

## 2023-12-21 ENCOUNTER — Encounter: Payer: Self-pay | Admitting: Podiatry

## 2023-12-25 ENCOUNTER — Other Ambulatory Visit: Payer: Self-pay | Admitting: Behavioral Health

## 2023-12-25 DIAGNOSIS — F411 Generalized anxiety disorder: Secondary | ICD-10-CM

## 2023-12-25 NOTE — Telephone Encounter (Signed)
 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  Please consider follow up care through a pain specialist.   Dedra Gores, MD

## 2023-12-26 MED ORDER — NURTEC 75 MG PO TBDP: ORAL_TABLET | ORAL | 1 refills | Status: AC

## 2023-12-26 NOTE — Telephone Encounter (Signed)
 Sent MyChart visit to schedule FU.

## 2023-12-26 NOTE — Telephone Encounter (Signed)
 E-Prescribing Status: Transmission to pharmacy failed (12/25/2023  3:15 PM EST)

## 2023-12-27 NOTE — Telephone Encounter (Signed)
 Please call to schedule FU, did not respond to MyChart message.

## 2023-12-28 NOTE — Telephone Encounter (Signed)
 Pt LVM @ 8:26p that she wouldn't be coming back to see Redell because her PCP is providing her Ativan .

## 2024-01-10 ENCOUNTER — Telehealth: Payer: Self-pay | Admitting: Adult Health

## 2024-01-10 NOTE — Telephone Encounter (Signed)
 Pt called wanting to cx her appt because she states that it is not doing her any good.

## 2024-01-18 ENCOUNTER — Ambulatory Visit: Admitting: Podiatry

## 2024-01-25 ENCOUNTER — Ambulatory Visit: Admitting: Adult Health

## 2024-01-26 ENCOUNTER — Ambulatory Visit: Admitting: Adult Health

## 2024-02-01 ENCOUNTER — Other Ambulatory Visit: Payer: Self-pay | Admitting: *Deleted

## 2024-02-20 ENCOUNTER — Ambulatory Visit: Admitting: Podiatry

## 2024-02-29 ENCOUNTER — Ambulatory Visit: Admitting: Podiatry

## 2024-02-29 DIAGNOSIS — M79675 Pain in left toe(s): Secondary | ICD-10-CM

## 2024-02-29 DIAGNOSIS — B351 Tinea unguium: Secondary | ICD-10-CM

## 2024-02-29 DIAGNOSIS — M79674 Pain in right toe(s): Secondary | ICD-10-CM

## 2024-02-29 NOTE — Progress Notes (Signed)
 "     Subjective:  Patient ID: Brandy Flowers, female    DOB: 16-May-1940,  MRN: [id number]  Zenaida Tesar presents to clinic today for:  Chief Complaint  Patient presents with   [facility]    [facility] no callus A1c unknown ASA   Patient notes nails are thick, discolored, elongated and painful in shoegear when trying to ambulate.    PCP is Silvano Angeline FALCON, NP.  Past Medical History:  Diagnosis Date   Anxiety    Anxiety state 12/17/2017   Arthritis    Cerebrovascular accident (CVA) (HCC) 07/20/2021   Chronic daily headache 04/06/2021   Chronic intractable headache 04/09/2021   COPD with exacerbation (HCC)    Diabetes mellitus without complication (HCC)    no meds, diet controlled - type 2   Diastolic dysfunction    Grade 1   Dysphagia, post-stroke    Essential (primary) hypertension 07/20/2021   Hyperlipidemia    Hypertension    Hypokalemia    Hyponatremia    Hypothyroid 09/20/2013   Hypothyroidism    Labile blood pressure    Left renal artery stenosis 12/05/2014   Lipid disorder 07/20/2021   Migraine without aura and without status migrainosus, not intractable 01/12/2021   Migraines    New onset headache 04/06/2021   Oropharyngeal dysphagia    Renal artery stenosis    Left 50%   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   Sigmoid diverticulosis    Sinus problem    Splenic artery aneurysm    7mm   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   Statin not tolerated 10/08/2019   Stroke (cerebrum) (HCC) 12/17/2017   Stroke (HCC) 12/16/2017   Thyroid disease    Type 2 diabetes, controlled, 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   Past Surgical History:  Procedure Laterality Date   BREAST REDUCTION SURGERY  2007   CATARACT EXTRACTION Bilateral    CHOLECYSTECTOMY     COLONOSCOPY     RADIOLOGY WITH ANESTHESIA N/A 05/14/2021   Procedure: MRI BRAIN WITH AND WITHOUT CONTRAST WITH ANESTHESIA;  Surgeon: Radiologist, Medication, MD;  Location: [facility] OR;  Service: Radiology;  Laterality: N/A;    RADIOLOGY WITH ANESTHESIA N/A 05/14/2021   Procedure: MRV WITHOUT CONTRAST WITH ANESTHESIA;  Surgeon: Radiologist, Medication, MD;  Location: [facility] OR;  Service: Radiology;  Laterality: N/A;   TONSILLECTOMY AND ADENOIDECTOMY     TOOTH EXTRACTION  2019   with anesthesia   WISDOM TOOTH EXTRACTION     Allergies[1]  Review of Systems: Negative except as noted in the HPI.  Objective:  Sybella Harnish is a pleasant 84 y.o. female in NAD. AAO x 3.  Vascular Examination: Capillary refill time is 3-5 seconds to toes bilateral. Palpable pedal pulses b/l LE. Digital hair present b/l.  Skin temperature gradient WNL b/l. No varicosities b/l. No cyanosis noted b/l.   Dermatological Examination: Pedal skin with normal turgor, texture and tone b/l. No open wounds. No interdigital macerations b/l. Toenails x10 are 3mm thick, discolored, dystrophic with subungual debris. There is pain with compression of the nail plates.  They are elongated x10.  She has a pinch callus on the plantar medial aspect of the left fourth toe  Assessment/Plan: 1. Pain due to onychomycosis of toenails of both feet     The mycotic toenails were sharply debrided x10 with sterile nail nippers and a power debriding burr to decrease bulk/thickness and length.    The pinch callus on the left fourth toe was shaved uneventfully.  Return in about 3 months (  around 05/29/2024) for [facility].   Awanda CHARM Imperial, DPM, FACFAS [facility]     [address] [place], [place] [address]                Office [phone]  Fax [phone]    [1]  Allergies Allergen Reactions   Amiloride Shortness Of Breath   Blue Dyes (Parenteral) Shortness Of Breath, Rash and Other (See Comments)    Joint pain   Inderal La [Propranolol Hcl] Shortness Of Breath   Other Other (See Comments), Rash and Shortness Of Breath    Joint pain   Sulfa Antibiotics Anaphylaxis   Ace Inhibitors Nausea And  Vomiting   Norvasc [Amlodipine] Nausea And Vomiting    Taking at this time. No problems   Calcium Channel Blockers Diarrhea   Catapres  [Clonidine  Hcl]     Decreased mental clarity    Clonidine  And Derivatives Other (See Comments)    Headache, flu like symptoms   Diovan [Valsartan] Cough   Hydralazine  Other (See Comments)    Headache   Metformin And Related Diarrhea   Micardis [Telmisartan] Other (See Comments)    Hallucinations, increase in BP   Minoxidil Other (See Comments)    Fast heart rate, chest pain   Olmesartan Swelling    Made me very ill to include lip swelling    Plavix  [Clopidogrel ]     Increased LFT's    Repatha  [Evolocumab ]    Serotonin Reuptake Inhibitors (Ssris) Other (See Comments)    Headache   Sulfur    Topamax  [Topiramate ] Other (See Comments)    Confusion   Tramadol      Nausea as a dog per pt   Tylenol  With Codeine #3 [Acetaminophen -Codeine] Other (See Comments)    Lightheaded   Zetia  [Ezetimibe ]     Liver  Function increase labs   Amoxicillin Diarrhea   Chlorthalidone     Dizziness.    Doxycycline Diarrhea   Fosamax [Alendronate Sodium]     Pt's teeth fell out while taking.    Lexapro [Escitalopram]     Brain Fog    Spironolactone Palpitations   Statins     Upset stomach    "

## 2024-03-08 ENCOUNTER — Encounter: Payer: Self-pay | Admitting: Adult Health

## 2024-03-21 ENCOUNTER — Telehealth: Payer: Self-pay | Admitting: Behavioral Health

## 2024-03-21 NOTE — Telephone Encounter (Signed)
 Last visit was 10/06/23. Brandy Flowers's last Ativan  was 02/25/24. She is tapering down her Ativan  now. Has a little bit of a headache when she gets up but it goes away.  Her PCP is helping her with the tapering down.

## 2024-03-21 NOTE — Telephone Encounter (Signed)
 Called patient. She is not currently following with Redell. Will call if needed. No RF needed.

## 2024-05-30 ENCOUNTER — Ambulatory Visit: Admitting: Podiatry
